# Patient Record
Sex: Female | Born: 1960 | Race: White | Hispanic: No | Smoking: Former smoker
Health system: Southern US, Community
[De-identification: ages and names within clinical notes are randomized; demographics above are authoritative.]

## PROBLEM LIST (undated history)

## (undated) DIAGNOSIS — M797 Fibromyalgia: Secondary | ICD-10-CM

## (undated) DIAGNOSIS — K760 Fatty (change of) liver, not elsewhere classified: Secondary | ICD-10-CM

## (undated) DIAGNOSIS — B009 Herpesviral infection, unspecified: Secondary | ICD-10-CM

## (undated) DIAGNOSIS — G629 Polyneuropathy, unspecified: Secondary | ICD-10-CM

## (undated) DIAGNOSIS — N189 Chronic kidney disease, unspecified: Secondary | ICD-10-CM

## (undated) DIAGNOSIS — I10 Essential (primary) hypertension: Secondary | ICD-10-CM

## (undated) DIAGNOSIS — J449 Chronic obstructive pulmonary disease, unspecified: Secondary | ICD-10-CM

## (undated) DIAGNOSIS — M1990 Unspecified osteoarthritis, unspecified site: Secondary | ICD-10-CM

## (undated) DIAGNOSIS — G4733 Obstructive sleep apnea (adult) (pediatric): Secondary | ICD-10-CM

## (undated) DIAGNOSIS — K219 Gastro-esophageal reflux disease without esophagitis: Secondary | ICD-10-CM

## (undated) DIAGNOSIS — F419 Anxiety disorder, unspecified: Secondary | ICD-10-CM

## (undated) DIAGNOSIS — J45909 Unspecified asthma, uncomplicated: Secondary | ICD-10-CM

## (undated) DIAGNOSIS — J189 Pneumonia, unspecified organism: Secondary | ICD-10-CM

## (undated) DIAGNOSIS — F319 Bipolar disorder, unspecified: Secondary | ICD-10-CM

## (undated) DIAGNOSIS — F329 Major depressive disorder, single episode, unspecified: Secondary | ICD-10-CM

## (undated) DIAGNOSIS — R519 Headache, unspecified: Secondary | ICD-10-CM

## (undated) DIAGNOSIS — F32A Depression, unspecified: Secondary | ICD-10-CM

## (undated) DIAGNOSIS — R0600 Dyspnea, unspecified: Secondary | ICD-10-CM

## (undated) HISTORY — PX: TOOTH EXTRACTION: SHX859

## (undated) HISTORY — PX: HERNIA REPAIR: SHX51

## (undated) HISTORY — PX: BACK SURGERY: SHX140

## (undated) HISTORY — PX: APPENDECTOMY: SHX54

## (undated) HISTORY — PX: TONSILLECTOMY: SUR1361

## (undated) HISTORY — PX: COLONOSCOPY WITH PROPOFOL: SHX5780

## (undated) HISTORY — PX: BREAST REDUCTION SURGERY: SHX8

## (undated) HISTORY — DX: Obstructive sleep apnea (adult) (pediatric): G47.33

## (undated) HISTORY — PX: CARPAL TUNNEL RELEASE: SHX101

## (undated) HISTORY — PX: CHOLECYSTECTOMY: SHX55

## (undated) HISTORY — PX: ABDOMINAL SURGERY: SHX537

## (undated) HISTORY — PX: JOINT REPLACEMENT: SHX530

## (undated) HISTORY — PX: NECK SURGERY: SHX720

## (undated) HISTORY — PX: BREAST SURGERY: SHX581

## (undated) HISTORY — DX: Polyneuropathy, unspecified: G62.9

## (undated) HISTORY — PX: KNEE SURGERY: SHX244

## (undated) HISTORY — PX: UPPER GI ENDOSCOPY: SHX6162

---

## 2006-02-14 ENCOUNTER — Inpatient Hospital Stay (HOSPITAL_COMMUNITY): Admission: AD | Admit: 2006-02-14 | Discharge: 2006-02-14 | Payer: Self-pay | Admitting: Obstetrics and Gynecology

## 2006-11-24 ENCOUNTER — Ambulatory Visit: Payer: Self-pay | Admitting: Physical Medicine & Rehabilitation

## 2006-12-07 ENCOUNTER — Encounter
Admission: RE | Admit: 2006-12-07 | Discharge: 2007-02-16 | Payer: Self-pay | Admitting: Physical Medicine & Rehabilitation

## 2007-01-12 ENCOUNTER — Ambulatory Visit: Payer: Self-pay | Admitting: Physical Medicine & Rehabilitation

## 2011-07-28 ENCOUNTER — Emergency Department (HOSPITAL_COMMUNITY)
Admission: EM | Admit: 2011-07-28 | Discharge: 2011-07-29 | Disposition: A | Discharge status: Home / Self Care | Payer: PRIVATE HEALTH INSURANCE | Attending: Emergency Medicine | Admitting: Emergency Medicine

## 2011-07-28 DIAGNOSIS — I10 Essential (primary) hypertension: Secondary | ICD-10-CM | POA: Insufficient documentation

## 2011-07-28 DIAGNOSIS — IMO0001 Reserved for inherently not codable concepts without codable children: Secondary | ICD-10-CM | POA: Insufficient documentation

## 2011-07-28 DIAGNOSIS — M26609 Unspecified temporomandibular joint disorder, unspecified side: Secondary | ICD-10-CM | POA: Insufficient documentation

## 2011-07-28 DIAGNOSIS — R21 Rash and other nonspecific skin eruption: Secondary | ICD-10-CM | POA: Insufficient documentation

## 2011-07-28 DIAGNOSIS — R51 Headache: Secondary | ICD-10-CM | POA: Insufficient documentation

## 2012-06-18 ENCOUNTER — Encounter (HOSPITAL_COMMUNITY): Payer: Self-pay | Admitting: *Deleted

## 2012-06-18 ENCOUNTER — Emergency Department (HOSPITAL_COMMUNITY): Payer: PRIVATE HEALTH INSURANCE

## 2012-06-18 ENCOUNTER — Emergency Department (HOSPITAL_COMMUNITY)
Admission: EM | Admit: 2012-06-18 | Discharge: 2012-06-18 | Disposition: A | Discharge status: Home Health | Payer: PRIVATE HEALTH INSURANCE | Attending: Emergency Medicine | Admitting: Emergency Medicine

## 2012-06-18 DIAGNOSIS — I10 Essential (primary) hypertension: Secondary | ICD-10-CM | POA: Insufficient documentation

## 2012-06-18 DIAGNOSIS — R0602 Shortness of breath: Secondary | ICD-10-CM | POA: Insufficient documentation

## 2012-06-18 DIAGNOSIS — R110 Nausea: Secondary | ICD-10-CM | POA: Insufficient documentation

## 2012-06-18 DIAGNOSIS — F17200 Nicotine dependence, unspecified, uncomplicated: Secondary | ICD-10-CM | POA: Insufficient documentation

## 2012-06-18 DIAGNOSIS — F341 Dysthymic disorder: Secondary | ICD-10-CM | POA: Insufficient documentation

## 2012-06-18 DIAGNOSIS — R0789 Other chest pain: Secondary | ICD-10-CM

## 2012-06-18 DIAGNOSIS — IMO0001 Reserved for inherently not codable concepts without codable children: Secondary | ICD-10-CM | POA: Insufficient documentation

## 2012-06-18 DIAGNOSIS — R071 Chest pain on breathing: Secondary | ICD-10-CM | POA: Insufficient documentation

## 2012-06-18 DIAGNOSIS — Z79899 Other long term (current) drug therapy: Secondary | ICD-10-CM | POA: Insufficient documentation

## 2012-06-18 HISTORY — DX: Essential (primary) hypertension: I10

## 2012-06-18 HISTORY — DX: Bipolar disorder, unspecified: F31.9

## 2012-06-18 HISTORY — DX: Major depressive disorder, single episode, unspecified: F32.9

## 2012-06-18 HISTORY — DX: Fibromyalgia: M79.7

## 2012-06-18 HISTORY — DX: Anxiety disorder, unspecified: F41.9

## 2012-06-18 HISTORY — DX: Depression, unspecified: F32.A

## 2012-06-18 LAB — CBC
MCH: 32.9 pg (ref 26.0–34.0)
MCHC: 34.7 g/dL (ref 30.0–36.0)
Platelets: 328 10*3/uL (ref 150–400)
RDW: 14.1 % (ref 11.5–15.5)

## 2012-06-18 LAB — BASIC METABOLIC PANEL
CO2: 24 mEq/L (ref 19–32)
GFR calc Af Amer: 69 mL/min — ABNORMAL LOW (ref >90)
Glucose, Bld: 90 mg/dL (ref 70–99)
Potassium: 3.1 mEq/L — ABNORMAL LOW (ref 3.5–5.1)
Sodium: 143 mEq/L (ref 135–145)

## 2012-06-18 LAB — POCT I-STAT TROPONIN I

## 2012-06-18 MED ORDER — OXYCODONE-ACETAMINOPHEN 5-325 MG PO TABS
2.0000 | ORAL_TABLET | Freq: Once | ORAL | Status: AC
  Administered 2012-06-18: 2 via ORAL
  Filled 2012-06-18: qty 2

## 2012-06-18 MED ORDER — OXYCODONE-ACETAMINOPHEN 5-325 MG PO TABS: 1.0000 | ORAL_TABLET | Freq: Four times a day (QID) | ORAL | Status: AC | PRN

## 2012-06-18 NOTE — ED Notes (Signed)
[place] water park today when she started having chest pain in central chest and radiation to left arm.  Patient stated also with sob, and nausea.

## 2012-06-18 NOTE — ED Notes (Signed)
Patient with ASA 324 mg , Nitro x 1 pta per EMS

## 2012-06-18 NOTE — ED Provider Notes (Signed)
History     CSN: [id number]  Arrival date & time 06/18/12  1827   First MD Initiated Contact with Patient 06/18/12 1911      Chief Complaint  Patient presents with  . Chest Pain    (Consider location/radiation/quality/duration/timing/severity/associated sxs/prior treatment) HPI Pt presents with mutiple complaints but primarily shortness of breath, nausea and sharp chest pain.  She states she was at a water park and began to feel sharp shooting pains in her chest as well as feeling more sob than her usual while doing the activities at the water park.  She also c/o multiple other areas of pain in her body including chronic pain in her neck and back as well as legs.  She is requesting that I give her a rx for pain medications when she is ready for discharge.  No fever/chills, no cough.  No leg swelling. No radiation of pain or diaphoresis.  There are no other associated systemic symptoms, there are no other alleviating or modifying factors.   Past Medical History  Diagnosis Date  . Hypertension   . Depression   . Fibromyalgia   . Anxiety   . Manic depression     Past Surgical History  Procedure Date  . Back surgery   . Breast reduction surgery   . Knee surgery     No family history on file.  History  Substance Use Topics  . Smoking status: Current Everyday Smoker  . Smokeless tobacco: Not on file  . Alcohol Use: No    OB History    Grav Para Term Preterm Abortions TAB SAB Ect Mult Living                  Review of Systems ROS reviewed and all otherwise negative except for mentioned in HPI  Allergies  Ambien; Metoprolol; Red dye; Sulfa antibiotics; and Penicillins  Home Medications   Current Outpatient Rx  Name Route Sig Dispense Refill  . ALBUTEROL SULFATE HFA 108 (90 BASE) MCG/ACT IN AERS Inhalation Inhale 2 puffs into the lungs every 6 (six) hours as needed. For wheezing    . XANAX PO Oral Take 1 tablet by mouth 3 (three) times daily as needed. For anxiety      . AMPHETAMINE-DEXTROAMPHETAMINE 30 MG PO TABS Oral Take 30 mg by mouth 2 (two) times daily.    . WELLBUTRIN PO Oral Take 1 tablet by mouth daily.    Marland Kitchen DIAZEPAM 10 MG PO TABS Oral Take 20-30 mg by mouth at bedtime. For sleep    . FLUOXETINE HCL 40 MG PO CAPS Oral Take 40 mg by mouth daily.    Marland Kitchen FLUTICASONE-SALMETEROL 500-50 MCG/DOSE IN AEPB Inhalation Inhale 1 puff into the lungs every 12 (twelve) hours.    Marland Kitchen LAMICTAL PO Oral Take 1 tablet by mouth at bedtime.     . AMITIZA PO Oral Take 1 capsule by mouth daily.    Marland Kitchen METHOTREXATE 2.5 MG PO TABS Oral Take 12 mg by mouth once a week. On Friday. Caution:Chemotherapy. Protect from [place].    . OXYCODONE-ACETAMINOPHEN 10-325 MG PO TABS Oral Take 1 tablet by mouth 4 (four) times daily.    Marland Kitchen PREGABALIN 225 MG PO CAPS Oral Take 225 mg by mouth 2 (two) times daily.    Marland Kitchen PRESCRIPTION MEDICATION Oral Take 1 tablet by mouth daily. Acid Reducer    . PRESCRIPTION MEDICATION Oral Take 1 tablet by mouth daily. Mood Stablizer      BP 124/93  Temp 98.1  F (36.7 C) (Oral)  Resp 20  SpO2 100% Vitals reviewed Physical Exam Physical Examination: General appearance - alert, well appearing, and in no distress Mental status - alert, oriented to person, place, and time Eyes - pupils equal and reactive, no scleral icterus, no conjunctival injection Mouth - mucous membranes moist, pharynx normal without lesions Chest - clear to auscultation, no wheezes, rales or rhonchi, symmetric air entry, reproducible area of pain to the left of sternochostral junction Heart - normal rate, regular rhythm, normal S1, S2, no murmurs, rubs, clicks or gallops Abdomen - soft, nontender, nondistended, no masses or organomegaly Neurological - alert, oriented, normal speech, no focal findings or movement disorder noted Musculoskeletal - no joint tenderness, deformity or swelling Extremities - peripheral pulses normal, no pedal edema, no clubbing or cyanosis Skin - normal coloration  and turgor, no rashes Psych- normal mood and affect  ED Course  Procedures (including critical care time)      Labs Reviewed  BASIC METABOLIC PANEL - Abnormal; Notable for the following:    Potassium 3.1 (*)     GFR calc non Af Amer 59 (*)     GFR calc Af Amer 69 (*)     All other components within normal limits  CBC  POCT I-STAT TROPONIN I  POCT I-STAT TROPONIN I   Dg Chest 2 View  06/18/2012  *RADIOLOGY REPORT*  Clinical Data: Chest pain, short of breath, dizziness, smoking history  CHEST - 2 VIEW  Comparison: None.  Findings: No active infiltrate or effusion is seen.  Mediastinal contours appear normal.  The heart is within normal limits in size. No bony abnormality is seen. A lower anterior cervical spine fusion plate is present.  IMPRESSION: No active lung disease.  Original Report Authenticated By: Juline Patch, M.D.     No diagnosis found.    MDM  Pt with multiple complaints including sharp chest pain.  EKG reviewed and reassuring, 2 sets of tropinin negative, low suspicion for ACS, PERC 0, CXR also reassuring.  Discharged with strict return precautions.  Pt agreeable with plan.       Ethelda Chick, MD 06/23/12 [phone]

## 2015-06-11 DIAGNOSIS — F603 Borderline personality disorder: Secondary | ICD-10-CM | POA: Insufficient documentation

## 2015-07-17 DIAGNOSIS — Z9889 Other specified postprocedural states: Secondary | ICD-10-CM | POA: Insufficient documentation

## 2015-08-20 DIAGNOSIS — G9332 Myalgic encephalomyelitis/chronic fatigue syndrome: Secondary | ICD-10-CM | POA: Insufficient documentation

## 2015-08-20 DIAGNOSIS — D8989 Other specified disorders involving the immune mechanism, not elsewhere classified: Secondary | ICD-10-CM | POA: Insufficient documentation

## 2015-08-20 DIAGNOSIS — I10 Essential (primary) hypertension: Secondary | ICD-10-CM | POA: Insufficient documentation

## 2015-08-20 DIAGNOSIS — J441 Chronic obstructive pulmonary disease with (acute) exacerbation: Secondary | ICD-10-CM | POA: Insufficient documentation

## 2015-08-20 DIAGNOSIS — M79673 Pain in unspecified foot: Secondary | ICD-10-CM | POA: Insufficient documentation

## 2015-08-20 DIAGNOSIS — R5382 Chronic fatigue, unspecified: Secondary | ICD-10-CM

## 2015-08-20 DIAGNOSIS — G479 Sleep disorder, unspecified: Secondary | ICD-10-CM | POA: Insufficient documentation

## 2015-08-20 DIAGNOSIS — G4733 Obstructive sleep apnea (adult) (pediatric): Secondary | ICD-10-CM | POA: Insufficient documentation

## 2015-08-20 DIAGNOSIS — M47812 Spondylosis without myelopathy or radiculopathy, cervical region: Secondary | ICD-10-CM | POA: Insufficient documentation

## 2015-08-20 DIAGNOSIS — J449 Chronic obstructive pulmonary disease, unspecified: Secondary | ICD-10-CM | POA: Insufficient documentation

## 2015-10-02 ENCOUNTER — Ambulatory Visit (INDEPENDENT_AMBULATORY_CARE_PROVIDER_SITE_OTHER): Payer: 59 | Admitting: Psychiatry

## 2015-10-02 ENCOUNTER — Encounter (HOSPITAL_COMMUNITY): Payer: Self-pay | Admitting: Psychiatry

## 2015-10-02 VITALS — BP 120/72 | HR 99 | Ht 66.0 in | Wt 195.0 lb

## 2015-10-02 DIAGNOSIS — G8929 Other chronic pain: Secondary | ICD-10-CM | POA: Diagnosis not present

## 2015-10-02 DIAGNOSIS — F4310 Post-traumatic stress disorder, unspecified: Secondary | ICD-10-CM

## 2015-10-02 DIAGNOSIS — F3130 Bipolar disorder, current episode depressed, mild or moderate severity, unspecified: Secondary | ICD-10-CM | POA: Diagnosis not present

## 2015-10-02 DIAGNOSIS — F411 Generalized anxiety disorder: Secondary | ICD-10-CM

## 2015-10-02 MED ORDER — FLUOXETINE HCL 40 MG PO CAPS: 40.0000 mg | ORAL_CAPSULE | Freq: Every day | ORAL | Status: DC

## 2015-10-02 MED ORDER — BUPROPION HCL ER (XL) 300 MG PO TB24: 300.0000 mg | ORAL_TABLET | ORAL | Status: DC
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
## 2015-10-16 ENCOUNTER — Ambulatory Visit (HOSPITAL_COMMUNITY): Payer: Self-pay | Admitting: Licensed Clinical Social Worker

## 2015-11-03 ENCOUNTER — Ambulatory Visit (HOSPITAL_COMMUNITY): Payer: 59 | Admitting: Psychiatry

## 2016-04-29 ENCOUNTER — Other Ambulatory Visit (HOSPITAL_COMMUNITY): Payer: Self-pay | Admitting: Psychiatry

## 2017-01-10 ENCOUNTER — Ambulatory Visit (INDEPENDENT_AMBULATORY_CARE_PROVIDER_SITE_OTHER): Payer: 59 | Admitting: Psychiatry

## 2017-01-10 ENCOUNTER — Encounter (HOSPITAL_COMMUNITY): Payer: Self-pay | Admitting: Psychiatry

## 2017-01-10 VITALS — BP 142/84 | HR 86 | Resp 18 | Ht 64.0 in | Wt 197.0 lb

## 2017-01-10 DIAGNOSIS — F3130 Bipolar disorder, current episode depressed, mild or moderate severity, unspecified: Secondary | ICD-10-CM

## 2017-01-10 DIAGNOSIS — F411 Generalized anxiety disorder: Secondary | ICD-10-CM

## 2017-01-10 DIAGNOSIS — F4310 Post-traumatic stress disorder, unspecified: Secondary | ICD-10-CM

## 2017-01-10 DIAGNOSIS — G8929 Other chronic pain: Secondary | ICD-10-CM | POA: Diagnosis not present

## 2017-01-10 DIAGNOSIS — F17210 Nicotine dependence, cigarettes, uncomplicated: Secondary | ICD-10-CM

## 2017-01-10 DIAGNOSIS — Z811 Family history of alcohol abuse and dependence: Secondary | ICD-10-CM

## 2017-01-10 DIAGNOSIS — Z818 Family history of other mental and behavioral disorders: Secondary | ICD-10-CM

## 2017-01-10 DIAGNOSIS — Z9889 Other specified postprocedural states: Secondary | ICD-10-CM

## 2017-01-10 MED ORDER — CITALOPRAM HYDROBROMIDE 20 MG PO TABS: 20.0000 mg | ORAL_TABLET | Freq: Every day | ORAL | 0 refills | Status: DC
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
## 2017-01-31 ENCOUNTER — Ambulatory Visit (HOSPITAL_COMMUNITY): Payer: Self-pay | Admitting: Psychiatry

## 2017-02-05 ENCOUNTER — Emergency Department (HOSPITAL_COMMUNITY): Payer: Medicare Other

## 2017-02-05 ENCOUNTER — Encounter (HOSPITAL_COMMUNITY): Payer: Self-pay | Admitting: Emergency Medicine

## 2017-02-05 ENCOUNTER — Emergency Department (HOSPITAL_COMMUNITY)
Admission: EM | Admit: 2017-02-05 | Discharge: 2017-02-05 | Disposition: A | Discharge status: Home / Self Care | Payer: Medicare Other | Attending: Emergency Medicine | Admitting: Emergency Medicine

## 2017-02-05 DIAGNOSIS — Z91040 Latex allergy status: Secondary | ICD-10-CM | POA: Diagnosis not present

## 2017-02-05 DIAGNOSIS — F17210 Nicotine dependence, cigarettes, uncomplicated: Secondary | ICD-10-CM | POA: Diagnosis not present

## 2017-02-05 DIAGNOSIS — J441 Chronic obstructive pulmonary disease with (acute) exacerbation: Secondary | ICD-10-CM | POA: Diagnosis not present

## 2017-02-05 DIAGNOSIS — J189 Pneumonia, unspecified organism: Secondary | ICD-10-CM

## 2017-02-05 DIAGNOSIS — Z79899 Other long term (current) drug therapy: Secondary | ICD-10-CM | POA: Diagnosis not present

## 2017-02-05 DIAGNOSIS — J181 Lobar pneumonia, unspecified organism: Secondary | ICD-10-CM | POA: Diagnosis not present

## 2017-02-05 DIAGNOSIS — R0602 Shortness of breath: Secondary | ICD-10-CM | POA: Diagnosis present

## 2017-02-05 DIAGNOSIS — I10 Essential (primary) hypertension: Secondary | ICD-10-CM | POA: Insufficient documentation

## 2017-02-05 LAB — BASIC METABOLIC PANEL
Anion gap: 9 (ref 5–15)
BUN: 11 mg/dL (ref 6–20)
CO2: 22 mmol/L (ref 22–32)
CREATININE: 0.94 mg/dL (ref 0.44–1.00)
Calcium: 8.4 mg/dL — ABNORMAL LOW (ref 8.9–10.3)
Chloride: 109 mmol/L (ref 101–111)
GFR calc Af Amer: >60 mL/min (ref >60)
Glucose, Bld: 129 mg/dL — ABNORMAL HIGH (ref 65–99)
Potassium: 3.5 mmol/L (ref 3.5–5.1)
SODIUM: 140 mmol/L (ref 135–145)

## 2017-02-05 LAB — CBC WITH DIFFERENTIAL/PLATELET
Basophils Absolute: 0 10*3/uL (ref 0.0–0.1)
Basophils Relative: 0 %
EOS ABS: 0.3 10*3/uL (ref 0.0–0.7)
EOS PCT: 3 %
HCT: 44.7 % (ref 36.0–46.0)
Hemoglobin: 14.8 g/dL (ref 12.0–15.0)
LYMPHS ABS: 2.4 10*3/uL (ref 0.7–4.0)
Lymphocytes Relative: 21 %
MCH: 29.7 pg (ref 26.0–34.0)
MCHC: 33.1 g/dL (ref 30.0–36.0)
MCV: 89.8 fL (ref 78.0–100.0)
MONOS PCT: 4 %
Monocytes Absolute: 0.5 10*3/uL (ref 0.1–1.0)
Neutro Abs: 8.4 10*3/uL — ABNORMAL HIGH (ref 1.7–7.7)
Neutrophils Relative %: 72 %
PLATELETS: 300 10*3/uL (ref 150–400)
RBC: 4.98 MIL/uL (ref 3.87–5.11)
RDW: 13.8 % (ref 11.5–15.5)
WBC: 11.6 10*3/uL — ABNORMAL HIGH (ref 4.0–10.5)

## 2017-02-05 MED ORDER — ALBUTEROL SULFATE (2.5 MG/3ML) 0.083% IN NEBU
5.0000 mg | INHALATION_SOLUTION | Freq: Once | RESPIRATORY_TRACT | Status: AC
  Administered 2017-02-05: 5 mg via RESPIRATORY_TRACT
  Filled 2017-02-05: qty 6

## 2017-02-05 MED ORDER — AZITHROMYCIN 250 MG PO TABS: 250.0000 mg | ORAL_TABLET | Freq: Every day | ORAL | 0 refills | Status: DC

## 2017-02-05 MED ORDER — AZITHROMYCIN 250 MG PO TABS
500.0000 mg | ORAL_TABLET | Freq: Once | ORAL | Status: AC
  Administered 2017-02-05: 500 mg via ORAL
  Filled 2017-02-05: qty 2

## 2017-02-05 NOTE — ED Provider Notes (Signed)
[facility]-EMERGENCY DEPT Provider Note   CSN: [id number] Arrival date & time: 02/05/17  [id number]     History   Chief Complaint Chief Complaint  Patient presents with  . Shortness of Breath    HPI Deanna Yang is a 56 y.o. female.  Patient with history of asthma and COPD followed by [facility] [place]. Patient reports worsened control of her symptoms since October 2017. She is on multiple breathing medications at home as well as 2 L oxygen that she uses when she is lying down. These have recently been adjusted. She was treated for bronchitis several weeks ago with antibiotics and steroid taper. Patient was followed up and is currently in the middle of another steroid taper. Patient woke this morning with shortness of breath and wheezing. She used her home albuterol without relief. EMS was called. They gave 2 additional treatments and 125 mg of Solumedrol IV. Patient reports feeling improved. She does not have any chest pain, [place] headedness, or syncope. No reported history of blood clots. No history of heart attack or stents. No fever, URI sx.   Reviewed recent pulm notes. Admission for resp failure after knee surgery in 10/17 thought related to accidental narcotics overdose.       Past Medical History:  Diagnosis Date  . Anxiety   . Depression   . Fibromyalgia   . Hypertension   . Hypertension   . Manic depression (HCC)   . Neuropathy (HCC)   . OSA (obstructive sleep apnea)     Patient Active Problem List   Diagnosis Date Noted  . CFIDS (chronic fatigue and immune dysfunction syndrome) (HCC) 08/20/2015  . Foot pain 08/20/2015  . Essential (primary) hypertension 08/20/2015  . Asthma, mild persistent 08/20/2015  . Obstructive apnea 08/20/2015  . Cervical osteoarthritis 08/20/2015  . Sleep disorder 08/20/2015  . Other specified postprocedural states 07/17/2015  . Borderline personality disorder 06/11/2015    Past Surgical History:  Procedure Laterality Date  .  ABDOMINAL SURGERY    . APPENDECTOMY    . BACK SURGERY    . BREAST REDUCTION SURGERY    . BREAST SURGERY    . CARPAL TUNNEL RELEASE Bilateral   . CESAREAN SECTION    . CHOLECYSTECTOMY    . HERNIA REPAIR    . JOINT REPLACEMENT    . KNEE SURGERY    . TONSILLECTOMY      OB History    No data available       Home Medications    Prior to Admission medications   Medication Sig Start Date End Date Taking? Authorizing Provider  albuterol (PROVENTIL HFA;VENTOLIN HFA) 108 (90 BASE) MCG/ACT inhaler Inhale 2 puffs into the lungs every 6 (six) hours as needed. For wheezing    Historical Provider, MD  amLODipine (NORVASC) 5 MG tablet Take by mouth. 11/10/16   Historical Provider, MD  citalopram (CELEXA) 20 MG tablet Take 1 tablet (20 mg total) by mouth daily. Start with half a tablet a day for first week then one a day 01/10/17 01/10/18  Thresa RossNadeem Akhtar, MD  Fluticasone-Salmeterol (ADVAIR) 500-50 MCG/DOSE AEPB Inhale 1 puff into the lungs every 12 (twelve) hours.    Historical Provider, MD  Gabapentin, Once-Daily, 300 MG TABS Take by mouth. 01/06/17 04/06/17  Historical Provider, MD  hydrOXYzine (VISTARIL) 25 MG capsule One cap up to every 6 hours for panic attacks 12/19/16   Historical Provider, MD  LamoTRIgine (LAMICTAL PO) Take 1 tablet by mouth at bedtime.     Historical  Provider, MD  oxyCODONE-acetaminophen (PERCOCET) 10-325 MG per tablet Take 1 tablet by mouth 4 (four) times daily.    Historical Provider, MD  PRESCRIPTION MEDICATION Take 1 tablet by mouth daily. Acid Reducer    Historical Provider, MD  PRESCRIPTION MEDICATION Take 1 tablet by mouth daily. Mood Stablizer    Historical Provider, MD    Family History Family History  Problem Relation Age of Onset  . Alcohol abuse Mother   . Alcohol abuse Father   . Depression Sister   . Dementia Neg Hx     Social History Social History  Substance Use Topics  . Smoking status: Current Every Day Smoker    Types: Cigarettes  . Smokeless  tobacco: Never Used  . Alcohol use No     Allergies   Ambien [zolpidem tartrate]; Beta adrenergic blockers; Fish allergy; Iodinated diagnostic agents; Latex; Metoprolol; Penicillins; Red dye; Shellfish allergy; Sulfa antibiotics; Dye fdc red  [amaranth (fd&c red #2)]; Other; Tape; Levofloxacin; and Seroquel  [quetiapine fumarate]   Review of Systems Review of Systems  Constitutional: Negative for fever.  HENT: Negative for rhinorrhea and sore throat.   Eyes: Negative for redness.  Respiratory: Positive for cough, shortness of breath and wheezing.   Cardiovascular: Negative for chest pain.  Gastrointestinal: Negative for abdominal pain, diarrhea, nausea and vomiting.  Genitourinary: Negative for dysuria.  Musculoskeletal: Negative for myalgias.  Skin: Negative for rash.  Neurological: Negative for headaches.     Physical Exam Updated Vital Signs BP 142/83   Pulse 82   Temp 97.8 F (36.6 C) (Oral)   Resp 18   Ht 5\' 7"  (1.702 m)   Wt 87.5 kg   SpO2 98%   BMI 30.23 kg/m   Physical Exam  Constitutional: She appears well-developed and well-nourished.  HENT:  Head: Normocephalic and atraumatic.  Nose: Nose normal.  Mouth/Throat: Oropharynx is clear and moist.  Eyes: Conjunctivae are normal. Right eye exhibits no discharge. Left eye exhibits no discharge.  Neck: Normal range of motion. Neck supple.  Cardiovascular: Normal rate, regular rhythm and normal heart sounds.   No murmur heard. Pulmonary/Chest: Effort normal. No respiratory distress. She has wheezes (Mild, end expiratory, worse at bases). She has no rales. She exhibits no tenderness.  Abdominal: Soft. There is no tenderness.  Neurological: She is alert.  Skin: Skin is warm and dry.  Psychiatric: She has a normal mood and affect.  Nursing note and vitals reviewed.    ED Treatments / Results  Labs (all labs ordered are listed, but only abnormal results are displayed) Labs Reviewed  CBC WITH  DIFFERENTIAL/PLATELET - Abnormal; Notable for the following:       Result Value   WBC 11.6 (*)    Neutro Abs 8.4 (*)    All other components within normal limits  BASIC METABOLIC PANEL - Abnormal; Notable for the following:    Glucose, Bld 129 (*)    Calcium 8.4 (*)    All other components within normal limits    EKG  EKG Interpretation  Date/Time:  Sunday February 05 2017 07:02:27 EST Ventricular Rate:  83 PR Interval:    QRS Duration: 105 QT Interval:  412 QTC Calculation: 485 R Axis:   73 Text Interpretation:  Sinus rhythm Short PR interval Borderline ST depression, diffuse leads No previous ECGs available Abnormal ECG Confirmed by Bebe Shaggy  MD, DONALD ([id number]) on 02/05/2017 7:06:48 AM       Radiology No results found.  Procedures Procedures (including critical care time)  Medications Ordered in ED Medications  albuterol (PROVENTIL) (2.5 MG/3ML) 0.083% nebulizer solution 5 mg (5 mg Nebulization Given 02/05/17 0740)  azithromycin (ZITHROMAX) tablet 500 mg (500 mg Oral Given 02/05/17 0847)     Initial Impression / Assessment and Plan / ED Course  I have reviewed the triage vital signs and the nursing notes.  Pertinent labs & imaging results that were available during my care of the patient were reviewed by me and considered in my medical decision making (see chart for details).     Patient seen and examined. Work-up initiated. Medications ordered.   Vital signs reviewed and are as follows: BP 142/83   Pulse 82   Temp 97.8 F (36.6 C) (Oral)   Resp 18   Ht 5\' 7"  (1.702 m)   Wt 87.5 kg   SpO2 98%   BMI 30.23 kg/m   9:13 AM patient updated on results. Wheezing now resolved. Patient will be treated for community-acquired pneumonia.  She is stable for discharge. Encouraged to follow-up with her pulmonologist in the next 5 days for recheck. Encouraged return to the emergency department with high persistent fever, worsening shortness of breath, worsening wheezing,  increased work of breathing, or other concerns. She verbalizes understanding and agrees with plan.  Final Clinical Impressions(s) / ED Diagnoses   Final diagnoses:  Community acquired pneumonia of right lower lobe of lung (HCC)  COPD exacerbation (HCC)   Patient with underlying asthma and COPD, poorly controlled as of late, medications being adjusted by her pulmonologist in [place] -- presents with worsening wheezing and shortness of breath not responsive to home medications. This was improved with treatment by EMS and in emergency department. Chest x-ray reveals right basilar pneumonia. Vital signs are within normal limits. Patient has a mild leukocytosis likely related to infection as well as recent steroid use. She is not hypoxic. Patient is feeling better and is stable for discharge. She has appropriate follow-up and wants to go home.   New Prescriptions New Prescriptions   AZITHROMYCIN (ZITHROMAX) 250 MG TABLET    Take 1 tablet (250 mg total) by mouth daily.     Renne Crigler, PA-C 02/05/17 1610    Lyndal Pulley, MD 02/06/17 [phone]

## 2017-02-05 NOTE — Discharge Instructions (Signed)
Please read and follow all provided instructions.  Your diagnoses today include:  1. Community acquired pneumonia of right lower lobe of lung (HCC)   2. COPD exacerbation (HCC)     Tests performed today include:  Blood counts and electrolytes  Chest x-ray -- shows pneumonia in the right lower lung  Vital signs. See below for your results today.   Medications prescribed:   Azithromycin - antibiotic for respiratory infection  You have been prescribed an antibiotic medicine: take the entire course of medicine even if you are feeling better. Stopping early can cause the antibiotic not to work.  Take any prescribed medications only as directed.  Home care instructions:  Follow any educational materials contained in this packet.  Continue steroids prescribed by your lung doctor.   Follow-up instructions: Please follow-up with your primary care provider in the next 3 days for further evaluation of your symptoms and to ensure resolution of your infection.   Return instructions:   Please return to the Emergency Department if you experience worsening symptoms.   Return immediately with worsening breathing, worsening shortness of breath, or if you feel it is taking you more effort to breathe.   Please return if you have any other emergent concerns.  Additional Information:  Your vital signs today were: BP 134/88    Pulse 78    Temp 97.8 F (36.6 C) (Oral)    Resp 17    Ht 5\' 7"  (1.702 m)    Wt 87.5 kg    SpO2 94%    BMI 30.23 kg/m  If your blood pressure (BP) was elevated above 135/85 this visit, please have this repeated by your doctor within one month. --------------

## 2017-02-05 NOTE — ED Notes (Signed)
Patient at [facility]

## 2017-02-05 NOTE — ED Triage Notes (Signed)
Brought by home after waking up SOB.  Hx of copd.  Took 2 neb treatments at home with no relief.  Given 2 duonebs and solumedrol 125mg  IV enroute.

## 2017-02-06 ENCOUNTER — Ambulatory Visit (HOSPITAL_COMMUNITY): Payer: Self-pay | Admitting: Licensed Clinical Social Worker

## 2017-04-05 ENCOUNTER — Emergency Department (HOSPITAL_COMMUNITY): Payer: Medicare Other

## 2017-04-05 ENCOUNTER — Emergency Department (HOSPITAL_COMMUNITY)
Admission: EM | Admit: 2017-04-05 | Discharge: 2017-04-05 | Disposition: A | Discharge status: Home / Self Care | Payer: Medicare Other | Attending: Emergency Medicine | Admitting: Emergency Medicine

## 2017-04-05 ENCOUNTER — Encounter (HOSPITAL_COMMUNITY): Payer: Self-pay | Admitting: Emergency Medicine

## 2017-04-05 DIAGNOSIS — F17210 Nicotine dependence, cigarettes, uncomplicated: Secondary | ICD-10-CM | POA: Diagnosis not present

## 2017-04-05 DIAGNOSIS — R0602 Shortness of breath: Secondary | ICD-10-CM

## 2017-04-05 DIAGNOSIS — J441 Chronic obstructive pulmonary disease with (acute) exacerbation: Secondary | ICD-10-CM | POA: Insufficient documentation

## 2017-04-05 DIAGNOSIS — Z79899 Other long term (current) drug therapy: Secondary | ICD-10-CM | POA: Diagnosis not present

## 2017-04-05 DIAGNOSIS — J45909 Unspecified asthma, uncomplicated: Secondary | ICD-10-CM | POA: Diagnosis not present

## 2017-04-05 DIAGNOSIS — I10 Essential (primary) hypertension: Secondary | ICD-10-CM | POA: Insufficient documentation

## 2017-04-05 HISTORY — DX: Chronic obstructive pulmonary disease, unspecified: J44.9

## 2017-04-05 LAB — BASIC METABOLIC PANEL
ANION GAP: 8 (ref 5–15)
CHLORIDE: 108 mmol/L (ref 101–111)
CO2: 23 mmol/L (ref 22–32)
Calcium: 8.3 mg/dL — ABNORMAL LOW (ref 8.9–10.3)
Creatinine, Ser: 0.88 mg/dL (ref 0.44–1.00)
GFR calc Af Amer: >60 mL/min (ref >60)
GFR calc non Af Amer: >60 mL/min (ref >60)
GLUCOSE: 139 mg/dL — AB (ref 65–99)
POTASSIUM: 3.4 mmol/L — AB (ref 3.5–5.1)
Sodium: 139 mmol/L (ref 135–145)

## 2017-04-05 LAB — CBC WITH DIFFERENTIAL/PLATELET
BASOS ABS: 0.1 10*3/uL (ref 0.0–0.1)
Basophils Relative: 0 %
EOS PCT: 1 %
Eosinophils Absolute: 0.1 10*3/uL (ref 0.0–0.7)
HEMATOCRIT: 40.9 % (ref 36.0–46.0)
Hemoglobin: 13.7 g/dL (ref 12.0–15.0)
LYMPHS ABS: 1.1 10*3/uL (ref 0.7–4.0)
LYMPHS PCT: 9 %
MCH: 29.8 pg (ref 26.0–34.0)
MCHC: 33.5 g/dL (ref 30.0–36.0)
MCV: 89.1 fL (ref 78.0–100.0)
MONO ABS: 0.2 10*3/uL (ref 0.1–1.0)
Monocytes Relative: 1 %
NEUTROS ABS: 11.2 10*3/uL — AB (ref 1.7–7.7)
Neutrophils Relative %: 89 %
PLATELETS: 315 10*3/uL (ref 150–400)
RBC: 4.59 MIL/uL (ref 3.87–5.11)
RDW: 14.4 % (ref 11.5–15.5)
WBC: 12.6 10*3/uL — ABNORMAL HIGH (ref 4.0–10.5)

## 2017-04-05 MED ORDER — PREDNISONE 20 MG PO TABS: 40.0000 mg | ORAL_TABLET | Freq: Every day | ORAL | 0 refills | Status: DC

## 2017-04-05 MED ORDER — IPRATROPIUM BROMIDE 0.02 % IN SOLN
0.5000 mg | Freq: Once | RESPIRATORY_TRACT | Status: AC
Start: 2017-04-05 — End: 2017-04-05
  Administered 2017-04-05: 0.5 mg via RESPIRATORY_TRACT
  Filled 2017-04-05: qty 2.5

## 2017-04-05 MED ORDER — ALBUTEROL SULFATE (2.5 MG/3ML) 0.083% IN NEBU
5.0000 mg | INHALATION_SOLUTION | Freq: Once | RESPIRATORY_TRACT | Status: AC
  Administered 2017-04-05: 5 mg via RESPIRATORY_TRACT
  Filled 2017-04-05: qty 6

## 2017-04-05 MED ORDER — ONDANSETRON HCL 4 MG/2ML IJ SOLN
4.0000 mg | Freq: Once | INTRAMUSCULAR | Status: AC
  Administered 2017-04-05: 4 mg via INTRAVENOUS
  Filled 2017-04-05: qty 2

## 2017-04-05 NOTE — ED Notes (Signed)
Pt voices understanding of discharge instructions and importance of follow up. NAD. A/o x4. Ambulatory at departure without distress. Wheeled to waiting room to transportation.

## 2017-04-05 NOTE — ED Triage Notes (Addendum)
Patient from home via [facility] complaing of SOB. Patient has hx of COPD, asthma, and sleeps with 3L of oxygen. Patient awoke with SOB and checked her pulse oximetry at home and it was 71%. Patient used her own duo neb, and inhaler before EMS arrived with no relief. In route to facility patient received 1 mg of Atrovent, 10 mg of albuterol, and 125 mg of solumedrol. Patient has been off prednisone for 1 week. Patient still has expiratory wheezing on arrival, EMS reports it has improved since first treatment. Patient also reports leg, arm, and back spasms.

## 2017-04-05 NOTE — ED Notes (Signed)
Ambulated patient w/o oxygen. Patient oxygen saturation was 88%-89% while ambulating. EDP made aware.

## 2017-04-05 NOTE — ED Provider Notes (Signed)
[facility]-EMERGENCY DEPT Provider Note   CSN: [id number] Arrival date & time: 04/05/17  4098     History   Chief Complaint Chief Complaint  Patient presents with  . Shortness of Breath    HPI Deanna Yang is a 56 y.o. female.  Deanna Yang is a 56 y.o. Female with a history of asthma, COPD and smoking who presents to the ED complaining of shortness of breath starting today. Patient reports she awoke with shortness of breath and chest tightness and checked her oxygen saturation at home and noted it to be 71%. She reports doing her breathing treatments before EMS arrived and her oxygen saturations improved greatly. She received a milligram of Atrovent, 10 mg albuterol and 125 mg of Solu-Medrol by EMS in route. Patient tells me she is currently feeling much better. She no longer feels short of breath. She denies having any chest pain today. She reports recent allergens in the home including a new pet. She complains of some nausea without vomiting after breathing treatments. She denies fevers, chest pain, hemoptysis, leg pain, leg swelling, vomiting, abdominal pain, lightheadedness, syncope or rashes.   The history is provided by the patient, medical records and a relative. No language interpreter was used.  Shortness of Breath  Associated symptoms include wheezing. Pertinent negatives include no fever, no headaches, no sore throat, no neck pain, no cough, no chest pain, no vomiting, no abdominal pain, no rash and no leg swelling.    Past Medical History:  Diagnosis Date  . Anxiety   . COPD (chronic obstructive pulmonary disease) (HCC)   . Depression   . Fibromyalgia   . Hypertension   . Hypertension   . Manic depression (HCC)   . Neuropathy   . OSA (obstructive sleep apnea)     Patient Active Problem List   Diagnosis Date Noted  . CFIDS (chronic fatigue and immune dysfunction syndrome) (HCC) 08/20/2015  . Foot pain 08/20/2015  . Essential (primary) hypertension 08/20/2015  .  Asthma, mild persistent 08/20/2015  . Obstructive apnea 08/20/2015  . Cervical osteoarthritis 08/20/2015  . Sleep disorder 08/20/2015  . Other specified postprocedural states 07/17/2015  . Borderline personality disorder 06/11/2015    Past Surgical History:  Procedure Laterality Date  . ABDOMINAL SURGERY    . APPENDECTOMY    . BACK SURGERY    . BREAST REDUCTION SURGERY    . BREAST SURGERY    . CARPAL TUNNEL RELEASE Bilateral   . CESAREAN SECTION    . CHOLECYSTECTOMY    . HERNIA REPAIR    . JOINT REPLACEMENT    . KNEE SURGERY    . TONSILLECTOMY      OB History    No data available       Home Medications    Prior to Admission medications   Medication Sig Start Date End Date Taking? Authorizing Provider  albuterol (PROVENTIL HFA;VENTOLIN HFA) 108 (90 BASE) MCG/ACT inhaler Inhale 2 puffs into the lungs every 6 (six) hours as needed for wheezing.     Historical Provider, MD  amLODipine (NORVASC) 5 MG tablet Take by mouth. 11/10/16   Historical Provider, MD  azithromycin (ZITHROMAX) 250 MG tablet Take 1 tablet (250 mg total) by mouth daily. 02/05/17   Renne Crigler, PA-C  citalopram (CELEXA) 20 MG tablet Take 1 tablet (20 mg total) by mouth daily. Start with half a tablet a day for first week then one a day 01/10/17 01/10/18  Thresa Ross, MD  Fluticasone-Salmeterol (ADVAIR) 500-50 MCG/DOSE AEPB Inhale  1 puff into the lungs every 12 (twelve) hours.    Historical Provider, MD  Gabapentin, Once-Daily, 300 MG TABS Take by mouth. 01/06/17 04/06/17  Historical Provider, MD  hydrOXYzine (VISTARIL) 25 MG capsule One cap up to every 6 hours for panic attacks 12/19/16   Historical Provider, MD  LamoTRIgine (LAMICTAL PO) Take 1 tablet by mouth at bedtime.     Historical Provider, MD  oxyCODONE-acetaminophen (PERCOCET) 10-325 MG per tablet Take 1 tablet by mouth 4 (four) times daily.    Historical Provider, MD  predniSONE (DELTASONE) 20 MG tablet Take 2 tablets (40 mg total) by mouth daily.  04/05/17   Everlene Farrier, PA-C  PRESCRIPTION MEDICATION Take 1 tablet by mouth daily. Acid Reducer    Historical Provider, MD  PRESCRIPTION MEDICATION Take 1 tablet by mouth daily. Mood Stablizer    Historical Provider, MD    Family History Family History  Problem Relation Age of Onset  . Alcohol abuse Mother   . Alcohol abuse Father   . Depression Sister   . Dementia Neg Hx     Social History Social History  Substance Use Topics  . Smoking status: Current Every Day Smoker    Packs/day: 1.00    Types: Cigarettes  . Smokeless tobacco: Never Used  . Alcohol use No     Allergies   Ambien [zolpidem tartrate]; Beta adrenergic blockers; Fish allergy; Iodinated diagnostic agents; Latex; Metoprolol; Penicillins; Red dye; Shellfish allergy; Sulfa antibiotics; Dye fdc red  [amaranth (fd&c red #2)]; Other; Tape; Levofloxacin; and Seroquel [quetiapine fumarate]   Review of Systems Review of Systems  Constitutional: Negative for chills and fever.  HENT: Negative for congestion and sore throat.   Eyes: Negative for visual disturbance.  Respiratory: Positive for chest tightness, shortness of breath and wheezing. Negative for cough.   Cardiovascular: Negative for chest pain, palpitations and leg swelling.  Gastrointestinal: Negative for abdominal pain, diarrhea, nausea and vomiting.  Genitourinary: Negative for dysuria.  Musculoskeletal: Negative for back pain and neck pain.  Skin: Negative for rash.  Neurological: Negative for syncope, weakness, Mezera-headedness and headaches.     Physical Exam Updated Vital Signs BP 120/77   Pulse 85   Temp 98.5 F (36.9 C) (Oral)   Resp 17   Ht  (1.702 m)   Wt 90.7 kg   SpO2 91%   BMI 31.32 kg/m   Physical Exam  Constitutional: She appears well-developed and well-nourished. No distress.  Nontoxic appearing.  HENT:  Head: Normocephalic and atraumatic.  Right Ear: External ear normal.  Left Ear: External ear normal.    Mouth/Throat: Oropharynx is clear and moist.  Eyes: Conjunctivae are normal. Pupils are equal, round, and reactive to Pettie. Right eye exhibits no discharge. Left eye exhibits no discharge.  Neck: Neck supple. No JVD present.  Cardiovascular: Normal rate, regular rhythm, normal heart sounds and intact distal pulses.  Exam reveals no gallop and no friction rub.   No murmur heard. Pulmonary/Chest: Effort normal. No stridor. No respiratory distress. She has wheezes. She has no rales.  Slight expiratory wheezes noted bilaterally. No increased work of breathing. No rales or rhonchi. Oxygen saturation 93% on room air.  Abdominal: Soft. There is no tenderness. There is no guarding.  Musculoskeletal: She exhibits no edema or tenderness.  No lower extremity edema or tenderness.  Lymphadenopathy:    She has no cervical adenopathy.  Neurological: She is alert. Coordination normal.  Skin: Skin is warm and dry. Capillary refill takes less than 2  seconds. No rash noted. She is not diaphoretic. No erythema. No pallor.  Psychiatric: She has a normal mood and affect. Her behavior is normal.  Nursing note and vitals reviewed.    ED Treatments / Results  Labs (all labs ordered are listed, but only abnormal results are displayed) Labs Reviewed  BASIC METABOLIC PANEL - Abnormal; Notable for the following:       Result Value   Potassium 3.4 (*)    Glucose, Bld 139 (*)    BUN <5 (*)    Calcium 8.3 (*)    All other components within normal limits  CBC WITH DIFFERENTIAL/PLATELET - Abnormal; Notable for the following:    WBC 12.6 (*)    Neutro Abs 11.2 (*)    All other components within normal limits    EKG  EKG Interpretation  Date/Time:  Wednesday April 05 2017 08:52:43 EDT Ventricular Rate:  80 PR Interval:    QRS Duration: 86 QT Interval:  386 QTC Calculation: 446 R Axis:   68 Text Interpretation:  Sinus rhythm No significant change since last tracing Confirmed by LITTLE MD, RACHEL [phone])  on 04/05/2017 9:35:26 AM       Radiology Dg Chest 2 View  Result Date: 04/05/2017 CLINICAL DATA:  Shortness of Breath EXAM: CHEST  2 VIEW COMPARISON:  February 05, 2017 FINDINGS: There is no edema or consolidation. Heart size and pulmonary vascularity are normal. No adenopathy. There is postoperative change in the lower cervical spine. IMPRESSION: No edema or consolidation. Electronically Signed   By: Bretta Bang III M.D.   On: 04/05/2017 09:46    Procedures Procedures (including critical care time)  Medications Ordered in ED Medications  ondansetron (ZOFRAN) injection 4 mg (4 mg Intravenous Given 04/05/17 0900)  albuterol (PROVENTIL) (2.5 MG/3ML) 0.083% nebulizer solution 5 mg (5 mg Nebulization Given 04/05/17 0900)  ipratropium (ATROVENT) nebulizer solution 0.5 mg (0.5 mg Nebulization Given 04/05/17 0900)     Initial Impression / Assessment and Plan / ED Course  I have reviewed the triage vital signs and the nursing notes.  Pertinent labs & imaging results that were available during my care of the patient were reviewed by me and considered in my medical decision making (see chart for details).    This is a 56 y.o. Female with a history of asthma, COPD and smoking who presents to the ED complaining of shortness of breath starting today. Patient reports she awoke with shortness of breath and chest tightness and checked her oxygen saturation at home and noted it to be 71%. She reports doing her breathing treatments before EMS arrived and her oxygen saturations improved greatly. She received a milligram of Atrovent, 10 mg albuterol and 125 mg of Solu-Medrol by EMS in route. Patient tells me she is currently feeling much better. She no longer feels short of breath. She denies having any chest pain today. On exam the patient is afebrile nontoxic appearing. She is expiratory wheezes noted bilaterally. No increased work of breathing. Oxygen saturation is 94% on room air. No lower extremity  edema or tenderness. EKG shows normal sinus rhythm. CBC is remarkable only for white count of 12,000. BMP is unremarkable. Chest x-ray is unremarkable. At reevaluation after breathing treatment patient reports feeling much better. She no longer feels short of breath. She feels back to normal. Oxygen saturation is 95% on room air. Wheezing has greatly improved and nearly resolved on repeat lung exam. Patient ambulated and according to nursing staff her oxygen saturation did drop  to 89% on room air. Patient tells me well and urinating she felt like she was back to normal. She denies having any shortness of breath or chest tightness while ambulating. Patient likely chronically has some low O2 saturation due to smoking and COPD. She is feeling back to normal and she feels comfortable being discharged in managing her exacerbation at home. I feel comfortable discharging her as well this time. I discussed strict and specific return precautions. I encouraged her to continue using her breathing machine at home. Prednisone at home. I encouraged close follow-up by primary care to discuss any changes needed to her inhaled corticosteroids. I advised the patient to follow-up with their primary care provider this week. I advised the patient to return to the emergency department with new or worsening symptoms or new concerns. The patient verbalized understanding and agreement with plan.    This patient was discussed with Dr. Clarene Duke who agrees with assessment and plan.   Final Clinical Impressions(s) / ED Diagnoses   Final diagnoses:  COPD exacerbation (HCC)  SOB (shortness of breath)    New Prescriptions New Prescriptions   PREDNISONE (DELTASONE) 20 MG TABLET    Take 2 tablets (40 mg total) by mouth daily.     Everlene Farrier, PA-C 04/05/17 1127    Laurence Spates, MD 04/13/17 [phone]

## 2017-04-18 ENCOUNTER — Encounter (HOSPITAL_COMMUNITY): Payer: Self-pay | Admitting: *Deleted

## 2017-04-18 ENCOUNTER — Emergency Department (HOSPITAL_COMMUNITY)
Admission: EM | Admit: 2017-04-18 | Discharge: 2017-04-19 | Disposition: A | Discharge status: Home / Self Care | Payer: Medicare Other | Attending: Emergency Medicine | Admitting: Emergency Medicine

## 2017-04-18 ENCOUNTER — Emergency Department (HOSPITAL_COMMUNITY): Payer: Medicare Other

## 2017-04-18 DIAGNOSIS — Z79899 Other long term (current) drug therapy: Secondary | ICD-10-CM | POA: Diagnosis not present

## 2017-04-18 DIAGNOSIS — J441 Chronic obstructive pulmonary disease with (acute) exacerbation: Secondary | ICD-10-CM

## 2017-04-18 DIAGNOSIS — Z91040 Latex allergy status: Secondary | ICD-10-CM | POA: Diagnosis not present

## 2017-04-18 DIAGNOSIS — F17210 Nicotine dependence, cigarettes, uncomplicated: Secondary | ICD-10-CM | POA: Diagnosis not present

## 2017-04-18 DIAGNOSIS — I10 Essential (primary) hypertension: Secondary | ICD-10-CM | POA: Insufficient documentation

## 2017-04-18 DIAGNOSIS — Z9660 Presence of unspecified orthopedic joint implant: Secondary | ICD-10-CM | POA: Diagnosis not present

## 2017-04-18 DIAGNOSIS — R0602 Shortness of breath: Secondary | ICD-10-CM | POA: Diagnosis present

## 2017-04-18 LAB — BASIC METABOLIC PANEL
Anion gap: 9 (ref 5–15)
BUN: 6 mg/dL (ref 6–20)
CHLORIDE: 104 mmol/L (ref 101–111)
CO2: 25 mmol/L (ref 22–32)
CREATININE: 0.91 mg/dL (ref 0.44–1.00)
Calcium: 8.5 mg/dL — ABNORMAL LOW (ref 8.9–10.3)
GFR calc Af Amer: >60 mL/min (ref >60)
GFR calc non Af Amer: >60 mL/min (ref >60)
GLUCOSE: 133 mg/dL — AB (ref 65–99)
Potassium: 2.9 mmol/L — ABNORMAL LOW (ref 3.5–5.1)
SODIUM: 138 mmol/L (ref 135–145)

## 2017-04-18 LAB — CBC WITH DIFFERENTIAL/PLATELET
Basophils Absolute: 0 10*3/uL (ref 0.0–0.1)
Basophils Relative: 0 %
EOS ABS: 0.2 10*3/uL (ref 0.0–0.7)
EOS PCT: 2 %
HCT: 40.5 % (ref 36.0–46.0)
HEMOGLOBIN: 13.8 g/dL (ref 12.0–15.0)
LYMPHS ABS: 1.5 10*3/uL (ref 0.7–4.0)
LYMPHS PCT: 16 %
MCH: 30.2 pg (ref 26.0–34.0)
MCHC: 34.1 g/dL (ref 30.0–36.0)
MCV: 88.6 fL (ref 78.0–100.0)
MONOS PCT: 5 %
Monocytes Absolute: 0.5 10*3/uL (ref 0.1–1.0)
NEUTROS PCT: 77 %
Neutro Abs: 7.3 10*3/uL (ref 1.7–7.7)
Platelets: 309 10*3/uL (ref 150–400)
RBC: 4.57 MIL/uL (ref 3.87–5.11)
RDW: 13.9 % (ref 11.5–15.5)
WBC: 9.5 10*3/uL (ref 4.0–10.5)

## 2017-04-18 MED ORDER — METHYLPREDNISOLONE SODIUM SUCC 125 MG IJ SOLR
125.0000 mg | Freq: Once | INTRAMUSCULAR | Status: AC
  Administered 2017-04-18: 125 mg via INTRAVENOUS
  Filled 2017-04-18: qty 2

## 2017-04-18 MED ORDER — PREDNISONE 10 MG (21) PO TBPK: ORAL_TABLET | Freq: Every day | ORAL | 0 refills | Status: DC

## 2017-04-18 MED ORDER — ALBUTEROL SULFATE (2.5 MG/3ML) 0.083% IN NEBU
5.0000 mg | INHALATION_SOLUTION | Freq: Once | RESPIRATORY_TRACT | Status: AC
  Administered 2017-04-18: 5 mg via RESPIRATORY_TRACT
  Filled 2017-04-18: qty 6

## 2017-04-18 MED ORDER — FAMOTIDINE IN NACL 20-0.9 MG/50ML-% IV SOLN
20.0000 mg | Freq: Once | INTRAVENOUS | Status: AC
  Administered 2017-04-18: 20 mg via INTRAVENOUS
  Filled 2017-04-18: qty 50

## 2017-04-18 MED ORDER — DOXYCYCLINE HYCLATE 100 MG PO CAPS: 100.0000 mg | ORAL_CAPSULE | Freq: Two times a day (BID) | ORAL | 0 refills | Status: DC

## 2017-04-18 MED ORDER — DIPHENHYDRAMINE HCL 50 MG/ML IJ SOLN
25.0000 mg | Freq: Once | INTRAMUSCULAR | Status: AC
  Administered 2017-04-18: 25 mg via INTRAVENOUS
  Filled 2017-04-18: qty 1

## 2017-04-18 MED ORDER — ACETAMINOPHEN 325 MG PO TABS
650.0000 mg | ORAL_TABLET | Freq: Once | ORAL | Status: AC
  Administered 2017-04-18: 650 mg via ORAL
  Filled 2017-04-18: qty 2

## 2017-04-18 NOTE — Discharge Instructions (Signed)
Use your home oxygen as needed. Use her home nebulizer every 4-6 hours as needed. Return here for any trouble breathing. Also use Benadryl if you have any itching

## 2017-04-18 NOTE — ED Triage Notes (Signed)
Pt arrives via EMS from home with c/o SOB. Hx of COPD and asthma, 3L O2 while asleep.  c/o SOB since this morning when she woke up "more so than most days",  but worse after taking the Levaquin dose at 4pm.  Dr. Joycelyn ManZimmerman called pt in prescription for Levaquin, pt says when she went to the pharmacy they told her she was allergic to the medicine, she was unsure about allergy, so she just went ahead and took it. Pt had rash on scene and benadryl administered by [employer] at the scene for the same. Pt was also given 5/2.5 duoneb en route by EMS. 152/88, hr 106, r18, 99% with neb/ 94%RA. On arrival, pt has wheezing throughout. She also c/o nausea.

## 2017-04-18 NOTE — ED Notes (Signed)
Bed: [id number] Expected date:  Expected time:  Means of arrival:  Comments: Difficulty breathing

## 2017-04-18 NOTE — ED Provider Notes (Signed)
[facility]-EMERGENCY DEPT Provider Note   CSN: [id number] Arrival date & time: 04/18/17  2018     History   Chief Complaint Chief Complaint  Patient presents with  . Shortness of Breath    HPI Deanna Yang is a 56 y.o. female.  56 year old female with history of COPD presents with increasing shortness of breath times several days. [facility] physician called in a prescription for Levaquin and she states that proximal an hour after that she began to have a hive-like rash as well as was itchy all over. Called EMS was given Benadryl orally and feels about the same. Patient states that she has had no fever or chills. Cough is been nonproductive. She does have a history of tobacco use. No vomiting. No anginal type chest pain. Has also been given albuterol with limited relief.      Past Medical History:  Diagnosis Date  . Anxiety   . COPD (chronic obstructive pulmonary disease) (HCC)   . Depression   . Fibromyalgia   . Hypertension   . Hypertension   . Manic depression (HCC)   . Neuropathy   . OSA (obstructive sleep apnea)     Patient Active Problem List   Diagnosis Date Noted  . CFIDS (chronic fatigue and immune dysfunction syndrome) (HCC) 08/20/2015  . Foot pain 08/20/2015  . Essential (primary) hypertension 08/20/2015  . Asthma, mild persistent 08/20/2015  . Obstructive apnea 08/20/2015  . Cervical osteoarthritis 08/20/2015  . Sleep disorder 08/20/2015  . Other specified postprocedural states 07/17/2015  . Borderline personality disorder 06/11/2015    Past Surgical History:  Procedure Laterality Date  . ABDOMINAL SURGERY    . APPENDECTOMY    . BACK SURGERY    . BREAST REDUCTION SURGERY    . BREAST SURGERY    . CARPAL TUNNEL RELEASE Bilateral   . CESAREAN SECTION    . CHOLECYSTECTOMY    . HERNIA REPAIR    . JOINT REPLACEMENT    . KNEE SURGERY    . TONSILLECTOMY      OB History    No data available       Home Medications    Prior to Admission medications     Medication Sig Start Date End Date Taking? Authorizing Provider  albuterol (PROVENTIL HFA;VENTOLIN HFA) 108 (90 BASE) MCG/ACT inhaler Inhale 2 puffs into the lungs every 6 (six) hours as needed for wheezing.    Yes [provider]  amLODipine (NORVASC) 5 MG tablet Take 5 mg by mouth daily.  11/10/16  Yes [provider]  ipratropium-albuterol (DUONEB) 0.5-2.5 (3) MG/3ML SOLN Inhale 3 mLs into the lungs every 4 (four) hours as needed for wheezing or shortness of breath. 04/15/17  Yes [provider]  levofloxacin (LEVAQUIN) 500 MG tablet Take 500 mg by mouth daily. 04/17/17 04/24/17 Yes [provider]  morphine (MS CONTIN) 30 MG 12 hr tablet Take 30 mg by mouth every 12 (twelve) hours. 04/05/17  Yes [provider]  TRELEGY ELLIPTA 100-62.5-25 MCG/INH AEPB Inhale 1 puff into the lungs at bedtime. 03/30/17  Yes [provider]  azithromycin (ZITHROMAX) 250 MG tablet Take 1 tablet (250 mg total) by mouth daily. Patient not taking: Reported on 04/18/2017 02/05/17   Renne Crigler, PA-C  citalopram (CELEXA) 20 MG tablet Take 1 tablet (20 mg total) by mouth daily. Start with half a tablet a day for first week then one a day Patient not taking: Reported on 04/18/2017 01/10/17 01/10/18  Thresa Ross, MD  Gabapentin, Once-Daily,  300 MG TABS Take by mouth. 01/06/17 04/06/17  [provider]  hydrOXYzine (VISTARIL) 25 MG capsule One cap up to every 6 hours for panic attacks 12/19/16   [provider]  LamoTRIgine (LAMICTAL PO) Take 1 tablet by mouth at bedtime.     [provider]  oxyCODONE-acetaminophen (PERCOCET) 10-325 MG per tablet Take 1 tablet by mouth 4 (four) times daily.    [provider]  predniSONE (DELTASONE) 20 MG tablet Take 2 tablets (40 mg total) by mouth daily. Patient not taking: Reported on 04/18/2017 04/05/17   Everlene Farrieransie, William, PA-C  PRESCRIPTION MEDICATION Take 1 tablet by mouth daily. Acid Reducer    [provider]  PRESCRIPTION MEDICATION Take 1 tablet by mouth daily. Mood Stablizer    [provider]    Family History Family History  Problem Relation Age of Onset  . Alcohol abuse Mother   . Alcohol abuse Father   . Depression Sister   . Dementia Neg Hx     Social History Social History  Substance Use Topics  . Smoking status: Current Every Day Smoker    Packs/day: 1.00    Types: Cigarettes  . Smokeless tobacco: Never Used  . Alcohol use No     Allergies   Ambien [zolpidem tartrate]; Beta adrenergic blockers; Fish allergy; Iodinated diagnostic agents; Latex; Metoprolol; Penicillins; Red dye; Shellfish allergy; Sulfa antibiotics; Amoxicillin-pot clavulanate; Aspirin; Dye fdc red  [amaranth (fd&c red #2)]; Other; Tape; Levofloxacin; and Seroquel [quetiapine fumarate]   Review of Systems Review of Systems  All other systems reviewed and are negative.    Physical Exam Updated Vital Signs BP 134/85 (BP Location: Left Arm)   Pulse (!) 115   Temp 98.2 F (36.8 C) (Oral)   Resp (!) 23   Ht 5\' 7"  (1.702 m)   Wt 90.7 kg   SpO2 100%   BMI 31.32 kg/m   Physical Exam  Constitutional: She is oriented to person, place, and time. She appears well-developed and well-nourished.  Non-toxic appearance. No distress.  HENT:  Head: Normocephalic and atraumatic.  Eyes: Conjunctivae, EOM and lids are normal. Pupils are equal, round, and reactive to [place]iggs.  Neck: Normal range of motion. Neck supple. No tracheal deviation present. No thyroid mass present.  Cardiovascular: Normal rate, regular rhythm and normal heart sounds.  Exam reveals no gallop.   No murmur heard. Pulmonary/Chest: Effort normal. No stridor. No respiratory distress. She has decreased breath sounds in the right lower field and the left lower field. She has wheezes in the right lower field and the left lower field. She has no rhonchi. She has no rales.  Abdominal: Soft. Normal appearance and bowel sounds  are normal. She exhibits no distension. There is no tenderness. There is no rebound and no CVA tenderness.  Musculoskeletal: Normal range of motion. She exhibits no edema or tenderness.  Neurological: She is alert and oriented to person, place, and time. She has normal strength. No cranial nerve deficit or sensory deficit. GCS eye subscore is 4. GCS verbal subscore is 5. GCS motor subscore is 6.  Skin: Skin is warm and dry. No abrasion and no rash noted.  Psychiatric: She has a normal mood and affect. Her speech is normal and behavior is normal.  Nursing note and vitals reviewed.    ED Treatments / Results  Labs (all labs ordered are listed, but only abnormal results are displayed) Labs Reviewed  CBC WITH DIFFERENTIAL/PLATELET  BASIC METABOLIC PANEL    EKG  EKG Interpretation  Date/Time:  Tuesday Apr 18 2017 20:31:40 EDT Ventricular Rate:  111 PR Interval:    QRS Duration: 74 QT Interval:  384 QTC Calculation: 522 R Axis:   68 Text Interpretation:  Sinus tachycardia Repol abnrm, severe global ischemia (LM/MVD) Prolonged QT interval Confirmed by Freida Busman  MD, Treyton Slimp ([id number]) on 04/18/2017 9:10:43 PM       Radiology Dg Chest 2 View  Result Date: 04/18/2017 CLINICAL DATA:  Antibiotics for 10 days for severe sinus infection with no relief. Cough for 2 days. Shortness of breath for 1 week. History of pneumonia in February and March. EXAM: CHEST  2 VIEW COMPARISON:  04/05/2017 FINDINGS: Normal heart size and pulmonary vascularity. Prominent apical cardiac fat pad. No focal airspace disease or consolidation in the lungs. No blunting of costophrenic angles. No pneumothorax. Mediastinal contours appear intact. Postoperative changes in the cervical spine. IMPRESSION: No active cardiopulmonary disease. Electronically Signed   By: Burman Nieves M.D.   On: 04/18/2017 21:04    Procedures Procedures (including critical care time)  Medications Ordered in ED Medications  methylPREDNISolone  sodium succinate (SOLU-MEDROL) 125 mg/2 mL injection 125 mg (not administered)     Initial Impression / Assessment and Plan / ED Course  I have reviewed the triage vital signs and the nursing notes.  Pertinent labs & imaging results that were available during my care of the patient were reviewed by me and considered in my medical decision making (see chart for details).     Patient possible allergic reaction as well as COPD exacerbation. Was given Benadryl as well as Pepcid as well as Solu-Medrol. Wheezing has greatly improved. Patient states that she wants go home. She has home oxygen as well as a nebulizer. States that she dorsiflexes, the hospital. Will restart her prednisone taper. Return precautions given.  Final Clinical Impressions(s) / ED Diagnoses   Final diagnoses:  None    New Prescriptions New Prescriptions   No medications on file     Lorre Nick, MD 04/18/17 2344

## 2017-04-21 ENCOUNTER — Inpatient Hospital Stay (HOSPITAL_COMMUNITY)
Admission: EM | Admit: 2017-04-21 | Discharge: 2017-04-28 | DRG: 189 | Disposition: A | Discharge status: Skilled Nursing Facility | Payer: Medicare Other | Attending: Family Medicine | Admitting: Family Medicine

## 2017-04-21 ENCOUNTER — Encounter (HOSPITAL_COMMUNITY): Payer: Self-pay | Admitting: Emergency Medicine

## 2017-04-21 ENCOUNTER — Emergency Department (HOSPITAL_COMMUNITY): Payer: Medicare Other

## 2017-04-21 DIAGNOSIS — F411 Generalized anxiety disorder: Secondary | ICD-10-CM | POA: Diagnosis present

## 2017-04-21 DIAGNOSIS — G4733 Obstructive sleep apnea (adult) (pediatric): Secondary | ICD-10-CM | POA: Diagnosis present

## 2017-04-21 DIAGNOSIS — J383 Other diseases of vocal cords: Secondary | ICD-10-CM | POA: Diagnosis present

## 2017-04-21 DIAGNOSIS — Z882 Allergy status to sulfonamides status: Secondary | ICD-10-CM

## 2017-04-21 DIAGNOSIS — Z888 Allergy status to other drugs, medicaments and biological substances status: Secondary | ICD-10-CM

## 2017-04-21 DIAGNOSIS — R9431 Abnormal electrocardiogram [ECG] [EKG]: Secondary | ICD-10-CM

## 2017-04-21 DIAGNOSIS — Z91040 Latex allergy status: Secondary | ICD-10-CM

## 2017-04-21 DIAGNOSIS — J441 Chronic obstructive pulmonary disease with (acute) exacerbation: Secondary | ICD-10-CM | POA: Diagnosis not present

## 2017-04-21 DIAGNOSIS — D72829 Elevated white blood cell count, unspecified: Secondary | ICD-10-CM | POA: Diagnosis present

## 2017-04-21 DIAGNOSIS — F4310 Post-traumatic stress disorder, unspecified: Secondary | ICD-10-CM | POA: Diagnosis present

## 2017-04-21 DIAGNOSIS — Z880 Allergy status to penicillin: Secondary | ICD-10-CM

## 2017-04-21 DIAGNOSIS — D803 Selective deficiency of immunoglobulin G [IgG] subclasses: Secondary | ICD-10-CM | POA: Diagnosis present

## 2017-04-21 DIAGNOSIS — J449 Chronic obstructive pulmonary disease, unspecified: Secondary | ICD-10-CM | POA: Diagnosis present

## 2017-04-21 DIAGNOSIS — J9601 Acute respiratory failure with hypoxia: Secondary | ICD-10-CM | POA: Diagnosis not present

## 2017-04-21 DIAGNOSIS — Z91013 Allergy to seafood: Secondary | ICD-10-CM

## 2017-04-21 DIAGNOSIS — F319 Bipolar disorder, unspecified: Secondary | ICD-10-CM | POA: Diagnosis present

## 2017-04-21 DIAGNOSIS — E876 Hypokalemia: Secondary | ICD-10-CM | POA: Diagnosis present

## 2017-04-21 DIAGNOSIS — T17908A Unspecified foreign body in respiratory tract, part unspecified causing other injury, initial encounter: Secondary | ICD-10-CM

## 2017-04-21 DIAGNOSIS — Z993 Dependence on wheelchair: Secondary | ICD-10-CM

## 2017-04-21 DIAGNOSIS — Z886 Allergy status to analgesic agent status: Secondary | ICD-10-CM

## 2017-04-21 DIAGNOSIS — I10 Essential (primary) hypertension: Secondary | ICD-10-CM | POA: Diagnosis present

## 2017-04-21 DIAGNOSIS — R0603 Acute respiratory distress: Secondary | ICD-10-CM

## 2017-04-21 LAB — CBC
HCT: 45.9 % (ref 36.0–46.0)
Hemoglobin: 14.8 g/dL (ref 12.0–15.0)
MCH: 29.5 pg (ref 26.0–34.0)
MCHC: 32.2 g/dL (ref 30.0–36.0)
MCV: 91.4 fL (ref 78.0–100.0)
PLATELETS: 435 10*3/uL — AB (ref 150–400)
RBC: 5.02 MIL/uL (ref 3.87–5.11)
RDW: 14.6 % (ref 11.5–15.5)
WBC: 15.5 10*3/uL — ABNORMAL HIGH (ref 4.0–10.5)

## 2017-04-21 MED ORDER — MAGNESIUM SULFATE 2 GM/50ML IV SOLN: 2.0000 g | Freq: Once | INTRAVENOUS | Status: DC

## 2017-04-21 MED ORDER — ALBUTEROL (5 MG/ML) CONTINUOUS INHALATION SOLN
15.0000 mg/h | INHALATION_SOLUTION | Freq: Once | RESPIRATORY_TRACT | Status: AC
  Administered 2017-04-21: 15 mg/h via RESPIRATORY_TRACT
  Filled 2017-04-21: qty 20

## 2017-04-21 MED ORDER — LORAZEPAM 2 MG/ML IJ SOLN
INTRAMUSCULAR | Status: AC
  Filled 2017-04-21: qty 1

## 2017-04-21 MED ORDER — LORAZEPAM 2 MG/ML IJ SOLN
0.5000 mg | Freq: Once | INTRAMUSCULAR | Status: AC
  Administered 2017-04-21: 0.5 mg via INTRAVENOUS

## 2017-04-21 NOTE — ED Provider Notes (Signed)
[facility]-EMERGENCY DEPT Provider Note    By signing my name below, I, Earmon Phoenix, attest that this documentation has been prepared under the direction and in the presence of Azalia Bilis, MD. Electronically Signed: Earmon Phoenix, ED Scribe. 04/22/17. 1:23 AM.    History   Chief Complaint Chief Complaint  Patient presents with  . Shortness of Breath   LEVEL 5 CAVEAT- Full history could not be obtained due to respiratory distress.   The history is provided by the patient and medical records. No language interpreter was used.    HPI Comments:  Deanna Yang is an obese 56 y.o. female with PMHx of COPD, HTN brought in by EMS, who presents to the Emergency Department complaining of SOB that started this morning. She has been taking steroids, Doxycycline and bronchodilators throughout the day with no significant relief. She states she went to see her pulmonologist again today and was given additional steroids. EMS reports 81% on RA at arrival to her home which improved to 95% on CPAP. She has been given Magnesium 2 grams via EMS. She is on home oxygen and is a smoker.   Past Medical History:  Diagnosis Date  . Anxiety   . COPD (chronic obstructive pulmonary disease) (HCC)   . Depression   . Fibromyalgia   . Hypertension   . Hypertension   . Manic depression (HCC)   . Neuropathy   . OSA (obstructive sleep apnea)     Patient Active Problem List   Diagnosis Date Noted  . Acute respiratory failure with hypoxia (HCC) 04/22/2017  . Hypokalemia 04/22/2017  . Leukocytosis 04/22/2017  . Prolonged QT interval 04/22/2017  . CFIDS (chronic fatigue and immune dysfunction syndrome) (HCC) 08/20/2015  . Foot pain 08/20/2015  . Essential (primary) hypertension 08/20/2015  . COPD with acute exacerbation (HCC) 08/20/2015  . Obstructive apnea 08/20/2015  . Cervical osteoarthritis 08/20/2015  . Sleep disorder 08/20/2015  . Other specified postprocedural states 07/17/2015  . Borderline  personality disorder 06/11/2015    Past Surgical History:  Procedure Laterality Date  . ABDOMINAL SURGERY    . APPENDECTOMY    . BACK SURGERY    . BREAST REDUCTION SURGERY    . BREAST SURGERY    . CARPAL TUNNEL RELEASE Bilateral   . CESAREAN SECTION    . CHOLECYSTECTOMY    . HERNIA REPAIR    . JOINT REPLACEMENT    . KNEE SURGERY    . TONSILLECTOMY      OB History    No data available       Home Medications    Prior to Admission medications   Medication Sig Start Date End Date Taking? Authorizing Provider  albuterol (PROVENTIL HFA;VENTOLIN HFA) 108 (90 BASE) MCG/ACT inhaler Inhale 2 puffs into the lungs every 6 (six) hours as needed for wheezing.     [provider]  amLODipine (NORVASC) 5 MG tablet Take 5 mg by mouth 2 (two) times daily.  11/10/16   [provider]  baclofen (LIORESAL) 10 MG tablet Take 5 mg by mouth 3 (three) times daily. 03/29/17   [provider]  cetirizine (ZYRTEC) 10 MG tablet Take 10 mg by mouth daily.    [provider]  cholecalciferol (VITAMIN D) 1000 units tablet Take 2,000 Units by mouth daily.    [provider]  citalopram (CELEXA) 20 MG tablet Take 1 tablet (20 mg total) by mouth daily. Start with half a tablet a day for first week then one a day Patient  not taking: Reported on 04/18/2017 01/10/17 01/10/18  Thresa Ross, MD  doxycycline (VIBRAMYCIN) 100 MG capsule Take 1 capsule (100 mg total) by mouth 2 (two) times daily. 04/18/17   Lorre Nick, MD  fluticasone (FLONASE) 50 MCG/ACT nasal spray Place 1 spray into both nostrils daily. 04/07/17   [provider]  GRALISE 300 MG TABS Take 900 mg by mouth at bedtime. 02/06/17   [provider]  ipratropium-albuterol (DUONEB) 0.5-2.5 (3) MG/3ML SOLN Inhale 3 mLs into the lungs every 4 (four) hours as needed for wheezing or shortness of breath. 04/15/17   [provider]  morphine (MS CONTIN) 30 MG 12 hr tablet Take 30 mg by mouth every  12 (twelve) hours. 04/05/17   [provider]  oxyCODONE-acetaminophen (PERCOCET) 10-325 MG per tablet Take 1 tablet by mouth 4 (four) times daily.    [provider]  PRESCRIPTION MEDICATION Take 1 tablet by mouth daily. Acid Reducer    [provider]  promethazine (PHENERGAN) 25 MG tablet Take 12.5 mg by mouth every 8 (eight) hours as needed for nausea/vomiting. 04/15/17   [provider]  TRELEGY ELLIPTA 100-62.5-25 MCG/INH AEPB Inhale 1 puff into the lungs at bedtime. 03/30/17   [provider]  vitamin B-12 (CYANOCOBALAMIN) 1000 MCG tablet Take 4,000 mcg by mouth daily.    [provider]    Family History Family History  Problem Relation Age of Onset  . Alcohol abuse Mother   . Alcohol abuse Father   . Depression Sister   . Dementia Neg Hx     Social History Social History  Substance Use Topics  . Smoking status: Current Every Day Smoker    Packs/day: 1.00    Types: Cigarettes  . Smokeless tobacco: Never Used  . Alcohol use No     Allergies   Ambien [zolpidem tartrate]; Beta adrenergic blockers; Fish allergy; Iodinated diagnostic agents; Latex; Metoprolol; Penicillins; Red dye; Shellfish allergy; Sulfa antibiotics; Amoxicillin-pot clavulanate; Aspirin; Dye fdc red  [amaranth (fd&c red #2)]; Other; Tape; Levofloxacin; and Seroquel [quetiapine fumarate]   Review of Systems Review of Systems  Unable to perform ROS: Severe respiratory distress     LEVEL 5 CAVEAT- Full history could not be obtained due to respiratory distress.    Physical Exam Updated Vital Signs BP (!) 147/99   Pulse (!) 144   Temp 98.6 F (37 C) (Axillary)   Resp (!) 24   SpO2 99%   Physical Exam  Constitutional: She is oriented to person, place, and time. She appears well-developed and well-nourished. No distress.  HENT:  Head: Normocephalic and atraumatic.  Eyes: EOM are normal.  Neck: Normal range of motion.  Cardiovascular: Regular  rhythm and normal heart sounds.  Tachycardia present.   Pulmonary/Chest: Accessory muscle usage present. Tachypnea noted. She is in respiratory distress. She has wheezes.  Bilateral wheezes  Abdominal: Soft. She exhibits no distension. There is no tenderness.  Musculoskeletal: Normal range of motion.  Neurological: She is alert and oriented to person, place, and time.  Skin: Skin is warm and dry.  Psychiatric: She has a normal mood and affect. Judgment normal.  Nursing note and vitals reviewed.    ED Treatments / Results  DIAGNOSTIC STUDIES: Oxygen Saturation is 99% on CPAP, adequate by my interpretation.   COORDINATION OF CARE: 11:29 PM- Will order CXR. Pt verbalizes understanding and agrees to plan.  1:00 AM- Updated family on admission.   Medications  baclofen (LIORESAL) tablet 5 mg (not administered)  loratadine (  CLARITIN) tablet 10 mg (not administered)  cholecalciferol (VITAMIN D) tablet 2,000 Units (not administered)  fluticasone (FLONASE) 50 MCG/ACT nasal spray 1 spray (not administered)  Gabapentin (Once-Daily) TABS 900 mg (not administered)  ipratropium-albuterol (DUONEB) 0.5-2.5 (3) MG/3ML nebulizer solution 3 mL (not administered)  morphine (MS CONTIN) 12 hr tablet 30 mg (30 mg Oral Not Given 04/22/17 0119)  Fluticasone-Umeclidin-Vilant 100-62.5-25 MCG/INH AEPB 1 puff (not administered)  vitamin B-12 (CYANOCOBALAMIN) tablet 1,000 mcg (not administered)  amLODipine (NORVASC) tablet 5 mg (not administered)  oxyCODONE-acetaminophen (PERCOCET) 10-325 MG per tablet 1 tablet (not administered)  enoxaparin (LOVENOX) injection 40 mg (not administered)  sodium chloride flush (NS) 0.9 % injection 3 mL (not administered)  sodium chloride flush (NS) 0.9 % injection 3 mL (not administered)  sodium chloride flush (NS) 0.9 % injection 3 mL (not administered)  0.9 %  sodium chloride infusion (not administered)  acetaminophen (TYLENOL) tablet 650 mg (not administered)    Or    acetaminophen (TYLENOL) suppository 650 mg (not administered)  polyethylene glycol (MIRALAX / GLYCOLAX) packet 17 g (not administered)  ipratropium-albuterol (DUONEB) 0.5-2.5 (3) MG/3ML nebulizer solution 3 mL (3 mLs Nebulization Given 04/22/17 0122)  methylPREDNISolone sodium succinate (SOLU-MEDROL) 125 mg/2 mL injection 60 mg (not administered)  azithromycin (ZITHROMAX) 500 mg in dextrose 5 % 250 mL IVPB (not administered)  potassium phosphate 30 mEq in dextrose 5 % 500 mL infusion (not administered)  albuterol (PROVENTIL,VENTOLIN) solution continuous neb (15 mg/hr Nebulization Given 04/21/17 2337)  LORazepam (ATIVAN) injection 0.5 mg (0.5 mg Intravenous Given 04/21/17 2339)   Labs (all labs ordered are listed, but only abnormal results are displayed) Labs Reviewed  CBC - Abnormal; Notable for the following:       Result Value   WBC 15.5 (*)    Platelets 435 (*)    All other components within normal limits  COMPREHENSIVE METABOLIC PANEL - Abnormal; Notable for the following:    Potassium 3.0 (*)    Glucose, Bld 146 (*)    Creatinine, Ser 1.04 (*)    GFR calc non Af Amer 60 (*)    All other components within normal limits  CULTURE, EXPECTORATED SPUTUM-ASSESSMENT  TROPONIN I  HIV ANTIBODY (ROUTINE TESTING)  TSH  BASIC METABOLIC PANEL    EKG  EKG Interpretation None       Radiology Dg Chest Portable 1 View  Result Date: 04/22/2017 CLINICAL DATA:  56 y/o  F; respiratory distress.  Patient on BiPAP. EXAM: PORTABLE CHEST 1 VIEW COMPARISON:  04/18/2017 chest radiograph. FINDINGS: The heart size and mediastinal contours are within normal limits. Hyperinflated lungs compatible with COPD. No focal consolidation, effusion, or pneumothorax per The visualized skeletal structures are unremarkable. Partially visualized anterior cervical fusion hardware. IMPRESSION: No acute pulmonary process identified.  COPD. Electronically Signed   By: Mitzi Hansen M.D.   On: 04/22/2017 00:02     Procedures Procedures (including critical care time)  +++++++++++++++++++++++++++++++++++++++++++++  CRITICAL CARE Performed by: Lyanne Co Total critical care time: 32 minutes Critical care time was exclusive of separately billable procedures and treating other patients. Critical care was necessary to treat or prevent imminent or life-threatening deterioration. Critical care was time spent personally by me on the following activities: development of treatment plan with patient and/or surrogate as well as nursing, discussions with consultants, evaluation of patient's response to treatment, examination of patient, obtaining history from patient or surrogate, ordering and performing treatments and interventions, ordering and review of laboratory studies, ordering and review of radiographic studies, pulse  oximetry and re-evaluation of patient's condition.   ++++++++++++++++++++++++++++++++++++++++++++++++++++++    Medications Ordered in ED Medications  baclofen (LIORESAL) tablet 5 mg (not administered)  loratadine (CLARITIN) tablet 10 mg (not administered)  cholecalciferol (VITAMIN D) tablet 2,000 Units (not administered)  fluticasone (FLONASE) 50 MCG/ACT nasal spray 1 spray (not administered)  Gabapentin (Once-Daily) TABS 900 mg (not administered)  ipratropium-albuterol (DUONEB) 0.5-2.5 (3) MG/3ML nebulizer solution 3 mL (not administered)  morphine (MS CONTIN) 12 hr tablet 30 mg (30 mg Oral Not Given 04/22/17 0119)  Fluticasone-Umeclidin-Vilant 100-62.5-25 MCG/INH AEPB 1 puff (not administered)  vitamin B-12 (CYANOCOBALAMIN) tablet 1,000 mcg (not administered)  amLODipine (NORVASC) tablet 5 mg (not administered)  oxyCODONE-acetaminophen (PERCOCET) 10-325 MG per tablet 1 tablet (not administered)  enoxaparin (LOVENOX) injection 40 mg (not administered)  sodium chloride flush (NS) 0.9 % injection 3 mL (not administered)  sodium chloride flush (NS) 0.9 % injection 3 mL (not  administered)  sodium chloride flush (NS) 0.9 % injection 3 mL (not administered)  0.9 %  sodium chloride infusion (not administered)  acetaminophen (TYLENOL) tablet 650 mg (not administered)    Or  acetaminophen (TYLENOL) suppository 650 mg (not administered)  polyethylene glycol (MIRALAX / GLYCOLAX) packet 17 g (not administered)  ipratropium-albuterol (DUONEB) 0.5-2.5 (3) MG/3ML nebulizer solution 3 mL (3 mLs Nebulization Given 04/22/17 0122)  methylPREDNISolone sodium succinate (SOLU-MEDROL) 125 mg/2 mL injection 60 mg (not administered)  azithromycin (ZITHROMAX) 500 mg in dextrose 5 % 250 mL IVPB (not administered)  potassium phosphate 30 mEq in dextrose 5 % 500 mL infusion (not administered)  albuterol (PROVENTIL,VENTOLIN) solution continuous neb (15 mg/hr Nebulization Given 04/21/17 2337)  LORazepam (ATIVAN) injection 0.5 mg (0.5 mg Intravenous Given 04/21/17 2339)     Initial Impression / Assessment and Plan / ED Course  I have reviewed the triage vital signs and the nursing notes.  Pertinent labs & imaging results that were available during my care of the patient were reviewed by me and considered in my medical decision making (see chart for details).   respiratory failure.  Failed outpatient management of her COPD.  Severe respiratory distress on arrival.  Improving with BiPAP.  Patient be admitted the hospital after continuous bronchodilators.  Steroids by EMS.  Magnesium.  EMS please patient is making some improvement since they initially picked the patient up.  Patient be admitted to stepdown unit.  Final Clinical Impressions(s) / ED Diagnoses   Final diagnoses:  Acute respiratory failure with hypoxia (HCC)  COPD exacerbation (HCC)    New Prescriptions New Prescriptions   No medications on file    I personally performed the services described in this documentation, which was scribed in my presence. The recorded information has been reviewed and is accurate.        Azalia Bilisampos, Shell Blanchette, MD 04/22/17 [phone]

## 2017-04-21 NOTE — ED Notes (Signed)
Portable in room.  

## 2017-04-21 NOTE — ED Triage Notes (Signed)
Patient here from home, with increased shortness of breath during the day today, using her home nebulizer with no relief.  Patient arrives via [facility] on CPAP.  Patient was given 125mg  Solumedrol, 2gm Magnesium and 2 duonebs en route to ED.  Patient with 1-2 word sentences upon EMS arrival.  Per EMS, breath sounds very tight with wheezing.

## 2017-04-22 ENCOUNTER — Inpatient Hospital Stay (HOSPITAL_COMMUNITY): Payer: Medicare Other

## 2017-04-22 ENCOUNTER — Encounter (HOSPITAL_COMMUNITY): Payer: Self-pay | Admitting: Family Medicine

## 2017-04-22 DIAGNOSIS — F332 Major depressive disorder, recurrent severe without psychotic features: Secondary | ICD-10-CM | POA: Diagnosis not present

## 2017-04-22 DIAGNOSIS — Z886 Allergy status to analgesic agent status: Secondary | ICD-10-CM | POA: Diagnosis not present

## 2017-04-22 DIAGNOSIS — Z882 Allergy status to sulfonamides status: Secondary | ICD-10-CM | POA: Diagnosis not present

## 2017-04-22 DIAGNOSIS — R9431 Abnormal electrocardiogram [ECG] [EKG]: Secondary | ICD-10-CM | POA: Diagnosis not present

## 2017-04-22 DIAGNOSIS — Z993 Dependence on wheelchair: Secondary | ICD-10-CM | POA: Diagnosis not present

## 2017-04-22 DIAGNOSIS — J441 Chronic obstructive pulmonary disease with (acute) exacerbation: Secondary | ICD-10-CM | POA: Diagnosis present

## 2017-04-22 DIAGNOSIS — Z818 Family history of other mental and behavioral disorders: Secondary | ICD-10-CM | POA: Diagnosis not present

## 2017-04-22 DIAGNOSIS — I10 Essential (primary) hypertension: Secondary | ICD-10-CM | POA: Diagnosis present

## 2017-04-22 DIAGNOSIS — E876 Hypokalemia: Secondary | ICD-10-CM | POA: Diagnosis present

## 2017-04-22 DIAGNOSIS — Z811 Family history of alcohol abuse and dependence: Secondary | ICD-10-CM | POA: Diagnosis not present

## 2017-04-22 DIAGNOSIS — F411 Generalized anxiety disorder: Secondary | ICD-10-CM | POA: Diagnosis present

## 2017-04-22 DIAGNOSIS — D72829 Elevated white blood cell count, unspecified: Secondary | ICD-10-CM | POA: Diagnosis not present

## 2017-04-22 DIAGNOSIS — D803 Selective deficiency of immunoglobulin G [IgG] subclasses: Secondary | ICD-10-CM | POA: Diagnosis present

## 2017-04-22 DIAGNOSIS — Z91013 Allergy to seafood: Secondary | ICD-10-CM | POA: Diagnosis not present

## 2017-04-22 DIAGNOSIS — Z888 Allergy status to other drugs, medicaments and biological substances status: Secondary | ICD-10-CM | POA: Diagnosis not present

## 2017-04-22 DIAGNOSIS — R0902 Hypoxemia: Secondary | ICD-10-CM | POA: Diagnosis not present

## 2017-04-22 DIAGNOSIS — Z91040 Latex allergy status: Secondary | ICD-10-CM | POA: Diagnosis not present

## 2017-04-22 DIAGNOSIS — F319 Bipolar disorder, unspecified: Secondary | ICD-10-CM | POA: Diagnosis present

## 2017-04-22 DIAGNOSIS — J383 Other diseases of vocal cords: Secondary | ICD-10-CM | POA: Diagnosis present

## 2017-04-22 DIAGNOSIS — F17210 Nicotine dependence, cigarettes, uncomplicated: Secondary | ICD-10-CM | POA: Diagnosis not present

## 2017-04-22 DIAGNOSIS — J9601 Acute respiratory failure with hypoxia: Principal | ICD-10-CM

## 2017-04-22 DIAGNOSIS — R0603 Acute respiratory distress: Secondary | ICD-10-CM | POA: Diagnosis not present

## 2017-04-22 DIAGNOSIS — Z880 Allergy status to penicillin: Secondary | ICD-10-CM | POA: Diagnosis not present

## 2017-04-22 DIAGNOSIS — F4310 Post-traumatic stress disorder, unspecified: Secondary | ICD-10-CM | POA: Diagnosis present

## 2017-04-22 DIAGNOSIS — F441 Dissociative fugue: Secondary | ICD-10-CM | POA: Diagnosis not present

## 2017-04-22 DIAGNOSIS — Z810 Family history of intellectual disabilities: Secondary | ICD-10-CM | POA: Diagnosis not present

## 2017-04-22 DIAGNOSIS — G4733 Obstructive sleep apnea (adult) (pediatric): Secondary | ICD-10-CM | POA: Diagnosis present

## 2017-04-22 LAB — BASIC METABOLIC PANEL
Anion gap: 14 (ref 5–15)
BUN: 10 mg/dL (ref 6–20)
CHLORIDE: 104 mmol/L (ref 101–111)
CO2: 21 mmol/L — AB (ref 22–32)
CREATININE: 1.22 mg/dL — AB (ref 0.44–1.00)
Calcium: 8.6 mg/dL — ABNORMAL LOW (ref 8.9–10.3)
GFR calc Af Amer: 57 mL/min — ABNORMAL LOW (ref >60)
GFR calc non Af Amer: 49 mL/min — ABNORMAL LOW (ref >60)
GLUCOSE: 291 mg/dL — AB (ref 65–99)
POTASSIUM: 3.1 mmol/L — AB (ref 3.5–5.1)
Sodium: 139 mmol/L (ref 135–145)

## 2017-04-22 LAB — COMPREHENSIVE METABOLIC PANEL
ALK PHOS: 71 U/L (ref 38–126)
ALT: 32 U/L (ref 14–54)
ANION GAP: 12 (ref 5–15)
AST: 38 U/L (ref 15–41)
Albumin: 3.9 g/dL (ref 3.5–5.0)
BILIRUBIN TOTAL: 0.3 mg/dL (ref 0.3–1.2)
BUN: 11 mg/dL (ref 6–20)
CALCIUM: 9.2 mg/dL (ref 8.9–10.3)
CO2: 23 mmol/L (ref 22–32)
Chloride: 107 mmol/L (ref 101–111)
Creatinine, Ser: 1.04 mg/dL — ABNORMAL HIGH (ref 0.44–1.00)
GFR calc Af Amer: >60 mL/min (ref >60)
GFR, EST NON AFRICAN AMERICAN: 60 mL/min — AB (ref >60)
GLUCOSE: 146 mg/dL — AB (ref 65–99)
POTASSIUM: 3 mmol/L — AB (ref 3.5–5.1)
Sodium: 142 mmol/L (ref 135–145)
TOTAL PROTEIN: 6.6 g/dL (ref 6.5–8.1)

## 2017-04-22 LAB — GLUCOSE, CAPILLARY: Glucose-Capillary: 244 mg/dL — ABNORMAL HIGH (ref 65–99)

## 2017-04-22 LAB — TROPONIN I: Troponin I: <0.03 ng/mL (ref <0.03)

## 2017-04-22 LAB — HIV ANTIBODY (ROUTINE TESTING W REFLEX): HIV Screen 4th Generation wRfx: NONREACTIVE

## 2017-04-22 LAB — MRSA PCR SCREENING: MRSA by PCR: NEGATIVE

## 2017-04-22 LAB — TSH: TSH: 0.306 u[IU]/mL — ABNORMAL LOW (ref 0.350–4.500)

## 2017-04-22 MED ORDER — ENOXAPARIN SODIUM 40 MG/0.4ML ~~LOC~~ SOLN
40.0000 mg | SUBCUTANEOUS | Status: DC
  Administered 2017-04-22 – 2017-04-28 (×7): 40 mg via SUBCUTANEOUS
  Filled 2017-04-22 (×7): qty 0.4

## 2017-04-22 MED ORDER — SODIUM CHLORIDE 0.9 % IV SOLN: 250.0000 mL | INTRAVENOUS | Status: DC | PRN

## 2017-04-22 MED ORDER — ACETAMINOPHEN 325 MG PO TABS
650.0000 mg | ORAL_TABLET | Freq: Four times a day (QID) | ORAL | Status: DC | PRN
  Administered 2017-04-25 – 2017-04-26 (×2): 650 mg via ORAL
  Filled 2017-04-22 (×2): qty 2

## 2017-04-22 MED ORDER — PROMETHAZINE HCL 25 MG/ML IJ SOLN
12.5000 mg | Freq: Once | INTRAMUSCULAR | Status: AC
  Administered 2017-04-22: 12.5 mg via INTRAVENOUS
  Filled 2017-04-22: qty 1

## 2017-04-22 MED ORDER — ACETAMINOPHEN 650 MG RE SUPP: 650.0000 mg | Freq: Four times a day (QID) | RECTAL | Status: DC | PRN

## 2017-04-22 MED ORDER — SODIUM CHLORIDE 0.9% FLUSH
3.0000 mL | Freq: Two times a day (BID) | INTRAVENOUS | Status: DC
  Administered 2017-04-22 – 2017-04-27 (×5): 3 mL via INTRAVENOUS

## 2017-04-22 MED ORDER — OXYCODONE-ACETAMINOPHEN 5-325 MG PO TABS
1.0000 | ORAL_TABLET | Freq: Four times a day (QID) | ORAL | Status: DC | PRN
  Administered 2017-04-22 – 2017-04-28 (×10): 1 via ORAL
  Filled 2017-04-22 (×11): qty 1

## 2017-04-22 MED ORDER — LORATADINE 10 MG PO TABS
10.0000 mg | ORAL_TABLET | Freq: Every day | ORAL | Status: DC
  Administered 2017-04-22 – 2017-04-28 (×7): 10 mg via ORAL
  Filled 2017-04-22 (×7): qty 1

## 2017-04-22 MED ORDER — POLYETHYLENE GLYCOL 3350 17 G PO PACK
17.0000 g | PACK | Freq: Every day | ORAL | Status: DC | PRN
  Administered 2017-04-25: 17 g via ORAL
  Filled 2017-04-22: qty 1

## 2017-04-22 MED ORDER — OXYCODONE HCL 5 MG PO TABS
5.0000 mg | ORAL_TABLET | Freq: Four times a day (QID) | ORAL | Status: DC | PRN
  Administered 2017-04-22 – 2017-04-28 (×10): 5 mg via ORAL
  Filled 2017-04-22 (×10): qty 1

## 2017-04-22 MED ORDER — IPRATROPIUM-ALBUTEROL 0.5-2.5 (3) MG/3ML IN SOLN
3.0000 mL | Freq: Four times a day (QID) | RESPIRATORY_TRACT | Status: DC
  Administered 2017-04-22 – 2017-04-27 (×25): 3 mL via RESPIRATORY_TRACT
  Filled 2017-04-22 (×22): qty 3

## 2017-04-22 MED ORDER — ONDANSETRON HCL 4 MG PO TABS: 4.0000 mg | ORAL_TABLET | Freq: Four times a day (QID) | ORAL | Status: DC | PRN

## 2017-04-22 MED ORDER — FLUTICASONE PROPIONATE 50 MCG/ACT NA SUSP
1.0000 | Freq: Every day | NASAL | Status: DC
  Administered 2017-04-22 – 2017-04-28 (×7): 1 via NASAL
  Filled 2017-04-22: qty 16

## 2017-04-22 MED ORDER — BACLOFEN 10 MG PO TABS
5.0000 mg | ORAL_TABLET | Freq: Three times a day (TID) | ORAL | Status: DC
  Administered 2017-04-22 – 2017-04-28 (×21): 5 mg via ORAL
  Filled 2017-04-22 (×23): qty 1

## 2017-04-22 MED ORDER — PROMETHAZINE HCL 25 MG PO TABS
25.0000 mg | ORAL_TABLET | Freq: Three times a day (TID) | ORAL | Status: DC | PRN
  Administered 2017-04-22 – 2017-04-28 (×6): 25 mg via ORAL
  Filled 2017-04-22 (×6): qty 1

## 2017-04-22 MED ORDER — VITAMIN B-12 1000 MCG PO TABS
1000.0000 ug | ORAL_TABLET | Freq: Every day | ORAL | Status: DC
  Administered 2017-04-22 – 2017-04-28 (×7): 1000 ug via ORAL
  Filled 2017-04-22 (×7): qty 1

## 2017-04-22 MED ORDER — LORAZEPAM 2 MG/ML IJ SOLN
0.5000 mg | Freq: Once | INTRAMUSCULAR | Status: AC | PRN
  Administered 2017-04-22: 0.5 mg via INTRAVENOUS
  Filled 2017-04-22: qty 1

## 2017-04-22 MED ORDER — METHYLPREDNISOLONE SODIUM SUCC 125 MG IJ SOLR
60.0000 mg | Freq: Four times a day (QID) | INTRAMUSCULAR | Status: DC
  Administered 2017-04-22 – 2017-04-23 (×6): 60 mg via INTRAVENOUS
  Filled 2017-04-22 (×6): qty 2

## 2017-04-22 MED ORDER — GABAPENTIN (ONCE-DAILY) 300 MG PO TABS: 900.0000 mg | ORAL_TABLET | Freq: Every day | ORAL | Status: DC

## 2017-04-22 MED ORDER — GABAPENTIN 300 MG PO CAPS
300.0000 mg | ORAL_CAPSULE | Freq: Three times a day (TID) | ORAL | Status: DC
  Administered 2017-04-22 – 2017-04-24 (×8): 300 mg via ORAL
  Filled 2017-04-22 (×5): qty 1
  Filled 2017-04-22: qty 3
  Filled 2017-04-22 (×2): qty 1

## 2017-04-22 MED ORDER — SODIUM CHLORIDE 0.9% FLUSH
3.0000 mL | Freq: Two times a day (BID) | INTRAVENOUS | Status: DC
  Administered 2017-04-22 – 2017-04-28 (×12): 3 mL via INTRAVENOUS

## 2017-04-22 MED ORDER — ONDANSETRON HCL 4 MG/2ML IJ SOLN: 4.0000 mg | Freq: Four times a day (QID) | INTRAMUSCULAR | Status: DC | PRN

## 2017-04-22 MED ORDER — SODIUM CHLORIDE 0.9% FLUSH: 3.0000 mL | INTRAVENOUS | Status: DC | PRN

## 2017-04-22 MED ORDER — AZITHROMYCIN 500 MG IV SOLR
500.0000 mg | INTRAVENOUS | Status: DC
  Administered 2017-04-22 – 2017-04-24 (×3): 500 mg via INTRAVENOUS
  Filled 2017-04-22 (×3): qty 500

## 2017-04-22 MED ORDER — OXYCODONE-ACETAMINOPHEN 10-325 MG PO TABS: 1.0000 | ORAL_TABLET | Freq: Four times a day (QID) | ORAL | Status: DC | PRN

## 2017-04-22 MED ORDER — HYDROXYZINE HCL 25 MG PO TABS
25.0000 mg | ORAL_TABLET | Freq: Four times a day (QID) | ORAL | Status: DC | PRN
  Administered 2017-04-22 – 2017-04-28 (×12): 25 mg via ORAL
  Filled 2017-04-22 (×16): qty 1

## 2017-04-22 MED ORDER — VITAMIN D 1000 UNITS PO TABS
2000.0000 [IU] | ORAL_TABLET | Freq: Every day | ORAL | Status: DC
  Administered 2017-04-22 – 2017-04-28 (×7): 2000 [IU] via ORAL
  Filled 2017-04-22 (×7): qty 2

## 2017-04-22 MED ORDER — AMLODIPINE BESYLATE 5 MG PO TABS
5.0000 mg | ORAL_TABLET | Freq: Two times a day (BID) | ORAL | Status: DC
  Filled 2017-04-22: qty 1

## 2017-04-22 MED ORDER — FLUTICASONE-UMECLIDIN-VILANT 100-62.5-25 MCG/INH IN AEPB: 1.0000 | INHALATION_SPRAY | Freq: Every day | RESPIRATORY_TRACT | Status: DC

## 2017-04-22 MED ORDER — POTASSIUM PHOSPHATES 15 MMOLE/5ML IV SOLN
30.0000 meq | Freq: Once | INTRAVENOUS | Status: AC
  Administered 2017-04-22: 30 meq via INTRAVENOUS
  Filled 2017-04-22: qty 6.82

## 2017-04-22 MED ORDER — IPRATROPIUM-ALBUTEROL 0.5-2.5 (3) MG/3ML IN SOLN
3.0000 mL | RESPIRATORY_TRACT | Status: DC | PRN
  Administered 2017-04-22 – 2017-04-25 (×5): 3 mL via RESPIRATORY_TRACT
  Filled 2017-04-22 (×9): qty 3

## 2017-04-22 MED ORDER — MORPHINE SULFATE ER 15 MG PO TBCR
30.0000 mg | EXTENDED_RELEASE_TABLET | Freq: Two times a day (BID) | ORAL | Status: DC
  Administered 2017-04-22 – 2017-04-28 (×13): 30 mg via ORAL
  Filled 2017-04-22 (×13): qty 2

## 2017-04-22 NOTE — ED Notes (Addendum)
MD ordered to take pt off of bipap. Resp notified and in room, bipap removed. Pt still seems anxious, tolerating 5L Thermal well. Spo2 95% on 5L 

## 2017-04-22 NOTE — H&P (Signed)
History and Physical    Yaniyah Koors WUJ:[id number] DOB: March 22, 1961 DOA: 04/21/2017  PCP: Salli Quarry, PA-C   Patient coming from: Home  Chief Complaint: SOB, wheezing   HPI: Deanna Yang is a 56 y.o. female with medical history significant for COPD, hypertension, and depression who presents to the emergency department for evaluation of progressive dyspnea, cough, and wheeze. Patient reports that she had been in her usual state until 10-14 days ago when she noted the insidious development of worsening in her chronic dyspnea and increase in her chronic cough. Since that time, her condition is continued to worsen and she is coughing up green sputum. She was evaluated twice in the emergency department for this and has seen her pulmonologist in the clinic twice during this illness. She was most recently seen 2 days ago, started on doxycycline and prednisone, but has not had any significant improvement. She had been on Levaquin prior to this and reported that she had seemed to be improving for a few days, before worsening severely over the past 3 days. She denies fevers or chills and denies chest pain or palpitations. She reports occasional lower extremity edema in the past which she attributes to arthritis, but none lately. She denies any prolonged immobilization or tenderness in the lower extremity. She denies personal or family history of VTE. She was increasingly dyspneic while at rest tonight and called EMS. They found her to be saturating 85% and in acute distress. She was placed on CPAP and treated with 125 mg IV Solu-Medrol, 2 g of magnesium, and 2 neb treatments while en route.  ED Course: Upon arrival to the ED, patient is found to be afebrile, saturating 100% on BiPAP, tachypneic, tachycardic, and mildly hypertensive. EKG features a sinus tachycardia with rate 146, diffuse nonspecific repolarization abnormality, and QTc interval of 593 ms. Chest x-ray is negative for acute cardiopulmonary  disease, but features findings consistent with COPD. Chemstrip panel is notable for a potassium of 3.0 and CBC features a leukocytosis of 15,500 and a mild thrombocytosis to 435,000. Troponin is undetectable. Patient was kept on BiPAP, given continuous albuterol treatment, and 0.5 mg IV Ativan. She made marked improvement in the ED, but continues to be dyspneic while at rest and will be admitted to the stepdown unit for ongoing evaluation and management of acute exacerbation and COPD with acute hypoxic respiratory failure.   Review of Systems:  All other systems reviewed and apart from HPI, are negative.  Past Medical History:  Diagnosis Date  . Anxiety   . COPD (chronic obstructive pulmonary disease) (HCC)   . Depression   . Fibromyalgia   . Hypertension   . Hypertension   . Manic depression (HCC)   . Neuropathy   . OSA (obstructive sleep apnea)     Past Surgical History:  Procedure Laterality Date  . ABDOMINAL SURGERY    . APPENDECTOMY    . BACK SURGERY    . BREAST REDUCTION SURGERY    . BREAST SURGERY    . CARPAL TUNNEL RELEASE Bilateral   . CESAREAN SECTION    . CHOLECYSTECTOMY    . HERNIA REPAIR    . JOINT REPLACEMENT    . KNEE SURGERY    . TONSILLECTOMY       reports that she has been smoking Cigarettes.  She has been smoking about 1.00 pack per day. She has never used smokeless tobacco. She reports that she does not drink alcohol or use drugs.  Allergies  Allergen Reactions  .  Ambien [Zolpidem Tartrate] Shortness Of Breath and Swelling    Tongue swelling, throat swelling. Tongue swelling, throat swelling.  . Beta Adrenergic Blockers Anaphylaxis  . Fish Allergy Rash and Shortness Of Breath  . Iodinated Diagnostic Agents Itching    Benadryl 50MG  prophylaxis before and after and patient reports she did fine  . Latex Itching    BLISTERS SKIN  . Metoprolol Other (See Comments)    ANY MEDICATION THAT ENDS IN -OLOL-   The patient is asthmatic.  Marland Kitchen. Penicillins Shortness  Of Breath, Rash and Other (See Comments)    Has patient had a PCN reaction causing immediate rash, facial/tongue/throat swelling, SOB or lightheadedness with hypotension: Yes Has patient had a PCN reaction causing severe rash involving mucus membranes or skin necrosis: Unknown Has patient had a PCN reaction that required hospitalization No Has patient had a PCN reaction occurring within the last 10 years: No If all of the above answers are "NO", then may proceed with Cephalosporin use.   . Red Dye Hives and Shortness Of Breath  . Shellfish Allergy Anaphylaxis  . Sulfa Antibiotics Hives and Shortness Of Breath  . Amoxicillin-Pot Clavulanate Nausea And Vomiting  . Aspirin Other (See Comments)    Blisters on tongue  . Dye Fdc Red  [Amaranth (Fd&C Red #2)] Hives    X ray DYE  (BENADRYL 50 MG PROPHYLAXIS AND AFTER AND  SHE DID FINE, PER PATIENT.).  Marland Kitchen. Other Hives  . Tape Dermatitis and Rash  . Levofloxacin Rash    headache  . Seroquel [Quetiapine Fumarate] Other (See Comments)    Unknown    Family History  Problem Relation Age of Onset  . Alcohol abuse Mother   . Alcohol abuse Father   . Depression Sister   . Dementia Neg Hx      Prior to Admission medications   Medication Sig Start Date End Date Taking? Authorizing Provider  albuterol (PROVENTIL HFA;VENTOLIN HFA) 108 (90 BASE) MCG/ACT inhaler Inhale 2 puffs into the lungs every 6 (six) hours as needed for wheezing.     [provider]  amLODipine (NORVASC) 5 MG tablet Take 5 mg by mouth 2 (two) times daily.  11/10/16   [provider]  baclofen (LIORESAL) 10 MG tablet Take 5 mg by mouth 3 (three) times daily. 03/29/17   [provider]  cetirizine (ZYRTEC) 10 MG tablet Take 10 mg by mouth daily.    [provider]  cholecalciferol (VITAMIN D) 1000 units tablet Take 2,000 Units by mouth daily.    [provider]  citalopram (CELEXA) 20 MG tablet Take 1 tablet (20 mg total) by mouth daily.  Start with half a tablet a day for first week then one a day Patient not taking: Reported on 04/18/2017 01/10/17 01/10/18  Thresa RossAkhtar, Nadeem, MD  doxycycline (VIBRAMYCIN) 100 MG capsule Take 1 capsule (100 mg total) by mouth 2 (two) times daily. 04/18/17   Lorre NickAllen, Anthony, MD  fluticasone (FLONASE) 50 MCG/ACT nasal spray Place 1 spray into both nostrils daily. 04/07/17   [provider]  GRALISE 300 MG TABS Take 900 mg by mouth at bedtime. 02/06/17   [provider]  ipratropium-albuterol (DUONEB) 0.5-2.5 (3) MG/3ML SOLN Inhale 3 mLs into the lungs every 4 (four) hours as needed for wheezing or shortness of breath. 04/15/17   [provider]  morphine (MS CONTIN) 30 MG 12 hr tablet Take 30 mg by mouth every 12 (twelve) hours. 04/05/17   [provider]  oxyCODONE-acetaminophen (PERCOCET)  10-325 MG per tablet Take 1 tablet by mouth 4 (four) times daily.    [provider]  PRESCRIPTION MEDICATION Take 1 tablet by mouth daily. Acid Reducer    [provider]  promethazine (PHENERGAN) 25 MG tablet Take 12.5 mg by mouth every 8 (eight) hours as needed for nausea/vomiting. 04/15/17   [provider]  TRELEGY ELLIPTA 100-62.5-25 MCG/INH AEPB Inhale 1 puff into the lungs at bedtime. 03/30/17   [provider]  vitamin B-12 (CYANOCOBALAMIN) 1000 MCG tablet Take 4,000 mcg by mouth daily.    [provider]    Physical Exam: Vitals:   04/21/17 2345 04/22/17 0000 04/22/17 0015 04/22/17 0030  BP: 127/89 123/76 (!) 132/99 126/88  Pulse: (!) 134 (!) 133 (!) 136 (!) 136  Resp: (!) 34 11 18 18   Temp:      TempSrc:      SpO2: 99% 98% 99% 100%      Constitutional: Tachypneic, recruiting accessory muscles. No pallor or diaphoresis.  Eyes: PERTLA, lids and conjunctivae normal ENMT: Mucous membranes are moist. Posterior pharynx clear of any exudate or lesions.   Neck: normal, supple, no masses, no thyromegaly Respiratory: Markedly diminished  bilaterally with end-expiratory wheezing. Accessory muscle use. Speaking 2-3 words. No pallor or cyanosis. Symmetric chest wall expansion.  Cardiovascular: Rate ~120 and regular. No extremity edema. No significant JVD. Abdomen: No distension, no tenderness, no masses palpated. Bowel sounds normal.  Musculoskeletal: no clubbing / cyanosis. No joint deformity upper and lower extremities. Normal muscle tone.  Skin: no significant rashes, lesions, ulcers. Warm, dry, well-perfused. Neurologic: CN 2-12 grossly intact. Sensation intact, DTR normal. Strength 5/5 in all 4 limbs.  Psychiatric: Alert and oriented x 3. Cooperative, pleasant.     Labs on Admission: I have personally reviewed following labs and imaging studies  CBC:  Recent Labs Lab 04/18/17 2115 04/21/17 2332  WBC 9.5 15.5*  NEUTROABS 7.3  --   HGB 13.8 14.8  HCT 40.5 45.9  MCV 88.6 91.4  PLT 309 435*   Basic Metabolic Panel:  Recent Labs Lab 04/18/17 2115 04/21/17 2332  NA 138 142  K 2.9* 3.0*  CL 104 107  CO2 25 23  GLUCOSE 133* 146*  BUN 6 11  CREATININE 0.91 1.04*  CALCIUM 8.5* 9.2   GFR: Estimated Creatinine Clearance: 71.5 mL/min (A) (by C-G formula based on SCr of 1.04 mg/dL (H)). Liver Function Tests:  Recent Labs Lab 04/21/17 2332  AST 38  ALT 32  ALKPHOS 71  BILITOT 0.3  PROT 6.6  ALBUMIN 3.9   No results for input(s): LIPASE, AMYLASE in the last 168 hours. No results for input(s): AMMONIA in the last 168 hours. Coagulation Profile: No results for input(s): INR, PROTIME in the last 168 hours. Cardiac Enzymes:  Recent Labs Lab 04/21/17 2332  TROPONINI <0.03   BNP (last 3 results) No results for input(s): PROBNP in the last 8760 hours. HbA1C: No results for input(s): HGBA1C in the last 72 hours. CBG: No results for input(s): GLUCAP in the last 168 hours. Lipid Profile: No results for input(s): CHOL, HDL, LDLCALC, TRIG, CHOLHDL, LDLDIRECT in the last 72 hours. Thyroid Function  Tests: No results for input(s): TSH, T4TOTAL, FREET4, T3FREE, THYROIDAB in the last 72 hours. Anemia Panel: No results for input(s): VITAMINB12, FOLATE, FERRITIN, TIBC, IRON, RETICCTPCT in the last 72 hours. Urine analysis: No results found for: COLORURINE, APPEARANCEUR, LABSPEC, PHURINE, GLUCOSEU, HGBUR, BILIRUBINUR, KETONESUR, PROTEINUR, UROBILINOGEN, NITRITE, LEUKOCYTESUR Sepsis Labs: @LABRCNTIP (procalcitonin:4,lacticidven:4) )No results found  for this or any previous visit (from the past 240 hour(s)).   Radiological Exams on Admission: Dg Chest Portable 1 View  Result Date: 04/22/2017 CLINICAL DATA:  56 y/o  F; respiratory distress.  Patient on BiPAP. EXAM: PORTABLE CHEST 1 VIEW COMPARISON:  04/18/2017 chest radiograph. FINDINGS: The heart size and mediastinal contours are within normal limits. Hyperinflated lungs compatible with COPD. No focal consolidation, effusion, or pneumothorax per The visualized skeletal structures are unremarkable. Partially visualized anterior cervical fusion hardware. IMPRESSION: No acute pulmonary process identified.  COPD. Electronically Signed   By: Mitzi Hansen M.D.   On: 04/22/2017 00:02    EKG: Independently reviewed. Sinus tachycardia (rate 146), diffuse repolarization abnormality, QTc 593 ms.   Assessment/Plan  1. COPD with acute exacerbation, acute hypoxic respiratory failure  - Pt follows with pulmonology, said to have features of both asthma and COPD, treated with nebs, Trelegy Elipta at home  - Has been increasingly dyspneic with increased cough and production of thick sputum; she has seen her pulmonologist twice during this illness and was taking doxycyline and prednisone at home PTA  - Smoker, no other risk factors for VTE; consider d-dimer if fails to improve with current management as expected  - Saturating 85% for EMS, placed on CPAP, and treated with 125 mg IV Solu-Medrol, 2 g magnesium, and DuoNeb x2 en route  - Placed on BiPAP  in ED and treated with continuous albuterol neb  - She has made improvement in ED, but continues to be symptomatic at rest and is still requiring BiPAP  - Admit to SDU with prn BiPAP, continue systemic steroid, continue nebs, check sputum culture and start azithromycin   2. Hypokalemia  - Serum potassium 3.0 on admission  - Likely secondary to high-doses of albuterol  - QTc is prolonged on admission  - She was treated with 2 g magnesium en route and 30 mEq potassium on admission, given IV as pt on BiPAP   - Monitor on telemetry, repeat chem panel in am    3. Hypertension  - BP at goal  - Continue Norvasc    4. Leukocytosis  - WBC is 15.5 on admission without fever; likely secondary to prednisone use  - Sputum culture requested and azithromycin started in setting of severe exacerbation in COPD   5. Prolonged QT interval  - QTc is 593 ms on admission after 2 neb treatments en route and a 15 mg continuous albuterol treatment in ED  - She was given 2 g magnesium en route and 30 mEq potassium on admission  - Monitor on telemetry, space out neb treatments as tolerated, repeat chem panel in am    DVT prophylaxis: sq Lovenox  Code Status: Full   Family Communication: Son and daughter updated at bedside at patient's request Disposition Plan: Admit to SDU Consults called: None Admission status: Inpatient    Briscoe Deutscher, MD [facility] Pager [phone]  If 7PM-7AM, please contact night-coverage [email or web address] Password [email or web address]  04/22/2017, 1:06 AM

## 2017-04-22 NOTE — Evaluation (Signed)
Physical Therapy Evaluation Patient Details Name: Deanna Yang MRN: [id number] DOB: [birth year] Today's Date: 04/22/2017   History of Present Illness  Patient is a 56 yo female who presents with dypsnea, wheeze and cough. CXR- no acute findings but consistent with COPD.  EKG features a sinus tachycardia with rate 146, diffuse nonspecific repolarization abnormality, and QTc interval of 593 ms. PMH includes COPD, hypertension, and depression.  Clinical Impression  Patient presents with dyspnea at rest, worsened with exertion, decrease in Sp02 and decreased activity tolerance impacting mobility. Tolerated SPT bed to/from [facility] with min guard assist for safety. Pt very anxious and dyspneic post transfer with HR up to 128 bpm and Sp20 ranged from 89-92% on 3L/min 02. RN present to provide breathing treatment. Cues for relaxation and breathing techniques. Pt Mod I PTA using motorized w/c, RW, SPC as needed PRN. Will follow to maximize independence and mobility and review energy conservation techniques so pt can return to PLOF.    Follow Up Recommendations No PT follow up;Supervision - Intermittent    Equipment Recommendations  None recommended by PT    Recommendations for Other Services       Precautions / Restrictions Precautions Precaution Comments: watch 02      Mobility  Bed Mobility Overal bed mobility: Modified Independent             General bed mobility comments: No assist needed. Dypsnea present.  Transfers Overall transfer level: Needs assistance Equipment used: None Transfers: Stand Pivot Transfers   Stand pivot transfers: Min guard       General transfer comment: Min guard for safety as pt anxious due to dyspnea. SPT bed to/from [facility]. Dyspnea 4/4 post transfer to chair requiring cues to slow down breathing and to calm down. RN notified and present. HR up to 128 bpm,. Sp02 89-92% on 3L/min 02.  Ambulation/Gait             General Gait Details: Deferred secondary  to DOE, anxiety.  Stairs            Wheelchair Mobility    Modified Rankin (Stroke Patients Only)       Balance Overall balance assessment: Needs assistance Sitting-balance support: Feet supported;No upper extremity supported Sitting balance-Leahy Scale: Good Sitting balance - Comments: Able to donn underwear sitting on BSC without difficulty.   Standing balance support: During functional activity Standing balance-Leahy Scale: Fair Standing balance comment: Able to stand and pull up pants without assist, but anxious due to dypsnea.                             Pertinent Vitals/Pain Pain Assessment: Faces Faces Pain Scale: Hurts a little bit Pain Location: tightness left ribs/.chest Pain Descriptors / Indicators: Grimacing;Tightness Pain Intervention(s): Monitored during session;Repositioned;Limited activity within patient's tolerance    Home Living Family/patient expects to be discharged to:: Private residence Living Arrangements: Children Available Help at Discharge: Family;Available PRN/intermittently Type of Home: House Home Access: Stairs to enter Entrance Stairs-Rails: Right Entrance Stairs-Number of Steps: 5 Home Layout: One level Home Equipment: Walker - 2 wheels;Cane - single point;Wheelchair - power;Bedside commode      Prior Function Level of Independence: Independent with assistive device(s);Needs assistance   Gait / Transfers Assistance Needed: Uses motorized w/c vs RW vs SPC as needed depending on dyspnea.  ADL's / Homemaking Assistance Needed: Performs on own with increased time and difficulty due to SOB.   Comments: Drives. CHildren live with her  but are not home all the time.     Hand Dominance        Extremity/Trunk Assessment   Upper Extremity Assessment Upper Extremity Assessment: Defer to OT evaluation    Lower Extremity Assessment Lower Extremity Assessment: Overall WFL for tasks assessed    Cervical / Trunk  Assessment Cervical / Trunk Assessment: Normal  Communication   Communication: No difficulties  Cognition Arousal/Alertness: Awake/alert Behavior During Therapy: Anxious Overall Cognitive Status: Within Functional Limits for tasks assessed                                 General Comments: Very anxious about being able to breathe.      General Comments General comments (skin integrity, edema, etc.): Audible wheezing. Mother present during session.    Exercises     Assessment/Plan    PT Assessment Patient needs continued PT services  PT Problem List Decreased mobility;Cardiopulmonary status limiting activity;Decreased activity tolerance       PT Treatment Interventions Therapeutic activities;Gait training;Therapeutic exercise;Patient/family education;Balance training;Functional mobility training;Stair training    PT Goals (Current goals can be found in the Care Plan section)  Acute Rehab PT Goals Patient Stated Goal: to be able to breathe PT Goal Formulation: With patient Time For Goal Achievement: 05/06/17 Potential to Achieve Goals: Fair    Frequency Min 3X/week   Barriers to discharge Decreased caregiver support      Co-evaluation               AM-PAC PT "6 Clicks" Daily Activity  Outcome Measure Difficulty turning over in bed (including adjusting bedclothes, sheets and blankets)?: None Difficulty moving from lying on back to sitting on the side of the bed? : None Difficulty sitting down on and standing up from a chair with arms (e.g., wheelchair, bedside commode, etc,.)?: None Help needed moving to and from a bed to chair (including a wheelchair)?: A Little Help needed walking in hospital room?: A Little Help needed climbing 3-5 steps with a railing? : A Lot 6 Click Score: 20    End of Session Equipment Utilized During Treatment: Oxygen Activity Tolerance: Other (comment);Treatment limited secondary to medical complications (Comment)  (dyspnea, anxiety) Patient left: in bed;with call bell/phone within reach;with family/visitor present Nurse Communication: Mobility status PT Visit Diagnosis: Other (comment) (dyspnea)    Time: [id number] PT Time Calculation (min) (ACUTE ONLY): 30 min   Charges:   PT Evaluation $PT Eval Moderate Complexity: 1 Procedure PT Treatments $Therapeutic Activity: 8-22 mins   PT G Codes:        Mylo RedShauna Giovanne Nickolson, PT, DPT [phone]    Blake DivineShauna A Quintara Bost 04/22/2017, 9:26 AM

## 2017-04-22 NOTE — Progress Notes (Signed)
MEDICATION RELATED NOTE   Pharmacy re:  Promethazine  Assessment: 56yo female with history of QTc prolongation.  She has been ordered Promethazine (which she takes at home) and states this is most effective for her.  Incidence in the literature of potential QTc prolongation noted.  Have discussed this with her nurse who will relay information to provider prior to given the first dose.   Plan:  Consider risk/benefit and order as you feel appropriate.   Medications:  Prescriptions Prior to Admission  Medication Sig Dispense Refill Last Dose  . albuterol (PROVENTIL HFA;VENTOLIN HFA) 108 (90 BASE) MCG/ACT inhaler Inhale 2 puffs into the lungs every 6 (six) hours as needed for wheezing.    04/18/2017 at Unknown time  . amLODipine (NORVASC) 5 MG tablet Take 5 mg by mouth 2 (two) times daily.    04/18/2017 at Unknown time  . baclofen (LIORESAL) 10 MG tablet Take 5 mg by mouth 3 (three) times daily.  3 04/17/2017 at Unknown time  . cetirizine (ZYRTEC) 10 MG tablet Take 10 mg by mouth daily.   04/17/2017 at Unknown time  . cholecalciferol (VITAMIN D) 1000 units tablet Take 2,000 Units by mouth daily.   04/17/2017 at Unknown time  . citalopram (CELEXA) 20 MG tablet Take 1 tablet (20 mg total) by mouth daily. Start with half a tablet a day for first week then one a day (Patient not taking: Reported on 04/18/2017) 30 tablet 0 Not Taking at Unknown time  . doxycycline (VIBRAMYCIN) 100 MG capsule Take 1 capsule (100 mg total) by mouth 2 (two) times daily. 20 capsule 0   . fluticasone (FLONASE) 50 MCG/ACT nasal spray Place 1 spray into both nostrils daily.  5 04/18/2017 at Unknown time  . GRALISE 300 MG TABS Take 900 mg by mouth at bedtime.  2 Past Week at Unknown time  . ipratropium-albuterol (DUONEB) 0.5-2.5 (3) MG/3ML SOLN Inhale 3 mLs into the lungs every 4 (four) hours as needed for wheezing or shortness of breath.  11 04/18/2017 at Unknown time  . morphine (MS CONTIN) 30 MG 12 hr tablet Take 30 mg by mouth every 12  (twelve) hours.  0 04/18/2017 at 0600  . oxyCODONE-acetaminophen (PERCOCET) 10-325 MG per tablet Take 1 tablet by mouth 4 (four) times daily.   04/18/2017 at Unknown time  . PRESCRIPTION MEDICATION Take 1 tablet by mouth daily. Acid Reducer   Unknown  . promethazine (PHENERGAN) 25 MG tablet Take 12.5 mg by mouth every 8 (eight) hours as needed for nausea/vomiting.  4 04/18/17  . TRELEGY ELLIPTA 100-62.5-25 MCG/INH AEPB Inhale 1 puff into the lungs at bedtime.  11 04/17/2017 at Unknown time  . vitamin B-12 (CYANOCOBALAMIN) 1000 MCG tablet Take 4,000 mcg by mouth daily.   04/17/2017 at Unknown time    Nadara MustardNita Jonny Longino, PharmD., MS Clinical Pharmacist Pager:  [phone] Thank you for allowing pharmacy to be part of this patients care team. 04/22/2017,10:09 AM

## 2017-04-22 NOTE — Progress Notes (Signed)
CSW c/s for COPD Gold Protocol acknowledged. Pt with one admission in 6 months, noted.  Pt to return home at d/c with no f/u PT being recommended.  No other social work needs identified.  CSW signing off.  Pollyann SavoyJody Talin Rozeboom, LCSW Weekend Coverage [phone]

## 2017-04-22 NOTE — Progress Notes (Signed)
Patient has no new complaints. Continue A/P of HPI as per initial evaluation overnight.  Continue current medical management  Will reassess next am.  Maijor Hornig, Pamala HurryLANDO  Patient had some anxiety as such was given low dose IV ativan  Gen: pt in nad, alert and awake cv: s1 and s2, no rubs Pulm: equal chest rise, wheezes

## 2017-04-22 NOTE — Progress Notes (Signed)
Notified Md.  Pt woke up couldn't breath and got sick.  RT placed on NRB. Pt has sleep apnea, had study and stated " getting fit for machine on Monday" New orders received. Will continue to monitor. Karena Addisonoro, Jaelie Aguilera T

## 2017-04-22 NOTE — ED Notes (Signed)
Pt requested to remove bipap mask for a moment to wet her mouth. Resp in room, mask removed and this RN provided pt with moistened mouth swab. Pt feels better now and bipap mask back on. Pt still short of breath, but improving.

## 2017-04-22 NOTE — Progress Notes (Signed)
Nutrition Brief Note  RD consulted for assessment of nutrition needs/status per COPD GOLD protocol.   Wt Readings from Last 15 Encounters:  04/22/17 199 lb 8.3 oz (90.5 kg)  04/18/17 200 lb (90.7 kg)  04/05/17 200 lb (90.7 kg)  02/05/17 193 lb (87.5 kg)   Deanna Yang is a 56 y.o. female with medical history significant for COPD, hypertension, and depression who presents to the emergency department for evaluation of progressive dyspnea, cough, and wheeze. Patient reports that she had been in her usual state until 10-14 days ago when she noted the insidious development of worsening in her chronic dyspnea and increase in her chronic cough.   Pt admitted with COPD exacerbation.   Pt somnolent at time of visit. Hx obtained from pt's mother, who reports pt who poor appetite over the past 5-7 days PTA. Typically pt has a very good appetite and pt mother indicates that intake has improved since hospitalization due to improved respiratory status. Pt consumed 75% of breakfast tray per RD observation (all but grits).   Pt mother unsure of UBW, but denies weight loss. Mother states that pt has actually progressively gained weight due to steroids. Pt with large, boxy frame at baseline.   Nutrition-Focused physical exam completed. Findings are no fat depletion, no muscle depletion, and no edema.   Pt mother denies further nutritional needs, however, expressed appreciation for visit.    Body mass index is 31.25 kg/m. Patient meets criteria for obesity, class I based on current BMI.   Current diet order is regular, patient is consuming approximately 75% of meals at this time. Labs and medications reviewed.   No nutrition interventions warranted at this time. If nutrition issues arise, please consult RD.   Deanna Yang A. Mayford KnifeWilliams, RD, LDN, CDE Pager: [phone] After hours Pager: [phone]

## 2017-04-23 ENCOUNTER — Inpatient Hospital Stay (HOSPITAL_COMMUNITY): Payer: Medicare Other

## 2017-04-23 LAB — BASIC METABOLIC PANEL
ANION GAP: 9 (ref 5–15)
BUN: 14 mg/dL (ref 6–20)
CHLORIDE: 104 mmol/L (ref 101–111)
CO2: 25 mmol/L (ref 22–32)
Calcium: 9.2 mg/dL (ref 8.9–10.3)
Creatinine, Ser: 1.1 mg/dL — ABNORMAL HIGH (ref 0.44–1.00)
GFR calc non Af Amer: 56 mL/min — ABNORMAL LOW (ref >60)
Glucose, Bld: 145 mg/dL — ABNORMAL HIGH (ref 65–99)
POTASSIUM: 4.1 mmol/L (ref 3.5–5.1)
Sodium: 138 mmol/L (ref 135–145)

## 2017-04-23 LAB — CBC
HCT: 43.1 % (ref 36.0–46.0)
HEMOGLOBIN: 14.1 g/dL (ref 12.0–15.0)
MCH: 29.7 pg (ref 26.0–34.0)
MCHC: 32.7 g/dL (ref 30.0–36.0)
MCV: 90.9 fL (ref 78.0–100.0)
Platelets: 393 10*3/uL (ref 150–400)
RBC: 4.74 MIL/uL (ref 3.87–5.11)
RDW: 14.4 % (ref 11.5–15.5)
WBC: 19 10*3/uL — ABNORMAL HIGH (ref 4.0–10.5)

## 2017-04-23 LAB — GLUCOSE, CAPILLARY: Glucose-Capillary: 169 mg/dL — ABNORMAL HIGH (ref 65–99)

## 2017-04-23 MED ORDER — DM-GUAIFENESIN ER 30-600 MG PO TB12
1.0000 | ORAL_TABLET | Freq: Two times a day (BID) | ORAL | Status: DC
  Administered 2017-04-23 – 2017-04-28 (×11): 1 via ORAL
  Filled 2017-04-23 (×11): qty 1

## 2017-04-23 MED ORDER — NICOTINE 21 MG/24HR TD PT24
21.0000 mg | MEDICATED_PATCH | Freq: Every day | TRANSDERMAL | Status: DC
  Administered 2017-04-23 – 2017-04-25 (×3): 21 mg via TRANSDERMAL
  Filled 2017-04-23 (×3): qty 1

## 2017-04-23 MED ORDER — LOSARTAN POTASSIUM 50 MG PO TABS
100.0000 mg | ORAL_TABLET | Freq: Every day | ORAL | Status: DC
  Administered 2017-04-23 – 2017-04-28 (×6): 100 mg via ORAL
  Filled 2017-04-23 (×6): qty 2

## 2017-04-23 MED ORDER — HYDROXYZINE HCL 25 MG PO TABS
25.0000 mg | ORAL_TABLET | Freq: Once | ORAL | Status: AC
  Administered 2017-04-23: 25 mg via ORAL

## 2017-04-23 MED ORDER — ALPRAZOLAM 0.25 MG PO TABS
0.2500 mg | ORAL_TABLET | Freq: Once | ORAL | Status: AC
  Administered 2017-04-23: 0.25 mg via ORAL
  Filled 2017-04-23: qty 1

## 2017-04-23 MED ORDER — LORAZEPAM 2 MG/ML IJ SOLN
0.5000 mg | INTRAMUSCULAR | Status: DC | PRN
  Administered 2017-04-23 – 2017-04-26 (×13): 0.5 mg via INTRAVENOUS
  Filled 2017-04-23 (×13): qty 1

## 2017-04-23 MED ORDER — METHYLPREDNISOLONE SODIUM SUCC 125 MG IJ SOLR
80.0000 mg | Freq: Four times a day (QID) | INTRAMUSCULAR | Status: DC
  Administered 2017-04-23 – 2017-04-26 (×11): 80 mg via INTRAVENOUS
  Filled 2017-04-23 (×11): qty 2

## 2017-04-23 NOTE — Significant Event (Signed)
Rapid Response Event Note   Paged by hospital operator tao report to [phone].    Overview: Time Called: 2010 Arrival Time: 2013 Event Type: Respiratory  Initial Focused Assessment:     Upon arrival to bedside pt noted to be in severe respiratory  Distress with audible wheezing heard at doorway.Marland Kitchen. Pt awake, highly anxious and crying.  States that water from  her oxygen tubing went up her nose and she vomited into her treatment mask while receiving her duoneb treatment at 1410 hrs.  RR 36 with use of abdominal muscles.  Bilalteral BS  EW in upper airways and diminished in bilateral bases. 135/96  130 ST  98.3  97% O2sat on 6l [place].  C/O 9/10   Bilateral chest pain, sharp, constant , worse with cough.  Dr Antionette Charpyd at bedside.  Will transfer to SDU   Interventions: Ativan .5mg  IV given for anxiety .Mother at bedside updated on POC.  Treat CP as ordered.  2100  135/84  16  95% 116 ST   Hand off report given to Amy, RN  Will follow as needed  Plan of Care (if not transferred):  Event Summary: Name of Physician Notified: Dr Antionette Charpyd at 2005    at    Outcome: Transferred (Comment) [phone]([id number])  Event End Time: 2045  Jules Schickrivette, Roshan Salamon K

## 2017-04-23 NOTE — Progress Notes (Signed)
RT heard pt coughing from across unit, upon arrival to pt's room, Pt very anxious, increased SOB, increased HR. Pt placed on 100% NRB to help calm her. Pt weaned back down to [place], but stated she was still feeling SOB. Encouraged pt to take slow, deep breaths. Pt changed over to 6L HFNC ([place]). Pt appearing much more comfortable on HFNC. Pt with very high anxiety. Pt had sleep study done, still needs to be fitted for mask. Order for CPAP QHS received. RT will continue to monitor.                                

## 2017-04-23 NOTE — Progress Notes (Signed)
[facility] Patient currently eating lunch. Will transfer after lunch.

## 2017-04-23 NOTE — Progress Notes (Addendum)
Patient having anxiety episodes with difficulty breathing despite breathing treatment, pain and anxiety meds. MD paged

## 2017-04-23 NOTE — Progress Notes (Signed)
Rt called from RN that pt was given a PRN for Respiratory distress and wheezing.   Rt entered to find pt extremely tac=hy and wheezing and unable to complete sentences.  RT initiated another tx at this time.  Pt stated she sucked water into her nose and mouth during the last tx and was very anxious about it.  RR was called and pt is now appearing to feel a little better.  Pt is on plan to transfer.  Rt will monitor.

## 2017-04-23 NOTE — Progress Notes (Signed)
Called to bedside for pt in acute resp distress. Rapid response RN and RT at bedside assessing. Water canister apparently spilled into O2 tubing and pt aspirated just prior to sx-onset. Had just been transferred out of SDU. Will transfer back to SDU for acute respiratory distress secondary to aspiration.

## 2017-04-23 NOTE — Progress Notes (Signed)
Pt very anxious even after prn po medication given.  Pt on cpap and feels like having a panic attack.  Md paged. Reassured pt.  Will continue to monitor. Karena Addisonoro, Melven Stockard T

## 2017-04-23 NOTE — Progress Notes (Signed)
During shift change, patient was complaining of SOB with wheezing.,anxiety. She received Duoneb per PRN order with no release. MD was paged and made aware about her condition. Will continue to monitor.

## 2017-04-23 NOTE — Progress Notes (Signed)
Pt tolerating scheduled neb tx fine, but then started coughing, and saying she couldn't breathe. RT stopped tx, and turned up flow meter for comfort until patient can calm down. Pt with NPC at this time, pt very anxious. RT will continue to monitor.

## 2017-04-23 NOTE — Progress Notes (Signed)
PROGRESS NOTE    Deanna Yang  NWG:[id number] DOB: [birth year] DOA: 04/21/2017 PCP: Salli Quarry, PA-C   Brief Narrative:   56 y.o. female with medical history significant for COPD, hypertension, and depression who presents to the emergency department for evaluation of progressive dyspnea, cough, and wheeze. Patient reports that she had been in her usual state until 10-14 days ago when she noted the insidious development of worsening in her chronic dyspnea and increase in her chronic cough  Assessment & Plan:   Principal Problem:   COPD with acute exacerbation (HCC) -Continue current medical regimen. Will increase steroid regimen given continued wheezes - DuoNeb nebs  Active Problems:   Essential (primary) hypertension - restart patient on ARB    Acute respiratory failure with hypoxia (HCC) -  Continue supplemental oxygen as needed.    Hypokalemia - resolved    Leukocytosis   Prolonged QT interval - resolved based on last EKG  DVT prophylaxis: Lovenox Code Status: Full Family Communication: Discussed with patient and mother at bedside Disposition Plan: Pending improvement in respiratory condition   Consultants:   None   Procedures: None   Antimicrobials: azithromycin   Subjective: Pt has no new complaints. No acute issues overnight.  Objective: Vitals:   04/23/17 0357 04/23/17 0707 04/23/17 0837 04/23/17 1100  BP:  (!) 142/86  (!) 150/92  Pulse:    (!) 108  Resp:  14  20  Temp:  98.2 F (36.8 C)  98.2 F (36.8 C)  TempSrc:  Axillary  Oral  SpO2:   95% 95%  Weight: 92.9 kg (204 lb 12.8 oz)     Height:        Intake/Output Summary (Last 24 hours) at 04/23/17 1516 Last data filed at 04/23/17 0400  Gross per 24 hour  Intake              610 ml  Output             1275 ml  Net             -665 ml   Filed Weights   04/22/17 0148 04/22/17 2000 04/23/17 0357  Weight: 90.5 kg (199 lb 8.3 oz) 90.5 kg (199 lb 8.3 oz) 92.9 kg (204 lb 12.8 oz)     Examination:  General exam: Appears calm and comfortable, in nad. Respiratory system: equal chest rise, expiratory wheezes, prolonged expiratory phase Cardiovascular system: S1 & S2 heard, RRR.  Gastrointestinal system: Abdomen is nondistended, soft and nontender. No organomegaly or masses felt. Normal bowel sounds heard. Central nervous system: Alert and oriented. No focal neurological deficits. Extremities: Symmetric 5 x 5 power. Skin: No rashes, lesions or ulcers, on limited exam Psychiatry:  Mood & affect appropriate.   Data Reviewed: I have personally reviewed following labs and imaging studies  CBC:  Recent Labs Lab 04/18/17 2115 04/21/17 2332 04/23/17 1308  WBC 9.5 15.5* 19.0*  NEUTROABS 7.3  --   --   HGB 13.8 14.8 14.1  HCT 40.5 45.9 43.1  MCV 88.6 91.4 90.9  PLT 309 435* 393   Basic Metabolic Panel:  Recent Labs Lab 04/18/17 2115 04/21/17 2332 04/22/17 0411 04/23/17 1308  NA 138 142 139 138  K 2.9* 3.0* 3.1* 4.1  CL 104 107 104 104  CO2 25 23 21* 25  GLUCOSE 133* 146* 291* 145*  BUN 6 11 10 14   CREATININE 0.91 1.04* 1.22* 1.10*  CALCIUM 8.5* 9.2 8.6* 9.2   GFR: Estimated Creatinine Clearance: 68.4 mL/min (A) (  by C-G formula based on SCr of 1.1 mg/dL (H)). Liver Function Tests:  Recent Labs Lab 04/21/17 2332  AST 38  ALT 32  ALKPHOS 71  BILITOT 0.3  PROT 6.6  ALBUMIN 3.9   No results for input(s): LIPASE, AMYLASE in the last 168 hours. No results for input(s): AMMONIA in the last 168 hours. Coagulation Profile: No results for input(s): INR, PROTIME in the last 168 hours. Cardiac Enzymes:  Recent Labs Lab 04/21/17 2332 04/22/17 0836  TROPONINI <0.03 <0.03   BNP (last 3 results) No results for input(s): PROBNP in the last 8760 hours. HbA1C: No results for input(s): HGBA1C in the last 72 hours. CBG:  Recent Labs Lab 04/22/17 0753 04/23/17 0741  GLUCAP 244* 169*   Lipid Profile: No results for input(s): CHOL, HDL, LDLCALC,  TRIG, CHOLHDL, LDLDIRECT in the last 72 hours. Thyroid Function Tests:  Recent Labs  04/22/17 0411  TSH 0.306*   Anemia Panel: No results for input(s): VITAMINB12, FOLATE, FERRITIN, TIBC, IRON, RETICCTPCT in the last 72 hours. Sepsis Labs: No results for input(s): PROCALCITON, LATICACIDVEN in the last 168 hours.  Recent Results (from the past 240 hour(s))  MRSA PCR Screening     Status: None   Collection Time: 04/22/17  1:52 AM  Result Value Ref Range Status   MRSA by PCR NEGATIVE NEGATIVE Final    Comment:        The GeneXpert MRSA Assay (FDA approved for NASAL specimens only), is one component of a comprehensive MRSA colonization surveillance program. It is not intended to diagnose MRSA infection nor to guide or monitor treatment for MRSA infections.          Radiology Studies: Dg Chest Port 1 View  Result Date: 04/22/2017 CLINICAL DATA:  Initial evaluation for acute aspiration. EXAM: PORTABLE CHEST 1 VIEW COMPARISON:  Prior radiograph from 04/22/2017. FINDINGS: Cardiac and mediastinal silhouettes are stable in size and contour, and remain within normal limits. Lungs mildly hypoinflated. Changes related COPD noted. No focal infiltrates to suggest acute infectious or aspiration pneumonitis. No pulmonary edema or pleural effusion. No pneumothorax. No acute osseus abnormality.  Cervical ACDF noted. IMPRESSION: 1. No active cardiopulmonary disease. No radiographic evidence for active for a shin or infectious pneumonitis. 2. COPD. Electronically Signed   By: Rise MuBenjamin  McClintock M.D.   On: 04/22/2017 22:37   Dg Chest Portable 1 View  Result Date: 04/22/2017 CLINICAL DATA:  56 y/o  F; respiratory distress.  Patient on BiPAP. EXAM: PORTABLE CHEST 1 VIEW COMPARISON:  04/18/2017 chest radiograph. FINDINGS: The heart size and mediastinal contours are within normal limits. Hyperinflated lungs compatible with COPD. No focal consolidation, effusion, or pneumothorax per The visualized  skeletal structures are unremarkable. Partially visualized anterior cervical fusion hardware. IMPRESSION: No acute pulmonary process identified.  COPD. Electronically Signed   By: Mitzi HansenLance  Furusawa-Stratton M.D.   On: 04/22/2017 00:02        Scheduled Meds: . baclofen  5 mg Oral TID  . cholecalciferol  2,000 Units Oral Daily  . dextromethorphan-guaiFENesin  1 tablet Oral BID  . enoxaparin (LOVENOX) injection  40 mg Subcutaneous Q24H  . fluticasone  1 spray Each Nare Daily  . Fluticasone-Umeclidin-Vilant  1 puff Inhalation QHS  . gabapentin  300 mg Oral TID  . ipratropium-albuterol  3 mL Nebulization Q6H  . loratadine  10 mg Oral Daily  . methylPREDNISolone (SOLU-MEDROL) injection  80 mg Intravenous Q6H  . morphine  30 mg Oral Q12H  . nicotine  21 mg Transdermal  Daily  . sodium chloride flush  3 mL Intravenous Q12H  . sodium chloride flush  3 mL Intravenous Q12H  . vitamin B-12  1,000 mcg Oral Daily   Continuous Infusions: . sodium chloride    . azithromycin Stopped (04/23/17 0210)     LOS: 1 day    Time spent: > 35 minutes   Penny Pia, MD [facility] Pager [phone]  If 7PM-7AM, please contact night-coverage [email or web address] Password [facility] 04/23/2017, 3:16 PM

## 2017-04-23 NOTE — Progress Notes (Signed)
Patient trasfered from 4N to [phone] via wheelchair; alert and oriented x 4; no complaints of pain; IV saline locked in LAC. Orient patient to room and unit; instructed how to use the call bell and  fall risk precautions. Will continue to monitor the patient.

## 2017-04-23 NOTE — Progress Notes (Signed)
RT heard pt coughing from across unit, upon arrival to pt's room, Pt very anxious, increased SOB, increased HR. Pt placed on 100% NRB to help calm her. Pt weaned back down to , but stated she was still feeling SOB. Encouraged pt to take slow, deep breaths. Pt changed over to 6L HFNC ([place]). Pt appearing much more comfortable on HFNC. Pt with very high anxiety. Pt had sleep study done, still needs to be fitted for mask. Order for CPAP QHS received. RT will continue to monitor.

## 2017-04-23 NOTE — Progress Notes (Signed)
Attempted to call report for patient being transferred to 4E. Nurse was unable to take report at this time, she will call me. I went to room, Rapid Respons nurse and Charge nurse is in the process of transporting patient.  Attempted to call [phone] again with no answer.

## 2017-04-24 DIAGNOSIS — F17210 Nicotine dependence, cigarettes, uncomplicated: Secondary | ICD-10-CM

## 2017-04-24 DIAGNOSIS — Z818 Family history of other mental and behavioral disorders: Secondary | ICD-10-CM

## 2017-04-24 DIAGNOSIS — Z810 Family history of intellectual disabilities: Secondary | ICD-10-CM

## 2017-04-24 DIAGNOSIS — Z811 Family history of alcohol abuse and dependence: Secondary | ICD-10-CM

## 2017-04-24 DIAGNOSIS — F332 Major depressive disorder, recurrent severe without psychotic features: Secondary | ICD-10-CM

## 2017-04-24 LAB — GLUCOSE, CAPILLARY: GLUCOSE-CAPILLARY: 182 mg/dL — AB (ref 65–99)

## 2017-04-24 MED ORDER — BENZONATATE 100 MG PO CAPS
100.0000 mg | ORAL_CAPSULE | Freq: Three times a day (TID) | ORAL | Status: DC | PRN
  Administered 2017-04-24: 100 mg via ORAL
  Filled 2017-04-24: qty 1

## 2017-04-24 MED ORDER — DOXYCYCLINE HYCLATE 100 MG IV SOLR
100.0000 mg | Freq: Two times a day (BID) | INTRAVENOUS | Status: DC
  Administered 2017-04-24 – 2017-04-26 (×6): 100 mg via INTRAVENOUS
  Filled 2017-04-24 (×8): qty 100

## 2017-04-24 MED ORDER — HYDRALAZINE HCL 20 MG/ML IJ SOLN
5.0000 mg | Freq: Four times a day (QID) | INTRAMUSCULAR | Status: DC | PRN
  Administered 2017-04-27: 5 mg via INTRAVENOUS
  Filled 2017-04-24: qty 1

## 2017-04-24 MED ORDER — BUPROPION HCL ER (XL) 150 MG PO TB24
150.0000 mg | ORAL_TABLET | Freq: Every day | ORAL | Status: DC
  Administered 2017-04-24 – 2017-04-27 (×4): 150 mg via ORAL
  Filled 2017-04-24 (×4): qty 1

## 2017-04-24 MED ORDER — GABAPENTIN 300 MG PO CAPS
600.0000 mg | ORAL_CAPSULE | Freq: Three times a day (TID) | ORAL | Status: DC
  Administered 2017-04-24 – 2017-04-28 (×13): 600 mg via ORAL
  Filled 2017-04-24 (×13): qty 2

## 2017-04-24 NOTE — Progress Notes (Addendum)
Physical Therapy Treatment Patient Details Name: Deanna Yang MRN: [id number] DOB: 10/11/1961 Today's Date: 04/24/2017    History of Present Illness Patient is a 56 yo female who presents with dypsnea, wheeze and cough. CXR- no acute findings but consistent with COPD.  EKG features a sinus tachycardia with rate 146, diffuse nonspecific repolarization abnormality, and QTc interval of 593 ms. PMH includes COPD, hypertension, and depression.    PT Comments    Patient tolerated transfer to recliner with short distance gait of ~492ft. Limited by dyspnea and anxiety with dyspnea. Pt is slow to progress toward mobility goals and with decreased caregiver assistance at home. Pt on HFNC throughout session. Need to work on increased gait and energy conservation techniques next session.  Recommending [facility]-SNF for further skilled PT services to maximize independence and safety with mobility prior to d/c home.   Follow Up Recommendations  SNF     Equipment Recommendations  None recommended by PT    Recommendations for Other Services       Precautions / Restrictions Precautions Precaution Comments: watch 02    Mobility  Bed Mobility Overal bed mobility: Modified Independent             General bed mobility comments: increased time and effort  Transfers Overall transfer level: Needs assistance Equipment used: Rolling walker (2 wheeled) Transfers: Stand Pivot Transfers;Sit to/from Stand Sit to Stand: Min guard Stand pivot transfers: Min guard       General transfer comment: min guard for safety; cues for safety and breathing technique  Ambulation/Gait Ambulation/Gait assistance: Min guard Ambulation Distance (Feet): 2 Feet Assistive device: Rolling walker (2 wheeled) Gait Pattern/deviations: Step-through pattern;Decreased stride length;Trunk flexed     General Gait Details: limited due to DOE; SpO2 down to 90% on HFNC   Stairs            Wheelchair Mobility    Modified  Rankin (Stroke Patients Only)       Balance                                            Cognition Arousal/Alertness: Awake/alert Behavior During Therapy: WFL for tasks assessed/performed Overall Cognitive Status: Within Functional Limits for tasks assessed                                        Exercises      General Comments General comments (skin integrity, edema, etc.): mother present during session      Pertinent Vitals/Pain Pain Assessment: Faces Faces Pain Scale: Hurts even more Pain Location: right Lower abdomen/groin area in sitting Pain Descriptors / Indicators: Grimacing;Tightness;Sharp Pain Intervention(s): Limited activity within patient's tolerance;Monitored during session;Repositioned    Home Living                      Prior Function            PT Goals (current goals can now be found in the care plan section) Progress towards PT goals: Progressing toward goals    Frequency    Min 3X/week      PT Plan Current plan remains appropriate    Co-evaluation              AM-PAC PT "6 Clicks" Daily Activity  Outcome Measure  Difficulty turning over in bed (including adjusting bedclothes, sheets and blankets)?: None Difficulty moving from lying on back to sitting on the side of the bed? : None Difficulty sitting down on and standing up from a chair with arms (e.g., wheelchair, bedside commode, etc,.)?: None Help needed moving to and from a bed to chair (including a wheelchair)?: A Little Help needed walking in hospital room?: A Little Help needed climbing 3-5 steps with a railing? : A Lot 6 Click Score: 20    End of Session Equipment Utilized During Treatment: Oxygen;Gait belt Activity Tolerance: Patient tolerated treatment well Patient left: in chair;with call bell/phone within reach;with family/visitor present Nurse Communication: Mobility status PT Visit Diagnosis: Other (comment) (dyspnea)      Time: 1410-1443 PT Time Calculation (min) (ACUTE ONLY): 33 min  Charges:  $Therapeutic Activity: 23-37 mins                    G Codes:       Erline Levine, PTA Pager: [phone]     Carolynne Edouard 04/24/2017, 2:54 PM

## 2017-04-24 NOTE — Evaluation (Signed)
Occupational Therapy Evaluation Patient Details Name: Deanna MansDolores Yang MRN: [id number] DOB: 04-02-1961 Today's Date: 04/24/2017    History of Present Illness Patient is a 56 yo female who presents with dypsnea, wheeze and cough. CXR- no acute findings but consistent with COPD.  EKG features a sinus tachycardia with rate 146, diffuse nonspecific repolarization abnormality, and QTc interval of 593 ms. PMH includes COPD, hypertension, and depression.   Clinical Impression   This 56 yo female admitted with above presents to acute OT with deficits below (see OT problem list) thus affecting her PLOF of being Mod I with all her basic ADLs. She will benefit from acute OT with follow up OT at SNF to work back towards her PLOF.    Follow Up Recommendations  SNF;Supervision/Assistance - 24 hour    Equipment Recommendations  Other (comment) (TBD at next venue)       Precautions / Restrictions Precautions Precaution Comments: watch 02 and pt get anxious with movement due to increased work of breathing Restrictions Weight Bearing Restrictions: No      Mobility Bed Mobility Overal bed mobility: Modified Independent             General bed mobility comments: Pt up in recliner upon arrival  Transfers Overall transfer level: Needs assistance Equipment used: Rolling walker (2 wheeled) Transfers: Sit to/from Stand Sit to Stand: Min assist Stand pivot transfers: Min guard       General transfer comment: min guard for safety; cues for safety and breathing technique    Balance Overall balance assessment: Needs assistance Sitting-balance support: Feet supported;No upper extremity supported       Standing balance support: During functional activity;Single extremity supported                               ADL either performed or assessed with clinical judgement   ADL Overall ADL's : Needs assistance/impaired Eating/Feeding: Independent;Sitting   Grooming: Set up;Sitting   Upper Body Bathing: Set up;Supervision/ safety;Sitting   Lower Body Bathing: Minimal assistance;Sit to/from stand Lower Body Bathing Details (indicate cue type and reason): pt can cross her legs to get to her feet Upper Body Dressing : Moderate assistance;Sitting   Lower Body Dressing: Moderate assistance (min A sit<>stand) Lower Body Dressing Details (indicate cue type and reason): pt can cross her legs to get to her feet Toilet Transfer: Minimal assistance;Stand-pivot;BSC   Toileting- Clothing Manipulation and Hygiene: Moderate assistance (min A sit<>stand)         General ADL Comments: All activities take increased time due to increased work of breathing with any movement and even with talking     Vision Patient Visual Report: No change from baseline              Pertinent Vitals/Pain Pain Assessment: Faces Faces Pain Scale: Hurts even more Pain Location: right Lower abdomen/groin area in sitting Pain Descriptors / Indicators: Grimacing (grabbing) Pain Intervention(s): Limited activity within patient's tolerance;Monitored during session     Hand Dominance Right   Extremity/Trunk Assessment Upper Extremity Assessment Upper Extremity Assessment: Overall WFL for tasks assessed           Communication Communication Communication: No difficulties   Cognition Arousal/Alertness: Awake/alert Behavior During Therapy: WFL for tasks assessed/performed Overall Cognitive Status: Within Functional Limits for tasks assessed  General Comments: Very anxious about being able to breathe.   General Comments  mother present during session            Home Living Family/patient expects to be discharged to:: Skilled nursing facility Living Arrangements: Children Available Help at Discharge: Family;Available PRN/intermittently Type of Home: House Home Access: Stairs to enter Entrance Stairs-Number of Steps: 5 Entrance  Stairs-Rails: Right Home Layout: One level     Bathroom Shower/Tub: Walk-in shower         Home Equipment: Environmental consultant - 2 wheels;Cane - single point;Wheelchair - power;Bedside commode          Prior Functioning/Environment Level of Independence: Independent with assistive device(s);Needs assistance  Gait / Transfers Assistance Needed: Uses motorized w/c vs RW vs SPC as needed depending on dyspnea. ADL's / Homemaking Assistance Needed: Performs on own with increased time and difficulty due to SOB.    Comments: Drives. CHildren live with her but are not home all the time.        OT Problem List: Decreased activity tolerance;Impaired balance (sitting and/or standing);Cardiopulmonary status limiting activity;Pain;Obesity      OT Treatment/Interventions: Self-care/ADL training;Therapeutic activities;Patient/family education;Balance training;DME and/or AE instruction;Energy conservation    OT Goals(Current goals can be found in the care plan section) Acute Rehab OT Goals Patient Stated Goal: to be able to breathe and take care of myself again OT Goal Formulation: With patient Time For Goal Achievement: 05/08/17 Potential to Achieve Goals: Good  OT Frequency: Min 2X/week   Barriers to D/C: Decreased caregiver support             AM-PAC PT "6 Clicks" Daily Activity     Outcome Measure Help from another person eating meals?: None Help from another person taking care of personal grooming?: None Help from another person toileting, which includes using toliet, bedpan, or urinal?: A Little Help from another person bathing (including washing, rinsing, drying)?: A Little Help from another person to put on and taking off regular upper body clothing?: A Little Help from another person to put on and taking off regular lower body clothing?: A Lot 6 Click Score: 19   End of Session    Activity Tolerance: Other (comment) (limited by increased work of breathing) Patient left: in  chair;with call bell/phone within reach;with chair alarm set  OT Visit Diagnosis: Unsteadiness on feet (R26.81);Other (comment) (cardio-pulmonary issues)                Time: 1610-9604 OT Time Calculation (min): 17 min Charges:  OT General Charges $OT Visit: 1 Procedure OT Evaluation $OT Eval Moderate Complexity: 1 Procedure  Ignacia Palma, OTR/L [phone] 04/24/2017

## 2017-04-24 NOTE — Progress Notes (Signed)
PROGRESS NOTE    Deanna Yang  WJX:[id number] DOB: 1961/08/30 DOA: 04/21/2017 PCP: Salli Quarry, PA-C   Brief Narrative:   56 y.o. female with medical history significant for COPD, hypertension, and depression who presents to the emergency department for evaluation of progressive dyspnea, cough, and wheeze. Patient reports that she had been in her usual state until 10-14 days ago when she noted the insidious development of worsening in her chronic dyspnea and increase in her chronic cough  Assessment & Plan:   Principal Problem:   COPD with acute exacerbation (HCC) -Continue current medical regimen. Will continue increase steroid regimen given continued wheezes - DuoNeb nebs  Generalized anxiety disorder - I suspect patient has is a scissors continued complaints about anxiety from patient and nursing. As such I will consult psychiatry for further recommendations. They do not feel patient is a good candidate for benzodiazepine given her COPD. As such further adjustments were made through her medication regimen  Active Problems:   Essential (primary) hypertension - restart patient on ARB    Acute respiratory failure with hypoxia (HCC) -  Continue supplemental oxygen as needed.    Hypokalemia - resolved    Leukocytosis   Prolonged QT interval - resolved based on last EKG  DVT prophylaxis: Lovenox Code Status: Full Family Communication: Discussed with patient and mother at bedside Disposition Plan: Pending improvement in respiratory condition   Consultants:   None   Procedures: None   Antimicrobials: azithromycin   Subjective: Pt has no new complaints. No acute issues overnight.  Objective: Vitals:   04/24/17 1245 04/24/17 1300 04/24/17 1352 04/24/17 1548  BP:  (!) 149/94  (!) 151/106  Pulse:  92 95 (!) 107  Resp:  (!) 30 (!) 22 (!) 22  Temp:  97.8 F (36.6 C)  98.6 F (37 C)  TempSrc: Oral Oral  Oral  SpO2:  95% 94% 96%  Weight:      Height:         Intake/Output Summary (Last 24 hours) at 04/24/17 1616 Last data filed at 04/24/17 0900  Gross per 24 hour  Intake              610 ml  Output              300 ml  Net              310 ml   Filed Weights   04/23/17 0357 04/23/17 1535 04/24/17 0500  Weight: 92.9 kg (204 lb 12.8 oz) 99.5 kg (219 lb 6.4 oz) 97.6 kg (215 lb 3.2 oz)    Examination:  General exam: Appears calm and comfortable, in nad. Respiratory system: equal chest rise, expiratory wheezes, prolonged expiratory phase Cardiovascular system: S1 & S2 heard, RRR.  Gastrointestinal system: Abdomen is nondistended, soft and nontender. No organomegaly or masses felt. Normal bowel sounds heard. Central nervous system: Alert and oriented. No focal neurological deficits. Extremities: Symmetric 5 x 5 power. Skin: No rashes, lesions or ulcers, on limited exam Psychiatry:  Mood & affect appropriate.   Data Reviewed: I have personally reviewed following labs and imaging studies  CBC:  Recent Labs Lab 04/18/17 2115 04/21/17 2332 04/23/17 1308  WBC 9.5 15.5* 19.0*  NEUTROABS 7.3  --   --   HGB 13.8 14.8 14.1  HCT 40.5 45.9 43.1  MCV 88.6 91.4 90.9  PLT 309 435* 393   Basic Metabolic Panel:  Recent Labs Lab 04/18/17 2115 04/21/17 2332 04/22/17 0411 04/23/17 1308  NA 138  142 139 138  K 2.9* 3.0* 3.1* 4.1  CL 104 107 104 104  CO2 25 23 21* 25  GLUCOSE 133* 146* 291* 145*  BUN 6 11 10 14   CREATININE 0.91 1.04* 1.22* 1.10*  CALCIUM 8.5* 9.2 8.6* 9.2   GFR: Estimated Creatinine Clearance: 70.1 mL/min (A) (by C-G formula based on SCr of 1.1 mg/dL (H)). Liver Function Tests:  Recent Labs Lab 04/21/17 2332  AST 38  ALT 32  ALKPHOS 71  BILITOT 0.3  PROT 6.6  ALBUMIN 3.9   No results for input(s): LIPASE, AMYLASE in the last 168 hours. No results for input(s): AMMONIA in the last 168 hours. Coagulation Profile: No results for input(s): INR, PROTIME in the last 168 hours. Cardiac Enzymes:  Recent  Labs Lab 04/21/17 2332 04/22/17 0836  TROPONINI <0.03 <0.03   BNP (last 3 results) No results for input(s): PROBNP in the last 8760 hours. HbA1C: No results for input(s): HGBA1C in the last 72 hours. CBG:  Recent Labs Lab 04/22/17 0753 04/23/17 0741 04/24/17 0838  GLUCAP 244* 169* 182*   Lipid Profile: No results for input(s): CHOL, HDL, LDLCALC, TRIG, CHOLHDL, LDLDIRECT in the last 72 hours. Thyroid Function Tests:  Recent Labs  04/22/17 0411  TSH 0.306*   Anemia Panel: No results for input(s): VITAMINB12, FOLATE, FERRITIN, TIBC, IRON, RETICCTPCT in the last 72 hours. Sepsis Labs: No results for input(s): PROCALCITON, LATICACIDVEN in the last 168 hours.  Recent Results (from the past 240 hour(s))  MRSA PCR Screening     Status: None   Collection Time: 04/22/17  1:52 AM  Result Value Ref Range Status   MRSA by PCR NEGATIVE NEGATIVE Final    Comment:        The GeneXpert MRSA Assay (FDA approved for NASAL specimens only), is one component of a comprehensive MRSA colonization surveillance program. It is not intended to diagnose MRSA infection nor to guide or monitor treatment for MRSA infections.          Radiology Studies: Dg Chest Port 1 View  Result Date: 04/24/2017 CLINICAL DATA:  Acute respiratory distress, dyspnea and chest pain. EXAM: PORTABLE CHEST 1 VIEW COMPARISON:  04/22/2017 FINDINGS: The patient is slightly rotated to the right. ACDF is partially imaged along the lower cervical spine. The heart size and mediastinal contours are within normal limits. Both lungs are clear. There is atelectasis at the left lung base. No overt pulmonary edema, pneumothorax nor effusion. The visualized skeletal structures are nonacute. IMPRESSION: No active disease.  Left basilar atelectasis. Electronically Signed   By: Tollie Eth M.D.   On: 04/24/2017 00:12   Dg Chest Port 1 View  Result Date: 04/22/2017 CLINICAL DATA:  Initial evaluation for acute aspiration.  EXAM: PORTABLE CHEST 1 VIEW COMPARISON:  Prior radiograph from 04/22/2017. FINDINGS: Cardiac and mediastinal silhouettes are stable in size and contour, and remain within normal limits. Lungs mildly hypoinflated. Changes related COPD noted. No focal infiltrates to suggest acute infectious or aspiration pneumonitis. No pulmonary edema or pleural effusion. No pneumothorax. No acute osseus abnormality.  Cervical ACDF noted. IMPRESSION: 1. No active cardiopulmonary disease. No radiographic evidence for active for a shin or infectious pneumonitis. 2. COPD. Electronically Signed   By: Rise Mu M.D.   On: 04/22/2017 22:37    Scheduled Meds: . baclofen  5 mg Oral TID  . buPROPion  150 mg Oral Daily  . cholecalciferol  2,000 Units Oral Daily  . dextromethorphan-guaiFENesin  1 tablet Oral BID  .  enoxaparin (LOVENOX) injection  40 mg Subcutaneous Q24H  . fluticasone  1 spray Each Nare Daily  . Fluticasone-Umeclidin-Vilant  1 puff Inhalation QHS  . gabapentin  600 mg Oral TID  . ipratropium-albuterol  3 mL Nebulization Q6H  . loratadine  10 mg Oral Daily  . losartan  100 mg Oral Daily  . methylPREDNISolone (SOLU-MEDROL) injection  80 mg Intravenous Q6H  . morphine  30 mg Oral Q12H  . nicotine  21 mg Transdermal Daily  . sodium chloride flush  3 mL Intravenous Q12H  . sodium chloride flush  3 mL Intravenous Q12H  . vitamin B-12  1,000 mcg Oral Daily   Continuous Infusions: . sodium chloride    . doxycycline (VIBRAMYCIN) IV Stopped (04/24/17 1511)     LOS: 2 days    Time spent: > 35 minutes   Penny PiaVEGA, Silver Parkey, MD [facility] Pager [phone]  If 7PM-7AM, please contact night-coverage [email or web address] Password [email or web address] 04/24/2017, 4:16 PM

## 2017-04-24 NOTE — Consult Note (Signed)
[facility] Consult   Reason for Consult:  Depression, Anxiety and COPD Referring Physician:  Dr. Wendee Beavers Patient Identification: Deanna Yang MRN:  [id number] Principal Diagnosis: COPD with acute exacerbation [facility]) Diagnosis:   Patient Active Problem List   Diagnosis Date Noted  . Acute respiratory failure with hypoxia ([place]) [J96.01] 04/22/2017  . Hypokalemia [E87.6] 04/22/2017  . Leukocytosis [D72.829] 04/22/2017  . Prolonged QT interval [R94.31] 04/22/2017  . CFIDS (chronic fatigue and immune dysfunction syndrome) (HCC) [R53.82, D89.89] 08/20/2015  . Foot pain [M79.673] 08/20/2015  . Essential (primary) hypertension [I10] 08/20/2015  . COPD with acute exacerbation ([place]) [J44.1] 08/20/2015  . Obstructive apnea [G47.33] 08/20/2015  . Cervical osteoarthritis [M47.812] 08/20/2015  . Sleep disorder [G47.9] 08/20/2015  . Other specified postprocedural states [Z98.890] 07/17/2015  . Borderline personality disorder [F60.3] 06/11/2015    Total Time spent with patient: 1 hour  Subjective:   Deanna Yang is a 56 y.o. female patient admitted with Progressive dyspnea, wheezing and cough.  HPI:  Deanna Yang is a 56 y.o. female with medical history significant for COPD, hypertension, and depression who presents to the emergency department for evaluation of progressive dyspnea, cough, and wheeze. Psychiatric consultation requested for medication management of depression anxiety. Patient seen, chart reviewed and case discussed with the patient and patient mother is at bedside who did not much contribute to the history. Patient has been suffering with multiple symptoms of depression, anxiety and psychosocial stresses related to her family members and unable to work since 2004.  Patient was received Valium, Klonopin and Xanax from Dr. Tacy Dura is a primary care physician and his office no longer writing those medications for her. Patient reported her mom has been suffering with the  breast cancer and required multiple surgeries in treatment. Patient daughter who is 79 years old has been involved with car accident and required rest from school and work. Patient has intention to go back to school to get child education and wants to work even though she did not work since 2004.  Patient has extensive tobacco abuse versus dependence and reported she used to smoke 3 packs per day which was reduced to half pack for day because of her having difficulty with breathing. Patient reportedly being wheelchair bound even at home. She required continuous oxygen supplement. Patient is interested in smoking cessation treatment reportedly her last treatment worked for 6 months free from smoking. Patient is currently on Nicotin patch to reduce her craving. She is willing to take Wellbutrin XL and for depression and Neurontin for anxiety. Patient is not a good candidate for benzodiazepine therapy secondary to severe COPD.   Past Psychiatric History: Patient reportedly received inpatient psychiatric hospitalization 8 years ago in [place] and recently last 2 years in [place] for depression and suicidal ideation. Patient reportedly received outpatient psychiatric medication management from behavioral health's outpatient clinic at [place], [place]. Patient reported her previous medications were Wellbutrin, Prozac which were discontinued once she started feeling better.Reported prozac is not helpful.  Risk to Self: Is patient at risk for suicide?: No Risk to Others:   Prior Inpatient Therapy:   Prior Outpatient Therapy:    Past Medical History:  Past Medical History:  Diagnosis Date  . Anxiety   . COPD (chronic obstructive pulmonary disease) ([place])   . Depression   . Fibromyalgia   . Hypertension   . Hypertension   . Manic depression ([place])   . Neuropathy   . OSA (obstructive sleep apnea)     Past Surgical  History:  Procedure Laterality Date  . ABDOMINAL SURGERY    . APPENDECTOMY     . BACK SURGERY    . BREAST REDUCTION SURGERY    . BREAST SURGERY    . CARPAL TUNNEL RELEASE Bilateral   . CESAREAN SECTION    . CHOLECYSTECTOMY    . HERNIA REPAIR    . JOINT REPLACEMENT    . KNEE SURGERY    . TONSILLECTOMY     Family History:  Family History  Problem Relation Age of Onset  . Alcohol abuse Mother   . Alcohol abuse Father   . Depression Sister   . Dementia Neg Hx    Family Psychiatric  History: Noncontributory Social History:  History  Alcohol Use No     History  Drug Use No    Social History   Social History  . Marital status: Legally Separated    Spouse name: N/A  . Number of children: N/A  . Years of education: N/A   Social History Main Topics  . Smoking status: Current Every Day Smoker    Packs/day: 1.00    Types: Cigarettes  . Smokeless tobacco: Never Used  . Alcohol use No  . Drug use: No  . Sexual activity: Not Currently   Other Topics Concern  . None   Social History Narrative  . None   Additional Social History:    Allergies:   Allergies  Allergen Reactions  . Ambien [Zolpidem Tartrate] Shortness Of Breath and Swelling    Tongue swelling, throat swelling. Tongue swelling, throat swelling.  . Beta Adrenergic Blockers Anaphylaxis  . Fish Allergy Rash and Shortness Of Breath  . Iodinated Diagnostic Agents Itching    Benadryl '50MG'$  prophylaxis before and after and patient reports she did fine  . Latex Itching    BLISTERS SKIN  . Metoprolol Other (See Comments)    ANY MEDICATION THAT ENDS IN -OLOL-   The patient is asthmatic.  Marland Kitchen Penicillins Shortness Of Breath, Rash and Other (See Comments)    Has patient had a PCN reaction causing immediate rash, facial/tongue/throat swelling, SOB or lightheadedness with hypotension: Yes Has patient had a PCN reaction causing severe rash involving mucus membranes or skin necrosis: Unknown Has patient had a PCN reaction that required hospitalization No Has patient had a PCN reaction occurring  within the last 10 years: No If all of the above answers are "NO", then may proceed with Cephalosporin use.   . Red Dye Hives and Shortness Of Breath  . Shellfish Allergy Anaphylaxis  . Sulfa Antibiotics Hives and Shortness Of Breath  . Amoxicillin-Pot Clavulanate Nausea And Vomiting  . Aspirin Other (See Comments)    Blisters on tongue  . Dye Fdc Red  [Amaranth (Fd&C Red #2)] Hives    X ray DYE  (BENADRYL 50 MG PROPHYLAXIS AND AFTER AND  SHE DID FINE, PER PATIENT.).  Marland Kitchen Other Hives  . Tape Dermatitis and Rash  . Levofloxacin Rash    headache  . Seroquel [Quetiapine Fumarate] Other (See Comments)    Unknown    Labs:  Results for orders placed or performed during the hospital encounter of 04/21/17 (from the past 48 hour(s))  Glucose, capillary     Status: Abnormal   Collection Time: 04/23/17  7:41 AM  Result Value Ref Range   Glucose-Capillary 169 (H) 65 - 99 mg/dL  CBC     Status: Abnormal   Collection Time: 04/23/17  1:08 PM  Result Value Ref Range   WBC  19.0 (H) 4.0 - 10.5 K/uL   RBC 4.74 3.87 - 5.11 MIL/uL   Hemoglobin 14.1 12.0 - 15.0 g/dL   HCT 43.1 36.0 - 46.0 %   MCV 90.9 78.0 - 100.0 fL   MCH 29.7 26.0 - 34.0 pg   MCHC 32.7 30.0 - 36.0 g/dL   RDW 14.4 11.5 - 15.5 %   Platelets 393 150 - 400 K/uL  Basic metabolic panel     Status: Abnormal   Collection Time: 04/23/17  1:08 PM  Result Value Ref Range   Sodium 138 135 - 145 mmol/L   Potassium 4.1 3.5 - 5.1 mmol/L    Comment: DELTA CHECK NOTED   Chloride 104 101 - 111 mmol/L   CO2 25 22 - 32 mmol/L   Glucose, Bld 145 (H) 65 - 99 mg/dL   BUN 14 6 - 20 mg/dL   Creatinine, Ser 1.10 (H) 0.44 - 1.00 mg/dL   Calcium 9.2 8.9 - 10.3 mg/dL   GFR calc non Af Amer 56 (L) >60 mL/min   GFR calc Af Amer >60 >60 mL/min    Comment: (NOTE) The eGFR has been calculated using the CKD EPI equation. This calculation has not been validated in all clinical situations. eGFR's persistently <60 mL/min signify possible Chronic  Kidney Disease.    Anion gap 9 5 - 15  Glucose, capillary     Status: Abnormal   Collection Time: 04/24/17  8:38 AM  Result Value Ref Range   Glucose-Capillary 182 (H) 65 - 99 mg/dL    Current Facility-Administered Medications  Medication Dose Route Frequency Provider Last Rate Last Dose  . 0.9 %  sodium chloride infusion  250 mL Intravenous PRN Opyd, Ilene Qua, MD      . acetaminophen (TYLENOL) tablet 650 mg  650 mg Oral Q6H PRN Opyd, Ilene Qua, MD       Or  . acetaminophen (TYLENOL) suppository 650 mg  650 mg Rectal Q6H PRN Opyd, Ilene Qua, MD      . baclofen (LIORESAL) tablet 5 mg  5 mg Oral TID Vianne Bulls, MD   5 mg at 04/24/17 6834  . benzonatate (TESSALON) capsule 100 mg  100 mg Oral TID PRN Velvet Bathe, MD      . cholecalciferol (VITAMIN D) tablet 2,000 Units  2,000 Units Oral Daily Opyd, Ilene Qua, MD   2,000 Units at 04/24/17 1962  . dextromethorphan-guaiFENesin (MUCINEX DM) 30-600 MG per 12 hr tablet 1 tablet  1 tablet Oral BID Velvet Bathe, MD   1 tablet at 04/24/17 2297  . doxycycline (VIBRAMYCIN) 100 mg in dextrose 5 % 250 mL IVPB  100 mg Intravenous BID Velvet Bathe, MD      . enoxaparin (LOVENOX) injection 40 mg  40 mg Subcutaneous Q24H Opyd, Ilene Qua, MD   40 mg at 04/24/17 0805  . fluticasone (FLONASE) 50 MCG/ACT nasal spray 1 spray  1 spray Each Nare Daily Opyd, Ilene Qua, MD   1 spray at 04/24/17 0924  . Fluticasone-Umeclidin-Vilant 100-62.5-25 MCG/INH AEPB 1 puff  1 puff Inhalation QHS Opyd, Ilene Qua, MD      . gabapentin (NEURONTIN) capsule 300 mg  300 mg Oral TID Skeet Simmer, RPH   300 mg at 04/24/17 9892  . hydrALAZINE (APRESOLINE) injection 5 mg  5 mg Intravenous Q6H PRN Velvet Bathe, MD      . hydrOXYzine (ATARAX/VISTARIL) tablet 25 mg  25 mg Oral Q6H PRN Velvet Bathe, MD   25 mg at  04/23/17 1544  . ipratropium-albuterol (DUONEB) 0.5-2.5 (3) MG/3ML nebulizer solution 3 mL  3 mL Inhalation Q2H PRN Opyd, Ilene Qua, MD   3 mL at 04/23/17 1919  .  ipratropium-albuterol (DUONEB) 0.5-2.5 (3) MG/3ML nebulizer solution 3 mL  3 mL Nebulization Q6H Opyd, Ilene Qua, MD   3 mL at 04/24/17 0909  . loratadine (CLARITIN) tablet 10 mg  10 mg Oral Daily Opyd, Ilene Qua, MD   10 mg at 04/24/17 7673  . LORazepam (ATIVAN) injection 0.5 mg  0.5 mg Intravenous Q4H PRN Opyd, Ilene Qua, MD   0.5 mg at 04/24/17 0953  . losartan (COZAAR) tablet 100 mg  100 mg Oral Daily Velvet Bathe, MD   100 mg at 04/24/17 4193  . methylPREDNISolone sodium succinate (SOLU-MEDROL) 125 mg/2 mL injection 80 mg  80 mg Intravenous Q6H Velvet Bathe, MD   80 mg at 04/24/17 0921  . morphine (MS CONTIN) 12 hr tablet 30 mg  30 mg Oral Q12H Opyd, Ilene Qua, MD   30 mg at 04/24/17 7902  . nicotine (NICODERM CQ - dosed in mg/24 hours) patch 21 mg  21 mg Transdermal Daily Velvet Bathe, MD   21 mg at 04/24/17 0924  . oxyCODONE-acetaminophen (PERCOCET/ROXICET) 5-325 MG per tablet 1 tablet  1 tablet Oral Q6H PRN Skeet Simmer, RPH   1 tablet at 04/23/17 2259   And  . oxyCODONE (Oxy IR/ROXICODONE) immediate release tablet 5 mg  5 mg Oral Q6H PRN Skeet Simmer, RPH   5 mg at 04/23/17 2259  . polyethylene glycol (MIRALAX / GLYCOLAX) packet 17 g  17 g Oral Daily PRN Opyd, Ilene Qua, MD      . promethazine (PHENERGAN) tablet 25 mg  25 mg Oral Q8H PRN Velvet Bathe, MD   25 mg at 04/23/17 1427  . sodium chloride flush (NS) 0.9 % injection 3 mL  3 mL Intravenous Q12H Opyd, Ilene Qua, MD   3 mL at 04/23/17 2116  . sodium chloride flush (NS) 0.9 % injection 3 mL  3 mL Intravenous Q12H Opyd, Ilene Qua, MD   3 mL at 04/24/17 0927  . sodium chloride flush (NS) 0.9 % injection 3 mL  3 mL Intravenous PRN Opyd, Ilene Qua, MD      . vitamin B-12 (CYANOCOBALAMIN) tablet 1,000 mcg  1,000 mcg Oral Daily Opyd, Ilene Qua, MD   1,000 mcg at 04/24/17 4097    Musculoskeletal: Strength & Muscle Tone: decreased Gait & Station: unable to stand Patient leans: N/A  Psychiatric Specialty Exam: Physical Exam as  per history and physical   ROS shortness of breath, wheezing, required continuous oxygen supplement, craving for tobacco and, depression and anxiety.  No Fever-chills, No Headache, No changes with Vision or hearing, reports vertigo No problems swallowing food or Liquids, No Chest pain, Cough or Shortness of Breath, No Abdominal pain, No Nausea or Vommitting, Bowel movements are regular, No Blood in stool or Urine, No dysuria, No new skin rashes or bruises, No new joints pains-aches,  No new weakness, tingling, numbness in any extremity, No recent weight gain or loss, No polyuria, polydypsia or polyphagia,  A full 10 point Review of Systems was done, except as stated above, all other Review of Systems were negative.  Blood pressure (!) 141/91, pulse 97, temperature 97.8 F (36.6 C), temperature source Oral, resp. rate 17, height _0  (1.702 m), weight 97.6 kg (215 lb 3.2 oz), SpO2 96 %.Body mass index is 33.71 kg/m.  General Appearance:  Guarded  Eye Contact:  Good  Speech:  Clear and Coherent  Volume:  Decreased  Mood:  Anxious and Depressed  Affect:  Appropriate, Depressed and Tearful  Thought Process:  Coherent and Goal Directed  Orientation:  Full (Time, Place, and Person)  Thought Content:  Rumination  Suicidal Thoughts:  No  Homicidal Thoughts:  No  Memory:  Immediate;   Good Recent;   Fair Remote;   Fair  Judgement:  Impaired  Insight:  Fair  Psychomotor Activity:  Decreased  Concentration:  Concentration: Fair and Attention Span: Fair  Recall:  Good  Fund of Knowledge:  Good  Language:  Good  Akathisia:  Negative  Handed:  Right  AIMS (if indicated):     Assets:  Communication Skills Desire for Improvement Financial Resources/Insurance Housing Leisure Time Resilience  ADL's:  Impaired  Cognition:  WNL  Sleep:        Treatment Plan Summary: 56 years old female with COPD, depression and anxiety, reportedly stopped taking her medication management for  depression anxiety once she started feeling better. Patient is seeking restarting her medication management at this time and has no safety concerns.  Maj. depressive disorder, recurrent, severe without psychotic symptoms Generalized anxiety disorder  Medication recommendation: We start Wellbutrin XL 150 mg daily morning which can be increased to 300 mg if tolerated well We increase the gabapentin to 600 mg 3 times daily for controlling her anxiety Patient is not a good candidate for benzodiazepine treatment secondary to COPD Appreciate psychiatric consultation and follow up as clinically required Please contact [phone] or [phone] if needs further assistance  Disposition: Patient will be referred to the outpatient psychiatric medication management when medically stable at [facility] out patient.  No evidence of imminent risk to self or others at present.   Supportive therapy provided about ongoing stressors.  Ambrose Finland, MD 04/24/2017 11:22 AM

## 2017-04-25 DIAGNOSIS — F411 Generalized anxiety disorder: Secondary | ICD-10-CM

## 2017-04-25 DIAGNOSIS — F4310 Post-traumatic stress disorder, unspecified: Secondary | ICD-10-CM

## 2017-04-25 DIAGNOSIS — F319 Bipolar disorder, unspecified: Secondary | ICD-10-CM

## 2017-04-25 DIAGNOSIS — R0902 Hypoxemia: Secondary | ICD-10-CM

## 2017-04-25 DIAGNOSIS — J383 Other diseases of vocal cords: Secondary | ICD-10-CM

## 2017-04-25 MED ORDER — ORAL CARE MOUTH RINSE
15.0000 mL | Freq: Two times a day (BID) | OROMUCOSAL | Status: DC
  Administered 2017-04-25 – 2017-04-28 (×6): 15 mL via OROMUCOSAL

## 2017-04-25 MED ORDER — NICOTINE 7 MG/24HR TD PT24
7.0000 mg | MEDICATED_PATCH | Freq: Every day | TRANSDERMAL | Status: DC
  Administered 2017-04-26 – 2017-04-28 (×3): 7 mg via TRANSDERMAL
  Filled 2017-04-25 (×3): qty 1
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
## 2017-04-25 NOTE — Progress Notes (Signed)
PROGRESS NOTE    Deanna Yang  JXB:[id number] DOB: 08/06/1961 DOA: 04/21/2017 PCP: Salli Quarry, PA-C   Brief Narrative:   56 y.o. female with medical history significant for COPD, hypertension, and depression who presents to the emergency department for evaluation of progressive dyspnea, cough, and wheeze. Patient reports that she had been in her usual state until 10-14 days ago when she noted the insidious development of worsening in her chronic dyspnea and increase in her chronic cough  Assessment & Plan:   Principal Problem:   COPD with acute exacerbation (HCC) - Despite multiple changes to medication regimen and increasing IV steroids patient continues with wheezes and broken sentences - Will consult pulmonologist for further evaluation and recommendations.  Generalized anxiety disorder - I consulted Psychiatry to recommended against benzodiazepine use. Added wellbutrin and gabapentin.  Active Problems:   Essential (primary) hypertension - restart patient on ARB    Acute respiratory failure with hypoxia (HCC) -  Continue supplemental oxygen as needed.    Hypokalemia - resolved    Leukocytosis   Prolonged QT interval - resolved based on last EKG  DVT prophylaxis: Lovenox Code Status: Full Family Communication: Discussed with patient and mother at bedside Disposition Plan: Consulting Pulmonology   Consultants:   Pulmonology   Procedures: None   Antimicrobials: azithromycin   Subjective: Pt still complaining of wheezing. Still has anxiety  Objective: Vitals:   04/25/17 0357 04/25/17 0725 04/25/17 0743 04/25/17 0754  BP: (!) 148/94  (!) 139/93   Pulse: 79  81   Resp: 13  15   Temp: 97.6 F (36.4 C) 98 F (36.7 C)    TempSrc: Oral Oral    SpO2: 98%  95% 94%  Weight:      Height:        Intake/Output Summary (Last 24 hours) at 04/25/17 1025 Last data filed at 04/25/17 0433  Gross per 24 hour  Intake             1190 ml  Output               400 ml  Net              790 ml   Filed Weights   04/23/17 0357 04/23/17 1535 04/24/17 0500  Weight: 92.9 kg (204 lb 12.8 oz) 99.5 kg (219 lb 6.4 oz) 97.6 kg (215 lb 3.2 oz)    Examination:  General exam: Appears calm and comfortable, in nad. Respiratory system: equal chest rise, expiratory wheezes, prolonged expiratory phase Cardiovascular system: S1 & S2 heard, RRR.  Gastrointestinal system: Abdomen is nondistended, soft and nontender. No organomegaly or masses felt. Normal bowel sounds heard. Central nervous system: Alert and oriented. No focal neurological deficits. Extremities: Symmetric 5 x 5 power. Skin: No rashes, lesions or ulcers, on limited exam Psychiatry:  Mood & affect appropriate.   Data Reviewed: I have personally reviewed following labs and imaging studies  CBC:  Recent Labs Lab 04/18/17 2115 04/21/17 2332 04/23/17 1308  WBC 9.5 15.5* 19.0*  NEUTROABS 7.3  --   --   HGB 13.8 14.8 14.1  HCT 40.5 45.9 43.1  MCV 88.6 91.4 90.9  PLT 309 435* 393   Basic Metabolic Panel:  Recent Labs Lab 04/18/17 2115 04/21/17 2332 04/22/17 0411 04/23/17 1308  NA 138 142 139 138  K 2.9* 3.0* 3.1* 4.1  CL 104 107 104 104  CO2 25 23 21* 25  GLUCOSE 133* 146* 291* 145*  BUN 6 11 10  14  CREATININE 0.91 1.04* 1.22* 1.10*  CALCIUM 8.5* 9.2 8.6* 9.2   GFR: Estimated Creatinine Clearance: 70.1 mL/min (A) (by C-G formula based on SCr of 1.1 mg/dL (H)). Liver Function Tests:  Recent Labs Lab 04/21/17 2332  AST 38  ALT 32  ALKPHOS 71  BILITOT 0.3  PROT 6.6  ALBUMIN 3.9   No results for input(s): LIPASE, AMYLASE in the last 168 hours. No results for input(s): AMMONIA in the last 168 hours. Coagulation Profile: No results for input(s): INR, PROTIME in the last 168 hours. Cardiac Enzymes:  Recent Labs Lab 04/21/17 2332 04/22/17 0836  TROPONINI <0.03 <0.03   BNP (last 3 results) No results for input(s): PROBNP in the last 8760 hours. HbA1C: No results for  input(s): HGBA1C in the last 72 hours. CBG:  Recent Labs Lab 04/22/17 0753 04/23/17 0741 04/24/17 0838  GLUCAP 244* 169* 182*   Lipid Profile: No results for input(s): CHOL, HDL, LDLCALC, TRIG, CHOLHDL, LDLDIRECT in the last 72 hours. Thyroid Function Tests: No results for input(s): TSH, T4TOTAL, FREET4, T3FREE, THYROIDAB in the last 72 hours. Anemia Panel: No results for input(s): VITAMINB12, FOLATE, FERRITIN, TIBC, IRON, RETICCTPCT in the last 72 hours. Sepsis Labs: No results for input(s): PROCALCITON, LATICACIDVEN in the last 168 hours.  Recent Results (from the past 240 hour(s))  MRSA PCR Screening     Status: None   Collection Time: 04/22/17  1:52 AM  Result Value Ref Range Status   MRSA by PCR NEGATIVE NEGATIVE Final    Comment:        The GeneXpert MRSA Assay (FDA approved for NASAL specimens only), is one component of a comprehensive MRSA colonization surveillance program. It is not intended to diagnose MRSA infection nor to guide or monitor treatment for MRSA infections.          Radiology Studies: Dg Chest Port 1 View  Result Date: 04/24/2017 CLINICAL DATA:  Acute respiratory distress, dyspnea and chest pain. EXAM: PORTABLE CHEST 1 VIEW COMPARISON:  04/22/2017 FINDINGS: The patient is slightly rotated to the right. ACDF is partially imaged along the lower cervical spine. The heart size and mediastinal contours are within normal limits. Both lungs are clear. There is atelectasis at the left lung base. No overt pulmonary edema, pneumothorax nor effusion. The visualized skeletal structures are nonacute. IMPRESSION: No active disease.  Left basilar atelectasis. Electronically Signed   By: Tollie Eth M.D.   On: 04/24/2017 00:12    Scheduled Meds: . baclofen  5 mg Oral TID  . buPROPion  150 mg Oral Daily  . cholecalciferol  2,000 Units Oral Daily  . dextromethorphan-guaiFENesin  1 tablet Oral BID  . enoxaparin (LOVENOX) injection  40 mg Subcutaneous Q24H  .  fluticasone  1 spray Each Nare Daily  . Fluticasone-Umeclidin-Vilant  1 puff Inhalation QHS  . gabapentin  600 mg Oral TID  . ipratropium-albuterol  3 mL Nebulization Q6H  . loratadine  10 mg Oral Daily  . losartan  100 mg Oral Daily  . mouth rinse  15 mL Mouth Rinse BID  . methylPREDNISolone (SOLU-MEDROL) injection  80 mg Intravenous Q6H  . morphine  30 mg Oral Q12H  . nicotine  21 mg Transdermal Daily  . sodium chloride flush  3 mL Intravenous Q12H  . sodium chloride flush  3 mL Intravenous Q12H  . vitamin B-12  1,000 mcg Oral Daily   Continuous Infusions: . sodium chloride    . doxycycline (VIBRAMYCIN) IV 100 mg (04/25/17 1010)  LOS: 3 days    Time spent: > 35 minutes   Penny PiaVEGA, Octavie Westerhold, MD [facility] Pager [phone]  If 7PM-7AM, please contact night-coverage [email or web address] Password [email or web address] 04/25/2017, 10:25 AM

## 2017-04-25 NOTE — NC FL2 (Signed)
[place] MEDICAID FL2 LEVEL OF CARE SCREENING TOOL     IDENTIFICATION  Patient Name: Deanna Yang Birthdate: 1961/11/22 Sex: female Admission Date (Current Location): 04/21/2017  [place]   Fac[facility] and Address[facility]  [facility], [address], [place], [place] [address]      Provider Number: [id number]  Attending Physician Name and Address:  Penny PiaVega, [place], MD  Relative Name and Phone Number:       Current Level of Care: Hospital Recommended Level of Care: Skilled Nursing Facility Prior Approval Number:    Date Approved/Denied:   PASRR Number:    Discharge Plan: SNF    Current Diagnoses: Patient Active Problem List   Diagnosis Date Noted  . Vocal cord dysfunction 04/25/2017  . PTSD (post-traumatic stress disorder) 04/25/2017  . 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 Acute respiratory failure with hypoxia (HCC) 04/22/2017  . Hypokalemia 04/22/2017  . Leukocytosis 04/22/2017  . Prolonged QT interval 04/22/2017  . CFIDS (chronic fatigue and immune dysfunction syndrome) (HCC) 08/20/2015  . Foot pain 08/20/2015  . Essential (primary) hypertension 08/20/2015  . COPD with acute exacerbation (HCC) 08/20/2015  . Obstructive apnea 08/20/2015  . Cervical osteoarthritis 08/20/2015  . Sleep disorder 08/20/2015  . Other specified postprocedural states 07/17/2015  . Borderline personality disorder 06/11/2015    Orientation RESPIRATION BLADDER Height & Weight     Self, Time, Situation, Place  O2, Other (Comment) (CPAP at night, on HFNC) Continent Weight: 215 lb 3.2 oz (97.6 kg) Height:  5\' 7"  (170.2 cm)  BEHAVIORAL SYMPTOMS/MOOD NEUROLOGICAL BOWEL NUTRITION STATUS      Continent Diet (regular)  AMBULATORY STATUS COMMUNICATION OF NEEDS Skin   Extensive Assist Verbally Normal                       Personal Care Assistance Level of Assistance  Bathing, Dressing Bathing Assistance: Maximum assistance    Dressing Assistance: Maximum assistance     Functional Limitations Info             SPECIAL CARE FACTORS FREQUENCY  PT (By licensed PT), OT (By licensed OT)     PT Frequency: 5/wk OT Frequency: 5/wk            Contractures      Additional Factors Info  Code Status, Allergies Code Status Info: FULL Allergies Info: Ambien Zolpidem Tartrate, Beta Adrenergic Blockers, Fish Allergy, Iodinated Diagnostic Agents, Latex, Metoprolol, Penicillins, Red Dye, Shellfish Allergy, Sulfa Antibiotics, Amoxicillin-pot Clavulanate, Aspirin, Dye Fdc Red  Amaranth (Fd&c Red #2), Other, Tape, Levofloxacin, Seroquel Quetiapine Fumarate           Current Medications (04/25/2017):  This is the current hospital active medication list Current Facility-Administered Medications  Medication Dose Route Frequency Provider Last Rate Last Dose  . 0.9 %  sodium chloride infusion  250 mL Intravenous PRN Opyd, Lavone Neriimothy S, MD      . acetaminophen (TYLENOL) tablet 650 mg  650 mg Oral Q6H PRN Opyd, Lavone Neriimothy S, MD   650 mg at 04/25/17 0741   Or  . acetaminophen (TYLENOL) suppository 650 mg  650 mg Rectal Q6H PRN Opyd, Lavone Neriimothy S, MD      . baclofen (LIORESAL) tablet 5 mg  5 mg Oral TID Briscoe Deutscherpyd, Timothy S, MD   5 mg at 04/25/17 0943  . benzonatate (TESSALON) capsule 100 mg  100 mg Oral TID PRN Penny PiaVega, [place], MD   100 mg at 04/24/17 1739  .  buPROPion (WELLBUTRIN XL) 24 hr tablet 150 mg  150 mg Oral Daily Leata Mouse, MD   150 mg at 04/25/17 0943  . cholecalciferol (VITAMIN D) tablet 2,000 Units  2,000 Units Oral Daily Opyd, Lavone Neri, MD   2,000 Units at 04/25/17 [phone]  . dextromethorphan-guaiFENesin (MUCINEX DM) 30-600 MG per 12 hr tablet 1 tablet  1 tablet Oral BID Penny Pia, MD   1 tablet at 04/25/17 0943  . doxycycline (VIBRAMYCIN) 100 mg in dextrose 5 % 250 mL IVPB  100 mg Intravenous BID Penny Pia, MD   Stopped at 04/25/17 1210  . enoxaparin (LOVENOX) injection 40 mg  40 mg Subcutaneous Q24H  Opyd, Lavone Neri, MD   40 mg at 04/25/17 0727  . fluticasone (FLONASE) 50 MCG/ACT nasal spray 1 spray  1 spray Each Nare Daily Opyd, Lavone Neri, MD   1 spray at 04/25/17 1011  . Fluticasone-Umeclidin-Vilant 100-62.5-25 MCG/INH AEPB 1 puff  1 puff Inhalation QHS Opyd, Lavone Neri, MD      . gabapentin (NEURONTIN) capsule 600 mg  600 mg Oral TID Leata Mouse, MD   600 mg at 04/25/17 0943  . hydrALAZINE (APRESOLINE) injection 5 mg  5 mg Intravenous Q6H PRN Penny Pia, MD      . hydrOXYzine (ATARAX/VISTARIL) tablet 25 mg  25 mg Oral Q6H PRN Penny Pia, MD   25 mg at 04/24/17 2256  . ipratropium-albuterol (DUONEB) 0.5-2.5 (3) MG/3ML nebulizer solution 3 mL  3 mL Inhalation Q2H PRN Opyd, Lavone Neri, MD   3 mL at 04/23/17 1919  . ipratropium-albuterol (DUONEB) 0.5-2.5 (3) MG/3ML nebulizer solution 3 mL  3 mL Nebulization Q6H Opyd, Lavone Neri, MD   3 mL at 04/25/17 1350  . loratadine (CLARITIN) tablet 10 mg  10 mg Oral Daily Opyd, Lavone Neri, MD   10 mg at 04/25/17 0942  . LORazepam (ATIVAN) injection 0.5 mg  0.5 mg Intravenous Q4H PRN Opyd, Lavone Neri, MD   0.5 mg at 04/25/17 1010  . losartan (COZAAR) tablet 100 mg  100 mg Oral Daily Penny Pia, MD   100 mg at 04/25/17 0942  . MEDLINE mouth rinse  15 mL Mouth Rinse BID Penny Pia, MD   15 mL at 04/25/17 1011  . methylPREDNISolone sodium succinate (SOLU-MEDROL) 125 mg/2 mL injection 80 mg  80 mg Intravenous Q6H Penny Pia, MD   80 mg at 04/25/17 0943  . morphine (MS CONTIN) 12 hr tablet 30 mg  30 mg Oral Q12H Opyd, Lavone Neri, MD   30 mg at 04/25/17 0943  . [START ON 04/26/2017] nicotine (NICODERM CQ - dosed in mg/24 hr) patch 7 mg  7 mg Transdermal Daily Minor, Vilinda Blanks, NP      . oxyCODONE-acetaminophen (PERCOCET/ROXICET) 5-325 MG per tablet 1 tablet  1 tablet Oral Q6H PRN Scarlett Presto, RPH   1 tablet at 04/25/17 0532   And  . oxyCODONE (Oxy IR/ROXICODONE) immediate release tablet 5 mg  5 mg Oral Q6H PRN Scarlett Presto, RPH   5 mg at  04/25/17 0532  . polyethylene glycol (MIRALAX / GLYCOLAX) packet 17 g  17 g Oral Daily PRN Opyd, Lavone Neri, MD   17 g at 04/25/17 0741  . promethazine (PHENERGAN) tablet 25 mg  25 mg Oral Q8H PRN Penny Pia, MD   25 mg at 04/23/17 1427  . sodium chloride flush (NS) 0.9 % injection 3 mL  3 mL Intravenous Q12H Opyd, Lavone Neri, MD   3 mL at 04/23/17  2116  . sodium chloride flush (NS) 0.9 % injection 3 mL  3 mL Intravenous Q12H Opyd, Lavone Neri, MD   3 mL at 04/25/17 0944  . sodium chloride flush (NS) 0.9 % injection 3 mL  3 mL Intravenous PRN Opyd, Lavone Neri, MD      . vitamin B-12 (CYANOCOBALAMIN) tablet 1,000 mcg  1,000 mcg Oral Daily Opyd, Lavone Neri, MD   1,000 mcg at 04/25/17 1610     Discharge Medications: Please see discharge summary for a list of discharge medications.  Relevant Imaging Results:  Relevant Lab Results:   Additional Information SS#: [id number]  Burna Sis, LCSW

## 2017-04-25 NOTE — Progress Notes (Signed)
RT placed patient on cpap with 4L O2 bled into circuit. RT will monitor as needed.

## 2017-04-25 NOTE — Care Management Note (Signed)
Case Management Note  Patient Details  Name: Deanna Yang MRN: [id number] Date of Birth: [birth year]  Subjective/Objective:   From home with children, who are not there all the time.  Presents with  dopd ex, anxiety d/o, htn, resp failure, hypokalemia,leukocytosis.   Per pt/ot eval rec SNF, CSW following for placement.                 Action/Plan: NCM will follow along with CSW  For dc needs.  Expected Discharge Date:  04/24/17               Expected Discharge Plan:  Skilled Nursing Facility  In-House Referral:  Clinical Social Work  Discharge planning Services  CM Consult  Post Acute Care Choice:    Choice offered to:     DME Arranged:    DME Agency:     HH Arranged:    HH Agency:     Status of Service:  Completed, signed off  If discussed at [employer] Meetings, dates discussed:    Additional Comments:  [name], Beckham Capistran Clinton, RN 04/25/2017, 4:08 PM

## 2017-04-25 NOTE — Progress Notes (Signed)
RT placed patient on CPAP with 4L O2 bled into circuit. Patient is tolerating well at this time. RT will monitor as needed.

## 2017-04-25 NOTE — Progress Notes (Signed)
Patient had inhaler at bedside. This nurse informed patient that the inhaler could not stay at bedside. Inhaler was placed into patient's medication bin.

## 2017-04-26 LAB — BASIC METABOLIC PANEL
Anion gap: 10 (ref 5–15)
BUN: 24 mg/dL — AB (ref 6–20)
CO2: 28 mmol/L (ref 22–32)
CREATININE: 0.93 mg/dL (ref 0.44–1.00)
Calcium: 8.9 mg/dL (ref 8.9–10.3)
Chloride: 102 mmol/L (ref 101–111)
GFR calc Af Amer: >60 mL/min (ref >60)
GFR calc non Af Amer: >60 mL/min (ref >60)
Glucose, Bld: 154 mg/dL — ABNORMAL HIGH (ref 65–99)
Potassium: 4.3 mmol/L (ref 3.5–5.1)
Sodium: 140 mmol/L (ref 135–145)

## 2017-04-26 LAB — GLUCOSE, CAPILLARY: Glucose-Capillary: 137 mg/dL — ABNORMAL HIGH (ref 65–99)

## 2017-04-26 MED ORDER — PROMETHAZINE HCL 25 MG/ML IJ SOLN
12.5000 mg | Freq: Once | INTRAMUSCULAR | Status: AC
  Administered 2017-04-26: 12.5 mg via INTRAVENOUS
  Filled 2017-04-26: qty 1

## 2017-04-26 MED ORDER — PREDNISONE 20 MG PO TABS
40.0000 mg | ORAL_TABLET | Freq: Every day | ORAL | Status: DC
  Administered 2017-04-27: 40 mg via ORAL
  Filled 2017-04-26 (×2): qty 2

## 2017-04-26 NOTE — Care Management Important Message (Signed)
Important Message  Patient Details  Name: Deanna Yang MRN: [id number] Date of Birth: Feb 10, 1961   Medicare Important Message Given:  Yes    Kyla BalzarineShealy, Oneka Parada Abena 04/26/2017, 12:14 PM

## 2017-04-26 NOTE — Progress Notes (Signed)
RT placed patient on cpap with 4L O2 bled into circuit. Patient tolerating well at this time. RT will monitor as needed. 

## 2017-04-26 NOTE — Progress Notes (Signed)
PROGRESS NOTE    Deanna MansDolores Duchene  GNF:[id number] DOB: 07/21/1961 DOA: 04/21/2017 PCP: Salli QuarryZimmermann, Matt, PA-C   Brief Narrative: Deanna Yang is a 56 y.o. female with a medical history of COPD, hypertension and depression. She presents with significant wheezing thought to be a COPD exacerbation but possibly vocal cord dysfunction.   Assessment & Plan:   Principal Problem:   COPD with acute exacerbation (HCC) Active Problems:   Essential (primary) hypertension   Acute respiratory failure with hypoxia (HCC)   Hypokalemia   Leukocytosis   Prolonged QT interval   Vocal cord dysfunction   PTSD (post-traumatic stress disorder)   Manic depressive disorder (HCC)   COPD exacerbation Per pulmonology, this is not her presenting problem, but rather, vocal cord dysfunction. -pulmonoogy recommendations: steroids, mucinex, duoneb, fluticasone-umeclidin-vilant  Vocal cord dysfunction -speech therapy recommendations  GAD -ativan prn  Essential hypertension -continue ARB  Acute respiratory failure with hypoxia Thought to be secondary to presumed COPD exacerbation -wean to room air  Infusion therapy Patient sees a Dr. Suezanne JacquetHenson and receives infusions as an outpatient. Unsure of what exactly. Will need to discuss with outpatient physician.   DVT prophylaxis: Lovenox Code Status: Full code Family Communication: Daughter in law at bedside Disposition Plan: Discharge pending respiratory improvement   Consultants:   Pulmonology  Procedures:   None  Antimicrobials:  Azithromycin  Doxycycline    Subjective: Patient reports dyspnea today. No chest pain.  Objective: Vitals:   04/26/17 0525 04/26/17 0640 04/26/17 0758 04/26/17 0806  BP: (!) 141/97  (!) 162/101   Pulse:   98   Resp:   19   Temp:   98.9 F (37.2 C)   TempSrc:   Oral   SpO2:  94% 91% 92%  Weight:      Height:        Intake/Output Summary (Last 24 hours) at 04/26/17 0901 Last data filed at 04/26/17  0350  Gross per 24 hour  Intake              500 ml  Output             1100 ml  Net             -600 ml   Filed Weights   04/23/17 0357 04/23/17 1535 04/24/17 0500  Weight: 92.9 kg (204 lb 12.8 oz) 99.5 kg (219 lb 6.4 oz) 97.6 kg (215 lb 3.2 oz)    Examination:  General exam: Appears calm and comfortable Respiratory system: Diminished breath sounds bilaterally with scattered wheezing and normal respiratory effort.  Cardiovascular system: S1 & S2 heard, RRR. No murmurs, rubs, gallops or clicks. Gastrointestinal system: Abdomen is nondistended, soft and nontender. No organomegaly or masses felt. Normal bowel sounds heard. Central nervous system: Drowsy and oriented to person and time. [facility]. No focal neurological deficits. Extremities: No edema. No calf tenderness Skin: No cyanosis. No rashes Psychiatry: Judgement and insight appear normal. Mood & affect depressed and flat.     Data Reviewed: I have personally reviewed following labs and imaging studies  CBC:  Recent Labs Lab 04/21/17 2332 04/23/17 1308  WBC 15.5* 19.0*  HGB 14.8 14.1  HCT 45.9 43.1  MCV 91.4 90.9  PLT 435* 393   Basic Metabolic Panel:  Recent Labs Lab 04/21/17 2332 04/22/17 0411 04/23/17 1308  NA 142 139 138  K 3.0* 3.1* 4.1  CL 107 104 104  CO2 23 21* 25  GLUCOSE 146* 291* 145*  BUN 11 10  14  CREATININE 1.04* 1.22* 1.10*  CALCIUM 9.2 8.6* 9.2   GFR: Estimated Creatinine Clearance: 70.1 mL/min (A) (by C-G formula based on SCr of 1.1 mg/dL (H)). Liver Function Tests:  Recent Labs Lab 04/21/17 2332  AST 38  ALT 32  ALKPHOS 71  BILITOT 0.3  PROT 6.6  ALBUMIN 3.9   No results for input(s): LIPASE, AMYLASE in the last 168 hours. No results for input(s): AMMONIA in the last 168 hours. Coagulation Profile: No results for input(s): INR, PROTIME in the last 168 hours. Cardiac Enzymes:  Recent Labs Lab 04/21/17 2332 04/22/17 0836  TROPONINI <0.03 <0.03   BNP  (last 3 results) No results for input(s): PROBNP in the last 8760 hours. HbA1C: No results for input(s): HGBA1C in the last 72 hours. CBG:  Recent Labs Lab 04/22/17 0753 04/23/17 0741 04/24/17 0838 04/26/17 0755  GLUCAP 244* 169* 182* 137*   Lipid Profile: No results for input(s): CHOL, HDL, LDLCALC, TRIG, CHOLHDL, LDLDIRECT in the last 72 hours. Thyroid Function Tests: No results for input(s): TSH, T4TOTAL, FREET4, T3FREE, THYROIDAB in the last 72 hours. Anemia Panel: No results for input(s): VITAMINB12, FOLATE, FERRITIN, TIBC, IRON, RETICCTPCT in the last 72 hours. Sepsis Labs: No results for input(s): PROCALCITON, LATICACIDVEN in the last 168 hours.  Recent Results (from the past 240 hour(s))  MRSA PCR Screening     Status: None   Collection Time: 04/22/17  1:52 AM  Result Value Ref Range Status   MRSA by PCR NEGATIVE NEGATIVE Final    Comment:        The GeneXpert MRSA Assay (FDA approved for NASAL specimens only), is one component of a comprehensive MRSA colonization surveillance program. It is not intended to diagnose MRSA infection nor to guide or monitor treatment for MRSA infections.          Radiology Studies: No results found.      Scheduled Meds: . baclofen  5 mg Oral TID  . buPROPion  150 mg Oral Daily  . cholecalciferol  2,000 Units Oral Daily  . dextromethorphan-guaiFENesin  1 tablet Oral BID  . enoxaparin (LOVENOX) injection  40 mg Subcutaneous Q24H  . fluticasone  1 spray Each Nare Daily  . Fluticasone-Umeclidin-Vilant  1 puff Inhalation QHS  . gabapentin  600 mg Oral TID  . ipratropium-albuterol  3 mL Nebulization Q6H  . loratadine  10 mg Oral Daily  . losartan  100 mg Oral Daily  . mouth rinse  15 mL Mouth Rinse BID  . morphine  30 mg Oral Q12H  . nicotine  7 mg Transdermal Daily  . [START ON 04/27/2017] predniSONE  40 mg Oral Q breakfast  . sodium chloride flush  3 mL Intravenous Q12H  . sodium chloride flush  3 mL Intravenous  Q12H  . vitamin B-12  1,000 mcg Oral Daily   Continuous Infusions: . sodium chloride    . doxycycline (VIBRAMYCIN) IV Stopped (04/25/17 2253)     LOS: 4 days     Jacquelin Hawking, MD [facility] 04/26/2017, 9:01 AM Pager: [phone]  If 7PM-7AM, please contact night-coverage [email or web address] Password [email or web address] 04/26/2017, 9:01 AM

## 2017-04-26 NOTE — Progress Notes (Signed)
CSW provided pt with list of SNF offers- [facility]  Patient will be on CPAP at night- does not have one at home- [facility]  CSW will continue to follow  Burna SisJenna H. Avraj Lindroth, LCSW Clinical Social Worker [phone]

## 2017-04-26 NOTE — Progress Notes (Signed)
Patient on auto titrate range of 6-15 on CPAP.

## 2017-04-26 NOTE — Progress Notes (Signed)
Physical Therapy Treatment Patient Details Name: Deanna Yang MRN: [id number] DOB: 1961/08/12 Today's Date: 04/26/2017    History of Present Illness Patient is a 56 yo female who presents with dypsnea, wheeze and cough. CXR- no acute findings but consistent with COPD.  EKG features a sinus tachycardia with rate 146, diffuse nonspecific repolarization abnormality, and QTc interval of 593 ms. PMH includes COPD, hypertension, and depression.    PT Comments    Patient tolerated short distance gait this session on 8L O2 via [place]. Pt required min A and max vc for pursed lip breathing. Limited grossly by anxiety and with DOE. Pt willing to participate in therapy and eager to be more independent. Continue to progress as tolerated with anticipated d/c to SNF for further skilled PT services.      Follow Up Recommendations  SNF     Equipment Recommendations  None recommended by PT    Recommendations for Other Services       Precautions / Restrictions Precautions Precaution Comments: watch 02    Mobility  Bed Mobility Overal bed mobility: Modified Independent             General bed mobility comments: increased time and effort  Transfers Overall transfer level: Needs assistance Equipment used: Rolling walker (2 wheeled) Transfers: Sit to/from Stand Sit to Stand: Min assist         General transfer comment: assist to steady upon standing  Ambulation/Gait Ambulation/Gait assistance: Min assist Ambulation Distance (Feet): 20 Feet Assistive device: Rolling walker (2 wheeled) Gait Pattern/deviations: Step-through pattern;Decreased stride length;Trunk flexed     General Gait Details: max cues for pursed lip breathing, posture, keeping eyes open, and safe use of AD; standing rest breaks due to DOE and anxiety   Stairs            Wheelchair Mobility    Modified Rankin (Stroke Patients Only)       Balance Overall balance assessment: Needs assistance   Sitting  balance-Leahy Scale: Good       Standing balance-Leahy Scale: Fair                              Cognition Arousal/Alertness: Awake/alert Behavior During Therapy: WFL for tasks assessed/performed Overall Cognitive Status: Within Functional Limits for tasks assessed                                        Exercises      General Comments General comments (skin integrity, edema, etc.): pt on 6L O2 beginning/end of session and 8L O2 while ambulating      Pertinent Vitals/Pain Pain Assessment: Faces Faces Pain Scale: Hurts little more Pain Location: generalized Pain Descriptors / Indicators: Aching Pain Intervention(s): Limited activity within patient's tolerance;Monitored during session;Repositioned    Home Living                      Prior Function            PT Goals (current goals can now be found in the care plan section) Progress towards PT goals: Progressing toward goals    Frequency    Min 3X/week      PT Plan Discharge plan needs to be updated    Co-evaluation              AM-PAC PT "6 Clicks"  Daily Activity  Outcome Measure  Difficulty turning over in bed (including adjusting bedclothes, sheets and blankets)?: None Difficulty moving from lying on back to sitting on the side of the bed? : None Difficulty sitting down on and standing up from a chair with arms (e.g., wheelchair, bedside commode, etc,.)?: Total Help needed moving to and from a bed to chair (including a wheelchair)?: A Little Help needed walking in hospital room?: A Little Help needed climbing 3-5 steps with a railing? : A Lot 6 Click Score: 17    End of Session Equipment Utilized During Treatment: Oxygen;Gait belt Activity Tolerance: Other (comment) (limited by anxiety) Patient left: in chair;with call bell/phone within reach Nurse Communication: Mobility status PT Visit Diagnosis: Other (comment) (dyspnea)     Time: 0981-1914 PT Time  Calculation (min) (ACUTE ONLY): 40 min  Charges:  $Gait Training: 23-37 mins $Therapeutic Activity: 8-22 mins                    G Codes:       Erline Levine, PTA Pager: [phone]     Carolynne Edouard 04/26/2017, 4:10 PM

## 2017-04-26 NOTE — Clinical Social Work Note (Addendum)
Clinical Social Work Assessment  Patient Details  Name: Deanna Yang MRN: [id number] Date of Birth: [birth year]  Date of referral:  04/26/17               Reason for consult:  Facility Placement                Permission sought to share information with:  Chartered certified accountant granted to share information::  Yes, Verbal Permission Granted  Name::     Engineer, manufacturing::  SNF  Relationship::  mom  Contact Information:     Housing/Transportation Living arrangements for the past 2 months:  Single Family Home Source of Information:  Patient Patient Interpreter Needed:  None Criminal Activity/Legal Involvement Pertinent to Current Situation/Hospitalization:  No - Comment as needed Significant Relationships:  Adult Children, Parents Lives with:  Adult Children Do you feel safe going back to the place where you live?  No Need for family participation in patient care:  No (Coment)  Care giving concerns:  Pt lives at home with adult dtr.  Patient does not feel as if she could manage at home with current level of weakness.   Social Worker assessment / plan:  CSW met with pt and pt mom at bedside to discuss PT recommendation for SNF.  Patient became very tearful when discussing PT recommendation- kept saying "why can't I do anything right".  CSW provided active listening and support.  CSW explained SNF and SNF referral process.  Employment status:  Disabled (Comment on whether or not currently receiving Disability) Insurance information:  Managed Medicare PT Recommendations:  [place] / Referral to community resources:  [place]  Patient/Family's Response to care:  Patient is agreeable to SNF but upset by her inability to move.  Patient/Family's Understanding of and Emotional Response to Diagnosis, Current Treatment, and Prognosis:  Patient seems to be somewhat irrational about her current condition and is very emotional about  the need for continued medical treatment thought she acknowledges she would be unable to manage at home.  Emotional Assessment Appearance:  Appears stated age Attitude/Demeanor/Rapport:  Crying Affect (typically observed):  Depressed Orientation:  Oriented to Self, Oriented to Place, Oriented to  Time, Oriented to Situation Alcohol / Substance use:  Not Applicable Psych involvement (Current and /or in the community):  No (Comment)  Discharge Needs  Concerns to be addressed:  Care Coordination Readmission within the last 30 days:  No Current discharge risk:  Physical Impairment Barriers to Discharge:  Continued Medical Work up   Jorge Ny, LCSW 04/26/2017, 9:02 AM

## 2017-04-27 DIAGNOSIS — R0603 Acute respiratory distress: Secondary | ICD-10-CM

## 2017-04-27 DIAGNOSIS — F441 Dissociative fugue: Secondary | ICD-10-CM

## 2017-04-27 DIAGNOSIS — F4310 Post-traumatic stress disorder, unspecified: Secondary | ICD-10-CM

## 2017-04-27 DIAGNOSIS — F319 Bipolar disorder, unspecified: Secondary | ICD-10-CM

## 2017-04-27 LAB — BLOOD GAS, ARTERIAL
ACID-BASE EXCESS: 3.3 mmol/L — AB (ref 0.0–2.0)
BICARBONATE: 27.3 mmol/L (ref 20.0–28.0)
DRAWN BY: 331471
O2 CONTENT: 4 L/min
O2 SAT: 96.5 %
PATIENT TEMPERATURE: 98.6
PH ART: 7.43 (ref 7.350–7.450)
pCO2 arterial: 41.9 mmHg (ref 32.0–48.0)
pO2, Arterial: 89.7 mmHg (ref 83.0–108.0)

## 2017-04-27 LAB — GLUCOSE, CAPILLARY: GLUCOSE-CAPILLARY: 89 mg/dL (ref 65–99)

## 2017-04-27 MED ORDER — HYDROXYZINE HCL 10 MG PO TABS
10.0000 mg | ORAL_TABLET | Freq: Once | ORAL | Status: AC
  Administered 2017-04-28: 10 mg via ORAL
  Filled 2017-04-27: qty 1

## 2017-04-27 MED ORDER — DOXYCYCLINE HYCLATE 100 MG PO TABS
100.0000 mg | ORAL_TABLET | Freq: Two times a day (BID) | ORAL | Status: DC
  Administered 2017-04-27 – 2017-04-28 (×3): 100 mg via ORAL
  Filled 2017-04-27 (×3): qty 1

## 2017-04-27 MED ORDER — BUPROPION HCL ER (XL) 150 MG PO TB24
300.0000 mg | ORAL_TABLET | Freq: Every day | ORAL | Status: DC
  Administered 2017-04-28: 300 mg via ORAL
  Filled 2017-04-27: qty 2

## 2017-04-27 MED ORDER — METHYLPREDNISOLONE SODIUM SUCC 125 MG IJ SOLR: 60.0000 mg | Freq: Two times a day (BID) | INTRAMUSCULAR | Status: DC

## 2017-04-27 MED ORDER — SALINE SPRAY 0.65 % NA SOLN
1.0000 | NASAL | Status: DC | PRN
  Administered 2017-04-27: 1 via NASAL
  Filled 2017-04-27: qty 44

## 2017-04-27 MED ORDER — DOXYCYCLINE HYCLATE 100 MG IV SOLR
100.0000 mg | Freq: Two times a day (BID) | INTRAVENOUS | Status: DC
  Filled 2017-04-27: qty 100

## 2017-04-27 MED ORDER — PREDNISONE 20 MG PO TABS
40.0000 mg | ORAL_TABLET | Freq: Every day | ORAL | Status: DC
  Administered 2017-04-28: 40 mg via ORAL
  Filled 2017-04-27: qty 2

## 2017-04-27 NOTE — Progress Notes (Signed)
MD paged regarding pt's loss of IV access. IV team attempted x3 with no success.

## 2017-04-27 NOTE — Consult Note (Signed)
[facility] Face-to-Face Psychiatry Consult   Reason for Consult:  Depression, Anxiety and COPD Referring Physician:  Dr. Lonny Prude Patient Identification: Deanna Yang MRN:  [id number] Principal Diagnosis: COPD with acute exacerbation [facility]) Diagnosis:   Patient Active Problem List   Diagnosis Date Noted  . Vocal cord dysfunction [J38.3] 04/25/2017  . PTSD (post-traumatic stress disorder) [F43.10] 04/25/2017  . 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 Acute respiratory failure with hypoxia ([place]) [J96.01] 04/22/2017  . Hypokalemia [E87.6] 04/22/2017  . Leukocytosis [D72.829] 04/22/2017  . Prolonged QT interval [R94.31] 04/22/2017  . CFIDS (chronic fatigue and immune dysfunction syndrome) (HCC) [R53.82, D89.89] 08/20/2015  . Foot pain [M79.673] 08/20/2015  . Essential (primary) hypertension [I10] 08/20/2015  . COPD with acute exacerbation ([place]) [J44.1] 08/20/2015  . Obstructive apnea [G47.33] 08/20/2015  . Cervical osteoarthritis [M47.812] 08/20/2015  . Sleep disorder [G47.9] 08/20/2015  . Other specified postprocedural states [Z98.890] 07/17/2015  . Borderline personality disorder [F60.3] 06/11/2015    Total Time spent with patient: 1 hour  Subjective:   Deanna Yang is a 56 y.o. female patient admitted with Progressive dyspnea, wheezing and cough.  HPI:  Deanna Yang is a 56 y.o. female with medical history significant for COPD, hypertension, and depression who presents to the emergency department for evaluation of progressive dyspnea, cough, and wheeze. Psychiatric consultation requested for medication management of depression anxiety. Patient seen, chart reviewed and case discussed with the patient and patient mother is at bedside who did not much contribute to the history. Patient has been suffering with multiple symptoms of depression, anxiety and psychosocial stresses related to her family members and unable to work since 2004.  Patient was received Valium, Klonopin and  Xanax from Dr. Tacy Dura is a primary care physician and his office no longer writing those medications for her. Patient reported her mom has been suffering with the breast cancer and required multiple surgeries in treatment. Patient daughter who is 62 years old has been involved with car accident and required rest from school and work. Patient has intention to go back to school to get child education and wants to work even though she did not work since 2004.  Patient has extensive tobacco abuse versus dependence and reported she used to smoke 3 packs per day which was reduced to half pack for day because of her having difficulty with breathing. Patient reportedly being wheelchair bound even at home. She required continuous oxygen supplement. Patient is interested in smoking cessation treatment reportedly her last treatment worked for 6 months free from smoking. Patient is currently on Nicotin patch to reduce her craving. She is willing to take Wellbutrin XL and for depression and Neurontin for anxiety. Patient is not a good candidate for benzodiazepine therapy secondary to severe COPD.   Past Psychiatric History: Patient reportedly received inpatient psychiatric hospitalization 8 years ago in [place] and recently last 2 years in [place] for depression and suicidal ideation. Patient reportedly received outpatient psychiatric medication management from behavioral health's outpatient clinic at [place], [place]. Patient reported her previous medications were Wellbutrin, Prozac which were discontinued once she started feeling better.Reported prozac is not helpful.  04/27/2017 Interval history: Patient seen for this face-to-face psychiatric consultation follow-up as requested by Dr. Lonny Prude her ongoing symptoms of depression, anxiety and feeling low self-esteem. Patient also reported she cannot survive without continuous oxygen because lack of adequate oxygen make her panic. Patient reported she is able to  tolerate her medication Wellbutrin and Neurontin without much difficulties or side effects. Patient is  somewhat worried about what medication changes we are going to make for her. Patient was educated her medication Wellbutrin XL was introduced about 4 days ago which helped her somewhat controlling her depression and tearfulness so will increase to 300 mg to have a better control of her depression. Patient is Neurontin will be continued without any changes for controlling her anxiety and also assured that I'm not going to decrease her oxygen supply, which she is worried most about it. Patient also reported making a negative comments regarding her daughter-in-law few days ago but does not remember anything about it.   Risk to Self: Is patient at risk for suicide?: No Risk to Others:   Prior Inpatient Therapy:   Prior Outpatient Therapy:    Past Medical History:  Past Medical History:  Diagnosis Date  . Anxiety   . COPD (chronic obstructive pulmonary disease) ([place])   . Depression   . Fibromyalgia   . Hypertension   . Hypertension   . Manic depression ([place])   . Neuropathy   . OSA (obstructive sleep apnea)     Past Surgical History:  Procedure Laterality Date  . ABDOMINAL SURGERY    . APPENDECTOMY    . BACK SURGERY    . BREAST REDUCTION SURGERY    . BREAST SURGERY    . CARPAL TUNNEL RELEASE Bilateral   . CESAREAN SECTION    . CHOLECYSTECTOMY    . HERNIA REPAIR    . JOINT REPLACEMENT    . KNEE SURGERY    . TONSILLECTOMY     Family History:  Family History  Problem Relation Age of Onset  . Alcohol abuse Mother   . Alcohol abuse Father   . Depression Sister   . Dementia Neg Hx    Family Psychiatric  History: Noncontributory Social History:  History  Alcohol Use No     History  Drug Use No    Social History   Social History  . Marital status: Legally Separated    Spouse name: N/A  . Number of children: N/A  . Years of education: N/A   Social History Main Topics  .  Smoking status: Current Every Day Smoker    Packs/day: 1.00    Types: Cigarettes  . Smokeless tobacco: Never Used  . Alcohol use No  . Drug use: No  . Sexual activity: Not Currently   Other Topics Concern  . None   Social History Narrative  . None   Additional Social History:    Allergies:   Allergies  Allergen Reactions  . Ambien [Zolpidem Tartrate] Shortness Of Breath and Swelling    Tongue swelling, throat swelling. Tongue swelling, throat swelling.  . Beta Adrenergic Blockers Anaphylaxis  . Fish Allergy Rash and Shortness Of Breath  . Iodinated Diagnostic Agents Itching    Benadryl 50MG prophylaxis before and after and patient reports she did fine  . Latex Itching    BLISTERS SKIN  . Metoprolol Other (See Comments)    ANY MEDICATION THAT ENDS IN -OLOL-   The patient is asthmatic.  Marland Kitchen Penicillins Shortness Of Breath, Rash and Other (See Comments)    Has patient had a PCN reaction causing immediate rash, facial/tongue/throat swelling, SOB or lightheadedness with hypotension: Yes Has patient had a PCN reaction causing severe rash involving mucus membranes or skin necrosis: Unknown Has patient had a PCN reaction that required hospitalization No Has patient had a PCN reaction occurring within the last 10 years: No If all of the above answers  are "NO", then may proceed with Cephalosporin use.   . Red Dye Hives and Shortness Of Breath  . Shellfish Allergy Anaphylaxis  . Sulfa Antibiotics Hives and Shortness Of Breath  . Amoxicillin-Pot Clavulanate Nausea And Vomiting  . Aspirin Other (See Comments)    Blisters on tongue  . Dye Fdc Red  [Amaranth (Fd&C Red #2)] Hives    X ray DYE  (BENADRYL 50 MG PROPHYLAXIS AND AFTER AND  SHE DID FINE, PER PATIENT.).  Marland Kitchen Other Hives  . Tape Dermatitis and Rash  . Levofloxacin Rash    headache  . Seroquel [Quetiapine Fumarate] Other (See Comments)    Unknown    Labs:  Results for orders placed or performed during the hospital  encounter of 04/21/17 (from the past 48 hour(s))  Glucose, capillary     Status: Abnormal   Collection Time: 04/26/17  7:55 AM  Result Value Ref Range   Glucose-Capillary 137 (H) 65 - 99 mg/dL  Basic metabolic panel     Status: Abnormal   Collection Time: 04/26/17  9:04 AM  Result Value Ref Range   Sodium 140 135 - 145 mmol/L   Potassium 4.3 3.5 - 5.1 mmol/L   Chloride 102 101 - 111 mmol/L   CO2 28 22 - 32 mmol/L   Glucose, Bld 154 (H) 65 - 99 mg/dL   BUN 24 (H) 6 - 20 mg/dL   Creatinine, Ser 0.93 0.44 - 1.00 mg/dL   Calcium 8.9 8.9 - 10.3 mg/dL   GFR calc non Af Amer >60 >60 mL/min   GFR calc Af Amer >60 >60 mL/min    Comment: (NOTE) The eGFR has been calculated using the CKD EPI equation. This calculation has not been validated in all clinical situations. eGFR's persistently <60 mL/min signify possible Chronic Kidney Disease.    Anion gap 10 5 - 15  Glucose, capillary     Status: None   Collection Time: 04/27/17  8:12 AM  Result Value Ref Range   Glucose-Capillary 89 65 - 99 mg/dL  Blood gas, arterial     Status: Abnormal   Collection Time: 04/27/17 12:56 PM  Result Value Ref Range   O2 Content 4.0 L/min   Delivery systems NASAL CANNULA    pH, Arterial 7.430 7.350 - 7.450   pCO2 arterial 41.9 32.0 - 48.0 mmHg   pO2, Arterial 89.7 83.0 - 108.0 mmHg   Bicarbonate 27.3 20.0 - 28.0 mmol/L   Acid-Base Excess 3.3 (H) 0.0 - 2.0 mmol/L   O2 Saturation 96.5 %   Patient temperature 98.6    Collection site RIGHT RADIAL    Drawn by [id number]    Sample type ARTERIAL DRAW    Allens test (pass/fail) PASS PASS    Current Facility-Administered Medications  Medication Dose Route Frequency Provider Last Rate Last Dose  . 0.9 %  sodium chloride infusion  250 mL Intravenous PRN Opyd, Ilene Qua, MD      . acetaminophen (TYLENOL) tablet 650 mg  650 mg Oral Q6H PRN Opyd, Ilene Qua, MD   650 mg at 04/26/17 1505   Or  . acetaminophen (TYLENOL) suppository 650 mg  650 mg Rectal Q6H PRN Opyd,  Ilene Qua, MD      . baclofen (LIORESAL) tablet 5 mg  5 mg Oral TID Vianne Bulls, MD   5 mg at 04/27/17 0935  . benzonatate (TESSALON) capsule 100 mg  100 mg Oral TID PRN Velvet Bathe, MD   100 mg at 04/24/17 1739  .  buPROPion (WELLBUTRIN XL) 24 hr tablet 150 mg  150 mg Oral Daily Ambrose Finland, MD   150 mg at 04/27/17 0936  . cholecalciferol (VITAMIN D) tablet 2,000 Units  2,000 Units Oral Daily Opyd, Ilene Qua, MD   2,000 Units at 04/27/17 0935  . dextromethorphan-guaiFENesin (MUCINEX DM) 30-600 MG per 12 hr tablet 1 tablet  1 tablet Oral BID Velvet Bathe, MD   1 tablet at 04/27/17 0936  . doxycycline (VIBRA-TABS) tablet 100 mg  100 mg Oral Q12H Mariel Aloe, MD      . enoxaparin (LOVENOX) injection 40 mg  40 mg Subcutaneous Q24H Opyd, Ilene Qua, MD   40 mg at 04/27/17 0900  . fluticasone (FLONASE) 50 MCG/ACT nasal spray 1 spray  1 spray Each Nare Daily Opyd, Ilene Qua, MD   1 spray at 04/27/17 0936  . Fluticasone-Umeclidin-Vilant 100-62.5-25 MCG/INH AEPB 1 puff  1 puff Inhalation QHS Opyd, Ilene Qua, MD      . gabapentin (NEURONTIN) capsule 600 mg  600 mg Oral TID Ambrose Finland, MD   600 mg at 04/27/17 0937  . hydrALAZINE (APRESOLINE) injection 5 mg  5 mg Intravenous Q6H PRN Velvet Bathe, MD   5 mg at 04/27/17 0607  . hydrOXYzine (ATARAX/VISTARIL) tablet 25 mg  25 mg Oral Q6H PRN Velvet Bathe, MD   25 mg at 04/27/17 0644  . ipratropium-albuterol (DUONEB) 0.5-2.5 (3) MG/3ML nebulizer solution 3 mL  3 mL Inhalation Q2H PRN Opyd, Ilene Qua, MD   3 mL at 04/25/17 1701  . ipratropium-albuterol (DUONEB) 0.5-2.5 (3) MG/3ML nebulizer solution 3 mL  3 mL Nebulization Q6H Opyd, Ilene Qua, MD   3 mL at 04/27/17 1446  . loratadine (CLARITIN) tablet 10 mg  10 mg Oral Daily Opyd, Ilene Qua, MD   10 mg at 04/27/17 0937  . LORazepam (ATIVAN) injection 0.5 mg  0.5 mg Intravenous Q4H PRN Opyd, Ilene Qua, MD   0.5 mg at 04/26/17 2204  . losartan (COZAAR) tablet 100 mg  100 mg Oral  Daily Velvet Bathe, MD   100 mg at 04/27/17 0935  . MEDLINE mouth rinse  15 mL Mouth Rinse BID Velvet Bathe, MD   15 mL at 04/27/17 0934  . morphine (MS CONTIN) 12 hr tablet 30 mg  30 mg Oral Q12H Opyd, Ilene Qua, MD   30 mg at 04/27/17 0936  . nicotine (NICODERM CQ - dosed in mg/24 hr) patch 7 mg  7 mg Transdermal Daily Minor, Grace Bushy, NP   7 mg at 04/27/17 3545  . oxyCODONE-acetaminophen (PERCOCET/ROXICET) 5-325 MG per tablet 1 tablet  1 tablet Oral Q6H PRN Skeet Simmer, RPH   1 tablet at 04/25/17 0532   And  . oxyCODONE (Oxy IR/ROXICODONE) immediate release tablet 5 mg  5 mg Oral Q6H PRN Skeet Simmer, RPH   5 mg at 04/25/17 0532  . polyethylene glycol (MIRALAX / GLYCOLAX) packet 17 g  17 g Oral Daily PRN Opyd, Ilene Qua, MD   17 g at 04/25/17 0741  . [START ON 04/28/2017] predniSONE (DELTASONE) tablet 40 mg  40 mg Oral Q breakfast Mariel Aloe, MD      . promethazine (PHENERGAN) tablet 25 mg  25 mg Oral Q8H PRN Velvet Bathe, MD   25 mg at 04/26/17 2057  . sodium chloride flush (NS) 0.9 % injection 3 mL  3 mL Intravenous Q12H Opyd, Ilene Qua, MD   3 mL at 04/27/17 0936  . sodium chloride flush (NS) 0.9 %  injection 3 mL  3 mL Intravenous Q12H Opyd, Ilene Qua, MD   3 mL at 04/26/17 2133  . sodium chloride flush (NS) 0.9 % injection 3 mL  3 mL Intravenous PRN Opyd, Ilene Qua, MD      . vitamin B-12 (CYANOCOBALAMIN) tablet 1,000 mcg  1,000 mcg Oral Daily Opyd, Ilene Qua, MD   1,000 mcg at 04/27/17 0935    Musculoskeletal: Strength & Muscle Tone: decreased Gait & Station: unable to stand Patient leans: N/A  Psychiatric Specialty Exam: Physical Exam as per history and physical   ROS shortness of breath, wheezing, required continuous oxygen supplement, craving for tobacco and, depression and anxiety.  No Fever-chills, No Headache, No changes with Vision or hearing, reports vertigo No problems swallowing food or Liquids, No Chest pain, Cough or Shortness of Breath, No Abdominal  pain, No Nausea or Vommitting, Bowel movements are regular, No Blood in stool or Urine, No dysuria, No new skin rashes or bruises, No new joints pains-aches,  No new weakness, tingling, numbness in any extremity, No recent weight gain or loss, No polyuria, polydypsia or polyphagia,  A full 10 point Review of Systems was done, except as stated above, all other Review of Systems were negative.  Blood pressure 124/81, pulse 85, temperature 98.7 F (37.1 C), temperature source Oral, resp. rate 10, height _0  (1.702 m), weight 98.9 kg (218 lb), SpO2 99 %.Body mass index is 34.14 kg/m.  General Appearance: Guarded  Eye Contact:  Good  Speech:  Clear and Coherent  Volume:  Decreased  Mood:  Anxious and Depressed  Affect:  Appropriate, Depressed and Tearful  Thought Process:  Coherent and Goal Directed  Orientation:  Full (Time, Place, and Person)  Thought Content:  Rumination  Suicidal Thoughts:  No  Homicidal Thoughts:  No  Memory:  Immediate;   Good Recent;   Fair Remote;   Fair  Judgement:  Impaired  Insight:  Fair  Psychomotor Activity:  Decreased  Concentration:  Concentration: Fair and Attention Span: Fair  Recall:  Good  Fund of Knowledge:  Good  Language:  Good  Akathisia:  Negative  Handed:  Right  AIMS (if indicated):     Assets:  Communication Skills Desire for Improvement Financial Resources/Insurance Housing Leisure Time Resilience  ADL's:  Impaired  Cognition:  WNL  Sleep:        Treatment Plan Summary: 56 years old female with COPD, depression and anxiety, reportedly stopped taking  medication for depression anxiety once she started feeling better. Patient is seeking restarting her medication management at this time and has no safety concerns.  Maj. depressive disorder, recurrent, severe without psychotic symptoms Generalized anxiety disorder  Medication recommendation: Increase Wellbutrin XL 3000 mg daily morning starting tomorrow Continue  gabapentin to 600 mg 3 times daily for controlling her anxiety Patient is not a good candidate for benzodiazepine treatment secondary to COPD Appreciate psychiatric consultation and follow up as clinically required Please contact [phone] or [phone] if needs further assistance  Disposition: Patient will be referred to the outpatient psychiatric medication management when medically stable at [facility] out patient.  No evidence of imminent risk to self or others at present.   Supportive therapy provided about ongoing stressors.  Ambrose Finland, MD 04/27/2017 3:33 PM
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
## 2017-04-27 NOTE — Progress Notes (Signed)
PROGRESS NOTE    Deanna Yang  AVW:[id number] DOB: 24-May-1961 DOA: 04/21/2017 PCP: Salli Quarry, PA-C   Brief Narrative: Deanna Yang is a 56 y.o. female with a medical history of COPD, hypertension and depression. She presents with significant wheezing thought to be a COPD exacerbation but possibly vocal cord dysfunction.   Assessment & Plan:   Principal Problem:   COPD with acute exacerbation (HCC) Active Problems:   Essential (primary) hypertension   Acute respiratory failure with hypoxia (HCC)   Hypokalemia   Leukocytosis   Prolonged QT interval   Vocal cord dysfunction   PTSD (post-traumatic stress disorder)   Manic depressive disorder (HCC)   COPD Per pulmonology, this is not her presenting problem, but rather, vocal cord dysfunction. -pulmonoogy recommendations: steroids taper, mucinex, duoneb, fluticasone-umeclidin-vilant  Vocal cord dysfunction -speech therapy recommendations  GAD -ativan prn  Essential hypertension -continue ARB  Acute respiratory failure with hypoxia Thought to be secondary to presumed COPD exacerbation -ABG -wean to room air as tolerated -discussed with pulmonology who recommends continued steroids  IgG deficiency Patient sees a Dr. Steele Berg and receives infusions as an outpatient. She receives IVIG. Follow-up as an outpatient.   DVT prophylaxis: Lovenox Code Status: Full code Family Communication: Daughter in law at bedside Disposition Plan: Discharge pending respiratory improvement   Consultants:   Pulmonology  Procedures:   None  Antimicrobials:  Azithromycin  Doxycycline    Subjective: Patient states she had a panic attack this morning. She feels that her right hand is swollen.  Objective: Vitals:   04/27/17 0607 04/27/17 0814 04/27/17 1000 04/27/17 1200  BP: (!) 161/101  (!) 121/59 124/81  Pulse:   97 85  Resp:   14 10  Temp:  98.5 F (36.9 C)  98.7 F (37.1 C)  TempSrc:  Oral  Oral  SpO2:   98%  99%  Weight:      Height:        Intake/Output Summary (Last 24 hours) at 04/27/17 1417 Last data filed at 04/27/17 0934  Gross per 24 hour  Intake              490 ml  Output              300 ml  Net              190 ml   Filed Weights   04/23/17 1535 04/24/17 0500 04/27/17 0413  Weight: 99.5 kg (219 lb 6.4 oz) 97.6 kg (215 lb 3.2 oz) 98.9 kg (218 lb)    Examination:  General exam: Appears calm and comfortable Respiratory system: Diminished breath sounds bilaterally with scattered wheezing, prolonged expiratory phase and normal respiratory effort.  Cardiovascular system: S1 & S2 heard, RRR. No murmurs, rubs, gallops or clicks. Gastrointestinal system: Abdomen is nondistended, soft and nontender. No organomegaly or masses felt. Normal bowel sounds heard. Central nervous system: Drowsy and oriented to person and time. Thought she was at [facility]. No focal neurological deficits. Extremities: No edema. No calf tenderness. Some right hand swelling (IV infiltrated) Skin: No cyanosis. Erythematous blanching rash without warmth. Some bruises on bilateral arms. Psychiatry: Judgement and insight appear normal. Mood & affect depressed/anxious and flat.     Data Reviewed: I have personally reviewed following labs and imaging studies  CBC:  Recent Labs Lab 04/21/17 2332 04/23/17 1308  WBC 15.5* 19.0*  HGB 14.8 14.1  HCT 45.9 43.1  MCV 91.4 90.9  PLT 435* 393   Basic Metabolic Panel:  Recent Labs  Lab 04/21/17 2332 04/22/17 0411 04/23/17 1308 04/26/17 0904  NA 142 139 138 140  K 3.0* 3.1* 4.1 4.3  CL 107 104 104 102  CO2 23 21* 25 28  GLUCOSE 146* 291* 145* 154*  BUN 11 10 14  24*  CREATININE 1.04* 1.22* 1.10* 0.93  CALCIUM 9.2 8.6* 9.2 8.9   GFR: Estimated Creatinine Clearance: 83.5 mL/min (by C-G formula based on SCr of 0.93 mg/dL). Liver Function Tests:  Recent Labs Lab 04/21/17 2332  AST 38  ALT 32  ALKPHOS 71  BILITOT 0.3  PROT 6.6  ALBUMIN 3.9   No  results for input(s): LIPASE, AMYLASE in the last 168 hours. No results for input(s): AMMONIA in the last 168 hours. Coagulation Profile: No results for input(s): INR, PROTIME in the last 168 hours. Cardiac Enzymes:  Recent Labs Lab 04/21/17 2332 04/22/17 0836  TROPONINI <0.03 <0.03   BNP (last 3 results) No results for input(s): PROBNP in the last 8760 hours. HbA1C: No results for input(s): HGBA1C in the last 72 hours. CBG:  Recent Labs Lab 04/22/17 0753 04/23/17 0741 04/24/17 0838 04/26/17 0755 04/27/17 0812  GLUCAP 244* 169* 182* 137* 89   Lipid Profile: No results for input(s): CHOL, HDL, LDLCALC, TRIG, CHOLHDL, LDLDIRECT in the last 72 hours. Thyroid Function Tests: No results for input(s): TSH, T4TOTAL, FREET4, T3FREE, THYROIDAB in the last 72 hours. Anemia Panel: No results for input(s): VITAMINB12, FOLATE, FERRITIN, TIBC, IRON, RETICCTPCT in the last 72 hours. Sepsis Labs: No results for input(s): PROCALCITON, LATICACIDVEN in the last 168 hours.  Recent Results (from the past 240 hour(s))  MRSA PCR Screening     Status: None   Collection Time: 04/22/17  1:52 AM  Result Value Ref Range Status   MRSA by PCR NEGATIVE NEGATIVE Final    Comment:        The GeneXpert MRSA Assay (FDA approved for NASAL specimens only), is one component of a comprehensive MRSA colonization surveillance program. It is not intended to diagnose MRSA infection nor to guide or monitor treatment for MRSA infections.          Radiology Studies: No results found.      Scheduled Meds: . baclofen  5 mg Oral TID  . buPROPion  150 mg Oral Daily  . cholecalciferol  2,000 Units Oral Daily  . dextromethorphan-guaiFENesin  1 tablet Oral BID  . enoxaparin (LOVENOX) injection  40 mg Subcutaneous Q24H  . fluticasone  1 spray Each Nare Daily  . Fluticasone-Umeclidin-Vilant  1 puff Inhalation QHS  . gabapentin  600 mg Oral TID  . ipratropium-albuterol  3 mL Nebulization Q6H  .  loratadine  10 mg Oral Daily  . losartan  100 mg Oral Daily  . mouth rinse  15 mL Mouth Rinse BID  . morphine  30 mg Oral Q12H  . nicotine  7 mg Transdermal Daily  . predniSONE  40 mg Oral Q breakfast  . sodium chloride flush  3 mL Intravenous Q12H  . sodium chloride flush  3 mL Intravenous Q12H  . vitamin B-12  1,000 mcg Oral Daily   Continuous Infusions: . sodium chloride    . doxycycline (VIBRAMYCIN) IV       LOS: 5 days     Jacquelin Hawkingalph Jniyah Dantuono, MD [facility] 04/27/2017, 2:17 PM Pager: [phone]) [phone]  If 7PM-7AM, please contact night-coverage [email or web address] Password [email or web address] 04/27/2017, 2:17 PM

## 2017-04-28 LAB — CBC
HCT: 44 % (ref 36.0–46.0)
Hemoglobin: 14.1 g/dL (ref 12.0–15.0)
MCH: 29.6 pg (ref 26.0–34.0)
MCHC: 32 g/dL (ref 30.0–36.0)
MCV: 92.4 fL (ref 78.0–100.0)
PLATELETS: 325 10*3/uL (ref 150–400)
RBC: 4.76 MIL/uL (ref 3.87–5.11)
RDW: 14 % (ref 11.5–15.5)
WBC: 13.6 10*3/uL — AB (ref 4.0–10.5)

## 2017-04-28 LAB — GLUCOSE, CAPILLARY: Glucose-Capillary: 92 mg/dL (ref 65–99)

## 2017-04-28 MED ORDER — BENZONATATE 100 MG PO CAPS: 100.0000 mg | ORAL_CAPSULE | Freq: Three times a day (TID) | ORAL | Status: DC | PRN

## 2017-04-28 MED ORDER — MORPHINE SULFATE ER 30 MG PO TBCR: 30.0000 mg | EXTENDED_RELEASE_TABLET | Freq: Two times a day (BID) | ORAL | 0 refills | Status: AC

## 2017-04-28 MED ORDER — BUPROPION HCL ER (XL) 300 MG PO TB24: 300.0000 mg | ORAL_TABLET | Freq: Every day | ORAL | Status: DC

## 2017-04-28 MED ORDER — LORAZEPAM 0.5 MG PO TABS: 0.5000 mg | ORAL_TABLET | Freq: Two times a day (BID) | ORAL | 0 refills | Status: DC | PRN

## 2017-04-28 MED ORDER — IPRATROPIUM-ALBUTEROL 0.5-2.5 (3) MG/3ML IN SOLN
3.0000 mL | Freq: Four times a day (QID) | RESPIRATORY_TRACT | Status: DC
Start: 2017-04-28 — End: 2017-04-28
  Administered 2017-04-28 (×3): 3 mL via RESPIRATORY_TRACT
  Filled 2017-04-28 (×3): qty 3

## 2017-04-28 MED ORDER — PREDNISONE 20 MG PO TABS: ORAL_TABLET | ORAL | Status: DC

## 2017-04-28 MED ORDER — DM-GUAIFENESIN ER 30-600 MG PO TB12: 1.0000 | ORAL_TABLET | Freq: Two times a day (BID) | ORAL | Status: DC

## 2017-04-28 MED ORDER — POLYETHYLENE GLYCOL 3350 17 G PO PACK: 17.0000 g | PACK | Freq: Every day | ORAL | 0 refills | Status: DC

## 2017-04-28 MED ORDER — NICOTINE 7 MG/24HR TD PT24: 7.0000 mg | MEDICATED_PATCH | Freq: Every day | TRANSDERMAL | Status: DC

## 2017-04-28 MED ORDER — HYDROXYZINE HCL 25 MG PO TABS: 25.0000 mg | ORAL_TABLET | Freq: Four times a day (QID) | ORAL | Status: DC | PRN

## 2017-04-28 NOTE — Progress Notes (Signed)
Physical Therapy Treatment Patient Details Name: Deanna Yang MRN: [id number] DOB: 02/02/1961 Today's Date: 04/28/2017    History of Present Illness Patient is a 56 yo female who presents with dypsnea, wheeze and cough. CXR- no acute findings but consistent with COPD.  EKG features a sinus tachycardia with rate 146, diffuse nonspecific repolarization abnormality, and QTc interval of 593 ms. PMH includes COPD, hypertension, and depression.    PT Comments    Patient is progressing well toward mobility goals and anxiety under control this session. Continue to progress as tolerated with anticipated d/c to SNF for further skilled PT services.     Follow Up Recommendations  SNF     Equipment Recommendations  None recommended by PT    Recommendations for Other Services       Precautions / Restrictions Precautions Precaution Comments: watch 02    Mobility  Bed Mobility Overal bed mobility: Independent                Transfers Overall transfer level: Needs assistance Equipment used: Rolling walker (2 wheeled) Transfers: Sit to/from Stand Sit to Stand: Min guard         General transfer comment: min guard for safety; cues for safety and for use of RW upon standing as pt tends to be impulsive and with posterior LOB without UE support  Ambulation/Gait Ambulation/Gait assistance: Supervision Ambulation Distance (Feet): 180 Feet Assistive device: Rolling walker (2 wheeled) Gait Pattern/deviations: Step-through pattern;Decreased stride length;Trunk flexed     General Gait Details: pt with carry over of pursed lip breathing and much less anxiety with mobility this session; 2 standing rest breaks for elevated RR rate   Stairs            Wheelchair Mobility    Modified Rankin (Stroke Patients Only)       Balance Overall balance assessment: Needs assistance   Sitting balance-Leahy Scale: Good       Standing balance-Leahy Scale: Fair                               Cognition Arousal/Alertness: Awake/alert Behavior During Therapy: WFL for tasks assessed/performed Overall Cognitive Status: Within Functional Limits for tasks assessed                                        Exercises      General Comments General comments (skin integrity, edema, etc.): VSS throughout session      Pertinent Vitals/Pain Pain Assessment: Faces Faces Pain Scale: Hurts little more Pain Location: L side neck and L arm Pain Descriptors / Indicators: Sore Pain Intervention(s): Monitored during session    Home Living                      Prior Function            PT Goals (current goals can now be found in the care plan section) Progress towards PT goals: Progressing toward goals    Frequency    Min 3X/week      PT Plan Current plan remains appropriate    Co-evaluation              AM-PAC PT "6 Clicks" Daily Activity  Outcome Measure  Difficulty turning over in bed (including adjusting bedclothes, sheets and blankets)?: None Difficulty moving from lying on back to  sitting on the side of the bed? : None Difficulty sitting down on and standing up from a chair with arms (e.g., wheelchair, bedside commode, etc,.)?: Total Help needed moving to and from a bed to chair (including a wheelchair)?: A Little Help needed walking in hospital room?: A Little Help needed climbing 3-5 steps with a railing? : A Lot 6 Click Score: 17    End of Session Equipment Utilized During Treatment: Oxygen;Gait belt Activity Tolerance: Patient tolerated treatment well Patient left: with call bell/phone within reach;in bed Nurse Communication: Mobility status PT Visit Diagnosis: Other (comment) (dyspnea)     Time: [id number] PT Time Calculation (min) (ACUTE ONLY): 26 min  Charges:  $Gait Training: 23-37 mins                    G Codes:       Deanna Yang, PTA Pager: [phone]) [phone]     Deanna Yang 04/28/2017,  4:12 PM

## 2017-04-28 NOTE — Discharge Summary (Signed)
Physician Discharge Summary  Deanna MansDolores Blakney AOZ:[id number] DOB: 10-10-1961 DOA: 04/21/2017  PCP: Salli QuarryZimmermann, Matt, PA-C  Admit date: 04/21/2017 Discharge date: 04/28/2017  Admitted From: Home Disposition: SNF  Recommendations for Outpatient Follow-up:  1. Follow up with PCP in 1 week 2. Follow up with pulmonologist in 1 week 3. Follow-up with ENT for possible vocal cord dysfunction 4. Recommend home health speech therapy  Home Health: Speech therapy Equipment/Devices: Oxygen, 3L via [place]  Discharge Condition: Stable CODE STATUS: Full code Diet recommendation: Regular diet   Brief/Interim Summary:  Admission HPI written by Briscoe Deutscherimothy S Opyd, MD   Chief Complaint: SOB, wheezing   HPI: Deanna Yang is a 56 y.o. female with medical history significant for COPD, hypertension, and depression who presents to the emergency department for evaluation of progressive dyspnea, cough, and wheeze. Patient reports that she had been in her usual state until 10-14 days ago when she noted the insidious development of worsening in her chronic dyspnea and increase in her chronic cough. Since that time, her condition is continued to worsen and she is coughing up green sputum. She was evaluated twice in the emergency department for this and has seen her pulmonologist in the clinic twice during this illness. She was most recently seen 2 days ago, started on doxycycline and prednisone, but has not had any significant improvement. She had been on Levaquin prior to this and reported that she had seemed to be improving for a few days, before worsening severely over the past 3 days. She denies fevers or chills and denies chest pain or palpitations. She reports occasional lower extremity edema in the past which she attributes to arthritis, but none lately. She denies any prolonged immobilization or tenderness in the lower extremity. She denies personal or family history of VTE. She was increasingly dyspneic while at rest  tonight and called EMS. They found her to be saturating 85% and in acute distress. She was placed on CPAP and treated with 125 mg IV Solu-Medrol, 2 g of magnesium, and 2 neb treatments while en route.  ED Course: Upon arrival to the ED, patient is found to be afebrile, saturating 100% on BiPAP, tachypneic, tachycardic, and mildly hypertensive. EKG features a sinus tachycardia with rate 146, diffuse nonspecific repolarization abnormality, and QTc interval of 593 ms. Chest x-ray is negative for acute cardiopulmonary disease, but features findings consistent with COPD. Chemstrip panel is notable for a potassium of 3.0 and CBC features a leukocytosis of 15,500 and a mild thrombocytosis to 435,000. Troponin is undetectable. Patient was kept on BiPAP, given continuous albuterol treatment, and 0.5 mg IV Ativan. She made marked improvement in the ED, but continues to be dyspneic while at rest and will be admitted to the stepdown unit for ongoing evaluation and management of acute exacerbation and COPD with acute hypoxic respiratory failure.     Hospital course:  COPD exacerbation Per pulmonology, this is not her presenting problem, but rather, vocal cord dysfunction. She does seem to have a component of COPD exacerbation, however. Steroids given with bronchodilators. Steroids to be tapered at discharge.  Vocal cord dysfunction Assessed by pulmonology. Speech therapy recommends continued speech therapy as an outpatient. Will need formal evaluation by ENT.  GAD Ativan given as needed. Psychiatry consulted and started patient on Wellbutrin XL and continued Gabapentin. Will send her out on Xanax prn as well as patient's respiratory issues are highly dependent on anxiety control.  Essential hypertension Continued ARB  Acute respiratory failure with hypoxia Thought to be secondary  to presumed COPD exacerbation. Required HFNC and has been weaned down to 3L via [place]. ABG remarkable for hypoxemia.  IgG  deficiency Patient sees a Dr. Steele Berg and receives infusions as an outpatient. She receives IVIG as an outpatient. Follow-up as an outpatient.  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[facility])    Discharge Instructions  Discharge Instructions    Call MD for:  difficulty breathing, headache or visual disturbances    Complete by:  As directed    Call MD for:  persistant dizziness or Vore-headedness    Complete by:  As directed    Call MD for:  persistant nausea and vomiting    Complete by:  As directed    Call MD for:  temperature >100.4    Complete by:  As directed    Diet - low sodium heart healthy    Complete by:  As directed    Increase activity slowly    Complete by:  As directed      Allergies as of 04/28/2017      Reactions   Ambien [zolpidem Tartrate] Shortness Of Breath, Swelling   Tongue swelling, throat swelling. Tongue swelling, throat swelling.   Beta Adrenergic Blockers Anaphylaxis   Fish Allergy Rash, Shortness Of Breath   Iodinated Diagnostic Agents Itching   Benadryl 50MG  prophylaxis before and after and patient reports she did fine   Latex Itching   BLISTERS SKIN   Metoprolol Other (See Comments)   ANY MEDICATION THAT ENDS IN -OLOL-   The patient is asthmatic.   Penicillins Shortness Of Breath, Rash, Other (See Comments)   Has patient had a PCN reaction causing immediate rash, facial/tongue/throat swelling, SOB or lightheadedness with hypotension: Yes Has patient had a PCN reaction causing severe rash involving mucus membranes or skin necrosis: Unknown Has patient had a PCN reaction that required hospitalization No Has patient had a PCN reaction occurring within the last 10  years: No If all of the above answers are "NO", then may proceed with Cephalosporin use.   Red Dye Hives, Shortness Of Breath   Shellfish Allergy Anaphylaxis   Sulfa Antibiotics Hives, Shortness Of Breath   Amoxicillin-pot Clavulanate Nausea And Vomiting   Aspirin Other (See Comments)   Blisters on tongue   Dye Fdc Red  [amaranth (fd&c Red #2)] Hives   X ray DYE  (BENADRYL 50 MG PROPHYLAXIS AND AFTER AND  SHE DID FINE, PER PATIENT.).   Other Hives   Tape Dermatitis, Rash   Levofloxacin Rash   headache   Seroquel [quetiapine Fumarate] Other (See Comments)   Unknown      Medication List    STOP taking these medications   albuterol 108 (90 Base) MCG/ACT inhaler Commonly known as:  PROVENTIL HFA;VENTOLIN HFA   amLODipine 5 MG tablet Commonly known as:  NORVASC   DELSYM 30 MG/5ML liquid Generic drug:  dextromethorphan   diazepam 5 MG tablet Commonly known as:  VALIUM   doxycycline 100 MG capsule Commonly known as:  VIBRAMYCIN   oxyCODONE-acetaminophen 10-325 MG tablet Commonly known as:  PERCOCET     TAKE these medications   baclofen 10 MG tablet Commonly known as:  LIORESAL Take 5 mg by mouth 3 (three) times daily.  benzonatate 100 MG capsule Commonly known as:  TESSALON Take 1 capsule (100 mg total) by mouth 3 (three) times daily as needed for cough.   budesonide-formoterol 160-4.5 MCG/ACT inhaler Commonly known as:  SYMBICORT Inhale 2 puffs into the lungs 2 (two) times daily.   buPROPion 300 MG 24 hr tablet Commonly known as:  WELLBUTRIN XL Take 1 tablet (300 mg total) by mouth daily. Start taking on:  04/29/2017   cetirizine 10 MG tablet Commonly known as:  ZYRTEC Take 10 mg by mouth daily.   cholecalciferol 1000 units tablet Commonly known as:  VITAMIN D Take 2,000 Units by mouth daily.   dextromethorphan-guaiFENesin 30-600 MG 12hr tablet Commonly known as:  MUCINEX DM Take 1 tablet by mouth 2 (two) times daily.   fluticasone 50 MCG/ACT nasal  spray Commonly known as:  FLONASE Place 1 spray into both nostrils daily.   gabapentin 600 MG tablet Commonly known as:  NEURONTIN Take 1,800 mg by mouth at bedtime.   hydrOXYzine 25 MG tablet Commonly known as:  ATARAX/VISTARIL Take 1 tablet (25 mg total) by mouth every 6 (six) hours as needed for anxiety.   ipratropium-albuterol 0.5-2.5 (3) MG/3ML Soln Commonly known as:  DUONEB Inhale 3 mLs into the lungs every 4 (four) hours as needed for wheezing or shortness of breath.   LORazepam 0.5 MG tablet Commonly known as:  ATIVAN Take 1 tablet (0.5 mg total) by mouth 2 (two) times daily as needed for anxiety.   losartan 100 MG tablet Commonly known as:  COZAAR Take 100 mg by mouth daily.   morphine 30 MG 12 hr tablet Commonly known as:  MS CONTIN Take 1 tablet (30 mg total) by mouth every 12 (twelve) hours.   nicotine 7 mg/24hr patch Commonly known as:  NICODERM CQ - dosed in mg/24 hr Place 1 patch (7 mg total) onto the skin daily. Start taking on:  04/29/2017   polyethylene glycol packet Commonly known as:  MIRALAX / GLYCOLAX Take 17 g by mouth daily.   predniSONE 20 MG tablet Commonly known as:  DELTASONE 40mg  daily for 2 days, then 30mg  daily for 3 days, then 20mg  daily for 3 days, then 10mg  daily for 4 days. What changed:  medication strength  how much to take  how to take this  when to take this  additional instructions   promethazine 25 MG tablet Commonly known as:  PHENERGAN Take 12.5 mg by mouth every 8 (eight) hours as needed for nausea/vomiting.   vitamin B-12 1000 MCG tablet Commonly known as:  CYANOCOBALAMIN Take 4,000 mcg by mouth daily.            Durable Medical Equipment        Start     Ordered   04/26/17 1242  For home use only DME continuous positive airway pressure (CPAP)  Once    Comments:  Adult size medium. Nasal mask. Patient on auto titrate range of 6-15 on CPAP  Question Answer Comment  Patient has OSA or probable OSA Yes    Settings Autotitration   CPAP supplies needed Mask, headgear, cushions, filters, heated tubing and water chamber      04/26/17 1242      Allergies  Allergen Reactions  . Ambien [Zolpidem Tartrate] Shortness Of Breath and Swelling    Tongue swelling, throat swelling. Tongue swelling, throat swelling.  . Beta Adrenergic Blockers Anaphylaxis  . Fish Allergy Rash and Shortness Of Breath  . Iodinated Diagnostic Agents Itching  Benadryl 50MG  prophylaxis before and after and patient reports she did fine  . Latex Itching    BLISTERS SKIN  . Metoprolol Other (See Comments)    ANY MEDICATION THAT ENDS IN -OLOL-   The patient is asthmatic.  Marland Kitchen Penicillins Shortness Of Breath, Rash and Other (See Comments)    Has patient had a PCN reaction causing immediate rash, facial/tongue/throat swelling, SOB or lightheadedness with hypotension: Yes Has patient had a PCN reaction causing severe rash involving mucus membranes or skin necrosis: Unknown Has patient had a PCN reaction that required hospitalization No Has patient had a PCN reaction occurring within the last 10 years: No If all of the above answers are "NO", then may proceed with Cephalosporin use.   . Red Dye Hives and Shortness Of Breath  . Shellfish Allergy Anaphylaxis  . Sulfa Antibiotics Hives and Shortness Of Breath  . Amoxicillin-Pot Clavulanate Nausea And Vomiting  . Aspirin Other (See Comments)    Blisters on tongue  . Dye Fdc Red  [Amaranth (Fd&C Red #2)] Hives    X ray DYE  (BENADRYL 50 MG PROPHYLAXIS AND AFTER AND  SHE DID FINE, PER PATIENT.).  Marland Kitchen Other Hives  . Tape Dermatitis and Rash  . Levofloxacin Rash    headache  . Seroquel [Quetiapine Fumarate] Other (See Comments)    Unknown    Consultations:  Pulmonology   Procedures/Studies: Dg Chest 2 View  Result Date: 04/18/2017 CLINICAL DATA:  Antibiotics for 10 days for severe sinus infection with no relief. Cough for 2 days. Shortness of breath for 1 week. History  of pneumonia in February and March. EXAM: CHEST  2 VIEW COMPARISON:  04/05/2017 FINDINGS: Normal heart size and pulmonary vascularity. Prominent apical cardiac fat pad. No focal airspace disease or consolidation in the lungs. No blunting of costophrenic angles. No pneumothorax. Mediastinal contours appear intact. Postoperative changes in the cervical spine. IMPRESSION: No active cardiopulmonary disease. Electronically Signed   By: Burman Nieves M.D.   On: 04/18/2017 21:04   Dg Chest 2 View  Result Date: 04/05/2017 CLINICAL DATA:  Shortness of Breath EXAM: CHEST  2 VIEW COMPARISON:  February 05, 2017 FINDINGS: There is no edema or consolidation. Heart size and pulmonary vascularity are normal. No adenopathy. There is postoperative change in the lower cervical spine. IMPRESSION: No edema or consolidation. Electronically Signed   By: Bretta Bang III M.D.   On: 04/05/2017 09:46   Dg Chest Port 1 View  Result Date: 04/24/2017 CLINICAL DATA:  Acute respiratory distress, dyspnea and chest pain. EXAM: PORTABLE CHEST 1 VIEW COMPARISON:  04/22/2017 FINDINGS: The patient is slightly rotated to the right. ACDF is partially imaged along the lower cervical spine. The heart size and mediastinal contours are within normal limits. Both lungs are clear. There is atelectasis at the left lung base. No overt pulmonary edema, pneumothorax nor effusion. The visualized skeletal structures are nonacute. IMPRESSION: No active disease.  Left basilar atelectasis. Electronically Signed   By: Tollie Eth M.D.   On: 04/24/2017 00:12   Dg Chest Port 1 View  Result Date: 04/22/2017 CLINICAL DATA:  Initial evaluation for acute aspiration. EXAM: PORTABLE CHEST 1 VIEW COMPARISON:  Prior radiograph from 04/22/2017. FINDINGS: Cardiac and mediastinal silhouettes are stable in size and contour, and remain within normal limits. Lungs mildly hypoinflated. Changes related COPD noted. No focal infiltrates to suggest acute infectious or  aspiration pneumonitis. No pulmonary edema or pleural effusion. No pneumothorax. No acute osseus abnormality.  Cervical ACDF noted. IMPRESSION:  1. No active cardiopulmonary disease. No radiographic evidence for active for a shin or infectious pneumonitis. 2. COPD. Electronically Signed   By: Rise Mu M.D.   On: 04/22/2017 22:37   Dg Chest Portable 1 View  Result Date: 04/22/2017 CLINICAL DATA:  56 y/o  F; respiratory distress.  Patient on BiPAP. EXAM: PORTABLE CHEST 1 VIEW COMPARISON:  04/18/2017 chest radiograph. FINDINGS: The heart size and mediastinal contours are within normal limits. Hyperinflated lungs compatible with COPD. No focal consolidation, effusion, or pneumothorax per The visualized skeletal structures are unremarkable. Partially visualized anterior cervical fusion hardware. IMPRESSION: No acute pulmonary process identified.  COPD. Electronically Signed   By: Mitzi Hansen M.D.   On: 04/22/2017 00:02     Subjective: Patient reports improvement in dyspnea. No chest pain.  Discharge Exam: Vitals:   04/28/17 0400 04/28/17 0808  BP: 132/87   Pulse: 86 77  Resp: 18 16  Temp:  97.7 F (36.5 C)   Vitals:   04/28/17 0400 04/28/17 0808 04/28/17 0814 04/28/17 1121  BP: 132/87     Pulse: 86 77    Resp: 18 16    Temp:  97.7 F (36.5 C)    TempSrc:  Oral    SpO2: 98% 99% 98% 98%  Weight:      Height:        General: Pt is alert, awake, not in acute distress Cardiovascular: RRR, S1/S2 +, no rubs, no gallops Respiratory: Very minimal wheezing bilaterally, prolonged expiratory phase. Abdominal: Soft, NT, ND, bowel sounds + Extremities: no edema, no cyanosis    The results of significant diagnostics from this hospitalization (including imaging, microbiology, ancillary and laboratory) are listed below for reference.     Microbiology: Recent Results (from the past 240 hour(s))  MRSA PCR Screening     Status: None   Collection Time: 04/22/17  1:52 AM   Result Value Ref Range Status   MRSA by PCR NEGATIVE NEGATIVE Final    Comment:        The GeneXpert MRSA Assay (FDA approved for NASAL specimens only), is one component of a comprehensive MRSA colonization surveillance program. It is not intended to diagnose MRSA infection nor to guide or monitor treatment for MRSA infections.      Labs: BNP (last 3 results) No results for input(s): BNP in the last 8760 hours. Basic Metabolic Panel:  Recent Labs Lab 04/21/17 2332 04/22/17 0411 04/23/17 1308 04/26/17 0904  NA 142 139 138 140  K 3.0* 3.1* 4.1 4.3  CL 107 104 104 102  CO2 23 21* 25 28  GLUCOSE 146* 291* 145* 154*  BUN 11 10 14  24*  CREATININE 1.04* 1.22* 1.10* 0.93  CALCIUM 9.2 8.6* 9.2 8.9   Liver Function Tests:  Recent Labs Lab 04/21/17 2332  AST 38  ALT 32  ALKPHOS 71  BILITOT 0.3  PROT 6.6  ALBUMIN 3.9   No results for input(s): LIPASE, AMYLASE in the last 168 hours. No results for input(s): AMMONIA in the last 168 hours. CBC:  Recent Labs Lab 04/21/17 2332 04/23/17 1308 04/28/17 0311  WBC 15.5* 19.0* 13.6*  HGB 14.8 14.1 14.1  HCT 45.9 43.1 44.0  MCV 91.4 90.9 92.4  PLT 435* 393 325   Cardiac Enzymes:  Recent Labs Lab 04/21/17 2332 04/22/17 0836  TROPONINI <0.03 <0.03   BNP: Invalid input(s): POCBNP CBG:  Recent Labs Lab 04/23/17 0741 04/24/17 0838 04/26/17 0755 04/27/17 0812 04/28/17 0825  GLUCAP 169* 182* 137* 89 92  D-Dimer No results for input(s): DDIMER in the last 72 hours. Hgb A1c No results for input(s): HGBA1C in the last 72 hours. Lipid Profile No results for input(s): CHOL, HDL, LDLCALC, TRIG, CHOLHDL, LDLDIRECT in the last 72 hours. Thyroid function studies No results for input(s): TSH, T4TOTAL, T3FREE, THYROIDAB in the last 72 hours.  Invalid input(s): FREET3 Anemia work up No results for input(s): VITAMINB12, FOLATE, FERRITIN, TIBC, IRON, RETICCTPCT in the last 72 hours. Urinalysis No results found  for: COLORURINE, APPEARANCEUR, LABSPEC, PHURINE, GLUCOSEU, HGBUR, BILIRUBINUR, KETONESUR, PROTEINUR, UROBILINOGEN, NITRITE, LEUKOCYTESUR Sepsis Labs Invalid input(s): PROCALCITONIN,  WBC,  LACTICIDVEN Microbiology Recent Results (from the past 240 hour(s))  MRSA PCR Screening     Status: None   Collection Time: 04/22/17  1:52 AM  Result Value Ref Range Status   MRSA by PCR NEGATIVE NEGATIVE Final    Comment:        The GeneXpert MRSA Assay (FDA approved for NASAL specimens only), is one component of a comprehensive MRSA colonization surveillance program. It is not intended to diagnose MRSA infection nor to guide or monitor treatment for MRSA infections.      Time coordinating discharge: Over 30 minutes  SIGNED:   Jacquelin Hawking, MD [facility] 04/28/2017, 3:05 PM Pager [phone]  If 7PM-7AM, please contact night-coverage [email or web address] Password [email or web address]
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
## 2017-04-28 NOTE — Progress Notes (Signed)
[facility] Anticipated discharge date:5/18 Family notified: Building services engineerBetty Transportation by Sharin MonsPTAR- called at 4:15pm  CSW signing off.  Burna SisJenna H. Terek Bee, LCSW Clinical Social Worker [phone]

## 2017-04-28 NOTE — Progress Notes (Signed)
Just gave report  To [facility] staff, vital signs stable, discharge instructions given to the patient, PTAR getting ready to take the patient to facility.

## 2017-04-28 NOTE — Progress Notes (Signed)
   04/28/17 1545  Clinical Encounter Type  Visited With Patient  Visit Type Other (Comment) ([place] consult)  Spiritual Encounters  Spiritual Needs Emotional;Prayer  Stress Factors  Patient Stress Factors Health changes;Family relationships  Introduction to Pt. Pt feeling stressed about going to SNF. Problems with children and her health. Offered prayer of comfort and hope.

## 2017-04-28 NOTE — Clinical Social Work Placement (Signed)
   CLINICAL SOCIAL WORK PLACEMENT  NOTE  Date:  04/28/2017  Patient Details  Name: Deanna Yang MRN: [id number] Date of Birth: 19-May-1961  Clinical Social Work is seeking post-discharge placement for this patient at the Skilled  Nursing Facility level of care (*CSW will initial, date and re-position this form in  chart as items are completed):  Yes   Patient/family provided with [place] Clinical Social Work Department's list of facilities offering this level of care within the geographic area requested by the patient (or if unable, by the patient's family).  Yes   Patient/family informed of their freedom to choose among providers that offer the needed level of care, that participate in Medicare, Medicaid or managed care program needed by the patient, have an available bed and are willing to accept the patient.  Yes   Patient/family informed of [facility]'s ownership interest in [facility] and [facility], as well as of the fact that they are under no obligation to receive care at these facilities.  PASRR submitted to EDS on 04/25/17     PASRR number received on 04/26/17     Existing PASRR number confirmed on       FL2 transmitted to all facilities in geographic area requested by pt/family on 04/25/17     FL2 transmitted to all facilities within larger geographic area on       Patient informed that his/her managed care company has contracts with or will negotiate with certain facilities, including the following:        Yes   Patient/family informed of bed offers received.  [facility]     Physician recommends and patient chooses bed at      [facility] on 04/28/17.  Patient to be transferred to facility by       Patient family notified on 04/28/17 of transfer.  Name of family member notified:  Kathie RhodesBetty     PHYSICIAN Please sign FL2     Additional Comment:     _______________________________________________ Burna SisUris, Sheritta Deeg H, LCSW 04/28/2017, 4:17 PM

## 2017-05-01 ENCOUNTER — Non-Acute Institutional Stay (SKILLED_NURSING_FACILITY): Payer: Medicare Other | Admitting: Internal Medicine

## 2017-05-01 ENCOUNTER — Encounter: Payer: Self-pay | Admitting: Internal Medicine

## 2017-05-01 DIAGNOSIS — R531 Weakness: Secondary | ICD-10-CM

## 2017-05-01 DIAGNOSIS — Z720 Tobacco use: Secondary | ICD-10-CM

## 2017-05-01 DIAGNOSIS — D801 Nonfamilial hypogammaglobulinemia: Secondary | ICD-10-CM | POA: Diagnosis not present

## 2017-05-01 DIAGNOSIS — D72829 Elevated white blood cell count, unspecified: Secondary | ICD-10-CM | POA: Diagnosis not present

## 2017-05-01 DIAGNOSIS — J441 Chronic obstructive pulmonary disease with (acute) exacerbation: Secondary | ICD-10-CM

## 2017-05-01 DIAGNOSIS — J0100 Acute maxillary sinusitis, unspecified: Secondary | ICD-10-CM | POA: Diagnosis not present

## 2017-05-01 DIAGNOSIS — I10 Essential (primary) hypertension: Secondary | ICD-10-CM

## 2017-05-01 DIAGNOSIS — K5909 Other constipation: Secondary | ICD-10-CM | POA: Diagnosis not present

## 2017-05-01 DIAGNOSIS — J383 Other diseases of vocal cords: Secondary | ICD-10-CM

## 2017-05-01 DIAGNOSIS — G894 Chronic pain syndrome: Secondary | ICD-10-CM

## 2017-05-01 DIAGNOSIS — F411 Generalized anxiety disorder: Secondary | ICD-10-CM

## 2017-05-01 DIAGNOSIS — G4733 Obstructive sleep apnea (adult) (pediatric): Secondary | ICD-10-CM

## 2017-05-01 NOTE — Progress Notes (Signed)
LOCATION: [name]  PCP: Salli Quarry, PA-C   Code Status:Full Code   Goals of care: Advanced Directive information Advanced Directives 04/22/2017  Does Patient Have a Medical Advance Directive? No  Would patient like information on creating a medical advance directive? No - Patient declined       Extended Emergency Contact Information Primary Emergency Contact: Offenberger,Betty Address: [address]          [place], [place] [address] [place] Home Phone: [phone] Relation: Mother Secondary Emergency Contact: Raphael Gibney [place] Mobile Phone: [phone] Relation: Son   Allergies  Allergen Reactions  . Ambien [Zolpidem Tartrate] Shortness Of Breath and Swelling    Tongue swelling, throat swelling. Tongue swelling, throat swelling.  . Beta Adrenergic Blockers Anaphylaxis  . Fish Allergy Rash and Shortness Of Breath  . Iodinated Diagnostic Agents Itching    Benadryl 50MG  prophylaxis before and after and patient reports she did fine  . Latex Itching    BLISTERS SKIN  . Metoprolol Other (See Comments)    ANY MEDICATION THAT ENDS IN -OLOL-   The patient is asthmatic.  Marland Kitchen Penicillins Shortness Of Breath, Rash and Other (See Comments)    Has patient had a PCN reaction causing immediate rash, facial/tongue/throat swelling, SOB or lightheadedness with hypotension: Yes Has patient had a PCN reaction causing severe rash involving mucus membranes or skin necrosis: Unknown Has patient had a PCN reaction that required hospitalization No Has patient had a PCN reaction occurring within the last 10 years: No If all of the above answers are "NO", then may proceed with Cephalosporin use.   . Red Dye Hives and Shortness Of Breath  . Shellfish Allergy Anaphylaxis  . Sulfa Antibiotics Hives and Shortness Of Breath  . Amoxicillin-Pot Clavulanate Nausea And Vomiting  . Aspirin Other (See Comments)    Blisters on tongue  . Dye Fdc Red   [Amaranth (Fd&C Red #2)] Hives    X ray DYE  (BENADRYL 50 MG PROPHYLAXIS AND AFTER AND  SHE DID FINE, PER PATIENT.).  Marland Kitchen Other Hives  . Tape Dermatitis and Rash  . Levofloxacin Rash    headache  . Seroquel [Quetiapine Fumarate] Other (See Comments)    Unknown    Chief Complaint  Patient presents with  . New Admit To SNF    New Admission Visit      HPI:  Patient is a 56 y.o. female seen today for short term rehabilitation post hospital admission from 04/21/17-04/28/17 with acute hypoxic respiratory failure from COPD exacerbation. She required BiPAP, iv solumedrol and bronchodilators. She was noted to have vocal cord dysfunction an. She was seen by pulmonology and SLP. She was also seen by psychiatry service for anxiety and started on wellbutrin with prn ativan. She has medical history of COPD, HTN, depression, chronic pain, IgG deficiency among others. She is seen in her room today.  Review of Systems:  Constitutional: Negative for fever, chills, diaphoresis.  HENT: Negative for headache,sore throat, difficulty swallowing. Positive for sinus congestion and nasal discharge.   Eyes: Negative for eye pain, blurred vision, double vision and discharge.  Respiratory: Negative for shortness of breath at rest. Positive for cough with green phlegm x 4 days Cardiovascular: Negative for palpitations, chest pain, leg swelling. Positive for chest tightness when anxious.  Gastrointestinal: Negative for vomiting, loss of appetite, melena, diarrhea and constipation. Positive for occasional nausea, heartburn and chronic abdominal pain. Last bowel movement was 4 days back. At home had  bowel movement once a week. Genitourinary: Negative for dysuria. Positive for urinary incontinence.  Musculoskeletal: Positive for chronic back pain and knee pain. She had a fall in the facility on Saturday. History of multiple back and abdominal surgeries    Skin: Negative for itching, rash.  Neurological: Negative for  dizziness. Psychiatric/Behavioral: Negative for depression. Positive for anxiety.   Past Medical History:  Diagnosis Date  . Anxiety   . COPD (chronic obstructive pulmonary disease) (HCC)   . Depression   . Fibromyalgia   . Hypertension   . Hypertension   . Manic depression (HCC)   . Neuropathy   . OSA (obstructive sleep apnea)    Past Surgical History:  Procedure Laterality Date  . ABDOMINAL SURGERY    . APPENDECTOMY    . BACK SURGERY    . BREAST REDUCTION SURGERY    . BREAST SURGERY    . CARPAL TUNNEL RELEASE Bilateral   . CESAREAN SECTION    . CHOLECYSTECTOMY    . HERNIA REPAIR    . JOINT REPLACEMENT    . KNEE SURGERY    . TONSILLECTOMY     Social History:   reports that she has been smoking Cigarettes.  She has been smoking about 1.00 pack per day. She has never used smokeless tobacco. She reports that she does not drink alcohol or use drugs.  Family History  Problem Relation Age of Onset  . Alcohol abuse Mother   . Alcohol abuse Father   . Depression Sister   . Dementia Neg Hx     Medications: Allergies as of 05/01/2017      Reactions   Ambien [zolpidem Tartrate] Shortness Of Breath, Swelling   Tongue swelling, throat swelling. Tongue swelling, throat swelling.   Beta Adrenergic Blockers Anaphylaxis   Fish Allergy Rash, Shortness Of Breath   Iodinated Diagnostic Agents Itching   Benadryl 50MG  prophylaxis before and after and patient reports she did fine   Latex Itching   BLISTERS SKIN   Metoprolol Other (See Comments)   ANY MEDICATION THAT ENDS IN -OLOL-   The patient is asthmatic.   Penicillins Shortness Of Breath, Rash, Other (See Comments)   Has patient had a PCN reaction causing immediate rash, facial/tongue/throat swelling, SOB or lightheadedness with hypotension: Yes Has patient had a PCN reaction causing severe rash involving mucus membranes or skin necrosis: Unknown Has patient had a PCN reaction that required hospitalization No Has patient had  a PCN reaction occurring within the last 10 years: No If all of the above answers are "NO", then may proceed with Cephalosporin use.   Red Dye Hives, Shortness Of Breath   Shellfish Allergy Anaphylaxis   Sulfa Antibiotics Hives, Shortness Of Breath   Amoxicillin-pot Clavulanate Nausea And Vomiting   Aspirin Other (See Comments)   Blisters on tongue   Dye Fdc Red  [amaranth (fd&c Red #2)] Hives   X ray DYE  (BENADRYL 50 MG PROPHYLAXIS AND AFTER AND  SHE DID FINE, PER PATIENT.).   Other Hives   Tape Dermatitis, Rash   Levofloxacin Rash   headache   Seroquel [quetiapine Fumarate] Other (See Comments)   Unknown      Medication List       Accurate as of 05/01/17 11:32 AM. Always use your most recent med list.          baclofen 10 MG tablet Commonly known as:  LIORESAL Take 5 mg by mouth 3 (three) times daily.   benzonatate 100 MG capsule Commonly  known as:  TESSALON Take 1 capsule (100 mg total) by mouth 3 (three) times daily as needed for cough.   budesonide-formoterol 160-4.5 MCG/ACT inhaler Commonly known as:  SYMBICORT Inhale 2 puffs into the lungs 2 (two) times daily.   buPROPion 300 MG 24 hr tablet Commonly known as:  WELLBUTRIN XL Take 1 tablet (300 mg total) by mouth daily.   cetirizine 10 MG tablet Commonly known as:  ZYRTEC Take 10 mg by mouth daily.   cholecalciferol 1000 units tablet Commonly known as:  VITAMIN D Take 2,000 Units by mouth daily.   dextromethorphan-guaiFENesin 30-600 MG 12hr tablet Commonly known as:  MUCINEX DM Take 1 tablet by mouth 2 (two) times daily.   fluticasone 50 MCG/ACT nasal spray Commonly known as:  FLONASE Place 1 spray into both nostrils daily.   gabapentin 600 MG tablet Commonly known as:  NEURONTIN Take 1,800 mg by mouth at bedtime.   hydrOXYzine 25 MG tablet Commonly known as:  ATARAX/VISTARIL Take 25 mg by mouth 3 (three) times daily as needed.   ipratropium-albuterol 0.5-2.5 (3) MG/3ML Soln Commonly known as:   DUONEB Inhale 3 mLs into the lungs every 4 (four) hours as needed for wheezing or shortness of breath.   LORazepam 0.5 MG tablet Commonly known as:  ATIVAN Take 1 tablet (0.5 mg total) by mouth 2 (two) times daily as needed for anxiety.   losartan 100 MG tablet Commonly known as:  COZAAR Take 100 mg by mouth daily.   morphine 30 MG 12 hr tablet Commonly known as:  MS CONTIN Take 1 tablet (30 mg total) by mouth every 12 (twelve) hours.   nicotine 7 mg/24hr patch Commonly known as:  NICODERM CQ - dosed in mg/24 hr Place 1 patch (7 mg total) onto the skin daily.   polyethylene glycol packet Commonly known as:  MIRALAX / GLYCOLAX Take 17 g by mouth daily.   predniSONE 20 MG tablet Commonly known as:  DELTASONE 40mg  daily for 2 days, then 30mg  daily for 3 days, then 20mg  daily for 3 days, then 10mg  daily for 4 days.   promethazine 25 MG tablet Commonly known as:  PHENERGAN Take 12.5 mg by mouth every 8 (eight) hours as needed for nausea/vomiting.   vitamin B-12 1000 MCG tablet Commonly known as:  CYANOCOBALAMIN Take 4,000 mcg by mouth daily.       Immunizations:  There is no immunization history on file for this patient.   Physical Exam: Vitals:   05/01/17 1121  BP: (!) 146/90  Pulse: 78  Resp: 18  Temp: 98.1 F (36.7 C)  TempSrc: Oral  SpO2: 98%  Weight: 208 lb (94.3 kg)  Height: 5\' 7"  (1.702 m)   Body mass index is 32.58 kg/m.  General- adult female, obese, in no acute distress Head- normocephalic, atraumatic Nose- + maxillary sinus tenderness, no nasal discharge Throat- moist mucus membrane, normal oropharynx  Eyes- PERRLA, EOMI, no pallor, no icterus, no discharge, normal conjunctiva, normal sclera Neck- no cervical lymphadenopathy Cardiovascular- normal s1,s2, no murmur Respiratory- bilateral clear to auscultation, occassional wheeze, no rhonchi, no crackles, no use of accessory muscles Abdomen- bowel sounds present, soft, non tender, no guarding or  rigidity Musculoskeletal- able to move all 4 extremities, generalized weakness, trace leg edema Neurological- alert and oriented to person, place and time Skin- warm and dry Psychiatry- anxious and tearful   Labs reviewed: Basic Metabolic Panel:  Recent Labs  16/10/96 0411 04/23/17 1308 04/26/17 0904  NA 139 138 140  K 3.1* 4.1 4.3  CL 104 104 102  CO2 21* 25 28  GLUCOSE 291* 145* 154*  BUN 10 14 24*  CREATININE 1.22* 1.10* 0.93  CALCIUM 8.6* 9.2 8.9   Liver Function Tests:  Recent Labs  04/21/17 2332  AST 38  ALT 32  ALKPHOS 71  BILITOT 0.3  PROT 6.6  ALBUMIN 3.9   No results for input(s): LIPASE, AMYLASE in the last 8760 hours. No results for input(s): AMMONIA in the last 8760 hours. CBC:  Recent Labs  02/05/17 0738 04/05/17 0852 04/18/17 2115 04/21/17 2332 04/23/17 1308 04/28/17 0311  WBC 11.6* 12.6* 9.5 15.5* 19.0* 13.6*  NEUTROABS 8.4* 11.2* 7.3  --   --   --   HGB 14.8 13.7 13.8 14.8 14.1 14.1  HCT 44.7 40.9 40.5 45.9 43.1 44.0  MCV 89.8 89.1 88.6 91.4 90.9 92.4  PLT 300 315 309 435* 393 325   Cardiac Enzymes:  Recent Labs  04/21/17 2332 04/22/17 0836  TROPONINI <0.03 <0.03   BNP: Invalid input(s): POCBNP CBG:  Recent Labs  04/26/17 0755 04/27/17 0812 04/28/17 0825  GLUCAP 137* 89 92    Radiological Exams: Dg Chest 2 View  Result Date: 04/18/2017 CLINICAL DATA:  Antibiotics for 10 days for severe sinus infection with no relief. Cough for 2 days. Shortness of breath for 1 week. History of pneumonia in February and March. EXAM: CHEST  2 VIEW COMPARISON:  04/05/2017 FINDINGS: Normal heart size and pulmonary vascularity. Prominent apical cardiac fat pad. No focal airspace disease or consolidation in the lungs. No blunting of costophrenic angles. No pneumothorax. Mediastinal contours appear intact. Postoperative changes in the cervical spine. IMPRESSION: No active cardiopulmonary disease. Electronically Signed   By: Burman Nieves M.D.    On: 04/18/2017 21:04   Dg Chest 2 View  Result Date: 04/05/2017 CLINICAL DATA:  Shortness of Breath EXAM: CHEST  2 VIEW COMPARISON:  February 05, 2017 FINDINGS: There is no edema or consolidation. Heart size and pulmonary vascularity are normal. No adenopathy. There is postoperative change in the lower cervical spine. IMPRESSION: No edema or consolidation. Electronically Signed   By: Bretta Bang III M.D.   On: 04/05/2017 09:46   Dg Chest Port 1 View  Result Date: 04/24/2017 CLINICAL DATA:  Acute respiratory distress, dyspnea and chest pain. EXAM: PORTABLE CHEST 1 VIEW COMPARISON:  04/22/2017 FINDINGS: The patient is slightly rotated to the right. ACDF is partially imaged along the lower cervical spine. The heart size and mediastinal contours are within normal limits. Both lungs are clear. There is atelectasis at the left lung base. No overt pulmonary edema, pneumothorax nor effusion. The visualized skeletal structures are nonacute. IMPRESSION: No active disease.  Left basilar atelectasis. Electronically Signed   By: Tollie Eth M.D.   On: 04/24/2017 00:12   Dg Chest Port 1 View  Result Date: 04/22/2017 CLINICAL DATA:  Initial evaluation for acute aspiration. EXAM: PORTABLE CHEST 1 VIEW COMPARISON:  Prior radiograph from 04/22/2017. FINDINGS: Cardiac and mediastinal silhouettes are stable in size and contour, and remain within normal limits. Lungs mildly hypoinflated. Changes related COPD noted. No focal infiltrates to suggest acute infectious or aspiration pneumonitis. No pulmonary edema or pleural effusion. No pneumothorax. No acute osseus abnormality.  Cervical ACDF noted. IMPRESSION: 1. No active cardiopulmonary disease. No radiographic evidence for active for a shin or infectious pneumonitis. 2. COPD. Electronically Signed   By: Rise Mu M.D.   On: 04/22/2017 22:37   Dg Chest Portable 1 View  Result Date: 04/22/2017 CLINICAL DATA:  56 y/o  F; respiratory distress.  Patient on  BiPAP. EXAM: PORTABLE CHEST 1 VIEW COMPARISON:  04/18/2017 chest radiograph. FINDINGS: The heart size and mediastinal contours are within normal limits. Hyperinflated lungs compatible with COPD. No focal consolidation, effusion, or pneumothorax per The visualized skeletal structures are unremarkable. Partially visualized anterior cervical fusion hardware. IMPRESSION: No acute pulmonary process identified.  COPD. Electronically Signed   By: Mitzi HansenLance  Furusawa-Stratton M.D.   On: 04/22/2017 00:02    Assessment/Plan  Generalized weakness From deconditioning. Will have her work with physical therapy and occupational therapy team to help with gait training and muscle strengthening exercises.fall precautions. Skin care. Encourage to be out of bed.   COPD with acute exacerbation.  With recent exacerbation. Continue and complete tapering course of prednisone on 05/10/17. counselled on smoking cessation. Continue symbicort bid. Continue mucinex dm for cough. Continue duoneb as needed. Wean off oxygen to room air as tolerated. Pulmonology follow up.  Vocal cord dysfunction Will make follow up with SLP and ENT for evaluation  Acute sinusitis Start doxycycline 100 mg q12h x 1 week. Monitor wbc and temp curve  Leukocytosis Currently on prednisone and also has clinical symptom of sinus infection. Both of these could contribute to elevated wbc, monitor clinically  Chronic pain syndrome S/p multiple back and abdominal surgeries. Was taking morphine 30 mg bid and oxycodone-apap 10-325 mg q6h prn at home. Currently only on morphine for unclear reason. Resume her oxycodone-apap at q6h prn pain and get PMR consult. Patient is [facility] pain [facility]. Reviewed OV note from 03/07/17.Marland Kitchen. Continue her gabapentin and baclofen.   GAD Seen by psych service in hospital. Continue bupropion 300 mg qd, lorazepam 0.5 mg bid prn and hydroxyzine prn. Psychiatry consult    OSA Continue CPAP at bedtime. 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 She received this at home on monthly basis and mentions she has not received it for this month. Will have facility staff follow on this and administer if due.   Tobacco use counselled on smoking cessation. Continue nicotine patch.   HTN Elevated BP, monitor BP. Continue losartan 100 mg daily. Check bmp  Chronic constipation Likely opioid induced. Currently on miralax daily. Add senna s 2 tab qhs.    Goals of care: short term rehabilitation   Labs/tests ordered: cbc, bmp  Family/ staff Communication: reviewed care plan with patient and nursing supervisor    Oneal GroutMAHIMA Almee Pelphrey, MD Internal Medicine [facility] [address] [place], [place] [address] Cell Phone (Monday-Friday 8 am - 5 pm): [phone] On Call: [phone] and follow prompts after 5 pm and on weekends Office Phone: [phone] Office Fax: [phone]

## 2017-05-04 ENCOUNTER — Non-Acute Institutional Stay (SKILLED_NURSING_FACILITY): Payer: Medicare Other | Admitting: Family

## 2017-05-04 ENCOUNTER — Encounter: Payer: Self-pay | Admitting: Family

## 2017-05-04 DIAGNOSIS — K5901 Slow transit constipation: Secondary | ICD-10-CM | POA: Diagnosis not present

## 2017-05-04 DIAGNOSIS — F3132 Bipolar disorder, current episode depressed, moderate: Secondary | ICD-10-CM

## 2017-05-04 DIAGNOSIS — J449 Chronic obstructive pulmonary disease, unspecified: Secondary | ICD-10-CM | POA: Diagnosis not present

## 2017-05-04 DIAGNOSIS — G894 Chronic pain syndrome: Secondary | ICD-10-CM | POA: Diagnosis not present

## 2017-05-04 DIAGNOSIS — I10 Essential (primary) hypertension: Secondary | ICD-10-CM | POA: Diagnosis not present

## 2017-05-04 DIAGNOSIS — F4310 Post-traumatic stress disorder, unspecified: Secondary | ICD-10-CM | POA: Diagnosis not present

## 2017-05-04 DIAGNOSIS — R2681 Unsteadiness on feet: Secondary | ICD-10-CM | POA: Diagnosis not present

## 2017-05-05 NOTE — Progress Notes (Signed)
Location:  [facility] Room Number: [phone] Place of Service:  SNF [phone])  Provider: Richarda Blade FNP-C   PCP: Salli Quarry, PA-C Patient Care Team: Salli Quarry, PA-C as PCP - General (Physician Assistant)  Extended Emergency Contact Information Primary Emergency Contact: Swaney,Betty Address: [address]          [place], [place] [address] [place] Home Phone: [phone] Relation: Mother Secondary Emergency Contact: Raphael Gibney [place] of [place] Mobile Phone: [phone] Relation: Son  Code Status: Full code  Goals of care:  Advanced Directive information Advanced Directives 05/04/2017  Does Patient Have a Medical Advance Directive? No  Would patient like information on creating a medical advance directive? -     Allergies  Allergen Reactions  . Ambien [Zolpidem Tartrate] Shortness Of Breath and Swelling    Tongue swelling, throat swelling. Tongue swelling, throat swelling.  . Beta Adrenergic Blockers Anaphylaxis  . Fish Allergy Rash and Shortness Of Breath  . Iodinated Diagnostic Agents Itching    Benadryl 50MG  prophylaxis before and after and patient reports she did fine  . Latex Itching    BLISTERS SKIN  . Metoprolol Other (See Comments)    ANY MEDICATION THAT ENDS IN -OLOL-   The patient is asthmatic.  Marland Kitchen Penicillins Shortness Of Breath, Rash and Other (See Comments)    Has patient had a PCN reaction causing immediate rash, facial/tongue/throat swelling, SOB or lightheadedness with hypotension: Yes Has patient had a PCN reaction causing severe rash involving mucus membranes or skin necrosis: Unknown Has patient had a PCN reaction that required hospitalization No Has patient had a PCN reaction occurring within the last 10 years: No If all of the above answers are "NO", then may proceed with Cephalosporin use.   . Red Dye Hives and Shortness Of Breath  . Shellfish Allergy Anaphylaxis  . Sulfa  Antibiotics Hives and Shortness Of Breath  . Amoxicillin-Pot Clavulanate Nausea And Vomiting  . Aspirin Other (See Comments)    Blisters on tongue  . Dye Fdc Red  [Amaranth (Fd&C Red #2)] Hives    X ray DYE  (BENADRYL 50 MG PROPHYLAXIS AND AFTER AND  SHE DID FINE, PER PATIENT.).  Marland Kitchen Other Hives  . Tape Dermatitis and Rash  . Levofloxacin Rash    headache  . Seroquel [Quetiapine Fumarate] Other (See Comments)    Unknown    Chief Complaint  Patient presents with  . Discharge Note    discharge from [facility]    HPI:  56 y.o. female seen today at [facility] for discharge home.She was here for short term rehabilitation for post hospital admission from 04/21/2017-04/28/2017 with acute hypoxic respiratory failure from COPD exacerbation. She required BiPAP, IV solumedrol and bronchodilators. She was noted to have vocal cord dysfunction. She was seen by pulmonology and SLP. She was also seen by psychiatry service for anxiety and started on wellbutrin with prn ativan. She has a medical history of HTN, COPD, Bipolar disorder, Anxiety, depression, chronic pain, IgG deficiency among other conditions. She is seen in her room today.She states was seen today by her pain management specialist Dr. Joycelyn Man her percocet and MS contin pain medication was refilled.   She has worked well with PT/OT now stable for discharge home.She will be discharged home with Home health PT/OT to continue with ROM, Exercise, Gait stability and muscle strengthening. She will require  DME Rollator to allow her to maintain current level of independence with  ADL's.Home health Respiratory Therapist to evaluate for portable oxygen. Home health services will be arranged by facility social worker prior to discharge.She will be discharged home with her medication from the facility. Prescription medication will be written x 1 month then patient to follow up with PCP in 1-2 weeks. Facility staff report no new  concerns.    Past Medical History:  Diagnosis Date  . Anxiety   . COPD (chronic obstructive pulmonary disease) (HCC)   . Depression   . Fibromyalgia   . Hypertension   . Hypertension   . Manic depression (HCC)   . Neuropathy   . OSA (obstructive sleep apnea)     Past Surgical History:  Procedure Laterality Date  . ABDOMINAL SURGERY    . APPENDECTOMY    . BACK SURGERY    . BREAST REDUCTION SURGERY    . BREAST SURGERY    . CARPAL TUNNEL RELEASE Bilateral   . CESAREAN SECTION    . CHOLECYSTECTOMY    . HERNIA REPAIR    . JOINT REPLACEMENT    . KNEE SURGERY    . TONSILLECTOMY        reports that she has been smoking Cigarettes.  She has been smoking about 1.00 pack per day. She has never used smokeless tobacco. She reports that she does not drink alcohol or use drugs. Social History   Social History  . Marital status: Legally Separated    Spouse name: N/A  . Number of children: N/A  . Years of education: N/A   Occupational History  . Not on file.   Social History Main Topics  . Smoking status: Current Every Day Smoker    Packs/day: 1.00    Types: Cigarettes  . Smokeless tobacco: Never Used  . Alcohol use No  . Drug use: No  . Sexual activity: Not Currently   Other Topics Concern  . Not on file   Social History Narrative  . No narrative on file    Allergies  Allergen Reactions  . Ambien [Zolpidem Tartrate] Shortness Of Breath and Swelling    Tongue swelling, throat swelling. Tongue swelling, throat swelling.  . Beta Adrenergic Blockers Anaphylaxis  . Fish Allergy Rash and Shortness Of Breath  . Iodinated Diagnostic Agents Itching    Benadryl 50MG  prophylaxis before and after and patient reports she did fine  . Latex Itching    BLISTERS SKIN  . Metoprolol Other (See Comments)    ANY MEDICATION THAT ENDS IN -OLOL-   The patient is asthmatic.  Marland Kitchen Penicillins Shortness Of Breath, Rash and Other (See Comments)    Has patient had a PCN reaction causing  immediate rash, facial/tongue/throat swelling, SOB or lightheadedness with hypotension: Yes Has patient had a PCN reaction causing severe rash involving mucus membranes or skin necrosis: Unknown Has patient had a PCN reaction that required hospitalization No Has patient had a PCN reaction occurring within the last 10 years: No If all of the above answers are "NO", then may proceed with Cephalosporin use.   . Red Dye Hives and Shortness Of Breath  . Shellfish Allergy Anaphylaxis  . Sulfa Antibiotics Hives and Shortness Of Breath  . Amoxicillin-Pot Clavulanate Nausea And Vomiting  . Aspirin Other (See Comments)    Blisters on tongue  . Dye Fdc Red  [Amaranth (Fd&C Red #2)] Hives    X ray DYE  (BENADRYL 50 MG PROPHYLAXIS AND AFTER AND  SHE DID FINE, PER PATIENT.).  Marland Kitchen Other Hives  . Tape Dermatitis and  Rash  . Levofloxacin Rash    headache  . Seroquel [Quetiapine Fumarate] Other (See Comments)    Unknown    Pertinent  Health Maintenance Due  Topic Date Due  . PAP SMEAR  08/11/1983  . MAMMOGRAM  08/10/2012  . COLONOSCOPY  08/10/2012  . INFLUENZA VACCINE  07/12/2017    Medications: Allergies as of 05/04/2017      Reactions   Ambien [zolpidem Tartrate] Shortness Of Breath, Swelling   Tongue swelling, throat swelling. Tongue swelling, throat swelling.   Beta Adrenergic Blockers Anaphylaxis   Fish Allergy Rash, Shortness Of Breath   Iodinated Diagnostic Agents Itching   Benadryl 50MG  prophylaxis before and after and patient reports she did fine   Latex Itching   BLISTERS SKIN   Metoprolol Other (See Comments)   ANY MEDICATION THAT ENDS IN -OLOL-   The patient is asthmatic.   Penicillins Shortness Of Breath, Rash, Other (See Comments)   Has patient had a PCN reaction causing immediate rash, facial/tongue/throat swelling, SOB or lightheadedness with hypotension: Yes Has patient had a PCN reaction causing severe rash involving mucus membranes or skin necrosis: Unknown Has patient  had a PCN reaction that required hospitalization No Has patient had a PCN reaction occurring within the last 10 years: No If all of the above answers are "NO", then may proceed with Cephalosporin use.   Red Dye Hives, Shortness Of Breath   Shellfish Allergy Anaphylaxis   Sulfa Antibiotics Hives, Shortness Of Breath   Amoxicillin-pot Clavulanate Nausea And Vomiting   Aspirin Other (See Comments)   Blisters on tongue   Dye Fdc Red  [amaranth (fd&c Red #2)] Hives   X ray DYE  (BENADRYL 50 MG PROPHYLAXIS AND AFTER AND  SHE DID FINE, PER PATIENT.).   Other Hives   Tape Dermatitis, Rash   Levofloxacin Rash   headache   Seroquel [quetiapine Fumarate] Other (See Comments)   Unknown      Medication List       Accurate as of 05/04/17 11:59 PM. Always use your most recent med list.          baclofen 10 MG tablet Commonly known as:  LIORESAL Take 5 mg by mouth 3 (three) times daily.   benzonatate 100 MG capsule Commonly known as:  TESSALON Take 1 capsule (100 mg total) by mouth 3 (three) times daily as needed for cough.   bisacodyl 10 MG suppository Commonly known as:  DULCOLAX Place 10 mg rectally daily as needed for moderate constipation.   budesonide-formoterol 160-4.5 MCG/ACT inhaler Commonly known as:  SYMBICORT Inhale 2 puffs into the lungs 2 (two) times daily.   buPROPion 300 MG 24 hr tablet Commonly known as:  WELLBUTRIN XL Take 1 tablet (300 mg total) by mouth daily.   cetirizine 10 MG tablet Commonly known as:  ZYRTEC Take 10 mg by mouth daily.   cholecalciferol 1000 units tablet Commonly known as:  VITAMIN D Take 2,000 Units by mouth daily.   dextromethorphan-guaiFENesin 30-600 MG 12hr tablet Commonly known as:  MUCINEX DM Take 1 tablet by mouth 2 (two) times daily.   doxycycline 100 MG capsule Commonly known as:  VIBRAMYCIN Take 100 mg by mouth 2 (two) times daily.   fluticasone 50 MCG/ACT nasal spray Commonly known as:  FLONASE Place 1 spray into both  nostrils daily.   gabapentin 600 MG tablet Commonly known as:  NEURONTIN Take 1,800 mg by mouth at bedtime.   hydrOXYzine 25 MG tablet Commonly known as:  ATARAX/VISTARIL  Take 25 mg by mouth 3 (three) times daily as needed.   ipratropium-albuterol 0.5-2.5 (3) MG/3ML Soln Commonly known as:  DUONEB Inhale 3 mLs into the lungs every 4 (four) hours as needed for wheezing or shortness of breath.   LORazepam 0.5 MG tablet Commonly known as:  ATIVAN Take 1 tablet (0.5 mg total) by mouth 2 (two) times daily as needed for anxiety.   losartan 100 MG tablet Commonly known as:  COZAAR Take 100 mg by mouth daily.   morphine 30 MG 12 hr tablet Commonly known as:  MS CONTIN Take 1 tablet (30 mg total) by mouth every 12 (twelve) hours.   nicotine 7 mg/24hr patch Commonly known as:  NICODERM CQ - dosed in mg/24 hr Place 1 patch (7 mg total) onto the skin daily.   oxyCODONE-acetaminophen 10-325 MG tablet Commonly known as:  PERCOCET Take 1 tablet by mouth every 6 (six) hours as needed for pain. Hold for sedation   polyethylene glycol packet Commonly known as:  MIRALAX / GLYCOLAX Take 17 g by mouth daily.   predniSONE 20 MG tablet Commonly known as:  DELTASONE 40mg  daily for 2 days, then 30mg  daily for 3 days, then 20mg  daily for 3 days, then 10mg  daily for 4 days.   promethazine 25 MG tablet Commonly known as:  PHENERGAN Take 12.5 mg by mouth every 8 (eight) hours as needed for nausea/vomiting.   sennosides-docusate sodium 8.6-50 MG tablet Commonly known as:  SENOKOT-S Take 2 tablets by mouth at bedtime.   vitamin B-12 1000 MCG tablet Commonly known as:  CYANOCOBALAMIN Take 4,000 mcg by mouth daily.       Review of Systems  Constitutional: Negative for activity change, appetite change, chills, fatigue and fever.  HENT: Negative for congestion, rhinorrhea, sinus pain, sinus pressure, sneezing and sore throat.   Eyes: Negative.   Respiratory: Positive for cough. Negative for  chest tightness, shortness of breath and wheezing.   Cardiovascular: Negative for chest pain, palpitations and leg swelling.  Gastrointestinal: Negative for abdominal distention, abdominal pain, constipation, diarrhea, nausea and vomiting.  Genitourinary: Negative for dysuria, flank pain, frequency and urgency.  Musculoskeletal: Positive for gait problem.       Chronic neck and lower back pain managed by pain specialist   Skin: Negative for color change, pallor, rash and wound.  Neurological: Negative for dizziness, seizures, syncope, Macauley-headedness and headaches.  Hematological: Does not bruise/bleed easily.  Psychiatric/Behavioral: Negative for agitation, confusion, hallucinations and sleep disturbance. The patient is not nervous/anxious.     Vitals:   05/04/17 1412  BP: 140/87  Pulse: 74  Resp: 16  Temp: 97.8 F (36.6 C)  SpO2: 97%  Weight: 208 lb (94.3 kg)  Height: 5\' 7"  (1.702 m)   Body mass index is 32.58 kg/m. Physical Exam  Constitutional: She is oriented to person, place, and time. She appears well-developed and well-nourished. No distress.  HENT:  Head: Normocephalic.  Mouth/Throat: Oropharynx is clear and moist. No oropharyngeal exudate.  Eyes: Conjunctivae and EOM are normal. Pupils are equal, round, and reactive to Jakob. Right eye exhibits no discharge. Left eye exhibits no discharge. No scleral icterus.  Neck: Normal range of motion. No JVD present. No thyromegaly present.  Cardiovascular: Normal rate, regular rhythm and intact distal pulses.  Exam reveals no gallop and no friction rub.   No murmur heard. Pulmonary/Chest: Effort normal. No respiratory distress. She has no wheezes.  Bilateral rales noted on Auscultation.   Abdominal: Soft. Bowel sounds are normal.  She exhibits no distension. There is no tenderness. There is no rebound and no guarding.  Genitourinary:  Genitourinary Comments: Continent   Musculoskeletal: She exhibits no edema, tenderness or  deformity.  Unsteady gait. Uses Rollator  Lymphadenopathy:    She has no cervical adenopathy.  Neurological: She is oriented to person, place, and time.  Skin: Skin is warm and dry. No rash noted. No erythema. No pallor.  Psychiatric: She has a normal mood and affect.   Labs reviewed: Basic Metabolic Panel:  Recent Labs  40/98/11 0411 04/23/17 1308 04/26/17 0904  NA 139 138 140  K 3.1* 4.1 4.3  CL 104 104 102  CO2 21* 25 28  GLUCOSE 291* 145* 154*  BUN 10 14 24*  CREATININE 1.22* 1.10* 0.93  CALCIUM 8.6* 9.2 8.9   Liver Function Tests:  Recent Labs  04/21/17 2332  AST 38  ALT 32  ALKPHOS 71  BILITOT 0.3  PROT 6.6  ALBUMIN 3.9   CBC:  Recent Labs  02/05/17 0738 04/05/17 0852 04/18/17 2115 04/21/17 2332 04/23/17 1308 04/28/17 0311  WBC 11.6* 12.6* 9.5 15.5* 19.0* 13.6*  NEUTROABS 8.4* 11.2* 7.3  --   --   --   HGB 14.8 13.7 13.8 14.8 14.1 14.1  HCT 44.7 40.9 40.5 45.9 43.1 44.0  MCV 89.8 89.1 88.6 91.4 90.9 92.4  PLT 300 315 309 435* 393 325   Cardiac Enzymes:  Recent Labs  04/21/17 2332 04/22/17 0836  TROPONINI <0.03 <0.03    Recent Labs  04/26/17 0755 04/27/17 0812 04/28/17 0825  GLUCAP 137* 89 92    Assessment/Plan:   1. Unsteady gait  Has worked well with PT/ OT. Will discharge home PT/OT to continue with ROM, Exercise, Gait stability and muscle strengthening. She will  Require  DME Rollator to allow her to maintain current level of independence with ADL's. Fall and safety precautions.   2. Chronic obstructive pulmonary disease, unspecified COPD type  Status post hospital admission for exacerbation.currently on tapered prednisone until 05/10/2017.continue on Symbicort, DuoNeb as needed and Mucinex. On continuous oxygen via nasal cannula. Continue on Nicotine patch. Encourage smoking cessation.  HH respiratory Therapist to evalulate for portable oxygen. Continue to follow up with Pulmonology as directed.CBC/diff in 1-2 weeks with PCP.    3. Essential (primary) hypertension B/p stable.continue on losartan 100 mg tablet daily.BMP in 1-2 weeks with PCP.     4. Bipolar affective disorder Stable.continue on Wellbutrin XL 300 mg tablet daily. Monitor for mood changes.    5. Chronic pain syndrome Complains of neck, back pain post multiple surgeries on neck,back and abdomen. Seen by her pain management specialist today Ginger Organ, [facility] pain management. Her MS continue 30 mg twice daily and Percocet were refilled by pain management specialist.Continue on gabapentin and baclofen.      6. Slow transit constipation Current regimen effective.  7. PTSD (post-traumatic stress disorder) Continue on ativan 0.5 mg tablet twice daily as needed and atarax 25 mg Tablet three times daily as needed.   8. Acute Sinusitis  Afebrile.Currently on doxycycline 100 mg Tablet twice daily until 05/09/2017.   Patient is being discharged with the following home health services:   -PT/OT for ROM, exercise, gait stability and muscle strengthening  Patient is being discharged with the following durable medical equipment:    Rollator  to allow  her to maintain current level of independence.  Patient has been advised to f/u with their PCP in 1-2 weeks to for a transitions of  care visit.Social services at their facility was responsible for arranging this appointment.  Pt was provided with adequate prescriptions of noncontrolled medications to reach the scheduled appointment.For controlled substances, a limited supply was provided as appropriate for the individual patient. If the pt normally receives these medications from a pain clinic or has a contract with another physician, these medications should be received from that clinic or physician only).    Future labs/tests needed:  CBC, BMP in 1-2 weeks PCP

## 2017-06-24 ENCOUNTER — Other Ambulatory Visit: Payer: Self-pay | Admitting: Family

## 2017-06-29 ENCOUNTER — Emergency Department (HOSPITAL_COMMUNITY)
Admission: EM | Admit: 2017-06-29 | Discharge: 2017-06-29 | Disposition: A | Discharge status: Home / Self Care | Payer: Medicare Other | Attending: Emergency Medicine | Admitting: Emergency Medicine

## 2017-06-29 ENCOUNTER — Encounter (HOSPITAL_COMMUNITY): Payer: Self-pay | Admitting: Emergency Medicine

## 2017-06-29 DIAGNOSIS — F329 Major depressive disorder, single episode, unspecified: Secondary | ICD-10-CM | POA: Diagnosis not present

## 2017-06-29 DIAGNOSIS — I10 Essential (primary) hypertension: Secondary | ICD-10-CM | POA: Insufficient documentation

## 2017-06-29 DIAGNOSIS — Z79899 Other long term (current) drug therapy: Secondary | ICD-10-CM | POA: Insufficient documentation

## 2017-06-29 DIAGNOSIS — Z7982 Long term (current) use of aspirin: Secondary | ICD-10-CM | POA: Insufficient documentation

## 2017-06-29 DIAGNOSIS — Z9049 Acquired absence of other specified parts of digestive tract: Secondary | ICD-10-CM | POA: Diagnosis not present

## 2017-06-29 DIAGNOSIS — F17210 Nicotine dependence, cigarettes, uncomplicated: Secondary | ICD-10-CM | POA: Diagnosis not present

## 2017-06-29 DIAGNOSIS — F411 Generalized anxiety disorder: Secondary | ICD-10-CM | POA: Insufficient documentation

## 2017-06-29 DIAGNOSIS — Z91040 Latex allergy status: Secondary | ICD-10-CM | POA: Diagnosis not present

## 2017-06-29 DIAGNOSIS — J449 Chronic obstructive pulmonary disease, unspecified: Secondary | ICD-10-CM | POA: Insufficient documentation

## 2017-06-29 DIAGNOSIS — F419 Anxiety disorder, unspecified: Secondary | ICD-10-CM | POA: Diagnosis present

## 2017-06-29 LAB — CBC WITH DIFFERENTIAL/PLATELET
BASOS PCT: 1 %
Basophils Absolute: 0.1 10*3/uL (ref 0.0–0.1)
EOS ABS: 0.4 10*3/uL (ref 0.0–0.7)
EOS PCT: 4 %
HEMATOCRIT: 41.8 % (ref 36.0–46.0)
HEMOGLOBIN: 13.9 g/dL (ref 12.0–15.0)
LYMPHS PCT: 20 %
Lymphs Abs: 2 10*3/uL (ref 0.7–4.0)
MCH: 29.2 pg (ref 26.0–34.0)
MCHC: 33.3 g/dL (ref 30.0–36.0)
MCV: 87.8 fL (ref 78.0–100.0)
Monocytes Absolute: 0.5 10*3/uL (ref 0.1–1.0)
Monocytes Relative: 5 %
Neutro Abs: 7 10*3/uL (ref 1.7–7.7)
Neutrophils Relative %: 70 %
Platelets: 356 10*3/uL (ref 150–400)
RBC: 4.76 MIL/uL (ref 3.87–5.11)
RDW: 13.5 % (ref 11.5–15.5)
WBC: 10.1 10*3/uL (ref 4.0–10.5)

## 2017-06-29 LAB — COMPREHENSIVE METABOLIC PANEL
ALK PHOS: 58 U/L (ref 38–126)
ALT: 31 U/L (ref 14–54)
ANION GAP: 10 (ref 5–15)
AST: 40 U/L (ref 15–41)
Albumin: 3.5 g/dL (ref 3.5–5.0)
BILIRUBIN TOTAL: 0.6 mg/dL (ref 0.3–1.2)
BUN: 8 mg/dL (ref 6–20)
CALCIUM: 8.8 mg/dL — AB (ref 8.9–10.3)
CO2: 23 mmol/L (ref 22–32)
Chloride: 104 mmol/L (ref 101–111)
Creatinine, Ser: 1.17 mg/dL — ABNORMAL HIGH (ref 0.44–1.00)
GFR, EST NON AFRICAN AMERICAN: 52 mL/min — AB (ref >60)
Glucose, Bld: 115 mg/dL — ABNORMAL HIGH (ref 65–99)
POTASSIUM: 4.3 mmol/L (ref 3.5–5.1)
Sodium: 137 mmol/L (ref 135–145)
TOTAL PROTEIN: 6 g/dL — AB (ref 6.5–8.1)

## 2017-06-29 MED ORDER — LORAZEPAM 1 MG PO TABS: 0.5000 mg | ORAL_TABLET | Freq: Two times a day (BID) | ORAL | Status: DC | PRN

## 2017-06-29 MED ORDER — HYDROXYZINE HCL 25 MG PO TABS: 25.0000 mg | ORAL_TABLET | Freq: Three times a day (TID) | ORAL | Status: DC | PRN

## 2017-06-29 MED ORDER — LORAZEPAM 2 MG/ML IJ SOLN
2.0000 mg | Freq: Once | INTRAMUSCULAR | Status: AC
  Administered 2017-06-29: 2 mg via INTRAMUSCULAR
  Filled 2017-06-29: qty 1

## 2017-06-29 MED ORDER — ACETAMINOPHEN 325 MG PO TABS: 650.0000 mg | ORAL_TABLET | ORAL | Status: DC | PRN

## 2017-06-29 MED ORDER — BISACODYL 10 MG RE SUPP: 10.0000 mg | Freq: Every day | RECTAL | Status: DC | PRN

## 2017-06-29 MED ORDER — OXYCODONE-ACETAMINOPHEN 5-325 MG PO TABS: 1.0000 | ORAL_TABLET | Freq: Four times a day (QID) | ORAL | Status: DC | PRN

## 2017-06-29 MED ORDER — PROMETHAZINE HCL 25 MG PO TABS: 12.5000 mg | ORAL_TABLET | Freq: Three times a day (TID) | ORAL | Status: DC | PRN

## 2017-06-29 MED ORDER — LORATADINE 10 MG PO TABS
10.0000 mg | ORAL_TABLET | Freq: Every day | ORAL | Status: DC
  Administered 2017-06-29: 10 mg via ORAL
  Filled 2017-06-29: qty 1

## 2017-06-29 MED ORDER — CITALOPRAM HYDROBROMIDE 20 MG PO TABS: 20.0000 mg | ORAL_TABLET | Freq: Every day | ORAL | 0 refills | Status: DC

## 2017-06-29 MED ORDER — BUSPIRONE HCL 7.5 MG PO TABS: 7.5000 mg | ORAL_TABLET | Freq: Two times a day (BID) | ORAL | 0 refills | Status: DC

## 2017-06-29 MED ORDER — BACLOFEN 5 MG HALF TABLET
5.0000 mg | ORAL_TABLET | Freq: Three times a day (TID) | ORAL | Status: DC
  Filled 2017-06-29 (×2): qty 1

## 2017-06-29 MED ORDER — GABAPENTIN 100 MG PO CAPS: 200.0000 mg | ORAL_CAPSULE | Freq: Three times a day (TID) | ORAL | 0 refills | Status: DC | PRN

## 2017-06-29 MED ORDER — BUPROPION HCL ER (XL) 150 MG PO TB24
300.0000 mg | ORAL_TABLET | Freq: Every day | ORAL | Status: DC
  Administered 2017-06-29: 300 mg via ORAL
  Filled 2017-06-29: qty 2

## 2017-06-29 MED ORDER — OXYCODONE-ACETAMINOPHEN 10-325 MG PO TABS: 1.0000 | ORAL_TABLET | Freq: Four times a day (QID) | ORAL | Status: DC | PRN

## 2017-06-29 MED ORDER — OXYCODONE HCL 5 MG PO TABS: 5.0000 mg | ORAL_TABLET | Freq: Four times a day (QID) | ORAL | Status: DC | PRN

## 2017-06-29 MED ORDER — LORAZEPAM 2 MG/ML IJ SOLN
1.0000 mg | Freq: Once | INTRAMUSCULAR | Status: DC
Start: 2017-06-29 — End: 2017-06-29

## 2017-06-29 NOTE — [facility] Counselor (Signed)
Pt does not meet inpatient criteria and is recommended for intensive outpatient services per Elta GuadeloupeLaurie Parks NP. Writer set up appointment for 07/05/17 at 8:45a [facility] and faxed over instructions to RN. EDP notifed and he has requested medication recommendations. Elta GuadeloupeLaurie Parks NP was notified of this request and will be making recommendations shortly.  [address]opl[address] StreetKristin Mairany Bruno TriadelphiaLPC, LCAS

## 2017-06-29 NOTE — Discharge Instructions (Signed)
We saw you in the ER for your anxiety concerns and had our behavioral health team evaluate you. The team feels comfortable sending you home, please follow the recommendations given to you by them and the follow-up and medications they have prescribed to you. Please refrain from substance abuse. Return to the ER if your symptoms worsen.

## 2017-06-29 NOTE — Consult Note (Signed)
Pt has been [facility] for severe anxiety and panic attacks. Pt's appointment is on July 05, 2017. EDP asked for medication recommendations to bridge pt over to IOP services.   The following recommendations will be made:  Celexa 20 mg PO QD for depression/anxiety Gabapentin 200 mg PO TID for anxiety Buspar 7.5 mg PO BID for panic/anxiety    Laveda AbbeLaurie Britton Leilana Mcquire, FNP-C 06/29/2017   1440

## 2017-06-29 NOTE — ED Provider Notes (Signed)
[facility]-EM[facility]ENCY DEPT Provider Note   CSN: [id number] Arrival date & time: 06/29/17  1125     History   Chief Complaint Chief Complaint  Patient presents with  . Panic Attack  . Shortness of Breath    HPI Deanna Yang is a 56 y.o. female.  HPI Pt comes in with cc of anxiety. Pt has hx of COPD, HTN, HL. She reports hx of anxiety disorder, and she has required admission for it. Pt was recently admitted to the hospital for medical reasons, discharged to SNF and went home from there. At home she is now alone and gets easily anxious. She is taking the regular ativan and hydroxyzine. Pt denies any drug use. She has called outpatient psychiatrist and [facility] - but no one has been able to schedule an appointment for her. Pt has no si/hi.   Past Medical History:  Diagnosis Date  . Anxiety   . COPD (chronic obstructive pulmonary disease) (HCC)   . Depression   . Fibromyalgia   . Hypertension   . Hypertension   . Manic depression (HCC)   . Neuropathy   . OSA (obstructive sleep apnea)     Patient Active Problem List   Diagnosis Date Noted  . Vocal cord dysfunction 04/25/2017  . PTSD (post-traumatic stress disorder) 04/25/2017  . 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 Acute respiratory failure with hypoxia (HCC) 04/22/2017  . Hypokalemia 04/22/2017  . Leukocytosis 04/22/2017  . Prolonged QT interval 04/22/2017  . CFIDS (chronic fatigue and immune dysfunction syndrome) (HCC) 08/20/2015  . Foot pain 08/20/2015  . Essential (primary) hypertension 08/20/2015  . COPD with acute exacerbation (HCC) 08/20/2015  . Obstructive apnea 08/20/2015  . Cervical osteoarthritis 08/20/2015  . Sleep disorder 08/20/2015  . Other specified postprocedural states 07/17/2015  . Borderline personality disorder 06/11/2015    Past Surgical History:  Procedure Laterality Date  . ABDOMINAL SURGERY    . APPENDECTOMY    . BACK SURGERY    . BREAST REDUCTION SURGERY    . BREAST SURGERY    .  CARPAL TUNNEL RELEASE Bilateral   . CESAREAN SECTION    . CHOLECYSTECTOMY    . HERNIA REPAIR    . JOINT REPLACEMENT    . KNEE SURGERY    . TONSILLECTOMY      OB History    No data available       Home Medications    Prior to Admission medications   Medication Sig Start Date End Date Taking? Authorizing Provider  aspirin 81 MG chewable tablet Chew 162 mg by mouth daily.   Yes [provider]  baclofen (LIORESAL) 10 MG tablet Take 5 mg by mouth 3 (three) times daily. 03/29/17  Yes [provider]  bisacodyl (DULCOLAX) 10 MG suppository Place 10 mg rectally daily as needed for moderate constipation.   Yes [provider]  budesonide-formoterol (SYMBICORT) 160-4.5 MCG/ACT inhaler Inhale 2 puffs into the lungs 2 (two) times daily.   Yes [provider]  buPROPion (WELLBUTRIN XL) 300 MG 24 hr tablet Take 1 tablet (300 mg total) by mouth daily. 04/29/17  Yes Narda BondsNettey, Ralph A, MD  cetirizine (ZYRTEC) 10 MG tablet Take 10 mg by mouth daily.   Yes [provider]  cholecalciferol (VITAMIN D) 1000 units tablet Take 2,000 Units by mouth daily.   Yes [provider]  DUAVEE 0.45-20 MG TABS Take 1 tablet by mouth daily. 05/24/17  Yes [provider]  hydrOXYzine (ATARAX/VISTARIL) 25 MG tablet Take 25  mg by mouth 3 (three) times daily as needed for anxiety.    Yes [provider]  ibuprofen (ADVIL,MOTRIN) 800 MG tablet Take 800 mg by mouth every 8 (eight) hours as needed for cramping.   Yes [provider]  Immune Globulin, Human, (GAMMAGARD IV) Inject 10 % into the vein every 30 (thirty) days.   Yes [provider]  ipratropium-albuterol (DUONEB) 0.5-2.5 (3) MG/3ML SOLN Inhale 3 mLs into the lungs every 4 (four) hours as needed for wheezing or shortness of breath. 04/15/17  Yes [provider]  LORazepam (ATIVAN) 0.5 MG tablet Take 1 tablet (0.5 mg total) by mouth 2 (two) times daily as needed for  anxiety. Patient taking differently: Take 0.5 mg by mouth 2 (two) times daily as needed for anxiety (panic attack).  04/28/17 04/28/18 Yes Narda Bonds, MD  losartan (COZAAR) 100 MG tablet Take 100 mg by mouth daily.   Yes [provider]  morphine (MS CONTIN) 30 MG 12 hr tablet Take 1 tablet (30 mg total) by mouth every 12 (twelve) hours. 04/28/17  Yes Narda Bonds, MD  NYSTATIN powder Apply 1 application topically 2 (two) times daily as needed. Under the breast 05/23/17  Yes [provider]  oxybutynin (DITROPAN-XL) 10 MG 24 hr tablet Take 10 mg by mouth daily. 06/24/17  Yes [provider]  oxyCODONE-acetaminophen (PERCOCET) 10-325 MG tablet Take 1 tablet by mouth every 6 (six) hours as needed for pain. Hold for sedation   Yes [provider]  promethazine (PHENERGAN) 25 MG tablet Take 25 mg by mouth every 8 (eight) hours as needed for nausea/vomiting.  04/15/17  Yes [provider]  sennosides-docusate sodium (SENOKOT-S) 8.6-50 MG tablet Take 1 tablet by mouth at bedtime.    Yes [provider]  vitamin B-12 (CYANOCOBALAMIN) 1000 MCG tablet Take 4,000 mcg by mouth daily.   Yes [provider]  benzonatate (TESSALON) 100 MG capsule Take 1 capsule (100 mg total) by mouth 3 (three) times daily as needed for cough. Patient not taking: Reported on 06/29/2017 04/28/17   Narda Bonds, MD  busPIRone (BUSPAR) 7.5 MG tablet Take 1 tablet (7.5 mg total) by mouth 2 (two) times daily. 06/29/17   Derwood Kaplan, MD  citalopram (CELEXA) 20 MG tablet Take 1 tablet (20 mg total) by mouth daily. 06/29/17   Derwood Kaplan, MD  dextromethorphan-guaiFENesin (MUCINEX DM) 30-600 MG 12hr tablet Take 1 tablet by mouth 2 (two) times daily. Patient not taking: Reported on 06/29/2017 04/28/17   Narda Bonds, MD  gabapentin (NEURONTIN) 100 MG capsule Take 2 capsules (200 mg total) by mouth 3 (three) times daily as needed. 06/29/17   Derwood Kaplan, MD  nicotine  (NICODERM CQ - DOSED IN MG/24 HR) 7 mg/24hr patch Place 1 patch (7 mg total) onto the skin daily. Patient not taking: Reported on 06/29/2017 04/29/17   Narda Bonds, MD  polyethylene glycol [facility] / GLYCOLAX) packet Take 17 g by mouth daily. Patient taking differently: Take 17 g by mouth daily as needed for mild constipation.  04/28/17   Narda Bonds, MD    Family History Family History  Problem Relation Age of Onset  . Alcohol abuse Mother   . Alcohol abuse Father   . Depression Sister   . Dementia Neg Hx     Social History Social History  Substance Use Topics  . Smoking status: Current Every Day Smoker    Packs/day: 1.00    Types: Cigarettes  .  Smokeless tobacco: Never Used  . Alcohol use No     Allergies   Ambien [zolpidem tartrate]; Beta adrenergic blockers; Fish allergy; Iodinated diagnostic agents; Latex; Metoprolol; Penicillins; Red dye; Shellfish allergy; Sulfa antibiotics; Amoxicillin-pot clavulanate; Aspirin; Dye fdc red  [amaranth (fd&c red #2)]; Other; Tape; Levofloxacin; and Seroquel [quetiapine fumarate]   Review of Systems Review of Systems  Constitutional: Positive for activity change.  Respiratory: Negative for shortness of breath.   Cardiovascular: Negative for chest pain.  Psychiatric/Behavioral: The patient is nervous/anxious.      Physical Exam Updated Vital Signs BP 122/88   Pulse 70   Resp 14   Ht 5' 6.5" (1.689 m)   Wt 96.2 kg (212 lb)   SpO2 95%   BMI 33.71 kg/m   Physical Exam  Constitutional: She is oriented to person, place, and time. She appears well-developed.  HENT:  Head: Normocephalic and atraumatic.  Eyes: EOM are normal.  Neck: Normal range of motion. Neck supple.  Cardiovascular:  tachycardia  Pulmonary/Chest: Effort normal.  Abdominal: Bowel sounds are normal.  Neurological: She is alert and oriented to person, place, and time.  Skin: Skin is warm and dry.  Nursing note and vitals reviewed.    ED Treatments /  Results  Labs (all labs ordered are listed, but only abnormal results are displayed) Labs Reviewed  COMPREHENSIVE METABOLIC PANEL - Abnormal; Notable for the following:       Result Value   Glucose, Bld 115 (*)    Creatinine, Ser 1.17 (*)    Calcium 8.8 (*)    Total Protein 6.0 (*)    GFR calc non Af Amer 52 (*)    All other components within normal limits  CBC WITH DIFFERENTIAL/PLATELET  RAPID URINE DRUG SCREEN, HOSP PERFORMED    EKG  EKG Interpretation None      Date: 06/29/2017  Rate: 78  Rhythm: normal sinus rhythm  QRS Axis: normal  Intervals: normal  ST/T Wave abnormalities: nonspecific changes  Conduction Disutrbances: none  Narrative Interpretation: unremarkable    Radiology No results found.  Procedures Procedures (including critical care time)  Medications Ordered in ED Medications  acetaminophen (TYLENOL) tablet 650 mg (not administered)  baclofen (LIORESAL) tablet 5 mg (not administered)  buPROPion (WELLBUTRIN XL) 24 hr tablet 300 mg (300 mg Oral Given 06/29/17 1406)  bisacodyl (DULCOLAX) suppository 10 mg (not administered)  loratadine (CLARITIN) tablet 10 mg (10 mg Oral Given 06/29/17 1357)  hydrOXYzine (ATARAX/VISTARIL) tablet 25 mg (not administered)  LORazepam (ATIVAN) tablet 0.5 mg (not administered)  promethazine (PHENERGAN) tablet 12.5 mg (not administered)  oxyCODONE-acetaminophen (PERCOCET/ROXICET) 5-325 MG per tablet 1 tablet (not administered)    And  oxyCODONE (Oxy IR/ROXICODONE) immediate release tablet 5 mg (not administered)  LORazepam (ATIVAN) injection 1 mg (0 mg Intravenous Refused 06/29/17 1542)  LORazepam (ATIVAN) injection 2 mg (2 mg Intramuscular Given 06/29/17 1356)     Initial Impression / Assessment and Plan / ED Course  I have reviewed the triage vital signs and the nursing notes.  Pertinent labs & imaging results that were available during my care of the patient were reviewed by me and considered in my medical decision  making (see chart for details).     Pt comes in with cc of anxiety. Pt appears to be having a nervous breakdown, but she is not totally decompensated. It worries me that she has no f/u despite her attempts, and so TTS consulted for med recommendations. They recommend intensive outpatient therapy and have made  some recs on meds.  Pt ok with the plan.  She has QTc prolongation hx, but currently her ekg is normal. Strict ER return precautions have been discussed, and patient is agreeing with the plan and is comfortable with the workup done and the recommendations from the ER.   Final Clinical Impressions(s) / ED Diagnoses   Final diagnoses:  Generalized anxiety disorder    New Prescriptions New Prescriptions   BUSPIRONE (BUSPAR) 7.5 MG TABLET    Take 1 tablet (7.5 mg total) by mouth 2 (two) times daily.   CITALOPRAM (CELEXA) 20 MG TABLET    Take 1 tablet (20 mg total) by mouth daily.   GABAPENTIN (NEURONTIN) 100 MG CAPSULE    Take 2 capsules (200 mg total) by mouth 3 (three) times daily as needed.     Derwood Kaplan, MD 06/29/17 [phone]

## 2017-06-29 NOTE — ED Notes (Signed)
TTS eval being done at bedside 
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
## 2017-07-06 ENCOUNTER — Encounter (HOSPITAL_COMMUNITY): Payer: Self-pay | Admitting: Psychiatry

## 2017-07-06 ENCOUNTER — Ambulatory Visit (INDEPENDENT_AMBULATORY_CARE_PROVIDER_SITE_OTHER): Payer: 59 | Admitting: Psychiatry

## 2017-07-06 VITALS — BP 136/82 | HR 108 | Resp 18 | Ht 64.0 in | Wt 209.8 lb

## 2017-07-06 DIAGNOSIS — G8929 Other chronic pain: Secondary | ICD-10-CM

## 2017-07-06 DIAGNOSIS — F411 Generalized anxiety disorder: Secondary | ICD-10-CM

## 2017-07-06 DIAGNOSIS — Z811 Family history of alcohol abuse and dependence: Secondary | ICD-10-CM

## 2017-07-06 DIAGNOSIS — Z818 Family history of other mental and behavioral disorders: Secondary | ICD-10-CM

## 2017-07-06 DIAGNOSIS — F4310 Post-traumatic stress disorder, unspecified: Secondary | ICD-10-CM

## 2017-07-06 DIAGNOSIS — F17210 Nicotine dependence, cigarettes, uncomplicated: Secondary | ICD-10-CM

## 2017-07-06 MED ORDER — BUSPIRONE HCL 7.5 MG PO TABS: 7.5000 mg | ORAL_TABLET | Freq: Two times a day (BID) | ORAL | 0 refills | Status: DC

## 2017-07-06 MED ORDER — CITALOPRAM HYDROBROMIDE 20 MG PO TABS: 20.0000 mg | ORAL_TABLET | Freq: Every day | ORAL | 0 refills | Status: DC
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
## 2017-07-24 ENCOUNTER — Emergency Department (HOSPITAL_COMMUNITY)
Admission: EM | Admit: 2017-07-24 | Discharge: 2017-07-24 | Disposition: A | Discharge status: Home / Self Care | Payer: Medicare Other | Attending: Emergency Medicine | Admitting: Emergency Medicine

## 2017-07-24 ENCOUNTER — Emergency Department (HOSPITAL_COMMUNITY): Payer: Medicare Other

## 2017-07-24 ENCOUNTER — Encounter (HOSPITAL_COMMUNITY): Payer: Self-pay | Admitting: Emergency Medicine

## 2017-07-24 DIAGNOSIS — F419 Anxiety disorder, unspecified: Secondary | ICD-10-CM

## 2017-07-24 DIAGNOSIS — Z79899 Other long term (current) drug therapy: Secondary | ICD-10-CM | POA: Diagnosis not present

## 2017-07-24 DIAGNOSIS — J449 Chronic obstructive pulmonary disease, unspecified: Secondary | ICD-10-CM | POA: Insufficient documentation

## 2017-07-24 DIAGNOSIS — R197 Diarrhea, unspecified: Secondary | ICD-10-CM | POA: Insufficient documentation

## 2017-07-24 DIAGNOSIS — R112 Nausea with vomiting, unspecified: Secondary | ICD-10-CM

## 2017-07-24 DIAGNOSIS — R109 Unspecified abdominal pain: Secondary | ICD-10-CM | POA: Diagnosis not present

## 2017-07-24 DIAGNOSIS — Z880 Allergy status to penicillin: Secondary | ICD-10-CM | POA: Diagnosis not present

## 2017-07-24 DIAGNOSIS — I10 Essential (primary) hypertension: Secondary | ICD-10-CM | POA: Diagnosis not present

## 2017-07-24 DIAGNOSIS — Z91013 Allergy to seafood: Secondary | ICD-10-CM | POA: Insufficient documentation

## 2017-07-24 DIAGNOSIS — F17210 Nicotine dependence, cigarettes, uncomplicated: Secondary | ICD-10-CM | POA: Insufficient documentation

## 2017-07-24 DIAGNOSIS — Z7982 Long term (current) use of aspirin: Secondary | ICD-10-CM | POA: Diagnosis not present

## 2017-07-24 LAB — COMPREHENSIVE METABOLIC PANEL
ALBUMIN: 3.7 g/dL (ref 3.5–5.0)
ALK PHOS: 67 U/L (ref 38–126)
ALT: 49 U/L (ref 14–54)
AST: 50 U/L — AB (ref 15–41)
Anion gap: 8 (ref 5–15)
BILIRUBIN TOTAL: 0.9 mg/dL (ref 0.3–1.2)
BUN: 5 mg/dL — AB (ref 6–20)
CALCIUM: 9.3 mg/dL (ref 8.9–10.3)
CO2: 23 mmol/L (ref 22–32)
Chloride: 109 mmol/L (ref 101–111)
Creatinine, Ser: 0.96 mg/dL (ref 0.44–1.00)
GFR calc Af Amer: >60 mL/min (ref >60)
GLUCOSE: 144 mg/dL — AB (ref 65–99)
POTASSIUM: 4.2 mmol/L (ref 3.5–5.1)
SODIUM: 140 mmol/L (ref 135–145)
TOTAL PROTEIN: 6.6 g/dL (ref 6.5–8.1)

## 2017-07-24 LAB — CBC
HEMATOCRIT: 43.1 % (ref 36.0–46.0)
Hemoglobin: 14.6 g/dL (ref 12.0–15.0)
MCH: 28.9 pg (ref 26.0–34.0)
MCHC: 33.9 g/dL (ref 30.0–36.0)
MCV: 85.3 fL (ref 78.0–100.0)
PLATELETS: 390 10*3/uL (ref 150–400)
RBC: 5.05 MIL/uL (ref 3.87–5.11)
RDW: 13.6 % (ref 11.5–15.5)
WBC: 7.9 10*3/uL (ref 4.0–10.5)

## 2017-07-24 LAB — URINALYSIS, ROUTINE W REFLEX MICROSCOPIC
BILIRUBIN URINE: NEGATIVE
Glucose, UA: NEGATIVE mg/dL
Hgb urine dipstick: NEGATIVE
KETONES UR: NEGATIVE mg/dL
LEUKOCYTES UA: NEGATIVE
NITRITE: NEGATIVE
PH: 7 (ref 5.0–8.0)
PROTEIN: NEGATIVE mg/dL
Specific Gravity, Urine: 1.009 (ref 1.005–1.030)

## 2017-07-24 LAB — LIPASE, BLOOD: LIPASE: 32 U/L (ref 11–51)

## 2017-07-24 MED ORDER — OXYBUTYNIN CHLORIDE ER 10 MG PO TB24: 10.0000 mg | ORAL_TABLET | Freq: Every day | ORAL | Status: DC

## 2017-07-24 MED ORDER — GABAPENTIN 100 MG PO CAPS
200.0000 mg | ORAL_CAPSULE | Freq: Once | ORAL | Status: AC
  Administered 2017-07-24: 200 mg via ORAL
  Filled 2017-07-24: qty 2

## 2017-07-24 MED ORDER — DIPHENOXYLATE-ATROPINE 2.5-0.025 MG PO TABS: 2.0000 | ORAL_TABLET | Freq: Four times a day (QID) | ORAL | Status: DC | PRN

## 2017-07-24 MED ORDER — BENZONATATE 100 MG PO CAPS: 100.0000 mg | ORAL_CAPSULE | Freq: Three times a day (TID) | ORAL | Status: DC | PRN

## 2017-07-24 MED ORDER — HYDROXYZINE HCL 25 MG PO TABS: 25.0000 mg | ORAL_TABLET | Freq: Four times a day (QID) | ORAL | 0 refills | Status: DC | PRN

## 2017-07-24 MED ORDER — LOSARTAN POTASSIUM 50 MG PO TABS: 100.0000 mg | ORAL_TABLET | Freq: Every day | ORAL | Status: DC

## 2017-07-24 MED ORDER — ONDANSETRON HCL 4 MG/2ML IJ SOLN
4.0000 mg | Freq: Once | INTRAMUSCULAR | Status: AC | PRN
  Administered 2017-07-24: 4 mg via INTRAVENOUS
  Filled 2017-07-24: qty 2

## 2017-07-24 MED ORDER — BUSPIRONE HCL 15 MG PO TABS: 7.5000 mg | ORAL_TABLET | Freq: Two times a day (BID) | ORAL | Status: DC

## 2017-07-24 MED ORDER — LORAZEPAM 1 MG PO TABS: 1.0000 mg | ORAL_TABLET | Freq: Three times a day (TID) | ORAL | 0 refills | Status: DC | PRN

## 2017-07-24 MED ORDER — SODIUM CHLORIDE 0.9 % IV BOLUS (SEPSIS)
1000.0000 mL | Freq: Once | INTRAVENOUS | Status: AC
  Administered 2017-07-24: 1000 mL via INTRAVENOUS

## 2017-07-24 MED ORDER — DIPHENHYDRAMINE HCL 50 MG/ML IJ SOLN
25.0000 mg | Freq: Once | INTRAMUSCULAR | Status: AC
  Administered 2017-07-24: 25 mg via INTRAVENOUS
  Filled 2017-07-24: qty 1

## 2017-07-24 MED ORDER — GABAPENTIN 100 MG PO CAPS: 200.0000 mg | ORAL_CAPSULE | Freq: Three times a day (TID) | ORAL | 0 refills | Status: DC

## 2017-07-24 MED ORDER — METOCLOPRAMIDE HCL 5 MG/ML IJ SOLN
10.0000 mg | Freq: Once | INTRAMUSCULAR | Status: AC
  Administered 2017-07-24: 10 mg via INTRAVENOUS
  Filled 2017-07-24: qty 2

## 2017-07-24 MED ORDER — LORAZEPAM 1 MG PO TABS: 1.0000 mg | ORAL_TABLET | Freq: Three times a day (TID) | ORAL | Status: DC | PRN

## 2017-07-24 MED ORDER — DICYCLOMINE HCL 10 MG PO CAPS: 20.0000 mg | ORAL_CAPSULE | Freq: Three times a day (TID) | ORAL | Status: DC

## 2017-07-24 MED ORDER — LORATADINE 10 MG PO TABS: 10.0000 mg | ORAL_TABLET | Freq: Every day | ORAL | Status: DC

## 2017-07-24 MED ORDER — BUPROPION HCL ER (XL) 150 MG PO TB24: 300.0000 mg | ORAL_TABLET | Freq: Every day | ORAL | Status: DC

## 2017-07-24 MED ORDER — GABAPENTIN 100 MG PO CAPS: 200.0000 mg | ORAL_CAPSULE | Freq: Three times a day (TID) | ORAL | Status: DC

## 2017-07-24 MED ORDER — VITAMIN B-12 1000 MCG PO TABS: 4000.0000 ug | ORAL_TABLET | Freq: Every day | ORAL | Status: DC

## 2017-07-24 MED ORDER — LORAZEPAM 2 MG/ML IJ SOLN
2.0000 mg | Freq: Once | INTRAMUSCULAR | Status: AC
  Administered 2017-07-24: 2 mg via INTRAVENOUS
  Filled 2017-07-24: qty 1

## 2017-07-24 MED ORDER — DICYCLOMINE HCL 20 MG PO TABS: 20.0000 mg | ORAL_TABLET | Freq: Three times a day (TID) | ORAL | 0 refills | Status: DC

## 2017-07-24 MED ORDER — CITALOPRAM HYDROBROMIDE 10 MG PO TABS: 20.0000 mg | ORAL_TABLET | Freq: Every day | ORAL | Status: DC

## 2017-07-24 MED ORDER — MOMETASONE FURO-FORMOTEROL FUM 200-5 MCG/ACT IN AERO: 2.0000 | INHALATION_SPRAY | Freq: Two times a day (BID) | RESPIRATORY_TRACT | Status: DC

## 2017-07-24 MED ORDER — ASPIRIN 81 MG PO CHEW: 162.0000 mg | CHEWABLE_TABLET | Freq: Every day | ORAL | Status: DC

## 2017-07-24 MED ORDER — BACLOFEN 5 MG HALF TABLET: 5.0000 mg | ORAL_TABLET | Freq: Three times a day (TID) | ORAL | Status: DC

## 2017-07-24 NOTE — ED Notes (Signed)
Pt returned from [facility]

## 2017-07-24 NOTE — ED Notes (Signed)
TTS at the bedside. 

## 2017-07-24 NOTE — ED Triage Notes (Signed)
Pt arrives via EMS from home with nausea, vomting and diarrhea. Denies recent fever, sick contacts. Pt reports generalized bodyaches. Reports has been out of gabapentin for 2 days. Pt with hx of fibromyalgia. VSS.

## 2017-07-24 NOTE — ED Provider Notes (Signed)
[facility]-EMERGENCY DEPT Provider Note   CSN: [id number] Arrival date & time: 07/24/17  1327     History   Chief Complaint Chief Complaint  Patient presents with  . Emesis  . Diarrhea    HPI Deanna Yang is a 56 y.o. female.  HPI   56 yo F with h/o HTN, anxiety, fibromyalgia here with anxiety. Pt reports that over the past week, she has had increasing frequency of panic attacks and anxiety. She has tried her home 0.5 of ativan without improvement. She has had associated nausea, vomiting, and intermittent diarrhea. These episodes seem to come on with anxiety and she has had poor appetite due to the nausea. She was previously referred to an IOP program but has not been able to follow-up. Of note, she also has an extensive h/o intra-abdominal surgeries and is concerned she has developed another intra-abdominal abscess, which is complicating her anxiety. Denies any fevers. No urinary sx.  Past Medical History:  Diagnosis Date  . Anxiety   . COPD (chronic obstructive pulmonary disease) (HCC)   . Depression   . Fibromyalgia   . Hypertension   . Hypertension   . Manic depression (HCC)   . Neuropathy   . OSA (obstructive sleep apnea)     Patient Active Problem List   Diagnosis Date Noted  . Vocal cord dysfunction 04/25/2017  . PTSD (post-traumatic stress disorder) 04/25/2017  . 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 Acute respiratory failure with hypoxia (HCC) 04/22/2017  . Hypokalemia 04/22/2017  . Leukocytosis 04/22/2017  . Prolonged QT interval 04/22/2017  . CFIDS (chronic fatigue and immune dysfunction syndrome) (HCC) 08/20/2015  . Foot pain 08/20/2015  . Essential (primary) hypertension 08/20/2015  . COPD with acute exacerbation (HCC) 08/20/2015  . Obstructive apnea 08/20/2015  . Cervical osteoarthritis 08/20/2015  . Sleep disorder 08/20/2015  . Other specified postprocedural states 07/17/2015  . Borderline personality disorder 06/11/2015    Past Surgical  History:  Procedure Laterality Date  . ABDOMINAL SURGERY    . APPENDECTOMY    . BACK SURGERY    . BREAST REDUCTION SURGERY    . BREAST SURGERY    . CARPAL TUNNEL RELEASE Bilateral   . CESAREAN SECTION    . CHOLECYSTECTOMY    . HERNIA REPAIR    . JOINT REPLACEMENT    . KNEE SURGERY    . TONSILLECTOMY      OB History    No data available       Home Medications    Prior to Admission medications   Medication Sig Start Date End Date Taking? Authorizing Provider  aspirin 81 MG chewable tablet Chew 162 mg by mouth daily.    [provider]  baclofen (LIORESAL) 10 MG tablet Take 5 mg by mouth 3 (three) times daily. 03/29/17   [provider]  benzonatate (TESSALON) 100 MG capsule Take 1 capsule (100 mg total) by mouth 3 (three) times daily as needed for cough. 04/28/17   Narda BondsNettey, Ralph A, MD  bisacodyl (DULCOLAX) 10 MG suppository Place 10 mg rectally daily as needed for moderate constipation.    [provider]  budesonide-formoterol (SYMBICORT) 160-4.5 MCG/ACT inhaler Inhale 2 puffs into the lungs 2 (two) times daily.    [provider]  buPROPion (WELLBUTRIN XL) 300 MG 24 hr tablet Take 1 tablet (300 mg total) by mouth daily. 04/29/17   Narda BondsNettey, Ralph A, MD  busPIRone (BUSPAR) 7.5 MG tablet Take 1 tablet (7.5 mg total) by mouth 2 (two) times  daily. 07/06/17   Thresa Ross, MD  cetirizine (ZYRTEC) 10 MG tablet Take 10 mg by mouth daily.    [provider]  cholecalciferol (VITAMIN D) 1000 units tablet Take 2,000 Units by mouth daily.    [provider]  citalopram (CELEXA) 20 MG tablet Take 1 tablet (20 mg total) by mouth daily. 07/06/17   Thresa Ross, MD  dextromethorphan-guaiFENesin [facility] DM) 30-600 MG 12hr tablet Take 1 tablet by mouth 2 (two) times daily. Patient not taking: Reported on 06/29/2017 04/28/17   Narda Bonds, MD  dicyclomine (BENTYL) 20 MG tablet Take 1 tablet (20 mg total) by mouth 4 (four) times daily -  before  meals and at bedtime. 07/24/17 07/29/17  Shaune Pollack, MD  DUAVEE 0.45-20 MG TABS Take 1 tablet by mouth daily. 05/24/17   [provider]  gabapentin (NEURONTIN) 100 MG capsule Take 2 capsules (200 mg total) by mouth 3 (three) times daily. 07/24/17 07/31/17  Shaune Pollack, MD  hydrOXYzine (ATARAX/VISTARIL) 25 MG tablet Take 1 tablet (25 mg total) by mouth every 6 (six) hours as needed for anxiety (mild anxiety). 07/24/17   Shaune Pollack, MD  ibuprofen (ADVIL,MOTRIN) 800 MG tablet Take 800 mg by mouth every 8 (eight) hours as needed for cramping.    [provider]  Immune Globulin, Human, (GAMMAGARD IV) Inject 10 % into the vein every 30 (thirty) days.    [provider]  ipratropium-albuterol (DUONEB) 0.5-2.5 (3) MG/3ML SOLN Inhale 3 mLs into the lungs every 4 (four) hours as needed for wheezing or shortness of breath. 04/15/17   [provider]  LORazepam (ATIVAN) 1 MG tablet Take 1 tablet (1 mg total) by mouth 3 (three) times daily as needed for anxiety (or nausea). 07/24/17   Shaune Pollack, MD  losartan (COZAAR) 100 MG tablet Take 100 mg by mouth daily.    [provider]  morphine (MS CONTIN) 30 MG 12 hr tablet Take 1 tablet (30 mg total) by mouth every 12 (twelve) hours. 04/28/17   Narda Bonds, MD  nicotine (NICODERM CQ - DOSED IN MG/24 HR) 7 mg/24hr patch Place 1 patch (7 mg total) onto the skin daily. Patient not taking: Reported on 06/29/2017 04/29/17   Narda Bonds, MD  NYSTATIN powder Apply 1 application topically 2 (two) times daily as needed. Under the breast 05/23/17   [provider]  oxybutynin (DITROPAN-XL) 10 MG 24 hr tablet Take 10 mg by mouth daily. 06/24/17   [provider]  oxyCODONE-acetaminophen (PERCOCET) 10-325 MG tablet Take 1 tablet by mouth every 6 (six) hours as needed for pain. Hold for sedation    [provider]  polyethylene glycol (MIRALAX / GLYCOLAX) packet Take 17 g by mouth daily. Patient  taking differently: Take 17 g by mouth daily as needed for mild constipation.  04/28/17   Narda Bonds, MD  promethazine (PHENERGAN) 25 MG tablet Take 25 mg by mouth every 8 (eight) hours as needed for nausea/vomiting.  04/15/17   [provider]  sennosides-docusate sodium (SENOKOT-S) 8.6-50 MG tablet Take 1 tablet by mouth at bedtime.     [provider]  vitamin B-12 (CYANOCOBALAMIN) 1000 MCG tablet Take 4,000 mcg by mouth daily.    [provider]    Family History Family History  Problem Relation Age of Onset  . Alcohol abuse Mother   . Alcohol abuse Father   . Depression Sister   . Dementia Neg Hx     Social History  Social History  Substance Use Topics  . Smoking status: Current Every Day Smoker    Packs/day: 1.00    Types: Cigarettes  . Smokeless tobacco: Never Used  . Alcohol use No     Allergies   Ambien [zolpidem tartrate]; Beta adrenergic blockers; Fish allergy; Iodinated diagnostic agents; Latex; Metoprolol; Penicillins; Red dye; Shellfish allergy; Sulfa antibiotics; Amoxicillin-pot clavulanate; Aspirin; Dye fdc red  [amaranth (fd&c red #2)]; Other; Tape; Levofloxacin; and Seroquel [quetiapine fumarate]   Review of Systems Review of Systems  Constitutional: Positive for fatigue. Negative for chills and fever.  HENT: Negative for congestion, rhinorrhea and sore throat.   Eyes: Negative for visual disturbance.  Respiratory: Negative for cough, shortness of breath and wheezing.   Cardiovascular: Negative for chest pain and leg swelling.  Gastrointestinal: Positive for abdominal pain, diarrhea, nausea and vomiting.  Genitourinary: Negative for dysuria, flank pain, vaginal bleeding and vaginal discharge.  Musculoskeletal: Negative for neck pain.  Skin: Negative for rash.  Allergic/Immunologic: Negative for immunocompromised state.  Neurological: Negative for syncope and headaches.  Hematological: Does not bruise/bleed easily.    Psychiatric/Behavioral: Positive for decreased concentration and sleep disturbance. Negative for suicidal ideas. The patient is nervous/anxious.   All other systems reviewed and are negative.    Physical Exam Updated Vital Signs BP 129/88 (BP Location: Right Arm)   Pulse 69   Temp 98.1 F (36.7 C) (Oral)   Resp 16   SpO2 96%   Physical Exam  Constitutional: She is oriented to person, place, and time. She appears well-developed and well-nourished. No distress.  HENT:  Head: Normocephalic and atraumatic.  Eyes: Conjunctivae are normal.  Neck: Neck supple.  Cardiovascular: Normal rate, regular rhythm and normal heart sounds.  Exam reveals no friction rub.   No murmur heard. Pulmonary/Chest: Effort normal and breath sounds normal. No respiratory distress. She has no wheezes. She has no rales.  Abdominal: Soft. Normal appearance. She exhibits no distension. Bowel sounds are increased. There is generalized tenderness. There is no rigidity, no rebound and no guarding.  Musculoskeletal: She exhibits no edema.  Neurological: She is alert and oriented to person, place, and time. She exhibits normal muscle tone.  Skin: Skin is warm. Capillary refill takes less than 2 seconds.  Psychiatric: Her mood appears anxious. She expresses no homicidal and no suicidal ideation.  Nursing note and vitals reviewed.    ED Treatments / Results  Labs (all labs ordered are listed, but only abnormal results are displayed) Labs Reviewed  COMPREHENSIVE METABOLIC PANEL - Abnormal; Notable for the following:       Result Value   Glucose, Bld 144 (*)    BUN 5 (*)    AST 50 (*)    All other components within normal limits  LIPASE, BLOOD  CBC  URINALYSIS, ROUTINE W REFLEX MICROSCOPIC    EKG  EKG Interpretation None       Radiology Ct Abdomen Pelvis Wo Contrast  Result Date: 07/24/2017 CLINICAL DATA:  56 year old female with nausea vomiting diarrhea for 2 days and will body aches for 1 week.  EXAM: CT ABDOMEN AND PELVIS WITHOUT CONTRAST TECHNIQUE: Multidetector CT imaging of the abdomen and pelvis was performed following the standard protocol without IV contrast. COMPARISON:  None. FINDINGS: Lower chest: No acute abnormality. Hepatobiliary: No focal liver abnormality is seen. Status post cholecystectomy. No biliary dilatation. Pancreas: Unremarkable. No pancreatic ductal dilatation or surrounding inflammatory changes. Spleen: Normal in size without focal abnormality. Adrenals/Urinary Tract: Adrenal glands are unremarkable. Kidneys are normal,  without renal calculi, focal lesion, or hydronephrosis. Bladder is unremarkable. Note is made of multiple pelvic phleboliths. Stomach/Bowel: Stomach is within normal limits. Appendix surgically absent. No evidence of bowel wall thickening, distention, or inflammatory changes. Vascular/Lymphatic: No significant vascular findings are present. No enlarged abdominal or pelvic lymph nodes. Reproductive: Note is made of a 2.4 cm calcified fibroid at the uterine fundus. Uterus and bilateral adnexa are otherwise unremarkable. Other: No abdominal wall hernia or abnormality. No abdominopelvic ascites. Musculoskeletal: No acute or significant osseous findings. Discogenic degenerative changes at L5-S1 with endplate sclerosis and vacuum disc phenomenon. IMPRESSION: 1. No acute intra- abdominal findings to explain the patient's clinical symptoms. 2. Postoperative changes consistent with cholecystectomy and appendectomy. 3. Calcified uterine fibroid. 4. Degenerative changes at L5-S1. Electronically Signed   By: Sande Brothers M.D.   On: 07/24/2017 14:55    Procedures Procedures (including critical care time)  Medications Ordered in ED Medications  aspirin chewable tablet 162 mg (not administered)  baclofen (LIORESAL) tablet 5 mg (not administered)  benzonatate (TESSALON) capsule 100 mg (not administered)  mometasone-formoterol (DULERA) 200-5 MCG/ACT inhaler 2 puff (not  administered)  buPROPion (WELLBUTRIN XL) 24 hr tablet 300 mg (not administered)  busPIRone (BUSPAR) tablet 7.5 mg (not administered)  loratadine (CLARITIN) tablet 10 mg (not administered)  citalopram (CELEXA) tablet 20 mg (not administered)  gabapentin (NEURONTIN) capsule 200 mg (not administered)  losartan (COZAAR) tablet 100 mg (not administered)  vitamin B-12 (CYANOCOBALAMIN) tablet 4,000 mcg (not administered)  oxybutynin (DITROPAN-XL) 24 hr tablet 10 mg (not administered)  diphenoxylate-atropine (LOMOTIL) 2.5-0.025 MG per tablet 2 tablet (not administered)  LORazepam (ATIVAN) tablet 1 mg (not administered)  dicyclomine (BENTYL) capsule 20 mg (not administered)  ondansetron (ZOFRAN) injection 4 mg (4 mg Intravenous Given 07/24/17 1421)  sodium chloride 0.9 % bolus 1,000 mL (0 mLs Intravenous Stopped 07/24/17 1521)  LORazepam (ATIVAN) injection 2 mg (2 mg Intravenous Given 07/24/17 1423)  metoCLOPramide (REGLAN) injection 10 mg (10 mg Intravenous Given 07/24/17 1643)  diphenhydrAMINE (BENADRYL) injection 25 mg (25 mg Intravenous Given 07/24/17 1645)  gabapentin (NEURONTIN) capsule 200 mg (200 mg Oral Given 07/24/17 1643)     Initial Impression / Assessment and Plan / ED Course  I have reviewed the triage vital signs and the nursing notes.  Pertinent labs & imaging results that were available during my care of the patient were reviewed by me and considered in my medical decision making (see chart for details).     56 yo F with PMHx severe anxiety here with nausea, vomiting, abdominal pain/cramping. Pt also has worsening anxiety, increasing frequency of panic attacks, and reportedly has not been sleeping.   Regarding her abd pain, suspect this is 2/2 a combination of possible viral GI illness with likely anxiety/IBS. She does have a h/o recurrent intra-abd surgeries so CT obtained which shows no acute abnormalities. Labs are overall reassuring. Pt feels improved after ativan, fluids, and  reglan. Will continue antiemetics at home.   Regarding her anxiety, she does have severe anxiety sx that seem to be impacting her life significantly. She reportedly was referred to an IOP program here but has been unable to contact them. Given the degree of her impairment, will consult TTS re: possible need for inpt treatment and/or IOP. I suspect that if seen by TTS, pt will be safe for outpt follow-up management with a good plan in place.   Final Clinical Impressions(s) / ED Diagnoses   Final diagnoses:  Severe anxiety  Nausea vomiting and diarrhea  New Prescriptions Discharge Medication List as of 07/24/2017  7:01 PM    START taking these medications   Details  dicyclomine (BENTYL) 20 MG tablet Take 1 tablet (20 mg total) by mouth 4 (four) times daily -  before meals and at bedtime., Starting Mon 07/24/2017, Until Sat 07/29/2017, Print         Shaune Pollack, MD 07/24/17 2055

## 2017-07-24 NOTE — ED Provider Notes (Signed)
Case discussed with TTS, who provided follow-up for the patient at the intensive outpatient treatment center, as well as information on a flyer, given to the patient. Patient reportedly had similar suggestions given several weeks ago, but did not follow-up as directed. I discussed these findings and plan with the patient, and she agrees to proceed with IOP.   Mancel BaleWentz, [place] Agent, MD 07/24/17 1900
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
## 2017-07-24 NOTE — ED Notes (Signed)
Pt taken to CT.

## 2017-07-24 NOTE — Discharge Instructions (Addendum)
[facility]  Intensive Outpatient Program- [facility] Call the number below tomorrow between the hours of 8am-5pm to make an appointment  Phone: [phone]  Address: [address] [place], [place], [place] [id number]  Intensive outpatient is from Monday-Friday 9am-12p Please see attached flyer

## 2017-07-24 NOTE — ED Notes (Signed)
Pt speaking to TTS 

## 2017-07-25 ENCOUNTER — Telehealth (HOSPITAL_COMMUNITY): Payer: Self-pay | Admitting: Licensed Clinical Social Worker

## 2017-07-25 ENCOUNTER — Ambulatory Visit (INDEPENDENT_AMBULATORY_CARE_PROVIDER_SITE_OTHER): Payer: 59 | Admitting: Licensed Clinical Social Worker

## 2017-07-25 DIAGNOSIS — G8929 Other chronic pain: Secondary | ICD-10-CM

## 2017-07-25 DIAGNOSIS — F411 Generalized anxiety disorder: Secondary | ICD-10-CM

## 2017-07-25 DIAGNOSIS — F4310 Post-traumatic stress disorder, unspecified: Secondary | ICD-10-CM | POA: Diagnosis not present
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
## 2017-07-25 NOTE — Telephone Encounter (Signed)
Therapist called Jeri Modenaita Clark, to make a referral for IOP. She relates she is put in a call for patient for IOP

## 2017-07-31 ENCOUNTER — Encounter (HOSPITAL_COMMUNITY): Payer: Self-pay | Admitting: Psychiatry

## 2017-07-31 ENCOUNTER — Ambulatory Visit (INDEPENDENT_AMBULATORY_CARE_PROVIDER_SITE_OTHER): Payer: 59 | Admitting: Psychiatry

## 2017-07-31 VITALS — BP 130/80 | HR 81 | Resp 16 | Ht 64.0 in | Wt 207.0 lb

## 2017-07-31 DIAGNOSIS — Z811 Family history of alcohol abuse and dependence: Secondary | ICD-10-CM

## 2017-07-31 DIAGNOSIS — F17210 Nicotine dependence, cigarettes, uncomplicated: Secondary | ICD-10-CM

## 2017-07-31 DIAGNOSIS — F3130 Bipolar disorder, current episode depressed, mild or moderate severity, unspecified: Secondary | ICD-10-CM | POA: Diagnosis not present

## 2017-07-31 DIAGNOSIS — Z818 Family history of other mental and behavioral disorders: Secondary | ICD-10-CM | POA: Diagnosis not present

## 2017-07-31 DIAGNOSIS — F4310 Post-traumatic stress disorder, unspecified: Secondary | ICD-10-CM

## 2017-07-31 DIAGNOSIS — G8929 Other chronic pain: Secondary | ICD-10-CM

## 2017-07-31 DIAGNOSIS — F411 Generalized anxiety disorder: Secondary | ICD-10-CM | POA: Diagnosis not present

## 2017-07-31 MED ORDER — BUSPIRONE HCL 7.5 MG PO TABS: 7.5000 mg | ORAL_TABLET | Freq: Two times a day (BID) | ORAL | 0 refills | Status: DC

## 2017-07-31 MED ORDER — CITALOPRAM HYDROBROMIDE 20 MG PO TABS: 20.0000 mg | ORAL_TABLET | Freq: Every day | ORAL | 0 refills | Status: DC

## 2017-07-31 MED ORDER — GABAPENTIN 100 MG PO CAPS: 200.0000 mg | ORAL_CAPSULE | Freq: Three times a day (TID) | ORAL | 0 refills | Status: DC

## 2017-07-31 MED ORDER — OLANZAPINE 5 MG PO TBDP: 5.0000 mg | ORAL_TABLET | Freq: Every day | ORAL | 0 refills | Status: DC
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
## 2017-08-13 ENCOUNTER — Encounter (HOSPITAL_COMMUNITY): Payer: Self-pay | Admitting: Oncology

## 2017-08-13 ENCOUNTER — Emergency Department (HOSPITAL_COMMUNITY)
Admission: EM | Admit: 2017-08-13 | Discharge: 2017-08-13 | Disposition: A | Discharge status: Home / Self Care | Payer: Medicare Other | Attending: Emergency Medicine | Admitting: Emergency Medicine

## 2017-08-13 ENCOUNTER — Emergency Department (HOSPITAL_COMMUNITY): Payer: Medicare Other

## 2017-08-13 DIAGNOSIS — R0789 Other chest pain: Secondary | ICD-10-CM | POA: Diagnosis not present

## 2017-08-13 DIAGNOSIS — Z79899 Other long term (current) drug therapy: Secondary | ICD-10-CM | POA: Insufficient documentation

## 2017-08-13 DIAGNOSIS — R0602 Shortness of breath: Secondary | ICD-10-CM | POA: Diagnosis not present

## 2017-08-13 DIAGNOSIS — J449 Chronic obstructive pulmonary disease, unspecified: Secondary | ICD-10-CM | POA: Insufficient documentation

## 2017-08-13 DIAGNOSIS — M2550 Pain in unspecified joint: Secondary | ICD-10-CM

## 2017-08-13 DIAGNOSIS — R05 Cough: Secondary | ICD-10-CM | POA: Diagnosis not present

## 2017-08-13 DIAGNOSIS — Z91013 Allergy to seafood: Secondary | ICD-10-CM | POA: Insufficient documentation

## 2017-08-13 DIAGNOSIS — Z880 Allergy status to penicillin: Secondary | ICD-10-CM | POA: Diagnosis not present

## 2017-08-13 DIAGNOSIS — I10 Essential (primary) hypertension: Secondary | ICD-10-CM | POA: Insufficient documentation

## 2017-08-13 DIAGNOSIS — R058 Other specified cough: Secondary | ICD-10-CM

## 2017-08-13 DIAGNOSIS — Z91040 Latex allergy status: Secondary | ICD-10-CM | POA: Insufficient documentation

## 2017-08-13 DIAGNOSIS — F17210 Nicotine dependence, cigarettes, uncomplicated: Secondary | ICD-10-CM | POA: Diagnosis not present

## 2017-08-13 DIAGNOSIS — M797 Fibromyalgia: Secondary | ICD-10-CM | POA: Diagnosis not present

## 2017-08-13 LAB — CBC WITH DIFFERENTIAL/PLATELET
Basophils Absolute: 0.1 10*3/uL (ref 0.0–0.1)
Basophils Relative: 1 %
Eosinophils Absolute: 0.4 10*3/uL (ref 0.0–0.7)
Eosinophils Relative: 4 %
HCT: 34.9 % — ABNORMAL LOW (ref 36.0–46.0)
Hemoglobin: 11.2 g/dL — ABNORMAL LOW (ref 12.0–15.0)
Lymphocytes Relative: 36 %
Lymphs Abs: 3.3 10*3/uL (ref 0.7–4.0)
MCH: 27.9 pg (ref 26.0–34.0)
MCHC: 32.1 g/dL (ref 30.0–36.0)
MCV: 87 fL (ref 78.0–100.0)
Monocytes Absolute: 0.7 10*3/uL (ref 0.1–1.0)
Monocytes Relative: 7 %
Neutro Abs: 4.7 10*3/uL (ref 1.7–7.7)
Neutrophils Relative %: 52 %
Platelets: 398 10*3/uL (ref 150–400)
RBC: 4.01 MIL/uL (ref 3.87–5.11)
RDW: 13.7 % (ref 11.5–15.5)
WBC: 9.1 10*3/uL (ref 4.0–10.5)

## 2017-08-13 LAB — BASIC METABOLIC PANEL
Anion gap: 9 (ref 5–15)
BUN: 16 mg/dL (ref 6–20)
CO2: 23 mmol/L (ref 22–32)
Calcium: 8.7 mg/dL — ABNORMAL LOW (ref 8.9–10.3)
Chloride: 111 mmol/L (ref 101–111)
Creatinine, Ser: 1.14 mg/dL — ABNORMAL HIGH (ref 0.44–1.00)
GFR calc Af Amer: >60 mL/min (ref >60)
GFR calc non Af Amer: 53 mL/min — ABNORMAL LOW (ref >60)
Glucose, Bld: 127 mg/dL — ABNORMAL HIGH (ref 65–99)
Potassium: 3.4 mmol/L — ABNORMAL LOW (ref 3.5–5.1)
Sodium: 143 mmol/L (ref 135–145)

## 2017-08-13 LAB — D-DIMER, QUANTITATIVE (NOT AT ARMC): D-Dimer, Quant: 0.97 ug/mL-FEU — ABNORMAL HIGH (ref 0.00–0.50)

## 2017-08-13 LAB — I-STAT BETA HCG BLOOD, ED (MC, WL, AP ONLY): I-stat hCG, quantitative: <5 m[IU]/mL (ref <5)

## 2017-08-13 LAB — I-STAT TROPONIN, ED: Troponin i, poc: 0 ng/mL (ref 0.00–0.08)

## 2017-08-13 MED ORDER — HYDROCORTISONE NA SUCCINATE PF 250 MG IJ SOLR
200.0000 mg | Freq: Once | INTRAMUSCULAR | Status: AC
  Administered 2017-08-13: 200 mg via INTRAVENOUS
  Filled 2017-08-13: qty 200

## 2017-08-13 MED ORDER — DIPHENHYDRAMINE HCL 25 MG PO CAPS: 50.0000 mg | ORAL_CAPSULE | Freq: Once | ORAL | Status: AC

## 2017-08-13 MED ORDER — DOXYCYCLINE HYCLATE 100 MG PO TABS
100.0000 mg | ORAL_TABLET | Freq: Once | ORAL | Status: AC
  Administered 2017-08-13: 100 mg via ORAL
  Filled 2017-08-13: qty 1

## 2017-08-13 MED ORDER — PREDNISONE 20 MG PO TABS
60.0000 mg | ORAL_TABLET | Freq: Once | ORAL | Status: AC
  Administered 2017-08-13: 60 mg via ORAL
  Filled 2017-08-13: qty 3

## 2017-08-13 MED ORDER — PREDNISONE 10 MG PO TABS: 40.0000 mg | ORAL_TABLET | Freq: Every day | ORAL | 0 refills | Status: AC

## 2017-08-13 MED ORDER — MORPHINE SULFATE (PF) 4 MG/ML IV SOLN
2.0000 mg | Freq: Once | INTRAVENOUS | Status: AC
  Administered 2017-08-13: 2 mg via INTRAVENOUS
  Filled 2017-08-13: qty 1

## 2017-08-13 MED ORDER — DOXYCYCLINE HYCLATE 100 MG PO CAPS: 100.0000 mg | ORAL_CAPSULE | Freq: Two times a day (BID) | ORAL | 0 refills | Status: AC

## 2017-08-13 MED ORDER — IOPAMIDOL (ISOVUE-370) INJECTION 76%
INTRAVENOUS | Status: AC
  Administered 2017-08-13: 80 mL
  Filled 2017-08-13: qty 100

## 2017-08-13 MED ORDER — DIPHENHYDRAMINE HCL 50 MG/ML IJ SOLN
50.0000 mg | Freq: Once | INTRAMUSCULAR | Status: AC
  Administered 2017-08-13: 50 mg via INTRAVENOUS
  Filled 2017-08-13: qty 1

## 2017-08-13 MED ORDER — ALBUTEROL SULFATE (2.5 MG/3ML) 0.083% IN NEBU
5.0000 mg | INHALATION_SOLUTION | Freq: Once | RESPIRATORY_TRACT | Status: AC
  Administered 2017-08-13: 5 mg via RESPIRATORY_TRACT
  Filled 2017-08-13 (×2): qty 6

## 2017-08-13 NOTE — ED Triage Notes (Signed)
Pt bib [facility] from home d/t shob.  Per EMS pt reports that shob began yesterday.  Pt attempted to manage it at home w/o relief.  Pt used rescue inhaler x 4 w/o relief.  EMS reported wheezing w/ exertion.  Pt arrived w/ neb tx going.  Pt does have a hx of respiratory arrest.

## 2017-08-13 NOTE — ED Notes (Signed)
Pt ambulated with walker (Pt stated that she is supposed to have a walker but insurance would not pay for it this year) pt did well in hall and back to room. Pt stated that it hurts to walk. I asked pt how was her walk and was her walking different. She started that her walk was different but it all started when the pain started.

## 2017-08-13 NOTE — ED Provider Notes (Signed)
[facility]-EMERGENCY DEPT Provider Note   CSN: [id number] Arrival date & time: 08/13/17  [id number]     History   Chief Complaint Chief Complaint  Patient presents with  . Shortness of Breath    HPI Deanna Yang is a 56 y.o. female with history of anxiety, COPD, fibromyalgia, HTN, and neuropathy who presents today with chief complaint gradual onset, progressively worsening shortness of breath. She states she has been feeling more short of breath for the past 3 days, and complains of a cough productive of green sputum. She feels that she has been wheezing on the left side of her chest which is abnormal for her. She denies fevers, but states that she feels constantly hot and that her temperature has been running lower than usual (96-68F). She states that she has used her rescue inhaler 3 times without relief, she was given a nebulizer treatment while on route via EMS, and states she does feel better. She does have a history of respiratory arrest in October. Yesterday she developed upper chest pain with radiation up to her neck and down her right arm which she describes as a burning sensation. Pain is constant at this time. Endorses an episode of nausea yesterday but no vomiting. Denies abdominal pain. She also is endorsing right knee swelling and pain for 3 days. Pain worsens on ambulation and with palpation. She states that 4 days ago she fell backwards on her way to the bathroom but did not hit her head or pass out. Denies any new numbness, tingling, or weakness. She denies any recent travel or surgeries, quit smoking 6 months ago, no history of cancer or IV drug use, but does take estrogen therapy. States that she has been trying her usual home medications for her symptoms without significant relief.  The history is provided by the patient.    Past Medical History:  Diagnosis Date  . Anxiety   . COPD (chronic obstructive pulmonary disease) (HCC)   . Depression   . Fibromyalgia   . Hypertension   .  Hypertension   . Manic depression (HCC)   . Neuropathy   . OSA (obstructive sleep apnea)     Patient Active Problem List   Diagnosis Date Noted  . Vocal cord dysfunction 04/25/2017  . PTSD (post-traumatic stress disorder) 04/25/2017  . 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 Acute respiratory failure with hypoxia (HCC) 04/22/2017  . Hypokalemia 04/22/2017  . Leukocytosis 04/22/2017  . Prolonged QT interval 04/22/2017  . CFIDS (chronic fatigue and immune dysfunction syndrome) (HCC) 08/20/2015  . Foot pain 08/20/2015  . Essential (primary) hypertension 08/20/2015  . COPD with acute exacerbation (HCC) 08/20/2015  . Obstructive apnea 08/20/2015  . Cervical osteoarthritis 08/20/2015  . Sleep disorder 08/20/2015  . Other specified postprocedural states 07/17/2015  . Borderline personality disorder 06/11/2015    Past Surgical History:  Procedure Laterality Date  . ABDOMINAL SURGERY    . APPENDECTOMY    . BACK SURGERY    . BREAST REDUCTION SURGERY    . BREAST SURGERY    . CARPAL TUNNEL RELEASE Bilateral   . CESAREAN SECTION    . CHOLECYSTECTOMY    . HERNIA REPAIR    . JOINT REPLACEMENT    . KNEE SURGERY    . TONSILLECTOMY      OB History    No data available       Home Medications    Prior to Admission medications   Medication Sig Start Date End Date Taking? Authorizing Provider  acyclovir ointment (ZOVIRAX) 5 % Apply 1 application topically every 3 (three) hours as needed. 07/12/17  Yes [provider]  albuterol (PROAIR HFA) 108 (90 Base) MCG/ACT inhaler Inhale 2 puffs into the lungs every 4 (four) hours as needed for wheezing. 06/30/17  Yes [provider]  amLODipine (NORVASC) 5 MG tablet Take 5 mg by mouth 2 (two) times daily. 07/05/17  Yes [provider]  baclofen (LIORESAL) 10 MG tablet Take 5 mg by mouth 3 (three) times daily. 03/29/17  Yes [provider]  budesonide-formoterol (SYMBICORT) 160-4.5 MCG/ACT inhaler Inhale 2  puffs into the lungs 2 (two) times daily.   Yes [provider]  buPROPion (WELLBUTRIN XL) 300 MG 24 hr tablet Take 1 tablet (300 mg total) by mouth daily. 04/29/17  Yes Narda Bonds, MD  busPIRone (BUSPAR) 7.5 MG tablet Take 1 tablet (7.5 mg total) by mouth 2 (two) times daily. 07/31/17  Yes Thresa Ross, MD  cetirizine (ZYRTEC) 10 MG tablet Take 10 mg by mouth daily.   Yes [provider]  cholecalciferol (VITAMIN D) 1000 units tablet Take 2,000 Units by mouth daily.   Yes [provider]  citalopram (CELEXA) 20 MG tablet Take 1 tablet (20 mg total) by mouth daily. 07/31/17  Yes Thresa Ross, MD  dicyclomine (BENTYL) 20 MG tablet Take 20 mg by mouth 4 (four) times daily. 07/24/17  Yes [provider]  DUAVEE 0.45-20 MG TABS Take 1 tablet by mouth daily. 05/24/17  Yes [provider]  gabapentin (NEURONTIN) 100 MG capsule Take 2 capsules (200 mg total) by mouth 3 (three) times daily. Patient taking differently: Take 1,800 mg by mouth at bedtime.  07/31/17 08/13/17 Yes Thresa Ross, MD  hydrOXYzine (ATARAX/VISTARIL) 25 MG tablet Take 1 tablet (25 mg total) by mouth every 6 (six) hours as needed for anxiety (mild anxiety). 07/24/17  Yes Shaune Pollack, MD  ibuprofen (ADVIL,MOTRIN) 800 MG tablet Take 800 mg by mouth every 8 (eight) hours as needed for cramping.   Yes [provider]  Immune Globulin, Human, (GAMMAGARD IV) Inject 10 % into the vein every 30 (thirty) days.   Yes [provider]  ipratropium-albuterol (DUONEB) 0.5-2.5 (3) MG/3ML SOLN Inhale 3 mLs into the lungs every 4 (four) hours as needed for wheezing or shortness of breath. 04/15/17  Yes [provider]  LORazepam (ATIVAN) 1 MG tablet Take 1 tablet (1 mg total) by mouth 3 (three) times daily as needed for anxiety (or nausea). 07/24/17  Yes Shaune Pollack, MD  losartan (COZAAR) 100 MG tablet Take 100 mg by mouth daily.   Yes [provider]  morphine (MS  CONTIN) 30 MG 12 hr tablet Take 1 tablet (30 mg total) by mouth every 12 (twelve) hours. 04/28/17  Yes Narda Bonds, MD  NYSTATIN powder Apply 1 application topically 2 (two) times daily as needed. Under the breast 05/23/17  Yes [provider]  OLANZapine zydis (ZYPREXA) 5 MG disintegrating tablet Take 1 tablet (5 mg total) by mouth at bedtime. 07/31/17  Yes Thresa Ross, MD  oxybutynin (DITROPAN-XL) 10 MG 24 hr tablet Take 10 mg by mouth daily. 06/24/17  Yes [provider]  oxyCODONE-acetaminophen (PERCOCET) 10-325 MG tablet Take 1 tablet by mouth every 6 (six) hours. Hold for sedation    Yes [provider]  promethazine (PHENERGAN) 25 MG tablet Take 25 mg by mouth every 8 (eight) hours as needed for nausea/vomiting.  04/15/17  Yes [provider]  sennosides-docusate sodium (  SENOKOT-S) 8.6-50 MG tablet Take 1 tablet by mouth daily as needed for constipation.    Yes [provider]  vitamin B-12 (CYANOCOBALAMIN) 1000 MCG tablet Take 4,000 mcg by mouth daily.   Yes [provider]  benzonatate (TESSALON) 100 MG capsule Take 1 capsule (100 mg total) by mouth 3 (three) times daily as needed for cough. Patient not taking: Reported on 08/13/2017 04/28/17   Narda Bonds, MD  dextromethorphan-guaiFENesin [facility] DM) 30-600 MG 12hr tablet Take 1 tablet by mouth 2 (two) times daily. Patient not taking: Reported on 08/13/2017 04/28/17   Narda Bonds, MD  nicotine (NICODERM CQ - DOSED IN MG/24 HR) 7 mg/24hr patch Place 1 patch (7 mg total) onto the skin daily. Patient not taking: Reported on 06/29/2017 04/29/17   Narda Bonds, MD  polyethylene glycol [facility] / GLYCOLAX) packet Take 17 g by mouth daily. Patient not taking: Reported on 08/13/2017 04/28/17   Narda Bonds, MD    Family History Family History  Problem Relation Age of Onset  . Alcohol abuse Mother   . Alcohol abuse Father   . Depression Sister   . Dementia Neg Hx     Social  History Social History  Substance Use Topics  . Smoking status: Current Every Day Smoker    Packs/day: 1.00    Types: Cigarettes  . Smokeless tobacco: Never Used  . Alcohol use No     Allergies   Ambien [zolpidem tartrate]; Beta adrenergic blockers; Fish allergy; Iodinated diagnostic agents; Latex; Metoprolol; Penicillins; Red dye; Shellfish allergy; Sulfa antibiotics; Amoxicillin-pot clavulanate; Aspirin; Dye fdc red  [amaranth (fd&c red #2)]; Other; Tape; Levofloxacin; and Seroquel [quetiapine fumarate]   Review of Systems Review of Systems  Constitutional: Negative for chills, diaphoresis and fever.  Respiratory: Positive for cough, shortness of breath and wheezing.   Cardiovascular: Positive for chest pain.  Gastrointestinal: Positive for nausea. Negative for abdominal pain and vomiting.  Musculoskeletal: Positive for arthralgias.  All other systems reviewed and are negative.    Physical Exam Updated Vital Signs BP 119/67   Pulse 89   Temp 97.7 F (36.5 C) (Axillary)   Resp 15   SpO2 92%   Physical Exam  Constitutional: She appears well-developed and well-nourished. No distress.  Anxious and tearful in bed  HENT:  Head: Normocephalic and atraumatic.  Right Ear: External ear normal.  Left Ear: External ear normal.  Eyes: Pupils are equal, round, and reactive to Vigna. Conjunctivae and EOM are normal. Right eye exhibits no discharge. Left eye exhibits no discharge.  Neck: Normal range of motion. Neck supple. No JVD present. No tracheal deviation present.  Cardiovascular: Normal rate, regular rhythm, normal heart sounds and intact distal pulses.   2+ radial and DP/PT pulses bl, pain on bilateral calf squeeze, right greater than left. No significant swelling of one LE as compared to the other, no palpable cords  Pulmonary/Chest: Effort normal. No stridor. She has wheezes. She exhibits no tenderness.  Somewhat tachypneic, equal rise and fall of chest, expiratory wheezes  in the left lung base  Abdominal: Soft. Bowel sounds are normal. She exhibits no distension. There is no tenderness.  Musculoskeletal: She exhibits tenderness. She exhibits no edema.  Diffuse tenderness to palpation of the right knee, no deformity, crepitus, erythema, or appreciable swelling noted. No varus or valgus deformity, negative anterior/posterior drawer test, no significant swelling posteriorly  Neurological: She is alert.  Skin: Skin is warm and dry. No erythema.  Psychiatric: She has a  normal mood and affect. Her behavior is normal.  Nursing note and vitals reviewed.    ED Treatments / Results  Labs (all labs ordered are listed, but only abnormal results are displayed) Labs Reviewed  BASIC METABOLIC PANEL - Abnormal; Notable for the following:       Result Value   Potassium 3.4 (*)    Glucose, Bld 127 (*)    Creatinine, Ser 1.14 (*)    Calcium 8.7 (*)    GFR calc non Af Amer 53 (*)    All other components within normal limits  CBC WITH DIFFERENTIAL/PLATELET - Abnormal; Notable for the following:    Hemoglobin 11.2 (*)    HCT 34.9 (*)    All other components within normal limits  D-DIMER, QUANTITATIVE (NOT AT [facility]) - Abnormal; Notable for the following:    D-Dimer, Quant 0.97 (*)    All other components within normal limits  I-STAT TROPONIN, ED  I-STAT BETA HCG BLOOD, ED ([facility], [facility], [facility] ONLY)  I-STAT TROPONIN, ED    EKG  EKG Interpretation  Date/Time:  Sunday August 13 2017 07:51:55 EDT Ventricular Rate:  102 PR Interval:    QRS Duration: 96 QT Interval:  381 QTC Calculation: 497 R Axis:   67 Text Interpretation:  Sinus tachycardia Borderline T wave abnormalities Borderline prolonged QT interval Confirmed by Lorre Nick ([id number]) on 08/13/2017 8:30:53 AM       Radiology Dg Chest 2 View  Result Date: 08/13/2017 CLINICAL DATA:  Chest pain and shortness of breath.  Ex-smoker. EXAM: CHEST  2 VIEW COMPARISON:  04/23/2017. FINDINGS: Normal sized heart. Mild  linear density at the left lung base without significant change. The interstitial markings remain mildly prominent. No pleural fluid. Cervical spine fixation hardware. IMPRESSION: No acute abnormality. Stable mild chronic interstitial lung disease and mild left basilar linear scarring. Electronically Signed   By: Beckie Salts M.D.   On: 08/13/2017 08:31   Ct Angio Chest Pe W And/or Wo Contrast  Result Date: 08/13/2017 CLINICAL DATA:  Short of breath which began 1 day prior. EXAM: CT ANGIOGRAPHY CHEST WITH CONTRAST TECHNIQUE: Multidetector CT imaging of the chest was performed using the standard protocol during bolus administration of intravenous contrast. Multiplanar CT image reconstructions and MIPs were obtained to evaluate the vascular anatomy. CONTRAST:  100 cc Isovue COMPARISON:  None. FINDINGS: Cardiovascular: No filling defects within the pulmonary arteries to suggest acute pulmonary embolism. No acute findings of the aorta or great vessels. No pericardial fluid. Mediastinum/Nodes: No axillary supraclavicular adenopathy. Note mediastinal hilar adenopathy. No pericardial fluid. Esophagus normal. Lungs/Pleura: Mild linear atelectasis the lung bases. Mild interlobular septal thickening. No pulmonary infarction. No ground-glass opacities. No airspace disease. No effusion. No pneumothorax. Upper Abdomen: Limited view of the liver, kidneys, pancreas are unremarkable. Normal adrenal glands. Musculoskeletal: No aggressive osseous lesion. Review of the MIP images confirms the above findings. IMPRESSION: 1. No acute pulmonary embolism. 2. Mild basilar atelectasis.  No airspace disease or infarction. Electronically Signed   By: Genevive Bi M.D.   On: 08/13/2017 15:41   Dg Knee Complete 4 Views Right  Result Date: 08/13/2017 CLINICAL DATA:  Patient has fallen three times in the last week and a half, she states that she woke up with pain down her body. EXAM: RIGHT KNEE - COMPLETE 4+ VIEW COMPARISON:  None.  FINDINGS: No evidence of fracture, dislocation. Small effusion in the suprapatellar bursa. No evidence of arthropathy or other focal bone abnormality. Soft tissues are unremarkable. IMPRESSION: Small effusion.  No bone abnormality. Electronically Signed   By: Corlis Leak M.D.   On: 08/13/2017 09:08    Procedures Procedures (including critical care time)  Medications Ordered in ED Medications  morphine 4 MG/ML injection 2 mg (not administered)  predniSONE (DELTASONE) tablet 60 mg (not administered)  doxycycline (VIBRA-TABS) tablet 100 mg (not administered)  albuterol (PROVENTIL) (2.5 MG/3ML) 0.083% nebulizer solution 5 mg (5 mg Nebulization Given 08/13/17 0753)  morphine 4 MG/ML injection 2 mg (2 mg Intravenous Given 08/13/17 0753)  hydrocortisone sodium succinate (SOLU-CORTEF) injection 200 mg (200 mg Intravenous Given 08/13/17 1015)  diphenhydrAMINE (BENADRYL) capsule 50 mg ( Oral See Alternative 08/13/17 1322)    Or  diphenhydrAMINE (BENADRYL) injection 50 mg (50 mg Intravenous Given 08/13/17 1322)  morphine 4 MG/ML injection 2 mg (2 mg Intravenous Given 08/13/17 0935)  iopamidol (ISOVUE-370) 76 % injection (80 mLs  Contrast Given 08/13/17 1446)     Initial Impression / Assessment and Plan / ED Course  I have reviewed the triage vital signs and the nursing notes.  Pertinent labs & imaging results that were available during my care of the patient were reviewed by me and considered in my medical decision making (see chart for details).     Patient with productive cough and shortness of breath for 3 days. Also complaining of burning chest pain, right hand pain and right knee pain. Afebrile, initially tachycardic and tachypneic  Mild nonspecific leukocytosis of 11.2, no significant electrolyte abnormalities. Chest x-ray does show no acute abnormalities, stable mild chronic interstitial lung disease and mild left basilar linear scarring.Serial troponins are negative, and EKG does not show ST segment  abnormality or arrhythmia. I doubt ACS or MI contributing to symptoms. Knee x-ray shows a small effusion but no fracture or dislocation or other bony abnormality.Her joint pains are consistent with her fibromyalgia. D-dimer is elevated, will obtain CTA of the chest to rule out PE. Imaging shows no acute PE but does show mild basilar atelectasis. Low suspicion of DVT given bilateral calf pain and no observable swelling, palpable cords, or erythema. With productive cough and shortness of breath, she is stable for discharge home with short course of steroids and doxycycline, which she has tolerated well in the past. She has a follow-up scheduled with her primary care physician in 2 days, which I encouraged her to keep. Discussed indications for return to the ED. Pain managed while in the ED. Pt and pt's mother verbalized understanding of and agreement with plan and pt is safe for discharge home at this time.    Final Clinical Impressions(s) / ED Diagnoses   Final diagnoses:  Shortness of breath  Productive cough  Atypical chest pain  Multiple joint pain    New Prescriptions New Prescriptions   No medications on file     Bennye Alm 08/13/17 1638    Lorre Nick, MD 08/14/17 [phone]

## 2017-08-13 NOTE — Discharge Instructions (Signed)
Please take all of your antibiotics until finished!   You may develop abdominal discomfort or diarrhea from the antibiotic.  You may help offset this with probiotics which you can buy or get in yogurt. Do not eat  or take the probiotics until 2 hours after your antibiotic.   Take the prednisone as prescribed starting tomorrow. Follow-up with your primary care physician in 2 days for reevaluation. Apply ice or heat to your aching joints for comfort. Do some gentle stretching to avoid stiffness. Return to the ED immediately for any concerning signs or symptoms.

## 2017-08-13 NOTE — ED Notes (Signed)
Patient transported to CT 

## 2017-08-22 ENCOUNTER — Ambulatory Visit (HOSPITAL_COMMUNITY): Payer: Self-pay | Admitting: Licensed Clinical Social Worker

## 2017-08-24 ENCOUNTER — Encounter (HOSPITAL_COMMUNITY): Payer: Self-pay | Admitting: Psychiatry

## 2017-08-24 ENCOUNTER — Ambulatory Visit (INDEPENDENT_AMBULATORY_CARE_PROVIDER_SITE_OTHER): Payer: Medicare Other | Admitting: Psychiatry

## 2017-08-24 VITALS — BP 128/80 | HR 92 | Resp 18 | Ht 64.0 in | Wt 217.0 lb

## 2017-08-24 DIAGNOSIS — F4310 Post-traumatic stress disorder, unspecified: Secondary | ICD-10-CM | POA: Diagnosis not present

## 2017-08-24 DIAGNOSIS — Z818 Family history of other mental and behavioral disorders: Secondary | ICD-10-CM | POA: Diagnosis not present

## 2017-08-24 DIAGNOSIS — F411 Generalized anxiety disorder: Secondary | ICD-10-CM | POA: Diagnosis not present

## 2017-08-24 DIAGNOSIS — F17210 Nicotine dependence, cigarettes, uncomplicated: Secondary | ICD-10-CM

## 2017-08-24 DIAGNOSIS — Z810 Family history of intellectual disabilities: Secondary | ICD-10-CM

## 2017-08-24 DIAGNOSIS — F3130 Bipolar disorder, current episode depressed, mild or moderate severity, unspecified: Secondary | ICD-10-CM

## 2017-08-24 DIAGNOSIS — G8929 Other chronic pain: Secondary | ICD-10-CM

## 2017-08-24 DIAGNOSIS — Z599 Problem related to housing and economic circumstances, unspecified: Secondary | ICD-10-CM

## 2017-08-24 DIAGNOSIS — Z811 Family history of alcohol abuse and dependence: Secondary | ICD-10-CM | POA: Diagnosis not present

## 2017-08-24 MED ORDER — CITALOPRAM HYDROBROMIDE 20 MG PO TABS: 20.0000 mg | ORAL_TABLET | Freq: Every day | ORAL | 1 refills | Status: DC

## 2017-08-24 MED ORDER — OLANZAPINE 5 MG PO TBDP: 5.0000 mg | ORAL_TABLET | Freq: Every day | ORAL | 1 refills | Status: DC

## 2017-08-24 MED ORDER — CITALOPRAM HYDROBROMIDE 20 MG PO TABS: 20.0000 mg | ORAL_TABLET | Freq: Every day | ORAL | 0 refills | Status: DC

## 2017-08-24 MED ORDER — BUPROPION HCL ER (XL) 300 MG PO TB24: 300.0000 mg | ORAL_TABLET | Freq: Every day | ORAL | 1 refills | Status: DC

## 2017-08-24 MED ORDER — BUSPIRONE HCL 7.5 MG PO TABS: 7.5000 mg | ORAL_TABLET | Freq: Two times a day (BID) | ORAL | 1 refills | Status: DC
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
## 2017-09-06 ENCOUNTER — Emergency Department (HOSPITAL_COMMUNITY)
Admission: EM | Admit: 2017-09-06 | Discharge: 2017-09-06 | Disposition: A | Discharge status: Home / Self Care | Payer: Medicare Other | Attending: Emergency Medicine | Admitting: Emergency Medicine

## 2017-09-06 ENCOUNTER — Encounter (HOSPITAL_COMMUNITY): Payer: Self-pay | Admitting: Emergency Medicine

## 2017-09-06 ENCOUNTER — Emergency Department (HOSPITAL_BASED_OUTPATIENT_CLINIC_OR_DEPARTMENT_OTHER): Admit: 2017-09-06 | Discharge: 2017-09-06 | Disposition: A | Discharge status: Home / Self Care | Payer: Medicare Other

## 2017-09-06 ENCOUNTER — Emergency Department (HOSPITAL_COMMUNITY): Payer: Medicare Other

## 2017-09-06 DIAGNOSIS — J449 Chronic obstructive pulmonary disease, unspecified: Secondary | ICD-10-CM | POA: Insufficient documentation

## 2017-09-06 DIAGNOSIS — Z91040 Latex allergy status: Secondary | ICD-10-CM | POA: Diagnosis not present

## 2017-09-06 DIAGNOSIS — M79609 Pain in unspecified limb: Secondary | ICD-10-CM

## 2017-09-06 DIAGNOSIS — M25461 Effusion, right knee: Secondary | ICD-10-CM | POA: Insufficient documentation

## 2017-09-06 DIAGNOSIS — F17210 Nicotine dependence, cigarettes, uncomplicated: Secondary | ICD-10-CM | POA: Insufficient documentation

## 2017-09-06 DIAGNOSIS — Z79899 Other long term (current) drug therapy: Secondary | ICD-10-CM | POA: Insufficient documentation

## 2017-09-06 DIAGNOSIS — I10 Essential (primary) hypertension: Secondary | ICD-10-CM | POA: Diagnosis not present

## 2017-09-06 DIAGNOSIS — M25561 Pain in right knee: Secondary | ICD-10-CM | POA: Diagnosis present

## 2017-09-06 MED ORDER — CYCLOBENZAPRINE HCL 10 MG PO TABS: 10.0000 mg | ORAL_TABLET | Freq: Two times a day (BID) | ORAL | 0 refills | Status: DC | PRN

## 2017-09-06 NOTE — Progress Notes (Signed)
Orthopedic Tech Progress Note Patient Details:  Deanna Yang 10/06/1961 [id number]  Ortho Devices Type of Ortho Device: Crutches, Knee Sleeve Ortho Device/Splint Location: RLE Ortho Device/Splint Interventions: Ordered, Application, Adjustment   Jennye Moccasin 09/06/2017, 6:19 PM

## 2017-09-06 NOTE — ED Provider Notes (Signed)
[facility]-EMERGENCY DEPT Provider Note   CSN: [id number] Arrival date & time: 09/06/17  1338     History   Chief Complaint Chief Complaint  Patient presents with  . Leg Swelling    HPI Deanna Yang is a 56 y.o. female.  HPI   56yo female presents with right leg pain and swelling. Noticed it last night initially and it has worsened throughout the day. Pain with ambulation, bending the knee. Initially seemed to be around knee with extension to the back of knee and pain and tightness radiating down the leg. Fell 2 days ago but did not hurt knee at that time that she thinks. Had initial soreness to her bottom and right paraspinal muscles after fall> No numbness, weakness. No fevers.  Last night when saw the swelling had a panic attack> Reports hx of frequent panic attacks for which she typically comes to hospital however was able to talk herself out of this one. Denies dyspnea or chest pain today.   Past Medical History:  Diagnosis Date  . Anxiety   . COPD (chronic obstructive pulmonary disease) (HCC)   . Depression   . Fibromyalgia   . Hypertension   . Hypertension   . Manic depression (HCC)   . Neuropathy   . OSA (obstructive sleep apnea)     Patient Active Problem List   Diagnosis Date Noted  . Vocal cord dysfunction 04/25/2017  . PTSD (post-traumatic stress disorder) 04/25/2017  . 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 Acute respiratory failure with hypoxia (HCC) 04/22/2017  . Hypokalemia 04/22/2017  . Leukocytosis 04/22/2017  . Prolonged QT interval 04/22/2017  . CFIDS (chronic fatigue and immune dysfunction syndrome) (HCC) 08/20/2015  . Foot pain 08/20/2015  . Essential (primary) hypertension 08/20/2015  . COPD with acute exacerbation (HCC) 08/20/2015  . Obstructive apnea 08/20/2015  . Cervical osteoarthritis 08/20/2015  . Sleep disorder 08/20/2015  . Other specified postprocedural states 07/17/2015  . Borderline personality disorder 06/11/2015    Past  Surgical History:  Procedure Laterality Date  . ABDOMINAL SURGERY    . APPENDECTOMY    . BACK SURGERY    . BREAST REDUCTION SURGERY    . BREAST SURGERY    . CARPAL TUNNEL RELEASE Bilateral   . CESAREAN SECTION    . CHOLECYSTECTOMY    . HERNIA REPAIR    . JOINT REPLACEMENT    . KNEE SURGERY    . TONSILLECTOMY      OB History    No data available       Home Medications    Prior to Admission medications   Medication Sig Start Date End Date Taking? Authorizing Provider  acyclovir ointment (ZOVIRAX) 5 % Apply 1 application topically every 3 (three) hours as needed. 07/12/17   [provider]  albuterol (PROAIR HFA) 108 (90 Base) MCG/ACT inhaler Inhale 2 puffs into the lungs every 4 (four) hours as needed for wheezing. 06/30/17   [provider]  amLODipine (NORVASC) 5 MG tablet Take 5 mg by mouth 2 (two) times daily. 07/05/17   [provider]  baclofen (LIORESAL) 10 MG tablet Take 5 mg by mouth 3 (three) times daily. 03/29/17   [provider]  benzonatate (TESSALON) 100 MG capsule Take 1 capsule (100 mg total) by mouth 3 (three) times daily as needed for cough. Patient not taking: Reported on 08/13/2017 04/28/17   Narda Bonds, MD  budesonide-formoterol [facility]) 160-4.5 MCG/ACT inhaler Inhale 2 puffs into the lungs 2 (two) times daily.  [provider]  buPROPion (WELLBUTRIN XL) 300 MG 24 hr tablet Take 1 tablet (300 mg total) by mouth daily. 08/24/17   Thresa Ross, MD  busPIRone (BUSPAR) 7.5 MG tablet Take 1 tablet (7.5 mg total) by mouth 2 (two) times daily. 08/24/17   Thresa Ross, MD  cetirizine (ZYRTEC) 10 MG tablet Take 10 mg by mouth daily.    [provider]  cholecalciferol (VITAMIN D) 1000 units tablet Take 2,000 Units by mouth daily.    [provider]  citalopram (CELEXA) 20 MG tablet Take 1 tablet (20 mg total) by mouth daily. 08/24/17   Thresa Ross, MD  cyclobenzaprine (FLEXERIL) 10 MG tablet Take 1  tablet (10 mg total) by mouth 2 (two) times daily as needed for muscle spasms. 09/06/17   Alvira Monday, MD  dextromethorphan-guaiFENesin (MUCINEX DM) 30-600 MG 12hr tablet Take 1 tablet by mouth 2 (two) times daily. Patient not taking: Reported on 08/13/2017 04/28/17   Narda Bonds, MD  dicyclomine (BENTYL) 20 MG tablet Take 20 mg by mouth 4 (four) times daily. 07/24/17   [provider]  DUAVEE 0.45-20 MG TABS Take 1 tablet by mouth daily. 05/24/17   [provider]  gabapentin (NEURONTIN) 100 MG capsule Take 2 capsules (200 mg total) by mouth 3 (three) times daily. Patient taking differently: Take 1,800 mg by mouth at bedtime.  07/31/17 08/24/17  Thresa Ross, MD  hydrOXYzine (ATARAX/VISTARIL) 25 MG tablet Take 1 tablet (25 mg total) by mouth every 6 (six) hours as needed for anxiety (mild anxiety). 07/24/17   Shaune Pollack, MD  ibuprofen (ADVIL,MOTRIN) 800 MG tablet Take 800 mg by mouth every 8 (eight) hours as needed for cramping.    [provider]  Immune Globulin, Human, (GAMMAGARD IV) Inject 10 % into the vein every 30 (thirty) days.    [provider]  ipratropium-albuterol (DUONEB) 0.5-2.5 (3) MG/3ML SOLN Inhale 3 mLs into the lungs every 4 (four) hours as needed for wheezing or shortness of breath. 04/15/17   [provider]  LORazepam (ATIVAN) 1 MG tablet Take 1 tablet (1 mg total) by mouth 3 (three) times daily as needed for anxiety (or nausea). 07/24/17   Shaune Pollack, MD  losartan (COZAAR) 100 MG tablet Take 100 mg by mouth daily.    [provider]  morphine (MS CONTIN) 30 MG 12 hr tablet Take 1 tablet (30 mg total) by mouth every 12 (twelve) hours. 04/28/17   Narda Bonds, MD  nicotine (NICODERM CQ - DOSED IN MG/24 HR) 7 mg/24hr patch Place 1 patch (7 mg total) onto the skin daily. Patient not taking: Reported on 08/24/2017 04/29/17   Narda Bonds, MD  NYSTATIN powder Apply 1 application topically 2 (two) times daily as  needed. Under the breast 05/23/17   [provider]  OLANZapine zydis (ZYPREXA) 5 MG disintegrating tablet Take 1 tablet (5 mg total) by mouth at bedtime. 08/24/17   Thresa Ross, MD  oxybutynin (DITROPAN-XL) 10 MG 24 hr tablet Take 10 mg by mouth daily. 06/24/17   [provider]  oxyCODONE-acetaminophen (PERCOCET) 10-325 MG tablet Take 1 tablet by mouth every 6 (six) hours. Hold for sedation     [provider]  polyethylene glycol (MIRALAX / GLYCOLAX) packet Take 17 g by mouth daily. 04/28/17   Narda Bonds, MD  promethazine (PHENERGAN) 25 MG tablet Take 25 mg by mouth every 8 (eight) hours as needed for nausea/vomiting.  04/15/17   [provider]  sennosides-docusate sodium (SENOKOT-S) 8.6-50 MG tablet Take 1 tablet by mouth daily as needed for constipation.     [provider]  vitamin B-12 (CYANOCOBALAMIN) 1000 MCG tablet Take 4,000 mcg by mouth daily.    [provider]    Family History Family History  Problem Relation Age of Onset  . Alcohol abuse Mother   . Alcohol abuse Father   . Depression Sister   . Dementia Neg Hx     Social History Social History  Substance Use Topics  . Smoking status: Current Every Day Smoker    Packs/day: 1.00    Types: Cigarettes  . Smokeless tobacco: Never Used  . Alcohol use No     Allergies   Ambien [zolpidem tartrate]; Beta adrenergic blockers; Fish allergy; Iodinated diagnostic agents; Latex; Metoprolol; Penicillins; Red dye; Shellfish allergy; Sulfa antibiotics; Amoxicillin-pot clavulanate; Aspirin; Dye fdc red  [amaranth (fd&c red #2)]; Other; Tape; Levofloxacin; and Seroquel [quetiapine fumarate]   Review of Systems Review of Systems  Constitutional: Negative for fever.  HENT: Negative for sore throat.   Eyes: Negative for visual disturbance.  Respiratory: Negative for cough and shortness of breath.   Cardiovascular: Positive for leg swelling. Negative for chest pain.    Gastrointestinal: Negative for abdominal pain, nausea and vomiting.  Genitourinary: Negative for difficulty urinating.  Musculoskeletal: Positive for arthralgias and myalgias. Negative for back pain and neck pain.  Skin: Negative for rash.  Neurological: Negative for syncope and headaches.     Physical Exam Updated Vital Signs BP 134/82 (BP Location: Left Arm)   Pulse 82   Temp 98.1 F (36.7 C) (Oral)   Resp 20   SpO2 99%   Physical Exam  Constitutional: She is oriented to person, place, and time. She appears well-developed and well-nourished. No distress.  HENT:  Head: Normocephalic and atraumatic.  Eyes: Conjunctivae and EOM are normal.  Neck: Normal range of motion.  Cardiovascular: Normal rate, regular rhythm, normal heart sounds and intact distal pulses.  Exam reveals no gallop and no friction rub.   No murmur heard. Pulmonary/Chest: Effort normal and breath sounds normal. No respiratory distress. She has no wheezes. She has no rales.  Abdominal: Soft. She exhibits no distension. There is no tenderness. There is no guarding.  Musculoskeletal:       Right knee: She exhibits swelling and ecchymosis. She exhibits normal range of motion, no erythema and normal patellar mobility. Tenderness found. Medial joint line, lateral joint line and patellar tendon tenderness noted.       Right ankle: She exhibits swelling. She exhibits normal pulse.       Right lower leg: She exhibits swelling and edema. She exhibits no bony tenderness and no laceration.  Neurological: She is alert and oriented to person, place, and time. GCS eye subscore is 4. GCS verbal subscore is 5. GCS motor subscore is 6.  Skin: Skin is warm and dry. No rash noted. She is not diaphoretic. No erythema.  Nursing note and vitals reviewed.    ED Treatments / Results  Labs (all labs ordered are listed, but only abnormal results are displayed) Labs Reviewed - No data to display  EKG  EKG Interpretation None        Radiology Dg Knee Complete 4 Views Right  Result Date: 09/06/2017 CLINICAL DATA:  Initial evaluation for acute knee pain. EXAM: RIGHT KNEE - COMPLETE 4+ VIEW COMPARISON:  Prior radiograph from 08/13/2017. FINDINGS: No acute fracture or dislocation. Moderate joint effusion. No radiographic evidence for  significant degenerative or erosive arthropathy. No acute soft tissue abnormality. IMPRESSION: 1. Moderate joint effusion. 2. No acute osseous abnormality about the knee. Electronically Signed   By: Rise Mu M.D.   On: 09/06/2017 17:35    Procedures Procedures (including critical care time)  Medications Ordered in ED Medications - No data to display   Initial Impression / Assessment and Plan / ED Course  I have reviewed the triage vital signs and the nursing notes.  Pertinent labs & imaging results that were available during my care of the patient were reviewed by me and considered in my medical decision making (see chart for details).     56yo female with history above presents with concern for right leg pain. Good pulses, no sign arterial occlusion. DVT study negative. Patient without erythema, no fever, full range of motion of joint and have low suspicion for septic arthritis. XR of shows small effusion without other abnormalities.  Gout is on differential however no hx of this, no significant erythema or swelling, pain more diffuse than just knee. DDx for knee pain and leg pain includes exacerbation of pain related to arthritis, muscle strain, or other meniscal abnormality. Given knee sleeve and crutches to use as needed for comfort.   Patient discharged in stable condition with understanding of reasons to return.    Final Clinical Impressions(s) / ED Diagnoses   Final diagnoses:  Acute pain of right knee  Effusion of right knee, small effusion of right knee    New Prescriptions Discharge Medication List as of 09/06/2017  5:58 PM    START taking these medications    Details  cyclobenzaprine (FLEXERIL) 10 MG tablet Take 1 tablet (10 mg total) by mouth 2 (two) times daily as needed for muscle spasms., Starting Wed 09/06/2017, Print         Alvira Monday, MD 09/07/17 [phone]

## 2017-09-06 NOTE — ED Notes (Signed)
Pt stable, verbalizes understanding of d/c paperwork, d/c via wheelchair.

## 2017-09-06 NOTE — Progress Notes (Signed)
**  Preliminary report by tech**  Right lower extremity venous duplex complete. There is no evidence of deep or superficial vein thrombosis involving the right lower extremity. All visualized vessels appear patent and compressible. There is no evidence of a Baker's cyst on the right. Results were given to Dr. Dalene Seltzer.  09/06/17 5:01 PM Olen Cordial RVT

## 2017-09-06 NOTE — ED Triage Notes (Signed)
Pt to ED c/o recurrent RLE swelling with tightness. Pt reports having about 35 surgeries in the past and having panic attacks. She states she is on oxy q4h and she took a morphine pill this morning with some relief. Pt denies specific injury associated with swelling and pain. She also reports she is out of her Ativan and the MD who refills it is out of the office right now. She states she had a panic attack this morning when she saw the swelling but was able to calm herself down. Denies numbness/tingling. ROM intact.

## 2017-10-05 ENCOUNTER — Other Ambulatory Visit (HOSPITAL_COMMUNITY): Payer: Self-pay | Admitting: Psychiatry

## 2017-10-17 ENCOUNTER — Ambulatory Visit (INDEPENDENT_AMBULATORY_CARE_PROVIDER_SITE_OTHER): Payer: Medicare Other | Admitting: Psychiatry

## 2017-10-17 ENCOUNTER — Encounter (HOSPITAL_COMMUNITY): Payer: Self-pay | Admitting: Psychiatry

## 2017-10-17 VITALS — BP 126/80 | HR 89 | Resp 16 | Ht 64.0 in | Wt 228.0 lb

## 2017-10-17 DIAGNOSIS — F411 Generalized anxiety disorder: Secondary | ICD-10-CM | POA: Diagnosis not present

## 2017-10-17 DIAGNOSIS — G8929 Other chronic pain: Secondary | ICD-10-CM

## 2017-10-17 DIAGNOSIS — F4310 Post-traumatic stress disorder, unspecified: Secondary | ICD-10-CM | POA: Diagnosis not present

## 2017-10-17 DIAGNOSIS — F3130 Bipolar disorder, current episode depressed, mild or moderate severity, unspecified: Secondary | ICD-10-CM

## 2017-10-17 MED ORDER — BUPROPION HCL ER (XL) 300 MG PO TB24: 300.0000 mg | ORAL_TABLET | Freq: Every day | ORAL | 1 refills | Status: DC

## 2017-10-17 MED ORDER — BUSPIRONE HCL 10 MG PO TABS: 10.0000 mg | ORAL_TABLET | Freq: Two times a day (BID) | ORAL | 1 refills | Status: DC

## 2017-10-17 MED ORDER — CITALOPRAM HYDROBROMIDE 20 MG PO TABS: 20.0000 mg | ORAL_TABLET | Freq: Every day | ORAL | 1 refills | Status: DC

## 2017-10-17 MED ORDER — OLANZAPINE 5 MG PO TBDP: 5.0000 mg | ORAL_TABLET | Freq: Every day | ORAL | 1 refills | Status: DC

## 2017-10-17 NOTE — Progress Notes (Signed)
[facility]   Patient Identification: Kristeen MansDolores Sirmons MRN:  [id number] Date of Evaluation:  10/17/2017 Referral Source: Dr. Penelope GalasSheffer Chief Complaint:   Chief Complaint    Follow-up; Medication Refill     Visit Diagnosis:    ICD-10-CM   1. Bipolar I disorder, most recent episode depressed (HCC) F31.30   2. GAD (generalized anxiety disorder) F41.1   3. PTSD (post-traumatic stress disorder) F43.10   4. Other chronic pain G89.29    Diagnosis:   Patient Active Problem List   Diagnosis Date Noted  . Vocal cord dysfunction [J38.3] 04/25/2017  . PTSD (post-traumatic stress disorder) [F43.10] 04/25/2017  . 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 Acute respiratory failure with hypoxia (HCC) [J96.01] 04/22/2017  . Hypokalemia [E87.6] 04/22/2017  . Leukocytosis [D72.829] 04/22/2017  . Prolonged QT interval [R94.31] 04/22/2017  . CFIDS (chronic fatigue and immune dysfunction syndrome) (HCC) [R53.82, D89.89] 08/20/2015  . Foot pain [M79.673] 08/20/2015  . Essential (primary) hypertension [I10] 08/20/2015  . COPD with acute exacerbation (HCC) [J44.1] 08/20/2015  . Obstructive apnea [G47.33] 08/20/2015  . Cervical osteoarthritis [M47.812] 08/20/2015  . Sleep disorder [G47.9] 08/20/2015  . Other specified postprocedural states [Z98.890] 07/17/2015  . Borderline personality disorder (HCC) [F60.3] 06/11/2015  Bipolar disorder, PTSD History of Present Illness:  56 years  old currently single Caucasian female diagnosed with PTSD and depression   she has seen a provider in [place] has changed providers multiple times.  Patient lives by herself get depressed because of loneliness multiple medical issues including pain medication and also back surgeries in the past. Wellbutrin citalopram does help but she has poor support system she also has been diagnosed with PTSD and has some flashbacks from the past also suffers from possible borderline personality disorder  She  is in olanzapine as well that helped sleep but apparently she had 2 go to a different pharmacy for which reason pain clinic discharged her. This is upsetting her.   Not focused on benzo today. Has some she used few weeks ago.   Aggravating factors; lonliness .Finances.abuse history Modifying factors:kids.  Denies recent marijuana use   Severity of depression: improved. 6/10     Past Medical History:  Past Medical History:  Diagnosis Date  . Anxiety   . COPD (chronic obstructive pulmonary disease) (HCC)   . Depression   . Fibromyalgia   . Hypertension   . Hypertension   . Manic depression (HCC)   . Neuropathy   . OSA (obstructive sleep apnea)     Past Surgical History:  Procedure Laterality Date  . ABDOMINAL SURGERY    . APPENDECTOMY    . BACK SURGERY    . BREAST REDUCTION SURGERY    . BREAST SURGERY    . CARPAL TUNNEL RELEASE Bilateral   . CESAREAN SECTION    . CHOLECYSTECTOMY    . HERNIA REPAIR    . JOINT REPLACEMENT    . KNEE SURGERY    . TONSILLECTOMY     Family History:  Family History  Problem Relation Age of Onset  . Alcohol abuse Mother   . Alcohol abuse Father   . Depression Sister   . Dementia Neg Hx    Social History:   Social History   Socioeconomic History  . Marital status: Legally Separated    Spouse name: None  . Number of children: None  . Years of education: None  . Highest education level: None  Social Needs  . Financial resource strain: None  .  Food insecurity - worry: None  . Food insecurity - inability: None  . Transportation needs - medical: None  . Transportation needs - non-medical: None  Occupational History  . None  Tobacco Use  . Smoking status: Former Smoker    Packs/day: 1.00    Types: Cigarettes    Last attempt to quit: 01/17/2017    Years since quitting: 0.7  . Smokeless tobacco: Never Used  Substance and Sexual Activity  . Alcohol use: No    Comment: last use 2-3 years ago  . Drug use: No  . Sexual activity:  Not Currently  Other Topics Concern  . None  Social History Narrative  . None       Psychiatric Specialty Exam: Medication Refill  Pertinent negatives include no nausea or rash.    Review of Systems  Cardiovascular: Negative for palpitations.  Gastrointestinal: Negative for nausea.  Skin: Negative for rash.  Neurological: Negative for tremors.  Psychiatric/Behavioral: Positive for depression. Negative for suicidal ideas. The patient is nervous/anxious.     Blood pressure 126/80, pulse 89, resp. rate 16, height 5\' 4"  (1.626 m), weight 228 lb (103.4 kg), SpO2 95 %.Body mass index is 39.14 kg/m.  General Appearance: Casual  Eye Contact:  Fair  Speech:  Normal Rate  Volume:  Decreased  Mood: subdued  Affect:  constricted  Thought Process:  Coherent  Orientation:  Full (Time, Place, and Person)  Thought Content:  Rumination  Suicidal Thoughts:  No  Homicidal Thoughts:  No  Memory:  Immediate;   Fair Recent;   Fair  Judgement:  Poor  Insight:  Shallow  Psychomotor Activity:  Decreased  Concentration:  Fair  Recall:  [employer] of Knowledge:Fair  Language: Fair  Akathisia:  No  Handed:  Right  AIMS (if indicated):    Assets:  Desire for Improvement Housing Social Support  ADL's:  Intact  Cognition: WNL  Sleep:  Variable to poor    Allergies:   Allergies  Allergen Reactions  . Ambien [Zolpidem Tartrate] Shortness Of Breath and Swelling    Tongue swelling, throat swelling. Tongue swelling, throat swelling.  . Beta Adrenergic Blockers Anaphylaxis  . Fish Allergy Rash and Shortness Of Breath  . Iodinated Diagnostic Agents Itching    Benadryl 50MG  prophylaxis before and after and patient reports she did fine  . Latex Itching    BLISTERS SKIN  . Metoprolol Other (See Comments)    ANY MEDICATION THAT ENDS IN -OLOL-   The patient is asthmatic.  Marland Kitchen Penicillins Shortness Of Breath, Rash and Other (See Comments)    Has patient had a PCN reaction causing immediate  rash, facial/tongue/throat swelling, SOB or lightheadedness with hypotension: Yes Has patient had a PCN reaction causing severe rash involving mucus membranes or skin necrosis: Unknown Has patient had a PCN reaction that required hospitalization No Has patient had a PCN reaction occurring within the last 10 years: No If all of the above answers are "NO", then may proceed with Cephalosporin use.   . Red Dye Hives and Shortness Of Breath  . Shellfish Allergy Anaphylaxis  . Sulfa Antibiotics Hives and Shortness Of Breath  . Amoxicillin-Pot Clavulanate Nausea And Vomiting  . Aspirin Other (See Comments)    Blisters on tongue Chewable 81mg  ok to take  . Dye Fdc Red  [Amaranth (Fd&C Red #2)] Hives    X ray DYE  (BENADRYL 50 MG PROPHYLAXIS AND AFTER AND  SHE DID FINE, PER PATIENT.).  Marland Kitchen Other Hives  .  Tape Dermatitis and Rash    Adhesive   . Levofloxacin Rash    headache  . Seroquel [Quetiapine Fumarate] Other (See Comments)    hallucinate   Current Medications: Current Outpatient Medications  Medication Sig Dispense Refill  . acyclovir ointment (ZOVIRAX) 5 % Apply 1 application topically every 3 (three) hours as needed.  3  . albuterol (PROAIR HFA) 108 (90 Base) MCG/ACT inhaler Inhale 2 puffs into the lungs every 4 (four) hours as needed for wheezing.    Marland Kitchen amLODipine (NORVASC) 5 MG tablet Take 5 mg by mouth 2 (two) times daily.    . baclofen (LIORESAL) 10 MG tablet Take 5 mg by mouth 3 (three) times daily.  3  . budesonide-formoterol (SYMBICORT) 160-4.5 MCG/ACT inhaler Inhale 2 puffs into the lungs 2 (two) times daily.    Marland Kitchen buPROPion (WELLBUTRIN XL) 300 MG 24 hr tablet Take 1 tablet (300 mg total) daily by mouth. 30 tablet 1  . busPIRone (BUSPAR) 10 MG tablet Take 1 tablet (10 mg total) 2 (two) times daily by mouth. 60 tablet 1  . cetirizine (ZYRTEC) 10 MG tablet Take 10 mg by mouth daily.    . cholecalciferol (VITAMIN D) 1000 units tablet Take 2,000 Units by mouth daily.    . citalopram  (CELEXA) 20 MG tablet Take 1 tablet (20 mg total) daily by mouth. 30 tablet 1  . cyclobenzaprine (FLEXERIL) 10 MG tablet Take 1 tablet (10 mg total) by mouth 2 (two) times daily as needed for muscle spasms. 8 tablet 0  . dicyclomine (BENTYL) 20 MG tablet Take 20 mg by mouth 4 (four) times daily.    . DUAVEE 0.45-20 MG TABS Take 1 tablet by mouth daily.  11  . gabapentin (NEURONTIN) 100 MG capsule Take 2 capsules (200 mg total) by mouth 3 (three) times daily. (Patient taking differently: Take 1,800 mg by mouth at bedtime. ) 42 capsule 0  . hydrOXYzine (ATARAX/VISTARIL) 25 MG tablet Take 1 tablet (25 mg total) by mouth every 6 (six) hours as needed for anxiety (mild anxiety). 20 tablet 0  . ibuprofen (ADVIL,MOTRIN) 800 MG tablet Take 800 mg by mouth every 8 (eight) hours as needed for cramping.    . Immune Globulin, Human, (GAMMAGARD IV) Inject 10 % into the vein every 30 (thirty) days.    Marland Kitchen ipratropium-albuterol (DUONEB) 0.5-2.5 (3) MG/3ML SOLN Inhale 3 mLs into the lungs every 4 (four) hours as needed for wheezing or shortness of breath.  11  . LORazepam (ATIVAN) 1 MG tablet Take 1 tablet (1 mg total) by mouth 3 (three) times daily as needed for anxiety (or nausea). 15 tablet 0  . losartan (COZAAR) 100 MG tablet Take 100 mg by mouth daily.    Marland Kitchen morphine (MS CONTIN) 30 MG 12 hr tablet Take 1 tablet (30 mg total) by mouth every 12 (twelve) hours. 10 tablet 0  . NYSTATIN powder Apply 1 application topically 2 (two) times daily as needed. Under the breast  3  . OLANZapine zydis (ZYPREXA) 5 MG disintegrating tablet Take 1 tablet (5 mg total) at bedtime by mouth. 30 tablet 1  . oxybutynin (DITROPAN-XL) 10 MG 24 hr tablet Take 10 mg by mouth daily.  6  . oxyCODONE-acetaminophen (PERCOCET) 10-325 MG tablet Take 1 tablet by mouth every 6 (six) hours. Hold for sedation     . polyethylene glycol (MIRALAX / GLYCOLAX) packet Take 17 g by mouth daily.  0  . promethazine (PHENERGAN) 25 MG tablet Take 25 mg by  mouth every 8 (eight) hours as needed for nausea/vomiting.   4  . sennosides-docusate sodium (SENOKOT-S) 8.6-50 MG tablet Take 1 tablet by mouth daily as needed for constipation.     . vitamin B-12 (CYANOCOBALAMIN) 1000 MCG tablet Take 4,000 mcg by mouth daily.    . benzonatate (TESSALON) 100 MG capsule Take 1 capsule (100 mg total) by mouth 3 (three) times daily as needed for cough. (Patient not taking: Reported on 08/13/2017)    . dextromethorphan-guaiFENesin (MUCINEX DM) 30-600 MG 12hr tablet Take 1 tablet by mouth 2 (two) times daily. (Patient not taking: Reported on 08/13/2017)    . nicotine (NICODERM CQ - DOSED IN MG/24 HR) 7 mg/24hr patch Place 1 patch (7 mg total) onto the skin daily. (Patient not taking: Reported on 08/24/2017)     No current facility-administered medications for this visit.      Treatment Plan Summary: Medication management and Plan as follows  Bipolar disorder vs mood disorder NOS; subdued, not hopeless. Continue meds and find things to do. Side effects reviewed  GAD: ongoig. Will increase buspar to 10mg  bid PTSD: baseline. Continue celexa.    Medical complexity: Follow-up with primary care physician and pain specialist. Will write gabapentin as prescribed by last ER visit till she follows with pain clinic  Questions addressed encouraged to keep herself active he compliant with medications side effects were reviewed provided supportive therapy follow-up into 3 months prescriptions were renewed and BuSpar was increased Faruq Rosenberger 11/6/20181:36 PM

## 2017-10-19 ENCOUNTER — Other Ambulatory Visit (HOSPITAL_COMMUNITY): Payer: Self-pay | Admitting: Psychiatry

## 2017-12-05 ENCOUNTER — Emergency Department (HOSPITAL_COMMUNITY): Payer: Medicare Other

## 2017-12-05 ENCOUNTER — Other Ambulatory Visit: Payer: Self-pay

## 2017-12-05 ENCOUNTER — Emergency Department (HOSPITAL_COMMUNITY)
Admission: EM | Admit: 2017-12-05 | Discharge: 2017-12-05 | Disposition: A | Discharge status: Home / Self Care | Payer: Medicare Other | Attending: Emergency Medicine | Admitting: Emergency Medicine

## 2017-12-05 ENCOUNTER — Encounter (HOSPITAL_COMMUNITY): Payer: Self-pay | Admitting: Emergency Medicine

## 2017-12-05 DIAGNOSIS — R0602 Shortness of breath: Secondary | ICD-10-CM | POA: Diagnosis present

## 2017-12-05 DIAGNOSIS — Z87891 Personal history of nicotine dependence: Secondary | ICD-10-CM | POA: Diagnosis not present

## 2017-12-05 DIAGNOSIS — J440 Chronic obstructive pulmonary disease with acute lower respiratory infection: Secondary | ICD-10-CM

## 2017-12-05 DIAGNOSIS — Z79899 Other long term (current) drug therapy: Secondary | ICD-10-CM | POA: Insufficient documentation

## 2017-12-05 DIAGNOSIS — J441 Chronic obstructive pulmonary disease with (acute) exacerbation: Secondary | ICD-10-CM | POA: Diagnosis not present

## 2017-12-05 DIAGNOSIS — I10 Essential (primary) hypertension: Secondary | ICD-10-CM | POA: Insufficient documentation

## 2017-12-05 DIAGNOSIS — J209 Acute bronchitis, unspecified: Secondary | ICD-10-CM

## 2017-12-05 LAB — CBC WITH DIFFERENTIAL/PLATELET
Basophils Absolute: 0.1 10*3/uL (ref 0.0–0.1)
Basophils Relative: 1 %
Eosinophils Absolute: 0.5 10*3/uL (ref 0.0–0.7)
Eosinophils Relative: 5 %
HCT: 38.6 % (ref 36.0–46.0)
HEMOGLOBIN: 12.2 g/dL (ref 12.0–15.0)
LYMPHS ABS: 2.5 10*3/uL (ref 0.7–4.0)
Lymphocytes Relative: 23 %
MCH: 27.9 pg (ref 26.0–34.0)
MCHC: 31.6 g/dL (ref 30.0–36.0)
MCV: 88.1 fL (ref 78.0–100.0)
MONOS PCT: 4 %
Monocytes Absolute: 0.4 10*3/uL (ref 0.1–1.0)
NEUTROS ABS: 7.2 10*3/uL (ref 1.7–7.7)
NEUTROS PCT: 67 %
Platelets: 362 10*3/uL (ref 150–400)
RBC: 4.38 MIL/uL (ref 3.87–5.11)
RDW: 14 % (ref 11.5–15.5)
WBC: 10.7 10*3/uL — ABNORMAL HIGH (ref 4.0–10.5)

## 2017-12-05 LAB — BASIC METABOLIC PANEL
ANION GAP: 9 (ref 5–15)
BUN: 7 mg/dL (ref 6–20)
CHLORIDE: 102 mmol/L (ref 101–111)
CO2: 27 mmol/L (ref 22–32)
Calcium: 8.2 mg/dL — ABNORMAL LOW (ref 8.9–10.3)
Creatinine, Ser: 1.05 mg/dL — ABNORMAL HIGH (ref 0.44–1.00)
GFR calc non Af Amer: 59 mL/min — ABNORMAL LOW (ref >60)
Glucose, Bld: 139 mg/dL — ABNORMAL HIGH (ref 65–99)
POTASSIUM: 3.1 mmol/L — AB (ref 3.5–5.1)
Sodium: 138 mmol/L (ref 135–145)

## 2017-12-05 LAB — I-STAT ARTERIAL BLOOD GAS, ED
ACID-BASE DEFICIT: 1 mmol/L (ref 0.0–2.0)
BICARBONATE: 23.7 mmol/L (ref 20.0–28.0)
O2 SAT: 92 %
PCO2 ART: 38.2 mmHg (ref 32.0–48.0)
PO2 ART: 65 mmHg — AB (ref 83.0–108.0)
TCO2: 25 mmol/L (ref 22–32)
pH, Arterial: 7.4 (ref 7.350–7.450)

## 2017-12-05 MED ORDER — ALBUTEROL (5 MG/ML) CONTINUOUS INHALATION SOLN
10.0000 mg/h | INHALATION_SOLUTION | Freq: Once | RESPIRATORY_TRACT | Status: AC
  Administered 2017-12-05: 10 mg/h via RESPIRATORY_TRACT

## 2017-12-05 MED ORDER — MAGNESIUM SULFATE 2 GM/50ML IV SOLN
2.0000 g | Freq: Once | INTRAVENOUS | Status: AC
  Administered 2017-12-05: 2 g via INTRAVENOUS
  Filled 2017-12-05: qty 50

## 2017-12-05 MED ORDER — ALBUTEROL SULFATE (2.5 MG/3ML) 0.083% IN NEBU
5.0000 mg | INHALATION_SOLUTION | Freq: Once | RESPIRATORY_TRACT | Status: AC
  Administered 2017-12-05: 5 mg via RESPIRATORY_TRACT
  Filled 2017-12-05: qty 6

## 2017-12-05 MED ORDER — SODIUM CHLORIDE 0.9 % IV BOLUS (SEPSIS)
1000.0000 mL | Freq: Once | INTRAVENOUS | Status: AC
  Administered 2017-12-05: 1000 mL via INTRAVENOUS

## 2017-12-05 MED ORDER — OXYCODONE-ACETAMINOPHEN 5-325 MG PO TABS
2.0000 | ORAL_TABLET | Freq: Once | ORAL | Status: AC
Start: 2017-12-05 — End: 2017-12-05
  Administered 2017-12-05: 2 via ORAL
  Filled 2017-12-05: qty 2

## 2017-12-05 MED ORDER — PREDNISONE 20 MG PO TABS
40.0000 mg | ORAL_TABLET | Freq: Once | ORAL | Status: AC
  Administered 2017-12-05: 40 mg via ORAL
  Filled 2017-12-05: qty 2

## 2017-12-05 MED ORDER — METHYLPREDNISOLONE SODIUM SUCC 125 MG IJ SOLR
125.0000 mg | Freq: Once | INTRAMUSCULAR | Status: AC
  Administered 2017-12-05: 125 mg via INTRAVENOUS
  Filled 2017-12-05: qty 2

## 2017-12-05 MED ORDER — PREDNISONE 10 MG PO TABS: ORAL_TABLET | ORAL | 0 refills | Status: DC

## 2017-12-05 MED ORDER — ALBUTEROL (5 MG/ML) CONTINUOUS INHALATION SOLN
10.0000 mg/h | INHALATION_SOLUTION | Freq: Once | RESPIRATORY_TRACT | Status: AC
  Administered 2017-12-05: 10 mg/h via RESPIRATORY_TRACT
  Filled 2017-12-05: qty 80

## 2017-12-05 MED ORDER — POTASSIUM CHLORIDE CRYS ER 20 MEQ PO TBCR
40.0000 meq | EXTENDED_RELEASE_TABLET | Freq: Once | ORAL | Status: AC
  Administered 2017-12-05: 40 meq via ORAL
  Filled 2017-12-05: qty 2

## 2017-12-05 NOTE — ED Provider Notes (Addendum)
[facility] Provider Note   CSN: [id number] Arrival date & time: 12/05/17  1304     History   Chief Complaint Chief Complaint  Patient presents with  . Shortness of Breath    HPI Deanna Yang is a 56 y.o. female.  Planes of cough and shortness of breath onset approximately a week ago.  She reports that cough is productive of clear sputum with tinges of yellow.  Maximum temperature 99.8 degrees.  She is treated herself with her Pro Air inhaler and albuterol nebulizer at home with partial relief.  She reports that she had "blistering under her breasts" several days ago which have since resolved since she treated a gated powder  HPI  Past Medical History:  Diagnosis Date  . Anxiety   . COPD (chronic obstructive pulmonary disease) (HCC)   . Depression   . Fibromyalgia   . Hypertension   . Hypertension   . Manic depression (HCC)   . Neuropathy   . OSA (obstructive sleep apnea)     Patient Active Problem List   Diagnosis Date Noted  . Vocal cord dysfunction 04/25/2017  . PTSD (post-traumatic stress disorder) 04/25/2017  . 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 Acute respiratory failure with hypoxia (HCC) 04/22/2017  . Hypokalemia 04/22/2017  . Leukocytosis 04/22/2017  . Prolonged QT interval 04/22/2017  . CFIDS (chronic fatigue and immune dysfunction syndrome) (HCC) 08/20/2015  . Foot pain 08/20/2015  . Essential (primary) hypertension 08/20/2015  . COPD with acute exacerbation (HCC) 08/20/2015  . Obstructive apnea 08/20/2015  . Cervical osteoarthritis 08/20/2015  . Sleep disorder 08/20/2015  . Other specified postprocedural states 07/17/2015  . Borderline personality disorder (HCC) 06/11/2015    Past Surgical History:  Procedure Laterality Date  . ABDOMINAL SURGERY    . APPENDECTOMY    . BACK SURGERY    . BREAST REDUCTION SURGERY    . BREAST SURGERY    . CARPAL TUNNEL RELEASE Bilateral   . CESAREAN SECTION    .  CHOLECYSTECTOMY    . HERNIA REPAIR    . JOINT REPLACEMENT    . KNEE SURGERY    . TONSILLECTOMY      OB History    No data available       Home Medications    Prior to Admission medications   Medication Sig Start Date End Date Taking? Authorizing Provider  acyclovir ointment (ZOVIRAX) 5 % Apply 1 application topically every 3 (three) hours as needed. 07/12/17  Yes [provider]  albuterol (PROAIR HFA) 108 (90 Base) MCG/ACT inhaler Inhale 2 puffs into the lungs every 4 (four) hours as needed for wheezing. 06/30/17  Yes [provider]  amLODipine (NORVASC) 5 MG tablet Take 5 mg by mouth 2 (two) times daily. 07/05/17  Yes [provider]  baclofen (LIORESAL) 10 MG tablet Take 5 mg by mouth 3 (three) times daily. 03/29/17  Yes [provider]  budesonide-formoterol (SYMBICORT) 160-4.5 MCG/ACT inhaler Inhale 2 puffs into the lungs 2 (two) times daily.   Yes [provider]  buPROPion (WELLBUTRIN XL) 300 MG 24 hr tablet Take 1 tablet (300 mg total) daily by mouth. 10/17/17  Yes Thresa RossAkhtar, Nadeem, MD  busPIRone (BUSPAR) 10 MG tablet Take 1 tablet (10 mg total) 2 (two) times daily by mouth. 10/17/17  Yes Thresa RossAkhtar, Nadeem, MD  cetirizine (ZYRTEC) 10 MG tablet Take 10 mg by mouth daily.   Yes [provider]  citalopram (CELEXA) 20 MG tablet Take 1  tablet (20 mg total) daily by mouth. 10/17/17  Yes Thresa RossAkhtar, Nadeem, MD  cyclobenzaprine (FLEXERIL) 10 MG tablet Take 1 tablet (10 mg total) by mouth 2 (two) times daily as needed for muscle spasms. 09/06/17  Yes Alvira MondaySchlossman, Erin, MD  gabapentin (NEURONTIN) 100 MG capsule Take 2 capsules (200 mg total) by mouth 3 (three) times daily. Patient taking differently: Take 600 mg by mouth at bedtime.  07/31/17 12/05/17 Yes Thresa RossAkhtar, Nadeem, MD  hydrOXYzine (ATARAX/VISTARIL) 25 MG tablet Take 1 tablet (25 mg total) by mouth every 6 (six) hours as needed for anxiety (mild anxiety). Patient taking differently: Take 25 mg by  mouth 3 (three) times daily.  07/24/17  Yes Shaune PollackIsaacs, Cameron, MD  ipratropium-albuterol (DUONEB) 0.5-2.5 (3) MG/3ML SOLN Inhale 3 mLs into the lungs every 4 (four) hours as needed for wheezing or shortness of breath. 04/15/17  Yes [provider]  ketotifen (ZADITOR) 0.025 % ophthalmic solution Place 1 drop into both eyes at bedtime as needed.   Yes [provider]  LORazepam (ATIVAN) 1 MG tablet Take 1 tablet (1 mg total) by mouth 3 (three) times daily as needed for anxiety (or nausea). 07/24/17  Yes Shaune PollackIsaacs, Cameron, MD  losartan (COZAAR) 100 MG tablet Take 100 mg by mouth daily.   Yes [provider]  morphine (MS CONTIN) 30 MG 12 hr tablet Take 1 tablet (30 mg total) by mouth every 12 (twelve) hours. 04/28/17  Yes Narda BondsNettey, Ralph A, MD  NYSTATIN powder Apply 1 application topically 2 (two) times daily as needed. Under the breast 05/23/17  Yes [provider]  OLANZapine zydis (ZYPREXA) 5 MG disintegrating tablet Take 1 tablet (5 mg total) at bedtime by mouth. 10/17/17  Yes Thresa RossAkhtar, Nadeem, MD  oxybutynin (DITROPAN-XL) 10 MG 24 hr tablet Take 10 mg by mouth at bedtime.  06/24/17  Yes [provider]  oxyCODONE-acetaminophen (PERCOCET) 10-325 MG tablet Take 1 tablet by mouth every 6 (six) hours. Hold for sedation    Yes [provider]  polyethylene glycol (MIRALAX / GLYCOLAX) packet Take 17 g by mouth daily. Patient taking differently: Take 17 g by mouth daily as needed.  04/28/17  Yes Narda BondsNettey, Ralph A, MD  promethazine (PHENERGAN) 25 MG tablet Take 25 mg by mouth every 8 (eight) hours as needed for nausea/vomiting.  04/15/17  Yes [provider]  sennosides-docusate sodium (SENOKOT-S) 8.6-50 MG tablet Take 1 tablet by mouth daily as needed for constipation.    Yes [provider]  benzonatate (TESSALON) 100 MG capsule Take 1 capsule (100 mg total) by mouth 3 (three) times daily as needed for cough. Patient not taking: Reported on 08/13/2017  04/28/17   Narda BondsNettey, Ralph A, MD  cholecalciferol (VITAMIN D) 1000 units tablet Take 2,000 Units by mouth daily.    [provider]  dextromethorphan-guaiFENesin (MUCINEX DM) 30-600 MG 12hr tablet Take 1 tablet by mouth 2 (two) times daily. Patient taking differently: Take 1 tablet by mouth 2 (two) times daily as needed for cough.  04/28/17   Narda BondsNettey, Ralph A, MD  dicyclomine (BENTYL) 20 MG tablet Take 20 mg by mouth 4 (four) times daily. 07/24/17   [provider]  DUAVEE 0.45-20 MG TABS Take 1 tablet by mouth daily. 05/24/17   [provider]  ibuprofen (ADVIL,MOTRIN) 800 MG tablet Take 800 mg by mouth every 8 (eight) hours as needed for cramping.    [provider]  Immune Globulin, Human, (GAMMAGARD IV) Inject 10 % into the vein every 30 (thirty)  days.    [provider]  nicotine (NICODERM CQ - DOSED IN MG/24 HR) 7 mg/24hr patch Place 1 patch (7 mg total) onto the skin daily. Patient not taking: Reported on 08/24/2017 04/29/17   Narda Bonds, MD  vitamin B-12 (CYANOCOBALAMIN) 1000 MCG tablet Take 4,000 mcg by mouth daily.    [provider]    Family History Family History  Problem Relation Age of Onset  . Alcohol abuse Mother   . Alcohol abuse Father   . Depression Sister   . Dementia Neg Hx     Social History Social History   Tobacco Use  . Smoking status: Former Smoker    Packs/day: 1.00    Types: Cigarettes    Last attempt to quit: 01/17/2017    Years since quitting: 0.8  . Smokeless tobacco: Never Used  Substance Use Topics  . Alcohol use: No    Comment: last use 2-3 years ago  . Drug use: No     Allergies   Ambien [zolpidem tartrate]; Beta adrenergic blockers; Fish allergy; Iodinated diagnostic agents; Latex; Metoprolol; Penicillins; Red dye; Shellfish allergy; Sulfa antibiotics; Amoxicillin-pot clavulanate; Aspirin; Dye fdc red  [amaranth (fd&c red #2)]; Other; Tape; Levofloxacin; and Seroquel [quetiapine  fumarate]   Review of Systems Review of Systems  Constitutional: Negative.   HENT: Negative.   Respiratory: Positive for cough and shortness of breath.   Cardiovascular: Negative.   Gastrointestinal: Negative.   Musculoskeletal: Positive for gait problem.       Walks with walker  Skin: Negative.   Psychiatric/Behavioral: Negative.   All other systems reviewed and are negative.    Physical Exam Updated Vital Signs BP (!) 143/72   Pulse 89   Temp 98.4 F (36.9 C) (Oral)   Resp 18   Ht 5\' 6"  (1.676 m)   Wt 110.2 kg (243 lb)   SpO2 95%   BMI 39.22 kg/m   Physical Exam  Constitutional: She appears well-developed and well-nourished.  HENT:  Head: Normocephalic and atraumatic.  Eyes: Conjunctivae are normal. Pupils are equal, round, and reactive to [place].  Neck: Neck supple. No tracheal deviation present. No thyromegaly present.  Cardiovascular: Normal rate and regular rhythm.  No murmur heard. Pulmonary/Chest: Effort normal. She has wheezes.  Expiratory wheezes  Abdominal: Soft. Bowel sounds are normal. She exhibits no distension. There is no tenderness.  Musculoskeletal: Normal range of motion. She exhibits no edema or tenderness.  Neurological: She is alert. Coordination normal.  Skin: Skin is warm and dry. No rash noted.  Psychiatric: She has a normal mood and affect.  Nursing note and vitals reviewed.    ED Treatments / Results  Labs (all labs ordered are listed, but only abnormal results are displayed) Labs Reviewed - No data to display  EKG  EKG Interpretation  Date/Time:  Tuesday December 05 2017 13:12:08 EST Ventricular Rate:  94 PR Interval:  130 QRS Duration: 84 QT Interval:  352 QTC Calculation: 440 R Axis:   41 Text Interpretation:  Normal sinus rhythm Nonspecific ST abnormality Abnormal ECG No significant change since last tracing Confirmed by Doug Sou [phone]) on 12/05/2017 3:52:56 PM       Radiology Dg Chest 2 View  Result Date:  12/05/2017 CLINICAL DATA:  pt states that wheezing started this past week. Cough productive for green thick sputum. Patient reports she has been feeling faint. EXAM: CHEST  2 VIEW COMPARISON:  09/27/2017 FINDINGS: Lower cervical plate and screw fixator. Mild subsegmental atelectasis in the  lower lobes. The lungs appear otherwise clear. Cardiac and mediastinal margins appear normal. No pleural effusion. IMPRESSION: 1. Minimal bibasilar subsegmental atelectasis or scarring. Otherwise, no significant abnormalities are observed. Electronically Signed   By: Gaylyn Rong M.D.   On: 12/05/2017 14:22   X-ray viewed by me Results for orders placed or performed during the hospital encounter of 12/05/17  Basic metabolic panel  Result Value Ref Range   Sodium 138 135 - 145 mmol/L   Potassium 3.1 (L) 3.5 - 5.1 mmol/L   Chloride 102 101 - 111 mmol/L   CO2 27 22 - 32 mmol/L   Glucose, Bld 139 (H) 65 - 99 mg/dL   BUN 7 6 - 20 mg/dL   Creatinine, Ser 7.82 (H) 0.44 - 1.00 mg/dL   Calcium 8.2 (L) 8.9 - 10.3 mg/dL   GFR calc non Af Amer 59 (L) >60 mL/min   GFR calc Af Amer >60 >60 mL/min   Anion gap 9 5 - 15  CBC with Differential/Platelet  Result Value Ref Range   WBC 10.7 (H) 4.0 - 10.5 K/uL   RBC 4.38 3.87 - 5.11 MIL/uL   Hemoglobin 12.2 12.0 - 15.0 g/dL   HCT 95.6 21.3 - 08.6 %   MCV 88.1 78.0 - 100.0 fL   MCH 27.9 26.0 - 34.0 pg   MCHC 31.6 30.0 - 36.0 g/dL   RDW 57.8 46.9 - 62.9 %   Platelets 362 150 - 400 K/uL   Neutrophils Relative % 67 %   Neutro Abs 7.2 1.7 - 7.7 K/uL   Lymphocytes Relative 23 %   Lymphs Abs 2.5 0.7 - 4.0 K/uL   Monocytes Relative 4 %   Monocytes Absolute 0.4 0.1 - 1.0 K/uL   Eosinophils Relative 5 %   Eosinophils Absolute 0.5 0.0 - 0.7 K/uL   Basophils Relative 1 %   Basophils Absolute 0.1 0.0 - 0.1 K/uL   Dg Chest 2 View  Result Date: 12/05/2017 CLINICAL DATA:  pt states that wheezing started this past week. Cough productive for green thick sputum. Patient  reports she has been feeling faint. EXAM: CHEST  2 VIEW COMPARISON:  09/27/2017 FINDINGS: Lower cervical plate and screw fixator. Mild subsegmental atelectasis in the lower lobes. The lungs appear otherwise clear. Cardiac and mediastinal margins appear normal. No pleural effusion. IMPRESSION: 1. Minimal bibasilar subsegmental atelectasis or scarring. Otherwise, no significant abnormalities are observed. Electronically Signed   By: Gaylyn Rong M.D.   On: 12/05/2017 14:22   Procedures Procedures (including critical care time)  Medications Ordered in ED Medications  albuterol (PROVENTIL) (2.5 MG/3ML) 0.083% nebulizer solution 5 mg (5 mg Nebulization Given 12/05/17 1322)  predniSONE (DELTASONE) tablet 40 mg (40 mg Oral Given 12/05/17 1439)  albuterol (PROVENTIL,VENTOLIN) solution continuous neb (10 mg/hr Nebulization Given 12/05/17 1448)     Initial Impression / Assessment and Plan / ED Course  I have reviewed the triage vital signs and the nursing notes.  Pertinent labs & imaging results that were available during my care of the patient were reviewed by me and considered in my medical decision making (see chart for details).     345 a.m. after 1 hour of continuous nebulization treatment with prednisone she reports breathing is slightly improved but not close to baseline.    Lungs with expiratory wheezes.  She does not appear in any respiratory distress.  Additional albuterol ordered  4 40 p.m. patient complaining of diffuse arthralgias for which she chronically takes oxycodone 10-3 25 4  times  daily.  2 Percocets ordered as well as oral potassium supplementation Patient signed out to Dr.Yao 445 PM . Final Clinical Impressions(s) / ED Diagnoses  Dx #1exacerbation of COPD with acute bronchitis Final diagnoses:  None   #2 hypokalemia ED Discharge Orders    None       Doug Sou, MD 12/05/17 1609    Doug Sou, MD 12/05/17 1649

## 2017-12-05 NOTE — Progress Notes (Signed)
Entered room to administer continuous neb. Patient actively removing ECG probes and blood pressure cuff. Says they hurt her skin and she cannot wear them. Nurse at station notified, primary care nurse on transport.

## 2017-12-05 NOTE — ED Triage Notes (Addendum)
Wheezes audible from doorway -- pt states that wheezing started this past week. Cough productive for green thick sputum.  Pt states "I know I have been running a fever, I am blistering under my breasts, and maybe getting yeast-- that happens when I have a fever"

## 2017-12-05 NOTE — ED Provider Notes (Signed)
  Physical Exam  BP 123/72   Pulse (!) 103   Temp 98.4 F (36.9 C) (Oral)   Resp 15   Ht 5\' 6"  (1.676 m)   Wt 110.2 kg (243 lb)   SpO2 92%   BMI 39.22 kg/m   Physical Exam  ED Course/Procedures     Procedures  MDM  Patient care assumed from Dr. Rennis ChrisJacobowitz at 4pm. Patient has hx of COPD and has been having cough and wheezing. Given continuous neb and prednisone but still not improving so labs ordered and sign out pending reassessment.   7:34 PM Patient felt better. Not hypoxic. ABG reassuring. K 3.1, supplemented. CXR clear. Minimal wheezing on exam now. Requests to go home. Will dc home with prednisone. Has albuterol at home       Charlynne PanderYao, David Hsienta, MD 12/05/17 [phone]

## 2017-12-05 NOTE — Discharge Instructions (Signed)
Take prednisone as prescribed.,   Continue your albuterol as needed.   Use compression stockings, elevate your leg, see your physical therapist tomorrow   Decrease salt intake.   See your doctor   Return to ER if you have worse shortness of breath, leg swelling, chest pain.

## 2017-12-05 NOTE — ED Notes (Signed)
Patient transported to X-ray 

## 2017-12-06 MED FILL — Albuterol Sulfate Soln Nebu 0.5% (5 MG/ML): RESPIRATORY_TRACT | Qty: 20 | Status: AC

## 2017-12-11 ENCOUNTER — Telehealth: Payer: Self-pay | Admitting: *Deleted

## 2017-12-11 NOTE — Telephone Encounter (Signed)
Pharmacy called related to Rx: prednisone .Marland Kitchen.Marland Kitchen.EDCM clarified with EDP (Little) to change Rx to: 20 mg tablets.

## 2017-12-16 ENCOUNTER — Emergency Department (HOSPITAL_COMMUNITY): Payer: Medicare Other

## 2017-12-16 ENCOUNTER — Encounter (HOSPITAL_COMMUNITY): Payer: Self-pay | Admitting: Pharmacy Technician

## 2017-12-16 ENCOUNTER — Emergency Department (HOSPITAL_COMMUNITY)
Admission: EM | Admit: 2017-12-16 | Discharge: 2017-12-16 | Disposition: A | Discharge status: Home / Self Care | Payer: Medicare Other | Attending: Emergency Medicine | Admitting: Emergency Medicine

## 2017-12-16 DIAGNOSIS — J449 Chronic obstructive pulmonary disease, unspecified: Secondary | ICD-10-CM | POA: Diagnosis not present

## 2017-12-16 DIAGNOSIS — Z87891 Personal history of nicotine dependence: Secondary | ICD-10-CM | POA: Diagnosis not present

## 2017-12-16 DIAGNOSIS — I10 Essential (primary) hypertension: Secondary | ICD-10-CM | POA: Insufficient documentation

## 2017-12-16 DIAGNOSIS — K921 Melena: Secondary | ICD-10-CM | POA: Insufficient documentation

## 2017-12-16 DIAGNOSIS — R197 Diarrhea, unspecified: Secondary | ICD-10-CM | POA: Diagnosis not present

## 2017-12-16 DIAGNOSIS — R1084 Generalized abdominal pain: Secondary | ICD-10-CM | POA: Insufficient documentation

## 2017-12-16 DIAGNOSIS — R112 Nausea with vomiting, unspecified: Secondary | ICD-10-CM | POA: Diagnosis not present

## 2017-12-16 DIAGNOSIS — Z79899 Other long term (current) drug therapy: Secondary | ICD-10-CM | POA: Diagnosis not present

## 2017-12-16 DIAGNOSIS — Z91040 Latex allergy status: Secondary | ICD-10-CM | POA: Insufficient documentation

## 2017-12-16 LAB — CBC WITH DIFFERENTIAL/PLATELET
Basophils Absolute: 0.1 10*3/uL (ref 0.0–0.1)
Basophils Relative: 1 %
Eosinophils Absolute: 0.4 10*3/uL (ref 0.0–0.7)
Eosinophils Relative: 4 %
HCT: 43.2 % (ref 36.0–46.0)
Hemoglobin: 13.7 g/dL (ref 12.0–15.0)
Lymphocytes Relative: 20 %
Lymphs Abs: 2.2 10*3/uL (ref 0.7–4.0)
MCH: 27.8 pg (ref 26.0–34.0)
MCHC: 31.7 g/dL (ref 30.0–36.0)
MCV: 87.8 fL (ref 78.0–100.0)
Monocytes Absolute: 0.5 10*3/uL (ref 0.1–1.0)
Monocytes Relative: 4 %
Neutro Abs: 8 10*3/uL — ABNORMAL HIGH (ref 1.7–7.7)
Neutrophils Relative %: 71 %
Platelets: 421 10*3/uL — ABNORMAL HIGH (ref 150–400)
RBC: 4.92 MIL/uL (ref 3.87–5.11)
RDW: 14.1 % (ref 11.5–15.5)
WBC: 11.1 10*3/uL — ABNORMAL HIGH (ref 4.0–10.5)

## 2017-12-16 LAB — COMPREHENSIVE METABOLIC PANEL
ALT: 32 U/L (ref 14–54)
AST: 32 U/L (ref 15–41)
Albumin: 3.4 g/dL — ABNORMAL LOW (ref 3.5–5.0)
Alkaline Phosphatase: 85 U/L (ref 38–126)
Anion gap: 8 (ref 5–15)
BUN: 7 mg/dL (ref 6–20)
CO2: 26 mmol/L (ref 22–32)
Calcium: 9 mg/dL (ref 8.9–10.3)
Chloride: 105 mmol/L (ref 101–111)
Creatinine, Ser: 1 mg/dL (ref 0.44–1.00)
GFR calc Af Amer: >60 mL/min (ref >60)
GFR calc non Af Amer: >60 mL/min (ref >60)
Glucose, Bld: 126 mg/dL — ABNORMAL HIGH (ref 65–99)
Potassium: 3.9 mmol/L (ref 3.5–5.1)
Sodium: 139 mmol/L (ref 135–145)
Total Bilirubin: 0.8 mg/dL (ref 0.3–1.2)
Total Protein: 6.7 g/dL (ref 6.5–8.1)

## 2017-12-16 LAB — URINALYSIS, ROUTINE W REFLEX MICROSCOPIC
Bilirubin Urine: NEGATIVE
Glucose, UA: NEGATIVE mg/dL
Hgb urine dipstick: NEGATIVE
Ketones, ur: NEGATIVE mg/dL
Leukocytes, UA: NEGATIVE
Nitrite: NEGATIVE
Protein, ur: NEGATIVE mg/dL
Specific Gravity, Urine: 1.011 (ref 1.005–1.030)
pH: 8 (ref 5.0–8.0)

## 2017-12-16 LAB — LIPASE, BLOOD: Lipase: 28 U/L (ref 11–51)

## 2017-12-16 LAB — POC OCCULT BLOOD, ED: Fecal Occult Bld: NEGATIVE

## 2017-12-16 MED ORDER — IOPAMIDOL (ISOVUE-300) INJECTION 61%
INTRAVENOUS | Status: AC
  Administered 2017-12-16: 100 mL
  Filled 2017-12-16: qty 100

## 2017-12-16 MED ORDER — SODIUM CHLORIDE 0.9 % IV BOLUS (SEPSIS)
1000.0000 mL | Freq: Once | INTRAVENOUS | Status: AC
Start: 2017-12-16 — End: 2017-12-16
  Administered 2017-12-16: 1000 mL via INTRAVENOUS

## 2017-12-16 MED ORDER — DIPHENHYDRAMINE HCL 50 MG/ML IJ SOLN
50.0000 mg | Freq: Once | INTRAMUSCULAR | Status: AC
  Administered 2017-12-16: 50 mg via INTRAVENOUS
  Filled 2017-12-16: qty 1

## 2017-12-16 MED ORDER — DIPHENHYDRAMINE HCL 25 MG PO CAPS
50.0000 mg | ORAL_CAPSULE | Freq: Once | ORAL | Status: AC
  Filled 2017-12-16: qty 2

## 2017-12-16 MED ORDER — MORPHINE SULFATE (PF) 4 MG/ML IV SOLN
4.0000 mg | Freq: Once | INTRAVENOUS | Status: AC
  Administered 2017-12-16: 4 mg via INTRAVENOUS
  Filled 2017-12-16: qty 1

## 2017-12-16 MED ORDER — HYDROCORTISONE NA SUCCINATE PF 250 MG IJ SOLR
200.0000 mg | Freq: Once | INTRAMUSCULAR | Status: AC
  Administered 2017-12-16: 200 mg via INTRAVENOUS
  Filled 2017-12-16: qty 200

## 2017-12-16 MED ORDER — HYDROMORPHONE HCL 1 MG/ML IJ SOLN
1.0000 mg | Freq: Once | INTRAMUSCULAR | Status: AC
  Administered 2017-12-16: 1 mg via INTRAVENOUS
  Filled 2017-12-16: qty 1

## 2017-12-16 MED ORDER — KETOCONAZOLE 2 % EX CREA
TOPICAL_CREAM | Freq: Two times a day (BID) | CUTANEOUS | Status: DC
  Administered 2017-12-16: 1 via TOPICAL
  Filled 2017-12-16: qty 15

## 2017-12-16 NOTE — ED Triage Notes (Signed)
Pt arrives [facility] from home with increased dizziness, weakness, bloody diarrhea X2 days, abd pain, decreased po intake. Here last week copd exacerbation. VSS with ems. EKG unremarkable. Dizziness increased with standing. BP 116/65, P 70 NSR, RR 16, 94%  RA, 147 CBG. A&OX4.

## 2017-12-16 NOTE — Discharge Instructions (Addendum)
Drink plenty of fluids and get plenty of rest.  Follow-up with your primary care physician for reevaluation of your symptoms.  Advance your diet slowly eating a diet of bland foods that will not upset your stomach.  Return to the ED immediately if any concerning signs or symptoms develop such as fever, worsening pain, or if you are unable to keep any food or drink down.

## 2017-12-16 NOTE — ED Notes (Signed)
Pt provided with ice water and saltine crackers

## 2017-12-16 NOTE — ED Provider Notes (Signed)
Care assumed at shift change from [facility] Fawze, [place]. See her note for full HPI and workup. Briefly, pt presenting w 3 days of abdominal pain, N/V/D. Labs showing slightly elev WBC 11.1, CMP unremarkable, lipase wnl, U/A neg. Dispo pending CT abd, as pt with contrast dye allergy and needed pre-medication. Anticipated discharge for viral gastroenteritis, given normal CT results. Pt's symptoms managed in ED. Physical Exam  BP (!) 152/90   Pulse 68   Temp 98.3 F (36.8 C) (Oral)   Resp 14   SpO2 91%   Physical Exam  Constitutional: She appears well-developed and well-nourished.  HENT:  Head: Normocephalic and atraumatic.  Eyes: Conjunctivae are normal.  Pulmonary/Chest: Effort normal.  Psychiatric: She has a normal mood and affect. Her behavior is normal.  Nursing note and vitals reviewed.   ED Course/Procedures     Procedures  MDM  Fecal occult neg; pt reported to nursing that she had a cherry flavored icy drink before the bright red stool. No reoccurrence since the one episode.  CT abd/pelvis is neg for acute intra-abdominal or intrapelvic pathology. Pt's symptoms managed in ED. Tolerating PO prior to discharge. Will send with symptomatic management of likely viral gastroenteritis. PCP follow up recommended. Not in distress, safe for discharge.      Robinson, [place] N, PA-C 12/16/17 2312    Arby BarrettePfeiffer, Marcy, MD 12/25/17 [phone]

## 2017-12-16 NOTE — ED Provider Notes (Signed)
[facility]   CSN: [id number] Arrival date & time: 12/16/17  1117     History   Chief Complaint Chief Complaint  Patient presents with  . Weakness    HPI Deanna Yang is a 57 y.o. female with history of anxiety, COPD, depression, fibromyalgia, HTN, neuropathy, and OSA presents today with chief complaint acute onset, progressively worsening abdominal pain for 3 days.  She notes generalized sharp abdominal pains which worsened with movement and ambulation.  They are not relieved by anything.  She also notes development of nausea, vomiting, and diarrhea 2 days ago.  She notes bright red blood in the commode yesterday which improved today.  She states stools are watery and yellow in color primarily.  She denies urinary symptoms or vaginal symptoms.  She denies fevers or chills.  No chest pain or shortness of breath.  She has tried her usual home medicine without significant relief of her symptoms.  She states she has not been able to keep any food down for the past 2-3 days.  She denies any recent suspicious food intake but states that she went to a steak house a week ago and has been eating leftovers in the past few days.  She denies recent treatment with antibiotics or any known sick contacts.  The history is provided by the patient.    Past Medical History:  Diagnosis Date  . Anxiety   . COPD (chronic obstructive pulmonary disease) (HCC)   . Depression   . Fibromyalgia   . Hypertension   . Hypertension   . Manic depression (HCC)   . Neuropathy   . OSA (obstructive sleep apnea)     Patient Active Problem List   Diagnosis Date Noted  . Vocal cord dysfunction 04/25/2017  . PTSD (post-traumatic stress disorder) 04/25/2017  . 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 Acute respiratory failure with hypoxia (HCC) 04/22/2017  . Hypokalemia 04/22/2017  . Leukocytosis 04/22/2017  . Prolonged QT interval 04/22/2017  . CFIDS  (chronic fatigue and immune dysfunction syndrome) (HCC) 08/20/2015  . Foot pain 08/20/2015  . Essential (primary) hypertension 08/20/2015  . COPD with acute exacerbation (HCC) 08/20/2015  . Obstructive apnea 08/20/2015  . Cervical osteoarthritis 08/20/2015  . Sleep disorder 08/20/2015  . Other specified postprocedural states 07/17/2015  . Borderline personality disorder (HCC) 06/11/2015    Past Surgical History:  Procedure Laterality Date  . ABDOMINAL SURGERY    . APPENDECTOMY    . BACK SURGERY    . BREAST REDUCTION SURGERY    . BREAST SURGERY    . CARPAL TUNNEL RELEASE Bilateral   . CESAREAN SECTION    . CHOLECYSTECTOMY    . HERNIA REPAIR    . JOINT REPLACEMENT    . KNEE SURGERY    . TONSILLECTOMY      OB History    No data available       Home Medications    Prior to Admission medications   Medication Sig Start Date End Date Taking? Authorizing Provider  acyclovir ointment (ZOVIRAX) 5 % Apply 1 application topically every 3 (three) hours as needed. 07/12/17   [provider]  albuterol (PROAIR HFA) 108 (90 Base) MCG/ACT inhaler Inhale 2 puffs into the lungs every 4 (four) hours as needed for wheezing. 06/30/17   [provider]  amLODipine (NORVASC) 5 MG tablet Take 5 mg by mouth 2 (two) times daily. 07/05/17   [provider]  baclofen (LIORESAL) 10 MG tablet  Take 5 mg by mouth 3 (three) times daily. 03/29/17   [provider]  benzonatate (TESSALON) 100 MG capsule Take 1 capsule (100 mg total) by mouth 3 (three) times daily as needed for cough. Patient not taking: Reported on 08/13/2017 04/28/17   Narda Bonds, MD  budesonide-formoterol [facility]) 160-4.5 MCG/ACT inhaler Inhale 2 puffs into the lungs 2 (two) times daily.    [provider]  buPROPion (WELLBUTRIN XL) 300 MG 24 hr tablet Take 1 tablet (300 mg total) daily by mouth. 10/17/17   Thresa Ross, MD  busPIRone (BUSPAR) 10 MG tablet Take 1 tablet (10 mg total) 2 (two)  times daily by mouth. 10/17/17   Thresa Ross, MD  cetirizine (ZYRTEC) 10 MG tablet Take 10 mg by mouth daily.    [provider]  cholecalciferol (VITAMIN D) 1000 units tablet Take 2,000 Units by mouth daily.    [provider]  citalopram (CELEXA) 20 MG tablet Take 1 tablet (20 mg total) daily by mouth. 10/17/17   Thresa Ross, MD  cyclobenzaprine (FLEXERIL) 10 MG tablet Take 1 tablet (10 mg total) by mouth 2 (two) times daily as needed for muscle spasms. 09/06/17   Alvira Monday, MD  dextromethorphan-guaiFENesin (MUCINEX DM) 30-600 MG 12hr tablet Take 1 tablet by mouth 2 (two) times daily. Patient taking differently: Take 1 tablet by mouth 2 (two) times daily as needed for cough.  04/28/17   Narda Bonds, MD  dicyclomine (BENTYL) 20 MG tablet Take 20 mg by mouth 4 (four) times daily. 07/24/17   [provider]  DUAVEE 0.45-20 MG TABS Take 1 tablet by mouth daily. 05/24/17   [provider]  gabapentin (NEURONTIN) 100 MG capsule Take 2 capsules (200 mg total) by mouth 3 (three) times daily. Patient taking differently: Take 600 mg by mouth at bedtime.  07/31/17 12/05/17  Thresa Ross, MD  hydrOXYzine (ATARAX/VISTARIL) 25 MG tablet Take 1 tablet (25 mg total) by mouth every 6 (six) hours as needed for anxiety (mild anxiety). Patient taking differently: Take 25 mg by mouth 3 (three) times daily.  07/24/17   Shaune Pollack, MD  ibuprofen (ADVIL,MOTRIN) 800 MG tablet Take 800 mg by mouth every 8 (eight) hours as needed for cramping.    [provider]  Immune Globulin, Human, (GAMMAGARD IV) Inject 10 % into the vein every 30 (thirty) days.    [provider]  ipratropium-albuterol (DUONEB) 0.5-2.5 (3) MG/3ML SOLN Inhale 3 mLs into the lungs every 4 (four) hours as needed for wheezing or shortness of breath. 04/15/17   [provider]  ketotifen (ZADITOR) 0.025 % ophthalmic solution Place 1 drop into both eyes at bedtime as needed.     [provider]  LORazepam (ATIVAN) 1 MG tablet Take 1 tablet (1 mg total) by mouth 3 (three) times daily as needed for anxiety (or nausea). 07/24/17   Shaune Pollack, MD  losartan (COZAAR) 100 MG tablet Take 100 mg by mouth daily.    [provider]  morphine (MS CONTIN) 30 MG 12 hr tablet Take 1 tablet (30 mg total) by mouth every 12 (twelve) hours. 04/28/17   Narda Bonds, MD  nicotine (NICODERM CQ - DOSED IN MG/24 HR) 7 mg/24hr patch Place 1 patch (7 mg total) onto the skin daily. Patient not taking: Reported on 08/24/2017 04/29/17   Narda Bonds, MD  NYSTATIN powder Apply 1 application topically 2 (two) times daily as needed. Under the breast 05/23/17   [provider]  OLANZapine zydis (ZYPREXA) 5 MG disintegrating tablet Take 1 tablet (5 mg total) at bedtime by mouth. 10/17/17   Thresa Ross, MD  oxybutynin (DITROPAN-XL) 10 MG 24 hr tablet Take 10 mg by mouth at bedtime.  06/24/17   [provider]  oxyCODONE-acetaminophen (PERCOCET) 10-325 MG tablet Take 1 tablet by mouth every 6 (six) hours. Hold for sedation     [provider]  polyethylene glycol (MIRALAX / GLYCOLAX) packet Take 17 g by mouth daily. Patient taking differently: Take 17 g by mouth daily as needed.  04/28/17   Narda Bonds, MD  predniSONE (DELTASONE) 10 MG tablet Take 60 mg daily x 2 days then 40 mg daily x 2 days then 20 mg daily x 2 days 12/05/17   Charlynne Pander, MD  promethazine (PHENERGAN) 25 MG tablet Take 25 mg by mouth every 8 (eight) hours as needed for nausea/vomiting.  04/15/17   [provider]  sennosides-docusate sodium (SENOKOT-S) 8.6-50 MG tablet Take 1 tablet by mouth daily as needed for constipation.     [provider]  vitamin B-12 (CYANOCOBALAMIN) 1000 MCG tablet Take 4,000 mcg by mouth daily.    [provider]    Family History Family History  Problem Relation Age of Onset  . Alcohol abuse Mother   . Alcohol abuse  Father   . Depression Sister   . Dementia Neg Hx     Social History Social History   Tobacco Use  . Smoking status: Former Smoker    Packs/day: 1.00    Types: Cigarettes    Last attempt to quit: 01/17/2017    Years since quitting: 0.9  . Smokeless tobacco: Never Used  Substance Use Topics  . Alcohol use: No    Comment: last use 2-3 years ago  . Drug use: No     Allergies   Ambien [zolpidem tartrate]; Beta adrenergic blockers; Fish allergy; Iodinated diagnostic agents; Latex; Metoprolol; Penicillins; Red dye; Shellfish allergy; Sulfa antibiotics; Amoxicillin-pot clavulanate; Aspirin; Dye fdc red  [amaranth (fd&c red #2)]; Other; Tape; Levofloxacin; and Seroquel [quetiapine fumarate]   Review of Systems Review of Systems  Constitutional: Negative for chills and fever.  Respiratory: Negative for shortness of breath.   Cardiovascular: Negative for chest pain.  Gastrointestinal: Positive for abdominal pain, blood in stool, diarrhea, nausea and vomiting.  Genitourinary: Negative for dysuria, hematuria, vaginal bleeding, vaginal discharge and vaginal pain.  Musculoskeletal: Positive for myalgias.  All other systems reviewed and are negative.    Physical Exam Updated Vital Signs BP 132/88   Pulse 73   Temp 98.3 F (36.8 C) (Oral)   Resp 13   SpO2 92%   Physical Exam  Constitutional: She appears well-developed and well-nourished. No distress.  HENT:  Head: Normocephalic and atraumatic.  Eyes: Conjunctivae are normal. Right eye exhibits no discharge. Left eye exhibits no discharge.  Neck: Normal range of motion. Neck supple. No JVD present. No tracheal deviation present.  Cardiovascular: Normal rate, regular rhythm, normal heart sounds and intact distal pulses.  Pulmonary/Chest: Effort normal and breath sounds normal. No stridor. No respiratory distress. She has no wheezes. She has no rales.  Abdominal: Soft. Bowel sounds are normal. She exhibits no distension. There is  tenderness in the right upper quadrant, right lower quadrant, epigastric area, periumbilical area and suprapubic area. There is guarding. There is no rebound, no CVA tenderness, no tenderness at McBurney's point and negative Murphy's sign.  Musculoskeletal: She exhibits no edema.  Neurological: She is alert.  Skin: Skin is warm and dry. No erythema.  Psychiatric: She has a normal mood and affect. Her behavior is normal.  Nursing note and vitals reviewed.    ED Treatments / Results  Labs (all labs ordered are listed, but only abnormal results are displayed) Labs Reviewed  CBC WITH DIFFERENTIAL/PLATELET  COMPREHENSIVE METABOLIC PANEL  LIPASE, BLOOD  URINALYSIS, ROUTINE W REFLEX MICROSCOPIC  POC OCCULT BLOOD, ED    EKG  EKG Interpretation None       Radiology No results found.  Procedures Procedures (including critical care time)  Medications Ordered in ED Medications - No data to display   Initial Impression / Assessment and Plan / ED Course  I have reviewed the triage vital signs and the nursing notes.  Pertinent labs & imaging results that were available during my care of the patient were reviewed by me and considered in my medical decision making (see chart for details).     Patient presents with abdominal pain, nausea, vomiting, diarrhea, and generalized fatigue for the past 3 days.  Afebrile, vital signs are stable.  Patient is nontoxic in appearance.  She has right-sided abdominal tenderness to palpation with suprapubic, periumbilical, and epigastric tenderness to palpation.  She has a leukocytosis but otherwise lab work is reassuring.  I have ordered a GI panel via PCR if patient is able to give [place] a stool sample.  We will obtain a CT scan for further evaluation.  She does have an allergy to contrast, but states she tolerates contrast with premedication with Benadryl and steroids. Nurse will also obtain hemoccult.  At this time, I have a low suspicion of obstruction,  perforation, appendicitis, TOA, ovarian torsion, mesenteric ischemia, or other acute surgical abdominal pathology.   4:50PM Signed out to oncoming provider PA Roxan Hockey.  Awaiting CT scan.  Patient remains resting comfortably and in no apparent distress.  If there is evidence of acute intra-abdominal pathology such as diverticulitis, we will treat for this.  If imaging is unremarkable, patient likely has a viral gastroenteritis and is stable for discharge home with supportive treatment.  If concerning pathology is seen on imaging, she may require admission into the hospital for further evaluation and management.  Final Clinical Impressions(s) / ED Diagnoses   Final diagnoses:  None    ED Discharge Orders    None       Bennye Alm 12/16/17 Atlee Abide, MD 12/25/17 1436

## 2017-12-16 NOTE — ED Notes (Signed)
Patient transported to CT 

## 2017-12-21 ENCOUNTER — Other Ambulatory Visit (HOSPITAL_COMMUNITY): Payer: Self-pay | Admitting: Psychiatry

## 2017-12-25 ENCOUNTER — Other Ambulatory Visit (HOSPITAL_COMMUNITY): Payer: Self-pay

## 2017-12-25 ENCOUNTER — Telehealth (HOSPITAL_COMMUNITY): Payer: Self-pay

## 2017-12-25 ENCOUNTER — Other Ambulatory Visit (HOSPITAL_COMMUNITY): Payer: Self-pay | Admitting: Psychiatry

## 2017-12-25 MED ORDER — OLANZAPINE 5 MG PO TBDP: 5.0000 mg | ORAL_TABLET | Freq: Every day | ORAL | 0 refills | Status: DC

## 2017-12-25 NOTE — Telephone Encounter (Signed)
Pharmacy sent over a fax requesting refill on Olanzapine 5 mg. Sent over a one month supple. Patient last visit was on 10/17/17, with next visit being on 01/09/18. Nothing further is needed at this time.

## 2017-12-26 ENCOUNTER — Other Ambulatory Visit (HOSPITAL_COMMUNITY): Payer: Self-pay | Admitting: Psychiatry

## 2017-12-26 MED ORDER — BUPROPION HCL ER (XL) 300 MG PO TB24: 300.0000 mg | ORAL_TABLET | Freq: Every day | ORAL | 1 refills | Status: DC

## 2017-12-26 NOTE — Telephone Encounter (Signed)
Send refill for wellbutrin. Has been on it and states no allergy to this med

## 2017-12-26 NOTE — Telephone Encounter (Signed)
Ok I sent.

## 2017-12-26 NOTE — Telephone Encounter (Signed)
Pt needs refill of wellbutrin sent to [facility] [place]

## 2018-01-09 ENCOUNTER — Ambulatory Visit (INDEPENDENT_AMBULATORY_CARE_PROVIDER_SITE_OTHER): Payer: Medicare Other | Admitting: Psychiatry

## 2018-01-09 ENCOUNTER — Other Ambulatory Visit (HOSPITAL_COMMUNITY): Payer: Self-pay | Admitting: Psychiatry

## 2018-01-09 ENCOUNTER — Encounter (HOSPITAL_COMMUNITY): Payer: Self-pay | Admitting: Psychiatry

## 2018-01-09 VITALS — BP 148/90 | HR 110 | Ht 64.0 in | Wt 251.0 lb

## 2018-01-09 DIAGNOSIS — M2550 Pain in unspecified joint: Secondary | ICD-10-CM

## 2018-01-09 DIAGNOSIS — F3130 Bipolar disorder, current episode depressed, mild or moderate severity, unspecified: Secondary | ICD-10-CM

## 2018-01-09 DIAGNOSIS — F4310 Post-traumatic stress disorder, unspecified: Secondary | ICD-10-CM

## 2018-01-09 DIAGNOSIS — Z818 Family history of other mental and behavioral disorders: Secondary | ICD-10-CM | POA: Diagnosis not present

## 2018-01-09 DIAGNOSIS — Z87891 Personal history of nicotine dependence: Secondary | ICD-10-CM | POA: Diagnosis not present

## 2018-01-09 DIAGNOSIS — F411 Generalized anxiety disorder: Secondary | ICD-10-CM

## 2018-01-09 DIAGNOSIS — Z811 Family history of alcohol abuse and dependence: Secondary | ICD-10-CM | POA: Diagnosis not present

## 2018-01-09 DIAGNOSIS — G8929 Other chronic pain: Secondary | ICD-10-CM | POA: Diagnosis not present

## 2018-01-09 DIAGNOSIS — M791 Myalgia, unspecified site: Secondary | ICD-10-CM

## 2018-01-09 MED ORDER — OLANZAPINE 5 MG PO TBDP: 5.0000 mg | ORAL_TABLET | Freq: Every day | ORAL | 0 refills | Status: DC

## 2018-01-09 MED ORDER — CITALOPRAM HYDROBROMIDE 20 MG PO TABS: 30.0000 mg | ORAL_TABLET | Freq: Every day | ORAL | 1 refills | Status: DC

## 2018-01-09 MED ORDER — BUPROPION HCL ER (XL) 300 MG PO TB24: 300.0000 mg | ORAL_TABLET | Freq: Every day | ORAL | 1 refills | Status: DC

## 2018-01-09 MED ORDER — BUSPIRONE HCL 10 MG PO TABS: 10.0000 mg | ORAL_TABLET | Freq: Two times a day (BID) | ORAL | 1 refills | Status: DC
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
## 2018-01-12 ENCOUNTER — Other Ambulatory Visit (HOSPITAL_COMMUNITY): Payer: Self-pay | Admitting: Psychiatry

## 2018-01-27 ENCOUNTER — Other Ambulatory Visit (HOSPITAL_COMMUNITY): Payer: Self-pay | Admitting: Psychiatry

## 2018-02-04 ENCOUNTER — Emergency Department (HOSPITAL_BASED_OUTPATIENT_CLINIC_OR_DEPARTMENT_OTHER)
Admit: 2018-02-04 | Discharge: 2018-02-04 | Disposition: A | Discharge status: Home / Self Care | Payer: Medicare Other | Attending: Emergency Medicine | Admitting: Emergency Medicine

## 2018-02-04 ENCOUNTER — Emergency Department (HOSPITAL_COMMUNITY)
Admission: EM | Admit: 2018-02-04 | Discharge: 2018-02-04 | Disposition: A | Discharge status: Home / Self Care | Payer: Medicare Other | Attending: Emergency Medicine | Admitting: Emergency Medicine

## 2018-02-04 ENCOUNTER — Encounter (HOSPITAL_COMMUNITY): Payer: Self-pay

## 2018-02-04 ENCOUNTER — Emergency Department (HOSPITAL_COMMUNITY): Payer: Medicare Other

## 2018-02-04 DIAGNOSIS — Z79899 Other long term (current) drug therapy: Secondary | ICD-10-CM | POA: Diagnosis not present

## 2018-02-04 DIAGNOSIS — J449 Chronic obstructive pulmonary disease, unspecified: Secondary | ICD-10-CM | POA: Insufficient documentation

## 2018-02-04 DIAGNOSIS — Z87891 Personal history of nicotine dependence: Secondary | ICD-10-CM | POA: Diagnosis not present

## 2018-02-04 DIAGNOSIS — M79609 Pain in unspecified limb: Secondary | ICD-10-CM

## 2018-02-04 DIAGNOSIS — I10 Essential (primary) hypertension: Secondary | ICD-10-CM | POA: Diagnosis not present

## 2018-02-04 DIAGNOSIS — M25561 Pain in right knee: Secondary | ICD-10-CM | POA: Diagnosis present

## 2018-02-04 DIAGNOSIS — Z91040 Latex allergy status: Secondary | ICD-10-CM | POA: Diagnosis not present

## 2018-02-04 DIAGNOSIS — M79651 Pain in right thigh: Secondary | ICD-10-CM | POA: Insufficient documentation

## 2018-02-04 NOTE — ED Triage Notes (Signed)
Pt presents for R sided knee pain and swelling. Reports difficulty ambulating x 1-2 weeks. Pt reports usually walks with a cane but it is getting harder. Denies trauma.

## 2018-02-04 NOTE — ED Notes (Signed)
Declined W/C at D/C and was escorted to lobby by RN. 

## 2018-02-04 NOTE — Progress Notes (Signed)
VASCULAR LAB PRELIMINARY  PRELIMINARY  PRELIMINARY  PRELIMINARY  Right lower extremity venous duplex completed.    Preliminary report:  There is no DVT or SVT noted in the right lower extremity.  Gave report to [facility], PA-C  [place] RVT 02/04/2018, 12:52 PM

## 2018-02-04 NOTE — Discharge Instructions (Signed)
Take your home pain medication as directed. Wear the knee brace for comfort. Follow up with your doctor. Return here as needed.

## 2018-02-04 NOTE — ED Provider Notes (Signed)
[facility] Provider Note   CSN: [id number] Arrival date & time: 02/04/18  1114     History   Chief Complaint Chief Complaint  Patient presents with  . Knee Pain    HPI Deanna Yang is a 57 y.o. female with hx of HTN, COPD, chronic pain and is in pain management clinic who presents to the ED with knee pain. The pain is located in the posterior aspect of the right knee. The pain started 2 weeks ago and has gotten progressively worse. No hx of DVT or PE. Patient did smoke for 30+ years but stopped 9 months ago.  The history is provided by the patient. No language interpreter was used.  Knee Pain   This is a new problem. The problem has been gradually worsening. The pain is present in the right knee and right upper leg. The quality of the pain is described as pounding. The pain is at a severity of 3/10 (goes to 10/10 with standing). Associated symptoms include limited range of motion. She has tried nothing for the symptoms. There has been no history of extremity trauma. Family history is significant for rheumatoid arthritis.    Past Medical History:  Diagnosis Date  . Anxiety   . COPD (chronic obstructive pulmonary disease) (HCC)   . Depression   . Fibromyalgia   . Hypertension   . Hypertension   . Manic depression (HCC)   . Neuropathy   . OSA (obstructive sleep apnea)     Patient Active Problem List   Diagnosis Date Noted  . Vocal cord dysfunction 04/25/2017  . PTSD (post-traumatic stress disorder) 04/25/2017  . 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 Acute respiratory failure with hypoxia (HCC) 04/22/2017  . Hypokalemia 04/22/2017  . Leukocytosis 04/22/2017  . Prolonged QT interval 04/22/2017  . CFIDS (chronic fatigue and immune dysfunction syndrome) (HCC) 08/20/2015  . Foot pain 08/20/2015  . Essential (primary) hypertension 08/20/2015  . COPD with acute exacerbation (HCC) 08/20/2015  . Obstructive apnea 08/20/2015  .  Cervical osteoarthritis 08/20/2015  . Sleep disorder 08/20/2015  . Other specified postprocedural states 07/17/2015  . Borderline personality disorder (HCC) 06/11/2015    Past Surgical History:  Procedure Laterality Date  . ABDOMINAL SURGERY    . APPENDECTOMY    . BACK SURGERY    . BREAST REDUCTION SURGERY    . BREAST SURGERY    . CARPAL TUNNEL RELEASE Bilateral   . CESAREAN SECTION    . CHOLECYSTECTOMY    . HERNIA REPAIR    . JOINT REPLACEMENT    . KNEE SURGERY    . TONSILLECTOMY      OB History    No data available       Home Medications    Prior to Admission medications   Medication Sig Start Date End Date Taking? Authorizing Provider  albuterol (PROAIR HFA) 108 (90 Base) MCG/ACT inhaler Inhale 2 puffs into the lungs every 4 (four) hours as needed for wheezing. 06/30/17   [provider]  amLODipine (NORVASC) 5 MG tablet Take 10 mg by mouth 2 (two) times daily.  07/05/17   [provider]  baclofen (LIORESAL) 10 MG tablet Take 5 mg by mouth 3 (three) times daily. 03/29/17   [provider]  benzonatate (TESSALON) 100 MG capsule Take 1 capsule (100 mg total) by mouth 3 (three) times daily as needed for cough. Patient not taking: Reported on 08/13/2017 04/28/17   Narda BondsNettey, Ralph A, MD  budesonide-formoterol [facility](SYMBICORT)  160-4.5 MCG/ACT inhaler Inhale 2 puffs into the lungs 2 (two) times daily.    [provider]  buPROPion (WELLBUTRIN XL) 300 MG 24 hr tablet Take 1 tablet (300 mg total) by mouth daily. 01/09/18   Thresa Ross, MD  busPIRone (BUSPAR) 10 MG tablet Take 1 tablet (10 mg total) by mouth 2 (two) times daily. 01/09/18   Thresa Ross, MD  cetirizine (ZYRTEC) 10 MG tablet Take 10 mg by mouth daily.    [provider]  citalopram (CELEXA) 20 MG tablet Take 1.5 tablets (30 mg total) by mouth daily. 01/09/18   Thresa Ross, MD  cyclobenzaprine (FLEXERIL) 10 MG tablet Take 1 tablet (10 mg total) by mouth 2 (two) times daily as  needed for muscle spasms. 09/06/17   Alvira Monday, MD  dextromethorphan-guaiFENesin (MUCINEX DM) 30-600 MG 12hr tablet Take 1 tablet by mouth 2 (two) times daily. Patient not taking: Reported on 01/09/2018 04/28/17   Narda Bonds, MD  dicyclomine (BENTYL) 20 MG tablet Take 20 mg by mouth 4 (four) times daily. 07/24/17   [provider]  DUAVEE 0.45-20 MG TABS Take 1 tablet by mouth daily. 05/24/17   [provider]  gabapentin (NEURONTIN) 100 MG capsule Take 2 capsules (200 mg total) by mouth 3 (three) times daily. Patient taking differently: Take 1,800 mg by mouth at bedtime.  07/31/17 12/16/17  Thresa Ross, MD  hydrOXYzine (ATARAX/VISTARIL) 25 MG tablet Take 1 tablet (25 mg total) by mouth every 6 (six) hours as needed for anxiety (mild anxiety). Patient not taking: Reported on 01/09/2018 07/24/17   Shaune Pollack, MD  ibuprofen (ADVIL,MOTRIN) 800 MG tablet Take 800 mg by mouth every 8 (eight) hours as needed for cramping.    [provider]  Immune Globulin, Human, (GAMMAGARD IV) Inject 10 % into the vein every 30 (thirty) days.    [provider]  ipratropium-albuterol (DUONEB) 0.5-2.5 (3) MG/3ML SOLN Inhale 3 mLs into the lungs every 4 (four) hours as needed for wheezing or shortness of breath. 04/15/17   [provider]  ketotifen (ZADITOR) 0.025 % ophthalmic solution Place 1 drop into both eyes at bedtime as needed.    [provider]  LORazepam (ATIVAN) 1 MG tablet Take 1 tablet (1 mg total) by mouth 3 (three) times daily as needed for anxiety (or nausea). 07/24/17   Shaune Pollack, MD  losartan (COZAAR) 100 MG tablet Take 100 mg by mouth daily.    [provider]  morphine (MS CONTIN) 30 MG 12 hr tablet Take 1 tablet (30 mg total) by mouth every 12 (twelve) hours. 04/28/17   Narda Bonds, MD  nicotine (NICODERM CQ - DOSED IN MG/24 HR) 7 mg/24hr patch Place 1 patch (7 mg total) onto the skin daily. Patient not taking: Reported  on 08/24/2017 04/29/17   Narda Bonds, MD  NYSTATIN powder Apply 1 application topically 2 (two) times daily as needed. Under the breast 05/23/17   [provider]  OLANZapine zydis (ZYPREXA) 5 MG disintegrating tablet Take 1 tablet (5 mg total) by mouth at bedtime. 01/09/18   Thresa Ross, MD  oxybutynin (DITROPAN-XL) 10 MG 24 hr tablet Take 10 mg by mouth at bedtime.  06/24/17   [provider]  oxyCODONE-acetaminophen (PERCOCET) 10-325 MG tablet Take 1 tablet by mouth every 6 (six) hours. Hold for sedation     [provider]  polyethylene glycol (MIRALAX / GLYCOLAX) packet Take 17 g by mouth daily. Patient not taking: Reported on 01/09/2018  04/28/17   Narda Bonds, MD  predniSONE (DELTASONE) 10 MG tablet Take 60 mg daily x 2 days then 40 mg daily x 2 days then 20 mg daily x 2 days Patient not taking: Reported on 12/16/2017 12/05/17   Charlynne Pander, MD  promethazine (PHENERGAN) 25 MG tablet Take 25 mg by mouth every 8 (eight) hours as needed for nausea/vomiting.  04/15/17   [provider]  sennosides-docusate sodium (SENOKOT-S) 8.6-50 MG tablet Take 1 tablet by mouth daily as needed for constipation.     [provider]  amphetamine-dextroamphetamine (ADDERALL) 30 MG tablet Take 30 mg by mouth 2 (two) times daily.  10/02/15  [provider]  FLUoxetine (PROZAC) 40 MG capsule Take 1 capsule (40 mg total) by mouth daily. 10/02/15 01/10/17  Thresa Ross, MD  pregabalin (LYRICA) 225 MG capsule Take 225 mg by mouth 2 (two) times daily.  01/10/17  [provider]    Family History Family History  Problem Relation Age of Onset  . Alcohol abuse Mother   . Alcohol abuse Father   . Depression Sister   . Dementia Neg Hx     Social History Social History   Tobacco Use  . Smoking status: Former Smoker    Packs/day: 1.00    Types: Cigarettes    Last attempt to quit: 01/17/2017    Years since quitting: 1.0  . Smokeless tobacco:  Never Used  Substance Use Topics  . Alcohol use: No    Comment: last use 2-3 years ago  . Drug use: No     Allergies   Ambien [zolpidem tartrate]; Beta adrenergic blockers; Fish allergy; Iodinated diagnostic agents; Latex; Metoprolol; Penicillins; Red dye; Shellfish allergy; Sulfa antibiotics; Amoxicillin-pot clavulanate; Aspirin; Dye fdc red  [amaranth (fd&c red #2)]; Other; Tape; Talwin [pentazocine]; Levofloxacin; and Seroquel [quetiapine fumarate]   Review of Systems Review of Systems  Constitutional: Negative for chills and fever.  HENT: Negative.   Eyes: Negative for visual disturbance.  Respiratory: Positive for shortness of breath (chronic, on O2 at home).   Cardiovascular: Negative for chest pain.  Gastrointestinal: Positive for nausea. Negative for abdominal pain, diarrhea and vomiting.  Genitourinary: Negative for dysuria, frequency and urgency.  Musculoskeletal: Positive for back pain (chronic).  Skin: Negative for rash.  Neurological: Negative for syncope.  Psychiatric/Behavioral: Negative for confusion.     Physical Exam Updated Vital Signs BP 134/84 (BP Location: Right Arm)   Pulse 85   Temp 98.4 F (36.9 C) (Oral)   Resp 16   SpO2 96%   Physical Exam  Constitutional: She appears well-developed and well-nourished. No distress.  HENT:  Head: Normocephalic and atraumatic.  Eyes: EOM are normal.  Neck: Neck supple.  Cardiovascular: Normal rate and normal heart sounds.  Pulmonary/Chest: Effort normal and breath sounds normal.  Abdominal: Soft. There is no tenderness.  Musculoskeletal:       Right knee: She exhibits no deformity, no laceration, no erythema and normal alignment. Decreased range of motion: due to pain. Tenderness found.       Legs: Tender with palpation and range of motion to the posterior aspect of the right knee and thigh. Pedal pulses 2+, adequate circulation.   Neurological: She is alert.  Skin: Skin is warm and dry.  Psychiatric: She  has a normal mood and affect. Her behavior is normal.  Nursing note and vitals reviewed.    ED Treatments / Results  Labs (all labs ordered are listed, but only abnormal results are displayed)  Labs Reviewed - No data to display  Radiology Dg Knee Complete 4 Views Right  Result Date: 02/04/2018 CLINICAL DATA:  Pain for months.  No trauma. EXAM: RIGHT KNEE - COMPLETE 4+ VIEW COMPARISON:  September 06, 2017 FINDINGS: Mild degenerative changes in the medial compartment. No other abnormalities. IMPRESSION: Mild degenerative changes in the medial compartment. No other abnormalities. Electronically Signed   By: Gerome Sam III M.D   On: 02/04/2018 12:02    Procedures Procedures (including critical care time)  Medications Ordered in ED Medications - No data to display   Initial Impression / Assessment and Plan / ED Course  I have reviewed the triage vital signs and the nursing notes. 57 y.o. female with right knee pain without known injury stable for d/c with negative DVT study. Patient placed in knee sleeve and she will take her pain medication at home as directed and f/u with her doctor. She has crutches at home. Discussed with the patient that if symptoms worsen or she has problems to return to the ED.    Final Clinical Impressions(s) / ED Diagnoses   Final diagnoses:  Acute pain of right knee    ED Discharge Orders    None       Kerrie Buffalo [place], [place] 02/04/18 1306    Linwood Dibbles, MD 02/04/18 2211

## 2018-02-04 NOTE — ED Notes (Signed)
Pt in xray. They will bring her to room when she is done.

## 2018-02-10 ENCOUNTER — Emergency Department (HOSPITAL_COMMUNITY): Payer: Medicare Other

## 2018-02-10 ENCOUNTER — Encounter (HOSPITAL_COMMUNITY): Payer: Self-pay | Admitting: Emergency Medicine

## 2018-02-10 ENCOUNTER — Emergency Department (HOSPITAL_COMMUNITY)
Admission: EM | Admit: 2018-02-10 | Discharge: 2018-02-10 | Disposition: A | Discharge status: Home / Self Care | Payer: Medicare Other | Attending: Emergency Medicine | Admitting: Emergency Medicine

## 2018-02-10 DIAGNOSIS — Z79899 Other long term (current) drug therapy: Secondary | ICD-10-CM | POA: Diagnosis not present

## 2018-02-10 DIAGNOSIS — I10 Essential (primary) hypertension: Secondary | ICD-10-CM | POA: Diagnosis not present

## 2018-02-10 DIAGNOSIS — J441 Chronic obstructive pulmonary disease with (acute) exacerbation: Secondary | ICD-10-CM | POA: Insufficient documentation

## 2018-02-10 DIAGNOSIS — Z87891 Personal history of nicotine dependence: Secondary | ICD-10-CM | POA: Diagnosis not present

## 2018-02-10 DIAGNOSIS — R0602 Shortness of breath: Secondary | ICD-10-CM | POA: Diagnosis present

## 2018-02-10 LAB — CBC WITH DIFFERENTIAL/PLATELET
BASOS ABS: 0.1 10*3/uL (ref 0.0–0.1)
BASOS PCT: 1 %
EOS ABS: 0.6 10*3/uL (ref 0.0–0.7)
EOS PCT: 7 %
HCT: 39.4 % (ref 36.0–46.0)
HEMOGLOBIN: 12.8 g/dL (ref 12.0–15.0)
LYMPHS ABS: 2 10*3/uL (ref 0.7–4.0)
Lymphocytes Relative: 21 %
MCH: 28.6 pg (ref 26.0–34.0)
MCHC: 32.5 g/dL (ref 30.0–36.0)
MCV: 88.1 fL (ref 78.0–100.0)
Monocytes Absolute: 0.5 10*3/uL (ref 0.1–1.0)
Monocytes Relative: 5 %
NEUTROS PCT: 66 %
Neutro Abs: 6.3 10*3/uL (ref 1.7–7.7)
PLATELETS: 374 10*3/uL (ref 150–400)
RBC: 4.47 MIL/uL (ref 3.87–5.11)
RDW: 14.4 % (ref 11.5–15.5)
WBC: 9.4 10*3/uL (ref 4.0–10.5)

## 2018-02-10 LAB — URINALYSIS, ROUTINE W REFLEX MICROSCOPIC
Bilirubin Urine: NEGATIVE
Glucose, UA: NEGATIVE mg/dL
HGB URINE DIPSTICK: NEGATIVE
Ketones, ur: NEGATIVE mg/dL
LEUKOCYTES UA: NEGATIVE
NITRITE: NEGATIVE
PROTEIN: NEGATIVE mg/dL
SPECIFIC GRAVITY, URINE: 1.013 (ref 1.005–1.030)
pH: 6 (ref 5.0–8.0)

## 2018-02-10 LAB — I-STAT CG4 LACTIC ACID, ED
Lactic Acid, Venous: 3.35 mmol/L (ref 0.5–1.9)
Lactic Acid, Venous: 2.11 mmol/L (ref 0.5–1.9)

## 2018-02-10 LAB — COMPREHENSIVE METABOLIC PANEL
ALT: 43 U/L (ref 14–54)
AST: 39 U/L (ref 15–41)
Albumin: 3.4 g/dL — ABNORMAL LOW (ref 3.5–5.0)
Alkaline Phosphatase: 72 U/L (ref 38–126)
Anion gap: 15 (ref 5–15)
BILIRUBIN TOTAL: 0.7 mg/dL (ref 0.3–1.2)
BUN: 10 mg/dL (ref 6–20)
CALCIUM: 8.8 mg/dL — AB (ref 8.9–10.3)
CHLORIDE: 100 mmol/L — AB (ref 101–111)
CO2: 24 mmol/L (ref 22–32)
CREATININE: 1.04 mg/dL — AB (ref 0.44–1.00)
GFR, EST NON AFRICAN AMERICAN: 59 mL/min — AB (ref >60)
Glucose, Bld: 195 mg/dL — ABNORMAL HIGH (ref 65–99)
Potassium: 3.2 mmol/L — ABNORMAL LOW (ref 3.5–5.1)
Sodium: 139 mmol/L (ref 135–145)
TOTAL PROTEIN: 6.1 g/dL — AB (ref 6.5–8.1)

## 2018-02-10 LAB — I-STAT BETA HCG BLOOD, ED (MC, WL, AP ONLY)

## 2018-02-10 MED ORDER — SODIUM CHLORIDE 0.9 % IV BOLUS (SEPSIS)
1000.0000 mL | Freq: Once | INTRAVENOUS | Status: AC
  Administered 2018-02-10: 1000 mL via INTRAVENOUS

## 2018-02-10 MED ORDER — PREDNISONE 20 MG PO TABS: ORAL_TABLET | ORAL | 0 refills | Status: DC

## 2018-02-10 MED ORDER — ALBUTEROL SULFATE (2.5 MG/3ML) 0.083% IN NEBU
5.0000 mg | INHALATION_SOLUTION | Freq: Once | RESPIRATORY_TRACT | Status: AC
  Administered 2018-02-10: 5 mg via RESPIRATORY_TRACT
  Filled 2018-02-10: qty 6

## 2018-02-10 MED ORDER — ALBUTEROL SULFATE HFA 108 (90 BASE) MCG/ACT IN AERS
1.0000 | INHALATION_SPRAY | RESPIRATORY_TRACT | Status: DC
  Administered 2018-02-10: 1 via RESPIRATORY_TRACT
  Filled 2018-02-10: qty 6.7

## 2018-02-10 MED ORDER — METHYLPREDNISOLONE SODIUM SUCC 125 MG IJ SOLR
125.0000 mg | Freq: Once | INTRAMUSCULAR | Status: AC
  Administered 2018-02-10: 125 mg via INTRAVENOUS
  Filled 2018-02-10: qty 2

## 2018-02-10 NOTE — ED Triage Notes (Addendum)
Pt wheezing, out of rescue inhaler-- wheeezes audible across room. Unable to speak in complete sentences -- pt has had a fever and has had to wear oxygen almost continuously at home.

## 2018-02-10 NOTE — ED Provider Notes (Signed)
[facility] EMERGENCY DEPARTMENT Provider Note   CSN: [id number] Arrival date & time: 02/10/18  1224     History   Chief Complaint Chief Complaint  Patient presents with  . Shortness of Breath    HPI Deanna Yang is a 57 y.o. female.  Patient is a 57 year old female with a history of COPD and asthma who presents with shortness of breath.  She states she is felt short of breath and wheezing over the last 2-3 weeks.  She states she always feels short of breath but is been worse over the last 2-3 weeks and that she can only walk short distances without getting wheezy.  She also had to use her oxygen more frequently.  She normally only has to use her supplemental oxygen when she is lying down but she has been using it more frequently during the day over the last 2-3 weeks.  She is noticed a little bit of increased swelling in her lower extremities.  She states she always has a little more increased swelling in her right as compared to her left.  She denies any chest pain.  She has had a little bit of rhinorrhea and a cough which is productive of some white mucus.  She states she had a temperature of 100 yesterday but no other fevers.  She does complain of some achiness in her joints and muscles.  She denies any nausea or vomiting or diarrhea.  She uses [employer] as well as Symbicort.  She ran out of her pro-air earlier this morning.      Past Medical History:  Diagnosis Date  . Anxiety   . COPD (chronic obstructive pulmonary disease) (HCC)   . Depression   . Fibromyalgia   . Hypertension   . Hypertension   . Manic depression (HCC)   . Neuropathy   . OSA (obstructive sleep apnea)     Patient Active Problem List   Diagnosis Date Noted  . Vocal cord dysfunction 04/25/2017  . PTSD (post-traumatic stress disorder) 04/25/2017  . 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 Acute respiratory failure with hypoxia (HCC) 04/22/2017  . Hypokalemia 04/22/2017  .  Leukocytosis 04/22/2017  . Prolonged QT interval 04/22/2017  . CFIDS (chronic fatigue and immune dysfunction syndrome) (HCC) 08/20/2015  . Foot pain 08/20/2015  . Essential (primary) hypertension 08/20/2015  . COPD with acute exacerbation (HCC) 08/20/2015  . Obstructive apnea 08/20/2015  . Cervical osteoarthritis 08/20/2015  . Sleep disorder 08/20/2015  . Other specified postprocedural states 07/17/2015  . Borderline personality disorder (HCC) 06/11/2015    Past Surgical History:  Procedure Laterality Date  . ABDOMINAL SURGERY    . APPENDECTOMY    . BACK SURGERY    . BREAST REDUCTION SURGERY    . BREAST SURGERY    . CARPAL TUNNEL RELEASE Bilateral   . CESAREAN SECTION    . CHOLECYSTECTOMY    . HERNIA REPAIR    . JOINT REPLACEMENT    . KNEE SURGERY    . TONSILLECTOMY      OB History    No data available       Home Medications    Prior to Admission medications   Medication Sig Start Date End Date Taking? Authorizing Provider  acyclovir ointment (ZOVIRAX) 5 % Apply 1 application topically every 3 (three) hours as needed. Affected area on mouth 01/27/18  Yes [provider]  albuterol (PROAIR HFA) 108 (90 Base) MCG/ACT inhaler Inhale 2 puffs into the lungs every 4 (  four) hours as needed for wheezing. 06/30/17  Yes [provider]  amLODipine (NORVASC) 5 MG tablet Take 10 mg by mouth daily.  07/05/17  Yes [provider]  baclofen (LIORESAL) 10 MG tablet Take 10 mg by mouth 3 (three) times daily.  03/29/17  Yes [provider]  budesonide-formoterol (SYMBICORT) 160-4.5 MCG/ACT inhaler Inhale 2 puffs into the lungs 2 (two) times daily.   Yes [provider]  buPROPion (WELLBUTRIN XL) 300 MG 24 hr tablet Take 1 tablet (300 mg total) by mouth daily. 01/09/18  Yes Thresa Ross, MD  busPIRone (BUSPAR) 10 MG tablet Take 1 tablet (10 mg total) by mouth 2 (two) times daily. 01/09/18  Yes Thresa Ross, MD  cetirizine (ZYRTEC) 10 MG tablet  Take 10 mg by mouth daily.   Yes [provider]  citalopram (CELEXA) 20 MG tablet Take 1.5 tablets (30 mg total) by mouth daily. 01/09/18  Yes Thresa Ross, MD  CLIMARA PRO 0.045-0.015 MG/DAY Apply 0.045 mg topically once a week. 02/02/18  Yes [provider]  cyclobenzaprine (FLEXERIL) 10 MG tablet Take 1 tablet (10 mg total) by mouth 2 (two) times daily as needed for muscle spasms. 09/06/17  Yes Alvira Monday, MD  gabapentin (NEURONTIN) 100 MG capsule Take 2 capsules (200 mg total) by mouth 3 (three) times daily. Patient taking differently: Take 1,800 mg by mouth at bedtime.  07/31/17 02/10/18 Yes Thresa Ross, MD  hydrOXYzine (ATARAX/VISTARIL) 25 MG tablet Take 1 tablet (25 mg total) by mouth every 6 (six) hours as needed for anxiety (mild anxiety). 07/24/17  Yes Shaune Pollack, MD  ibuprofen (ADVIL,MOTRIN) 200 MG tablet Take 800 mg by mouth every 6 (six) hours as needed for headache.   Yes [provider]  Immune Globulin, Human, (GAMMAGARD IV) Inject 10 % into the vein every 30 (thirty) days.   Yes [provider]  ipratropium-albuterol (DUONEB) 0.5-2.5 (3) MG/3ML SOLN Inhale 3 mLs into the lungs every 4 (four) hours as needed for wheezing or shortness of breath. 04/15/17  Yes [provider]  ketotifen (ZADITOR) 0.025 % ophthalmic solution Place 1 drop into both eyes at bedtime as needed.   Yes [provider]  LORazepam (ATIVAN) 1 MG tablet Take 1 tablet (1 mg total) by mouth 3 (three) times daily as needed for anxiety (or nausea). 07/24/17  Yes Shaune Pollack, MD  losartan (COZAAR) 100 MG tablet Take 100 mg by mouth daily.   Yes [provider]  morphine (MS CONTIN) 30 MG 12 hr tablet Take 1 tablet (30 mg total) by mouth every 12 (twelve) hours. 04/28/17  Yes Narda Bonds, MD  NYSTATIN powder Apply 1 application topically 2 (two) times daily as needed. Under the breast 05/23/17  Yes [provider]  OLANZapine zydis  (ZYPREXA) 5 MG disintegrating tablet Take 1 tablet (5 mg total) by mouth at bedtime. 01/09/18  Yes Thresa Ross, MD  oxyCODONE-acetaminophen (PERCOCET) 10-325 MG tablet Take 1 tablet by mouth every 6 (six) hours as needed for pain. Hold for sedation    Yes [provider]  promethazine (PHENERGAN) 25 MG tablet Take 25 mg by mouth every 8 (eight) hours as needed for nausea/vomiting.  04/15/17  Yes [provider]  benzonatate (TESSALON) 100 MG capsule Take 1 capsule (100 mg total) by mouth 3 (three) times daily as needed for cough. Patient not taking: Reported on 08/13/2017 04/28/17   Narda Bonds, MD  dextromethorphan-guaiFENesin [facility] DM) 30-600 MG 12hr tablet Take 1 tablet  by mouth 2 (two) times daily. Patient not taking: Reported on 01/09/2018 04/28/17   Narda Bonds, MD  nicotine (NICODERM CQ - DOSED IN MG/24 HR) 7 mg/24hr patch Place 1 patch (7 mg total) onto the skin daily. Patient not taking: Reported on 08/24/2017 04/29/17   Narda Bonds, MD  polyethylene glycol [facility] / GLYCOLAX) packet Take 17 g by mouth daily. Patient not taking: Reported on 01/09/2018 04/28/17   Narda Bonds, MD  predniSONE (DELTASONE) 20 MG tablet 2 tabs po daily x 4 days 02/10/18   Rolan Bucco, MD  amphetamine-dextroamphetamine (ADDERALL) 30 MG tablet Take 30 mg by mouth 2 (two) times daily.  10/02/15  [provider]  FLUoxetine (PROZAC) 40 MG capsule Take 1 capsule (40 mg total) by mouth daily. 10/02/15 01/10/17  Thresa Ross, MD  pregabalin (LYRICA) 225 MG capsule Take 225 mg by mouth 2 (two) times daily.  01/10/17  [provider]    Family History Family History  Problem Relation Age of Onset  . Alcohol abuse Mother   . Alcohol abuse Father   . Depression Sister   . Dementia Neg Hx     Social History Social History   Tobacco Use  . Smoking status: Former Smoker    Packs/day: 1.00    Types: Cigarettes    Last attempt to quit: 01/17/2017    Years since  quitting: 1.0  . Smokeless tobacco: Never Used  Substance Use Topics  . Alcohol use: No    Comment: last use 2-3 years ago  . Drug use: No     Allergies   Ambien [zolpidem tartrate]; Beta adrenergic blockers; Fish allergy; Iodinated diagnostic agents; Latex; Metoprolol; Penicillins; Red dye; Shellfish allergy; Sulfa antibiotics; Amoxicillin-pot clavulanate; Aspirin; Dye fdc red  [amaranth (fd&c red #2)]; Other; Tape; Talwin [pentazocine]; Levofloxacin; and Seroquel [quetiapine fumarate]   Review of Systems Review of Systems  Constitutional: Positive for fatigue and fever. Negative for chills and diaphoresis.  HENT: Negative for congestion, rhinorrhea and sneezing.   Eyes: Negative.   Respiratory: Positive for cough, shortness of breath and wheezing. Negative for chest tightness.   Cardiovascular: Positive for leg swelling. Negative for chest pain.  Gastrointestinal: Negative for abdominal pain, blood in stool, diarrhea, nausea and vomiting.  Genitourinary: Negative for difficulty urinating, flank pain, frequency and hematuria.  Musculoskeletal: Negative for arthralgias and back pain.  Skin: Negative for rash.  Neurological: Negative for dizziness, speech difficulty, weakness, numbness and headaches.     Physical Exam Updated Vital Signs BP 110/69   Pulse 87   Temp 98.2 F (36.8 C) (Oral)   Resp (!) 21   SpO2 93%   Physical Exam  Constitutional: She is oriented to person, place, and time. She appears well-developed and well-nourished.  HENT:  Head: Normocephalic and atraumatic.  Eyes: Pupils are equal, round, and reactive to Lines.  Neck: Normal range of motion. Neck supple.  Cardiovascular: Normal rate, regular rhythm and normal heart sounds.  Pulmonary/Chest: Effort normal. No respiratory distress. She has wheezes. She has no rales. She exhibits no tenderness.  Abdominal: Soft. Bowel sounds are normal. There is no tenderness. There is no rebound and no guarding.    Musculoskeletal: Normal range of motion. She exhibits no edema.  Lymphadenopathy:    She has no cervical adenopathy.  Neurological: She is alert and oriented to person, place, and time.  Skin: Skin is warm and dry. No rash noted.  Psychiatric: She has a normal mood and affect.  ED Treatments / Results  Labs (all labs ordered are listed, but only abnormal results are displayed) Labs Reviewed  COMPREHENSIVE METABOLIC PANEL - Abnormal; Notable for the following components:      Result Value   Potassium 3.2 (*)    Chloride 100 (*)    Glucose, Bld 195 (*)    Creatinine, Ser 1.04 (*)    Calcium 8.8 (*)    Total Protein 6.1 (*)    Albumin 3.4 (*)    GFR calc non Af Amer 59 (*)    All other components within normal limits  I-STAT CG4 LACTIC ACID, ED - Abnormal; Notable for the following components:   Lactic Acid, Venous 3.35 (*)    All other components within normal limits  I-STAT CG4 LACTIC ACID, ED - Abnormal; Notable for the following components:   Lactic Acid, Venous 2.11 (*)    All other components within normal limits  CULTURE, BLOOD (ROUTINE X 2)  CULTURE, BLOOD (ROUTINE X 2)  CBC WITH DIFFERENTIAL/PLATELET  URINALYSIS, ROUTINE W REFLEX MICROSCOPIC  I-STAT BETA HCG BLOOD, ED ([facility], [facility], [facility] ONLY)    EKG  EKG Interpretation  Date/Time:  Saturday February 10 2018 15:51:38 EST Ventricular Rate:  90 PR Interval:    QRS Duration: 110 QT Interval:  397 QTC Calculation: 486 R Axis:   53 Text Interpretation:  Sinus rhythm Low voltage, precordial leads Minimal ST depression, anterior leads Borderline prolonged QT interval Baseline wander in lead(s) I II aVR V2 V3 since last tracing no significant change Confirmed by Rolan Bucco [phone]) on 02/10/2018 3:54:34 PM       Radiology Dg Chest Port 1 View  Result Date: 02/10/2018 CLINICAL DATA:  Initial evaluation for acute shortness of breath. EXAM: PORTABLE CHEST 1 VIEW COMPARISON:  Prior radiograph from 12/09/2017. FINDINGS: The  cardiac and mediastinal silhouettes are stable in size and contour, and remain within normal limits. The lungs are normally inflated. Mild bibasilar atelectasis and/or scarring. No airspace consolidation, pleural effusion, or pulmonary edema is identified. There is no pneumothorax. No acute osseous abnormality identified.  Cervical ACDF noted. IMPRESSION: 1. Mild bibasilar atelectasis and/or scarring. 2. No other active cardiopulmonary disease. Electronically Signed   By: Rise Mu M.D.   On: 02/10/2018 13:10    Procedures Procedures (including critical care time)  Medications Ordered in ED Medications  albuterol (PROVENTIL) (2.5 MG/3ML) 0.083% nebulizer solution 5 mg (5 mg Nebulization Given 02/10/18 1248)  methylPREDNISolone sodium succinate (SOLU-MEDROL) 125 mg/2 mL injection 125 mg (125 mg Intravenous Given 02/10/18 1311)  sodium chloride 0.9 % bolus 1,000 mL (0 mLs Intravenous Stopped 02/10/18 1554)  albuterol (PROVENTIL) (2.5 MG/3ML) 0.083% nebulizer solution 5 mg (5 mg Nebulization Given 02/10/18 1553)     Initial Impression / Assessment and Plan / ED Course  I have reviewed the triage vital signs and the nursing notes.  Pertinent labs & imaging results that were available during my care of the patient were reviewed by me and considered in my medical decision making (see chart for details).     Patient is a 57 year old female who presents with a COPD exacerbation.  She has wheezing and shortness of breath on exam.  Her symptoms improved after 2 nebulizer treatments.  She was also given Solu-Medrol.  She has no evidence of pneumonia.  She has no fever.  She has no other suggestions of infection.  Her lactate was elevated but it does not appear to be consistent with sepsis.  She is well-appearing and is  ready to go home.  She had no hypoxia on room air.  She does have some mild edema to her lower extremities which appears to be chronic for her.  She has had 2- Doppler ultrasounds of her  right lower leg.  The increased swelling in her right leg as compared left appears to be chronic as well.  I do not feel that she needs repeat imaging today.  She has had one within the month.  She was discharged home in good condition.  She was encouraged to follow-up with her pulmonologist.  She was dispensed an albuterol inhaler.  She was also started on prednisone burst.  Return precautions were given.  Final Clinical Impressions(s) / ED Diagnoses   Final diagnoses:  COPD exacerbation [facility](HCC)    ED Discharge Orders        Ordered    predniSONE (DELTASONE) 20 MG tablet     02/10/18 1625       Rolan BuccoBelfi, Shaunette Gassner, MD 02/10/18 1627

## 2018-02-15 LAB — CULTURE, BLOOD (ROUTINE X 2)
CULTURE: NO GROWTH
Culture: NO GROWTH
SPECIAL REQUESTS: ADEQUATE
Special Requests: ADEQUATE

## 2018-02-22 ENCOUNTER — Other Ambulatory Visit (HOSPITAL_COMMUNITY): Payer: Self-pay | Admitting: Psychiatry

## 2018-03-01 ENCOUNTER — Ambulatory Visit (HOSPITAL_COMMUNITY): Payer: Self-pay | Admitting: Psychiatry

## 2018-03-02 ENCOUNTER — Other Ambulatory Visit: Payer: Self-pay

## 2018-03-02 ENCOUNTER — Encounter (HOSPITAL_COMMUNITY): Payer: Self-pay | Admitting: *Deleted

## 2018-03-02 ENCOUNTER — Emergency Department (HOSPITAL_COMMUNITY)
Admission: EM | Admit: 2018-03-02 | Discharge: 2018-03-02 | Disposition: A | Discharge status: Home / Self Care | Payer: Medicare Other | Attending: Emergency Medicine | Admitting: Emergency Medicine

## 2018-03-02 DIAGNOSIS — F419 Anxiety disorder, unspecified: Secondary | ICD-10-CM | POA: Diagnosis not present

## 2018-03-02 DIAGNOSIS — M5440 Lumbago with sciatica, unspecified side: Secondary | ICD-10-CM | POA: Diagnosis not present

## 2018-03-02 DIAGNOSIS — Z91040 Latex allergy status: Secondary | ICD-10-CM | POA: Insufficient documentation

## 2018-03-02 DIAGNOSIS — F329 Major depressive disorder, single episode, unspecified: Secondary | ICD-10-CM | POA: Diagnosis not present

## 2018-03-02 DIAGNOSIS — G8929 Other chronic pain: Secondary | ICD-10-CM

## 2018-03-02 DIAGNOSIS — F603 Borderline personality disorder: Secondary | ICD-10-CM | POA: Diagnosis not present

## 2018-03-02 DIAGNOSIS — J449 Chronic obstructive pulmonary disease, unspecified: Secondary | ICD-10-CM | POA: Insufficient documentation

## 2018-03-02 DIAGNOSIS — M545 Low back pain: Secondary | ICD-10-CM | POA: Diagnosis present

## 2018-03-02 DIAGNOSIS — Z79899 Other long term (current) drug therapy: Secondary | ICD-10-CM | POA: Diagnosis not present

## 2018-03-02 DIAGNOSIS — Z87891 Personal history of nicotine dependence: Secondary | ICD-10-CM | POA: Insufficient documentation

## 2018-03-02 DIAGNOSIS — I10 Essential (primary) hypertension: Secondary | ICD-10-CM | POA: Diagnosis not present

## 2018-03-02 DIAGNOSIS — Z9049 Acquired absence of other specified parts of digestive tract: Secondary | ICD-10-CM | POA: Insufficient documentation

## 2018-03-02 MED ORDER — DIAZEPAM 2 MG PO TABS: 2.0000 mg | ORAL_TABLET | ORAL | 0 refills | Status: DC | PRN

## 2018-03-02 MED ORDER — DIAZEPAM 5 MG PO TABS
10.0000 mg | ORAL_TABLET | Freq: Once | ORAL | Status: AC
  Administered 2018-03-02: 10 mg via ORAL
  Filled 2018-03-02: qty 2

## 2018-03-02 NOTE — ED Provider Notes (Addendum)
[facility] EMERGENCY DEPARTMENT Provider Note   CSN: [id number] Arrival date & time: 03/02/18  1238     History   Chief Complaint Chief Complaint  Patient presents with  . Back Pain    HPI Deanna Yang is a 57 y.o. female.  57 year old female with history of chronic back pain, status post multiple spine surgeries, presents with 1 month history of worsening lower back pain radiating down to her left leg.  Patient is in pain management and takes daily morphine and oxycodone.  Pain is dull and worse with any movement.  Denies any saddle anesthesias.  No loss of bowel or bladder function.  No recent history of trauma.  Does have a spine surgeon who wants her to have an MRI prior to any of the procedures.  She has been unable to have this done.  Denies any foot drop.  Does have a wheelchair as well as walker at home.  Patient states that she is mostly here to figure out the etiology of her symptoms.  Has been having increasing back spasms.  Does not request any analgesics at this time.     Past Medical History:  Diagnosis Date  . Anxiety   . COPD (chronic obstructive pulmonary disease) (HCC)   . Depression   . Fibromyalgia   . Hypertension   . Hypertension   . Manic depression (HCC)   . Neuropathy   . OSA (obstructive sleep apnea)     Patient Active Problem List   Diagnosis Date Noted  . Vocal cord dysfunction 04/25/2017  . PTSD (post-traumatic stress disorder) 04/25/2017  . 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 Acute respiratory failure with hypoxia (HCC) 04/22/2017  . Hypokalemia 04/22/2017  . Leukocytosis 04/22/2017  . Prolonged QT interval 04/22/2017  . CFIDS (chronic fatigue and immune dysfunction syndrome) (HCC) 08/20/2015  . Foot pain 08/20/2015  . Essential (primary) hypertension 08/20/2015  . COPD with acute exacerbation (HCC) 08/20/2015  . Obstructive apnea 08/20/2015  . Cervical osteoarthritis 08/20/2015  . Sleep disorder  08/20/2015  . Other specified postprocedural states 07/17/2015  . Borderline personality disorder (HCC) 06/11/2015    Past Surgical History:  Procedure Laterality Date  . ABDOMINAL SURGERY    . APPENDECTOMY    . BACK SURGERY    . BREAST REDUCTION SURGERY    . BREAST SURGERY    . CARPAL TUNNEL RELEASE Bilateral   . CESAREAN SECTION    . CHOLECYSTECTOMY    . HERNIA REPAIR    . JOINT REPLACEMENT    . KNEE SURGERY    . TONSILLECTOMY       OB History   None      Home Medications    Prior to Admission medications   Medication Sig Start Date End Date Taking? Authorizing Provider  acyclovir ointment (ZOVIRAX) 5 % Apply 1 application topically every 3 (three) hours as needed. Affected area on mouth 01/27/18   [provider]  albuterol (PROAIR HFA) 108 (90 Base) MCG/ACT inhaler Inhale 2 puffs into the lungs every 4 (four) hours as needed for wheezing. 06/30/17   [provider]  amLODipine (NORVASC) 5 MG tablet Take 10 mg by mouth daily.  07/05/17   [provider]  baclofen (LIORESAL) 10 MG tablet Take 10 mg by mouth 3 (three) times daily.  03/29/17   [provider]  benzonatate (TESSALON) 100 MG capsule Take 1 capsule (100 mg total) by mouth 3 (three) times daily as needed for cough. Patient  not taking: Reported on 08/13/2017 04/28/17   Narda Bonds, MD  budesonide-formoterol [facility]) 160-4.5 MCG/ACT inhaler Inhale 2 puffs into the lungs 2 (two) times daily.    [provider]  buPROPion (WELLBUTRIN XL) 300 MG 24 hr tablet Take 1 tablet (300 mg total) by mouth daily. 01/09/18   Thresa Ross, MD  busPIRone (BUSPAR) 10 MG tablet Take 1 tablet (10 mg total) by mouth 2 (two) times daily. 01/09/18   Thresa Ross, MD  busPIRone (BUSPAR) 10 MG tablet TAKE 1 TABLET(10 MG) BY MOUTH TWICE DAILY 02/23/18   Thresa Ross, MD  cetirizine (ZYRTEC) 10 MG tablet Take 10 mg by mouth daily.    [provider]  citalopram (CELEXA) 20 MG tablet  Take 1.5 tablets (30 mg total) by mouth daily. 01/09/18   Thresa Ross, MD  CLIMARA PRO 0.045-0.015 MG/DAY Apply 0.045 mg topically once a week. 02/02/18   [provider]  cyclobenzaprine (FLEXERIL) 10 MG tablet Take 1 tablet (10 mg total) by mouth 2 (two) times daily as needed for muscle spasms. 09/06/17   Alvira Monday, MD  dextromethorphan-guaiFENesin (MUCINEX DM) 30-600 MG 12hr tablet Take 1 tablet by mouth 2 (two) times daily. Patient not taking: Reported on 01/09/2018 04/28/17   Narda Bonds, MD  gabapentin (NEURONTIN) 100 MG capsule Take 2 capsules (200 mg total) by mouth 3 (three) times daily. Patient taking differently: Take 1,800 mg by mouth at bedtime.  07/31/17 02/10/18  Thresa Ross, MD  hydrOXYzine (ATARAX/VISTARIL) 25 MG tablet Take 1 tablet (25 mg total) by mouth every 6 (six) hours as needed for anxiety (mild anxiety). 07/24/17   Shaune Pollack, MD  ibuprofen (ADVIL,MOTRIN) 200 MG tablet Take 800 mg by mouth every 6 (six) hours as needed for headache.    [provider]  Immune Globulin, Human, (GAMMAGARD IV) Inject 10 % into the vein every 30 (thirty) days.    [provider]  ipratropium-albuterol (DUONEB) 0.5-2.5 (3) MG/3ML SOLN Inhale 3 mLs into the lungs every 4 (four) hours as needed for wheezing or shortness of breath. 04/15/17   [provider]  ketotifen (ZADITOR) 0.025 % ophthalmic solution Place 1 drop into both eyes at bedtime as needed.    [provider]  LORazepam (ATIVAN) 1 MG tablet Take 1 tablet (1 mg total) by mouth 3 (three) times daily as needed for anxiety (or nausea). 07/24/17   Shaune Pollack, MD  losartan (COZAAR) 100 MG tablet Take 100 mg by mouth daily.    [provider]  morphine (MS CONTIN) 30 MG 12 hr tablet Take 1 tablet (30 mg total) by mouth every 12 (twelve) hours. 04/28/17   Narda Bonds, MD  nicotine (NICODERM CQ - DOSED IN MG/24 HR) 7 mg/24hr patch Place 1 patch (7 mg total) onto the skin  daily. Patient not taking: Reported on 08/24/2017 04/29/17   Narda Bonds, MD  NYSTATIN powder Apply 1 application topically 2 (two) times daily as needed. Under the breast 05/23/17   [provider]  OLANZapine zydis (ZYPREXA) 5 MG disintegrating tablet DISSOLVE 1 TABLET(5 MG) ON THE TONGUE AT BEDTIME 02/22/18   Thresa Ross, MD  OLANZapine zydis (ZYPREXA) 5 MG disintegrating tablet Take 1 tablet (5 mg total) by mouth at bedtime. 01/09/18   Thresa Ross, MD  oxyCODONE-acetaminophen (PERCOCET) 10-325 MG tablet Take 1 tablet by mouth every 6 (six) hours as needed for pain. Hold for sedation     [provider]  polyethylene glycol (MIRALAX /  GLYCOLAX) packet Take 17 g by mouth daily. Patient not taking: Reported on 01/09/2018 04/28/17   Narda Bonds, MD  predniSONE (DELTASONE) 20 MG tablet 2 tabs po daily x 4 days 02/10/18   Rolan Bucco, MD  promethazine (PHENERGAN) 25 MG tablet Take 25 mg by mouth every 8 (eight) hours as needed for nausea/vomiting.  04/15/17   [provider]  amphetamine-dextroamphetamine (ADDERALL) 30 MG tablet Take 30 mg by mouth 2 (two) times daily.  10/02/15  [provider]  FLUoxetine (PROZAC) 40 MG capsule Take 1 capsule (40 mg total) by mouth daily. 10/02/15 01/10/17  Thresa Ross, MD  pregabalin (LYRICA) 225 MG capsule Take 225 mg by mouth 2 (two) times daily.  01/10/17  [provider]    Family History Family History  Problem Relation Age of Onset  . Alcohol abuse Mother   . Alcohol abuse Father   . Depression Sister   . Dementia Neg Hx     Social History Social History   Tobacco Use  . Smoking status: Former Smoker    Packs/day: 1.00    Types: Cigarettes    Last attempt to quit: 01/17/2017    Years since quitting: 1.1  . Smokeless tobacco: Never Used  Substance Use Topics  . Alcohol use: No    Comment: last use 2-3 years ago  . Drug use: No     Allergies   Ambien [zolpidem tartrate]; Beta adrenergic  blockers; Fish allergy; Iodinated diagnostic agents; Latex; Metoprolol; Penicillins; Red dye; Shellfish allergy; Sulfa antibiotics; Amoxicillin-pot clavulanate; Aspirin; Dye fdc red  [amaranth (fd&c red #2)]; Other; Tape; Talwin [pentazocine]; Levofloxacin; and Seroquel [quetiapine fumarate]   Review of Systems Review of Systems  All other systems reviewed and are negative.    Physical Exam Updated Vital Signs BP 133/80 (BP Location: Right Arm)   Pulse 80   Temp 98.1 F (36.7 C) (Oral)   Resp 20   Ht 1.676 m (5\' 6" )   Wt 112.5 kg (248 lb)   SpO2 99%   BMI 40.03 kg/m   Physical Exam  Constitutional: She is oriented to person, place, and time. She appears well-developed and well-nourished.  Non-toxic appearance. No distress.  HENT:  Head: Normocephalic and atraumatic.  Eyes: Pupils are equal, round, and reactive to [place]. Conjunctivae, EOM and lids are normal.  Neck: Normal range of motion. Neck supple. No tracheal deviation present. No thyroid mass present.  Cardiovascular: Normal rate, regular rhythm and normal heart sounds. Exam reveals no gallop.  No murmur heard. Pulmonary/Chest: Effort normal and breath sounds normal. No stridor. No respiratory distress. She has no decreased breath sounds. She has no wheezes. She has no rhonchi. She has no rales.  Abdominal: Soft. Normal appearance and bowel sounds are normal. She exhibits no distension. There is no tenderness. There is no rebound and no CVA tenderness.  Musculoskeletal: Normal range of motion. She exhibits no edema or tenderness.       Back:  Neurological: She is alert and oriented to person, place, and time. She has normal strength. She displays no tremor. No cranial nerve deficit or sensory deficit. She exhibits normal muscle tone. GCS eye subscore is 4. GCS verbal subscore is 5. GCS motor subscore is 6.  Reflex Scores:      Patellar reflexes are 2+ on the right side and 2+ on the left side. Right lower extremity strength  5/5.  Left lower extremity normal.  Skin: Skin is warm and dry. No abrasion and no  rash noted.  Psychiatric: She has a normal mood and affect. Her speech is normal and behavior is normal.  Nursing note and vitals reviewed.    ED Treatments / Results  Labs (all labs ordered are listed, but only abnormal results are displayed) Labs Reviewed - No data to display  EKG None  Radiology No results found.  Procedures Procedures (including critical care time)  Medications Ordered in ED Medications  diazepam (VALIUM) tablet 10 mg (has no administration in time range)     Initial Impression / Assessment and Plan / ED Course  I have reviewed the triage vital signs and the nursing notes.  Pertinent labs & imaging results that were available during my care of the patient were reviewed by me and considered in my medical decision making (see chart for details).     Patient given Valium here orally and will be given prescription for same.  Suspect the etiology of patient's symptoms is from her chronic pain.  No red flags for cauda equina.  Encouraged to follow with her pain management specialist as well as her spine surgeon  Final Clinical Impressions(s) / ED Diagnoses   Final diagnoses:  None    ED Discharge Orders    None       Lorre NickAllen, Lulamae Skorupski, MD 03/02/18 1647    Lorre NickAllen, Rojelio Uhrich, MD 03/02/18 1706

## 2018-03-02 NOTE — ED Triage Notes (Signed)
Pt reports hx of back surgery x 4. For two weeks now having an increase in pain/ weakness to right leg and difficulty ambulating.

## 2018-05-01 ENCOUNTER — Other Ambulatory Visit (HOSPITAL_COMMUNITY): Payer: Self-pay | Admitting: Psychiatry

## 2018-05-10 IMAGING — CR DG CHEST 2V
2 series · 2 of 2 positions shown · non-contrast
Comparison: 09/27/2017

CLINICAL DATA: pt states that wheezing started this past week.
Cough productive for green thick sputum. Patient reports she has
been feeling faint.

EXAM:
CHEST  2 VIEW

[chest lat]
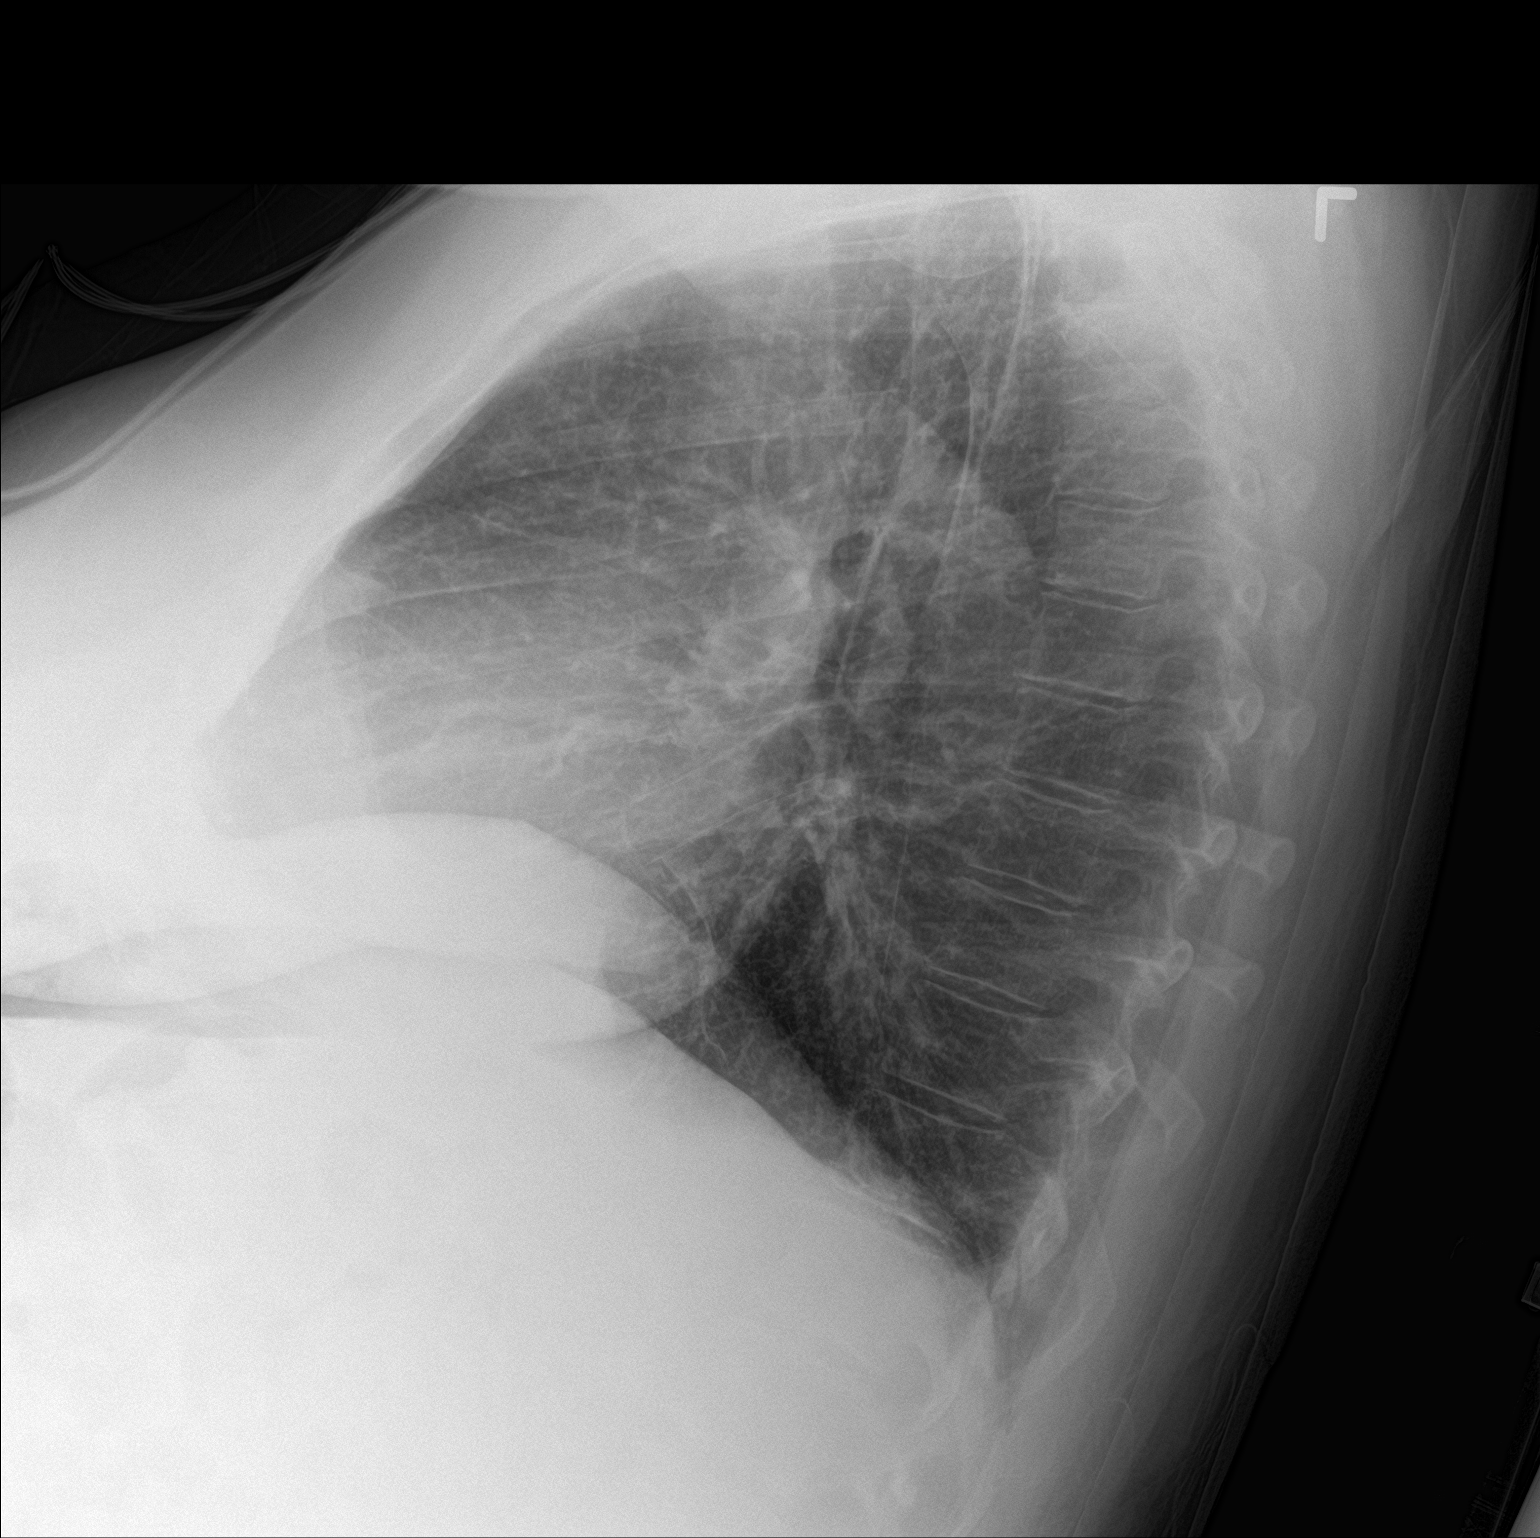

[chest ap]
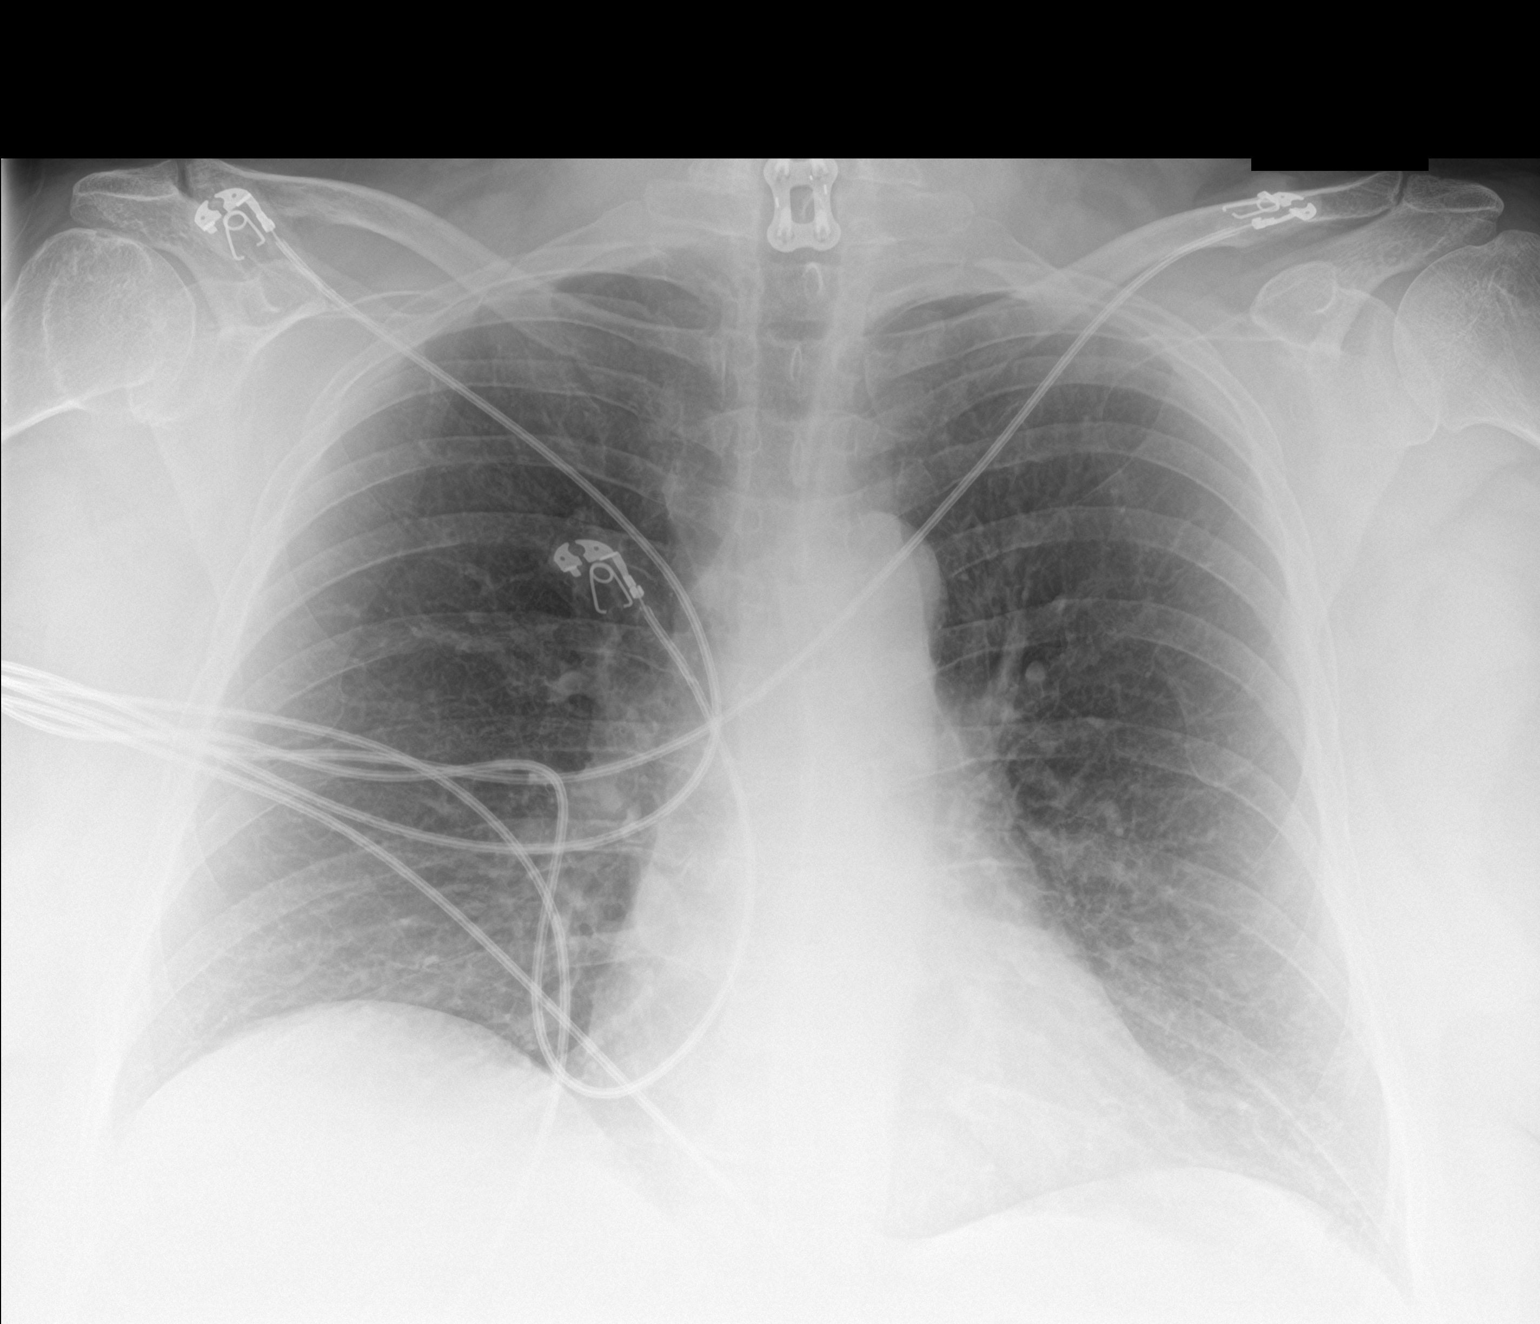

[2 of 2 positions shown; findings below may reference images not displayed]

FINDINGS: Lower cervical plate and screw fixator. Mild subsegmental
atelectasis in the lower lobes. The lungs appear otherwise clear.
Cardiac and mediastinal margins appear normal. No pleural effusion.
IMPRESSION: 1. Minimal bibasilar subsegmental atelectasis or scarring.
Otherwise, no significant abnormalities are observed.

## 2018-05-17 ENCOUNTER — Encounter (HOSPITAL_COMMUNITY): Payer: Self-pay | Admitting: Emergency Medicine

## 2018-05-17 ENCOUNTER — Emergency Department (HOSPITAL_COMMUNITY)
Admission: EM | Admit: 2018-05-17 | Discharge: 2018-05-17 | Disposition: A | Discharge status: Home / Self Care | Payer: Medicare Other | Attending: Emergency Medicine | Admitting: Emergency Medicine

## 2018-05-17 ENCOUNTER — Emergency Department (HOSPITAL_COMMUNITY): Payer: Medicare Other

## 2018-05-17 DIAGNOSIS — I10 Essential (primary) hypertension: Secondary | ICD-10-CM | POA: Insufficient documentation

## 2018-05-17 DIAGNOSIS — Z79899 Other long term (current) drug therapy: Secondary | ICD-10-CM | POA: Diagnosis not present

## 2018-05-17 DIAGNOSIS — Z87891 Personal history of nicotine dependence: Secondary | ICD-10-CM | POA: Insufficient documentation

## 2018-05-17 DIAGNOSIS — R079 Chest pain, unspecified: Secondary | ICD-10-CM | POA: Insufficient documentation

## 2018-05-17 DIAGNOSIS — J449 Chronic obstructive pulmonary disease, unspecified: Secondary | ICD-10-CM | POA: Insufficient documentation

## 2018-05-17 DIAGNOSIS — R569 Unspecified convulsions: Secondary | ICD-10-CM

## 2018-05-17 LAB — BASIC METABOLIC PANEL
ANION GAP: 10 (ref 5–15)
BUN: 8 mg/dL (ref 6–20)
CO2: 32 mmol/L (ref 22–32)
Calcium: 9 mg/dL (ref 8.9–10.3)
Chloride: 100 mmol/L — ABNORMAL LOW (ref 101–111)
Creatinine, Ser: 1.27 mg/dL — ABNORMAL HIGH (ref 0.44–1.00)
GFR, EST AFRICAN AMERICAN: 54 mL/min — AB (ref >60)
GFR, EST NON AFRICAN AMERICAN: 47 mL/min — AB (ref >60)
Glucose, Bld: 133 mg/dL — ABNORMAL HIGH (ref 65–99)
Potassium: 3.2 mmol/L — ABNORMAL LOW (ref 3.5–5.1)
SODIUM: 142 mmol/L (ref 135–145)

## 2018-05-17 LAB — CBC
HCT: 43.9 % (ref 36.0–46.0)
HEMOGLOBIN: 14 g/dL (ref 12.0–15.0)
MCH: 27.5 pg (ref 26.0–34.0)
MCHC: 31.9 g/dL (ref 30.0–36.0)
MCV: 86.2 fL (ref 78.0–100.0)
Platelets: 398 10*3/uL (ref 150–400)
RBC: 5.09 MIL/uL (ref 3.87–5.11)
RDW: 13.8 % (ref 11.5–15.5)
WBC: 8.6 10*3/uL (ref 4.0–10.5)

## 2018-05-17 LAB — I-STAT TROPONIN, ED
TROPONIN I, POC: 0 ng/mL (ref 0.00–0.08)
TROPONIN I, POC: 0 ng/mL (ref 0.00–0.08)

## 2018-05-17 LAB — I-STAT BETA HCG BLOOD, ED (MC, WL, AP ONLY): I-stat hCG, quantitative: <5 m[IU]/mL (ref <5)

## 2018-05-17 MED ORDER — POTASSIUM CHLORIDE CRYS ER 20 MEQ PO TBCR
20.0000 meq | EXTENDED_RELEASE_TABLET | Freq: Once | ORAL | Status: AC
  Administered 2018-05-17: 20 meq via ORAL
  Filled 2018-05-17: qty 1

## 2018-05-17 MED ORDER — OXYCODONE-ACETAMINOPHEN 5-325 MG PO TABS
2.0000 | ORAL_TABLET | Freq: Once | ORAL | Status: AC
  Administered 2018-05-17: 2 via ORAL
  Filled 2018-05-17: qty 2

## 2018-05-17 NOTE — Discharge Instructions (Signed)
Your evaluated in the emergency department for chest pain.  You had blood work EKG chest x-ray that did not show an obvious cause of your symptoms.  You also complained of possibly having a seizure 2 days ago.  Your CAT scan was negative for any acute findings.  Both of these problems will need to be followed up by you with your primary care doctor.  You should continue your regular medications and return if any worsening symptoms.

## 2018-05-17 NOTE — ED Triage Notes (Signed)
Pt arrives via EMS. Complains of central CP 8/10 sharp- radiates to L arm. 324mg  asa, 1nitro. No improvement with pain. BP 170/110 initially- then 120/78 after nitro. CBG 144. Pt reports a lot of stress in her life to EMS. Pt wears home O2 as needed.

## 2018-05-17 NOTE — ED Provider Notes (Signed)
[facility] EMERGENCY DEPARTMENT Provider Note   CSN: [id number] Arrival date & time: 05/17/18  0946     History   Chief Complaint Chief Complaint  Patient presents with  . Chest Pain    HPI Deanna Yang is a 57 y.o. female.  She has no prior history of any coronary disease.  She is complaining of acute onset of 8 out of 10 chest pain at around 8 this morning.  Said it radiated to her neck and her left shoulder and arm.  She says she is had anxiety before but it seems like a different pain than that.  EMS gave her aspirin and nitro and she said her symptoms were improving.  Currently she states her pain is 4 out of 10 and she feels much more comfortable.  She also relates that she had her first time seizure 2 or 3 days ago.  She said she was in bed and her family states her eyes were rolled back in her head and she was shaking all over.  Is never happened before but she did not follow-up with anybody about it.  She denies any street drugs and states she has been taking her medicines regular.  She does admit to increased depression and anxiety.  The history is provided by the patient.  Chest Pain   This is a new problem. The current episode started 6 to 12 hours ago. The problem has been gradually improving. The pain is associated with rest. The pain is present in the substernal region. The pain is at a severity of 8/10. The quality of the pain is described as pressure-like. The pain radiates to the left jaw, left shoulder, left neck and upper back. Associated symptoms include back pain, diaphoresis, malaise/fatigue and shortness of breath. Pertinent negatives include no abdominal pain, no cough, no fever, no headaches, no hemoptysis, no leg pain, no nausea, no numbness, no palpitations, no sputum production, no syncope and no vomiting. She has tried nitroglycerin for the symptoms. The treatment provided moderate relief. Risk factors include post-menopausal.  Her past medical  history is significant for anxiety/panic attacks, COPD, hyperlipidemia, hypertension and seizures.    Past Medical History:  Diagnosis Date  . Anxiety   . COPD (chronic obstructive pulmonary disease) (HCC)   . Depression   . Fibromyalgia   . Hypertension   . Hypertension   . Manic depression (HCC)   . Neuropathy   . OSA (obstructive sleep apnea)     Patient Active Problem List   Diagnosis Date Noted  . Vocal cord dysfunction 04/25/2017  . PTSD (post-traumatic stress disorder) 04/25/2017  . 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 Acute respiratory failure with hypoxia (HCC) 04/22/2017  . Hypokalemia 04/22/2017  . Leukocytosis 04/22/2017  . Prolonged QT interval 04/22/2017  . CFIDS (chronic fatigue and immune dysfunction syndrome) (HCC) 08/20/2015  . Foot pain 08/20/2015  . Essential (primary) hypertension 08/20/2015  . COPD with acute exacerbation (HCC) 08/20/2015  . Obstructive apnea 08/20/2015  . Cervical osteoarthritis 08/20/2015  . Sleep disorder 08/20/2015  . Other specified postprocedural states 07/17/2015  . Borderline personality disorder (HCC) 06/11/2015    Past Surgical History:  Procedure Laterality Date  . ABDOMINAL SURGERY    . APPENDECTOMY    . BACK SURGERY    . BREAST REDUCTION SURGERY    . BREAST SURGERY    . CARPAL TUNNEL RELEASE Bilateral   . CESAREAN SECTION    . CHOLECYSTECTOMY    . HERNIA  REPAIR    . JOINT REPLACEMENT    . KNEE SURGERY    . TONSILLECTOMY       OB History   None      Home Medications    Prior to Admission medications   Medication Sig Start Date End Date Taking? Authorizing Provider  acyclovir ointment (ZOVIRAX) 5 % Apply 1 application topically every 3 (three) hours as needed. Affected area on mouth 01/27/18   [provider]  albuterol (PROAIR HFA) 108 (90 Base) MCG/ACT inhaler Inhale 2 puffs into the lungs every 4 (four) hours as needed for wheezing. 06/30/17   [provider]  amLODipine  (NORVASC) 5 MG tablet Take 10 mg by mouth daily.  07/05/17   [provider]  baclofen (LIORESAL) 10 MG tablet Take 10 mg by mouth 3 (three) times daily.  03/29/17   [provider]  benzonatate (TESSALON) 100 MG capsule Take 1 capsule (100 mg total) by mouth 3 (three) times daily as needed for cough. Patient not taking: Reported on 08/13/2017 04/28/17   Narda BondsNettey, Ralph A, MD  budesonide-formoterol [facility](SYMBICORT) 160-4.5 MCG/ACT inhaler Inhale 2 puffs into the lungs 2 (two) times daily.    [provider]  buPROPion (WELLBUTRIN XL) 300 MG 24 hr tablet Take 1 tablet (300 mg total) by mouth daily. 01/09/18   Thresa RossAkhtar, Nadeem, MD  busPIRone (BUSPAR) 10 MG tablet Take 1 tablet (10 mg total) by mouth 2 (two) times daily. 01/09/18   Thresa RossAkhtar, Nadeem, MD  busPIRone (BUSPAR) 10 MG tablet TAKE 1 TABLET(10 MG) BY MOUTH TWICE DAILY 02/23/18   Thresa RossAkhtar, Nadeem, MD  cetirizine (ZYRTEC) 10 MG tablet Take 10 mg by mouth daily.    [provider]  citalopram (CELEXA) 20 MG tablet Take 1.5 tablets (30 mg total) by mouth daily. 01/09/18   Thresa RossAkhtar, Nadeem, MD  CLIMARA PRO 0.045-0.015 MG/DAY Apply 0.045 mg topically once a week. 02/02/18   [provider]  cyclobenzaprine (FLEXERIL) 10 MG tablet Take 1 tablet (10 mg total) by mouth 2 (two) times daily as needed for muscle spasms. 09/06/17   Alvira MondaySchlossman, Erin, MD  dextromethorphan-guaiFENesin (MUCINEX DM) 30-600 MG 12hr tablet Take 1 tablet by mouth 2 (two) times daily. Patient not taking: Reported on 01/09/2018 04/28/17   Narda BondsNettey, Ralph A, MD  diazepam (VALIUM) 2 MG tablet Take 1 tablet (2 mg total) by mouth every 4 (four) hours as needed for muscle spasms. 03/02/18   Lorre NickAllen, Anthony, MD  gabapentin (NEURONTIN) 100 MG capsule Take 2 capsules (200 mg total) by mouth 3 (three) times daily. Patient taking differently: Take 1,800 mg by mouth at bedtime.  07/31/17 02/10/18  Thresa RossAkhtar, Nadeem, MD  hydrOXYzine (ATARAX/VISTARIL) 25 MG tablet Take 1 tablet (25 mg  total) by mouth every 6 (six) hours as needed for anxiety (mild anxiety). 07/24/17   Shaune PollackIsaacs, Cameron, MD  ibuprofen (ADVIL,MOTRIN) 200 MG tablet Take 800 mg by mouth every 6 (six) hours as needed for headache.    [provider]  Immune Globulin, Human, (GAMMAGARD IV) Inject 10 % into the vein every 30 (thirty) days.    [provider]  ipratropium-albuterol (DUONEB) 0.5-2.5 (3) MG/3ML SOLN Inhale 3 mLs into the lungs every 4 (four) hours as needed for wheezing or shortness of breath. 04/15/17   [provider]  ketotifen (ZADITOR) 0.025 % ophthalmic solution Place 1 drop into both eyes at bedtime as needed.    [provider]  LORazepam (ATIVAN) 1 MG tablet Take 1 tablet (1 mg total)  by mouth 3 (three) times daily as needed for anxiety (or nausea). 07/24/17   Shaune Pollack, MD  losartan (COZAAR) 100 MG tablet Take 100 mg by mouth daily.    [provider]  morphine (MS CONTIN) 30 MG 12 hr tablet Take 1 tablet (30 mg total) by mouth every 12 (twelve) hours. 04/28/17   Narda Bonds, MD  nicotine (NICODERM CQ - DOSED IN MG/24 HR) 7 mg/24hr patch Place 1 patch (7 mg total) onto the skin daily. Patient not taking: Reported on 08/24/2017 04/29/17   Narda Bonds, MD  NYSTATIN powder Apply 1 application topically 2 (two) times daily as needed. Under the breast 05/23/17   [provider]  OLANZapine zydis (ZYPREXA) 5 MG disintegrating tablet DISSOLVE 1 TABLET(5 MG) ON THE TONGUE AT BEDTIME 02/22/18   Thresa Ross, MD  OLANZapine zydis (ZYPREXA) 5 MG disintegrating tablet Take 1 tablet (5 mg total) by mouth at bedtime. 01/09/18   Thresa Ross, MD  oxyCODONE-acetaminophen (PERCOCET) 10-325 MG tablet Take 1 tablet by mouth every 6 (six) hours as needed for pain. Hold for sedation     [provider]  polyethylene glycol (MIRALAX / GLYCOLAX) packet Take 17 g by mouth daily. Patient not taking: Reported on 01/09/2018 04/28/17   Narda Bonds, MD    predniSONE (DELTASONE) 20 MG tablet 2 tabs po daily x 4 days 02/10/18   Rolan Bucco, MD  promethazine (PHENERGAN) 25 MG tablet Take 25 mg by mouth every 8 (eight) hours as needed for nausea/vomiting.  04/15/17   [provider]  amphetamine-dextroamphetamine (ADDERALL) 30 MG tablet Take 30 mg by mouth 2 (two) times daily.  10/02/15  [provider]  FLUoxetine (PROZAC) 40 MG capsule Take 1 capsule (40 mg total) by mouth daily. 10/02/15 01/10/17  Thresa Ross, MD  pregabalin (LYRICA) 225 MG capsule Take 225 mg by mouth 2 (two) times daily.  01/10/17  [provider]    Family History Family History  Problem Relation Age of Onset  . Alcohol abuse Mother   . Alcohol abuse Father   . Depression Sister   . Dementia Neg Hx     Social History Social History   Tobacco Use  . Smoking status: Former Smoker    Packs/day: 1.00    Types: Cigarettes    Last attempt to quit: 01/17/2017    Years since quitting: 1.3  . Smokeless tobacco: Never Used  Substance Use Topics  . Alcohol use: No    Comment: last use 2-3 years ago  . Drug use: No     Allergies   Ambien [zolpidem tartrate]; Beta adrenergic blockers; Fish allergy; Iodinated diagnostic agents; Latex; Metoprolol; Penicillins; Red dye; Shellfish allergy; Sulfa antibiotics; Amoxicillin-pot clavulanate; Aspirin; Dye fdc red  [amaranth (fd&c red #2)]; Other; Tape; Talwin [pentazocine]; Levofloxacin; and Seroquel [quetiapine fumarate]   Review of Systems Review of Systems  Constitutional: Positive for diaphoresis and malaise/fatigue. Negative for chills and fever.  HENT: Negative for ear pain and sore throat.   Eyes: Negative for pain and visual disturbance.  Respiratory: Positive for shortness of breath. Negative for cough, hemoptysis and sputum production.   Cardiovascular: Positive for chest pain. Negative for palpitations and syncope.  Gastrointestinal: Negative for abdominal pain, nausea and vomiting.   Genitourinary: Negative for dysuria and hematuria.  Musculoskeletal: Positive for back pain and neck pain. Negative for arthralgias.  Skin: Negative for color change and rash.  Neurological: Positive for seizures. Negative for syncope, numbness and headaches.  Psychiatric/Behavioral: Positive for dysphoric mood. The patient is nervous/anxious.   All other systems reviewed and are negative.    Physical Exam Updated Vital Signs BP 113/86 (BP Location: Left Arm)   Pulse 72   Temp 97.6 F (36.4 C) (Oral)   Resp 18   Ht 5\' 6"  (1.676 m)   Wt 106.6 kg (235 lb)   SpO2 93%   BMI 37.93 kg/m   Physical Exam  Constitutional: She appears well-developed and well-nourished. No distress.  HENT:  Head: Normocephalic and atraumatic.  Eyes: Pupils are equal, round, and reactive to Ornstein. Conjunctivae and EOM are normal.  Neck: Normal range of motion. Neck supple.  Cardiovascular: Normal rate, regular rhythm and normal pulses.  No murmur heard. Pulmonary/Chest: Effort normal and breath sounds normal. No respiratory distress.  Abdominal: Soft. There is no tenderness.  Musculoskeletal: Normal range of motion. She exhibits no edema.       Right lower leg: She exhibits no tenderness and no edema.       Left lower leg: She exhibits no tenderness and no edema.  Neurological: She is alert. She has normal strength. No cranial nerve deficit or sensory deficit. GCS eye subscore is 4. GCS verbal subscore is 5. GCS motor subscore is 6.  Skin: Skin is warm and dry. Capillary refill takes less than 2 seconds.  Psychiatric: She has a normal mood and affect.  Nursing note and vitals reviewed.    ED Treatments / Results  Labs (all labs ordered are listed, but only abnormal results are displayed) Labs Reviewed  BASIC METABOLIC PANEL - Abnormal; Notable for the following components:      Result Value   Potassium 3.2 (*)    Chloride 100 (*)    Glucose, Bld 133 (*)    Creatinine, Ser 1.27 (*)    GFR  calc non Af Amer 47 (*)    GFR calc Af Amer 54 (*)    All other components within normal limits  CBC  I-STAT TROPONIN, ED  I-STAT BETA HCG BLOOD, ED ([facility], [facility], [facility] ONLY)  I-STAT TROPONIN, ED    EKG EKG Interpretation  Date/Time:  Thursday May 17 2018 09:55:42 EDT Ventricular Rate:  93 PR Interval:  134 QRS Duration: 78 QT Interval:  356 QTC Calculation: 442 R Axis:   30 Text Interpretation:  Normal sinus rhythm Low voltage QRS Nonspecific ST and T wave abnormality Abnormal ECG similar to prior 3/19 Confirmed by Meridee Score [id number]) on 05/17/2018 3:39:04 PM   Radiology Dg Chest 2 View  Result Date: 05/17/2018 CLINICAL DATA:  Chest pain and shortness of breath EXAM: CHEST - 2 VIEW COMPARISON:  February 10, 2018 FINDINGS: There is mild atelectatic change in the left base. The lungs elsewhere are clear. The heart size and pulmonary vascularity are normal. No adenopathy. There is postoperative change in the lower cervical region. IMPRESSION: Slight left base atelectasis. No edema or consolidation. Stable cardiac silhouette. Electronically Signed   By: Bretta Bang III M.D.   On: 05/17/2018 10:27    Procedures Procedures (including critical care time)  Medications Ordered in ED Medications  potassium chloride SA (K-DUR,KLOR-CON) CR tablet 20 mEq (has no administration in time range)     Initial Impression / Assessment and Plan / ED Course  I have reviewed the triage vital signs and the nursing notes.  Pertinent labs & imaging results that were available during my care of the patient were reviewed by me and considered in my medical decision making (  see chart for details).  Clinical Course as of May 19 1158  Thu May 17, 2018  1603 Patient has no prior cardiac history but multiple risk factors here with chest pain occurring at rest.  Her first troponin is negative her EKG is nonspecific.  I have ordered a second troponin which is pending.  She also had what sounds like a new onset  seizure 2 or 3 days ago.  Her neuro exam is normal here but I will order a head CT as part of her work-up.  So far her labs were only significant for a mild bump in her creatinine and a slightly low potassium 3.2 which aborted repletion for   [MB]  1851 Went back to reevaluate patient and tell her the results of her second troponin being negative and a head CT being negative.  She is complaining of worsening of her headache and also being due for her chronic pain medicine which is oxycodone 10/325.  I will order this for her and she will be discharged to follow-up with her primary care doctor.   [MB]    Clinical Course User Index [MB] Terrilee Files, MD     Final Clinical Impressions(s) / ED Diagnoses   Final diagnoses:  Nonspecific chest pain  Seizure-like activity [facility])    ED Discharge Orders    None       Terrilee Files, MD 05/19/18 1159

## 2018-06-25 ENCOUNTER — Other Ambulatory Visit (HOSPITAL_COMMUNITY): Payer: Self-pay | Admitting: Psychiatry

## 2018-07-13 ENCOUNTER — Encounter (HOSPITAL_COMMUNITY): Payer: Self-pay | Admitting: Emergency Medicine

## 2018-07-13 ENCOUNTER — Emergency Department (HOSPITAL_COMMUNITY): Payer: Medicare Other

## 2018-07-13 ENCOUNTER — Emergency Department (HOSPITAL_COMMUNITY)
Admission: EM | Admit: 2018-07-13 | Discharge: 2018-07-13 | Disposition: A | Discharge status: Home / Self Care | Payer: Medicare Other | Attending: Emergency Medicine | Admitting: Emergency Medicine

## 2018-07-13 ENCOUNTER — Other Ambulatory Visit: Payer: Self-pay

## 2018-07-13 DIAGNOSIS — Y939 Activity, unspecified: Secondary | ICD-10-CM | POA: Diagnosis not present

## 2018-07-13 DIAGNOSIS — J449 Chronic obstructive pulmonary disease, unspecified: Secondary | ICD-10-CM | POA: Insufficient documentation

## 2018-07-13 DIAGNOSIS — Z87891 Personal history of nicotine dependence: Secondary | ICD-10-CM | POA: Diagnosis not present

## 2018-07-13 DIAGNOSIS — Y998 Other external cause status: Secondary | ICD-10-CM | POA: Diagnosis not present

## 2018-07-13 DIAGNOSIS — Z91040 Latex allergy status: Secondary | ICD-10-CM | POA: Diagnosis not present

## 2018-07-13 DIAGNOSIS — R062 Wheezing: Secondary | ICD-10-CM | POA: Diagnosis not present

## 2018-07-13 DIAGNOSIS — I10 Essential (primary) hypertension: Secondary | ICD-10-CM | POA: Insufficient documentation

## 2018-07-13 DIAGNOSIS — F329 Major depressive disorder, single episode, unspecified: Secondary | ICD-10-CM | POA: Diagnosis present

## 2018-07-13 DIAGNOSIS — M25561 Pain in right knee: Secondary | ICD-10-CM | POA: Diagnosis not present

## 2018-07-13 DIAGNOSIS — R21 Rash and other nonspecific skin eruption: Secondary | ICD-10-CM | POA: Diagnosis not present

## 2018-07-13 DIAGNOSIS — W19XXXA Unspecified fall, initial encounter: Secondary | ICD-10-CM | POA: Diagnosis not present

## 2018-07-13 DIAGNOSIS — Y929 Unspecified place or not applicable: Secondary | ICD-10-CM | POA: Diagnosis not present

## 2018-07-13 DIAGNOSIS — Z79899 Other long term (current) drug therapy: Secondary | ICD-10-CM | POA: Insufficient documentation

## 2018-07-13 LAB — CBC
HEMATOCRIT: 42.5 % (ref 36.0–46.0)
HEMOGLOBIN: 13.7 g/dL (ref 12.0–15.0)
MCH: 28.5 pg (ref 26.0–34.0)
MCHC: 32.2 g/dL (ref 30.0–36.0)
MCV: 88.4 fL (ref 78.0–100.0)
Platelets: 395 10*3/uL (ref 150–400)
RBC: 4.81 MIL/uL (ref 3.87–5.11)
RDW: 13.2 % (ref 11.5–15.5)
WBC: 6.2 10*3/uL (ref 4.0–10.5)

## 2018-07-13 LAB — COMPREHENSIVE METABOLIC PANEL
ALT: 40 U/L (ref 0–44)
ANION GAP: 10 (ref 5–15)
AST: 40 U/L (ref 15–41)
Albumin: 3.7 g/dL (ref 3.5–5.0)
Alkaline Phosphatase: 83 U/L (ref 38–126)
BUN: 7 mg/dL (ref 6–20)
CHLORIDE: 101 mmol/L (ref 98–111)
CO2: 29 mmol/L (ref 22–32)
Calcium: 9.3 mg/dL (ref 8.9–10.3)
Creatinine, Ser: 1.46 mg/dL — ABNORMAL HIGH (ref 0.44–1.00)
GFR calc Af Amer: 46 mL/min — ABNORMAL LOW (ref >60)
GFR, EST NON AFRICAN AMERICAN: 39 mL/min — AB (ref >60)
GLUCOSE: 160 mg/dL — AB (ref 70–99)
POTASSIUM: 3.4 mmol/L — AB (ref 3.5–5.1)
Sodium: 140 mmol/L (ref 135–145)
Total Bilirubin: 0.9 mg/dL (ref 0.3–1.2)
Total Protein: 6.7 g/dL (ref 6.5–8.1)

## 2018-07-13 LAB — RAPID URINE DRUG SCREEN, HOSP PERFORMED
AMPHETAMINES: NOT DETECTED
BARBITURATES: NOT DETECTED
Benzodiazepines: NOT DETECTED
COCAINE: NOT DETECTED
Opiates: POSITIVE — AB
TETRAHYDROCANNABINOL: NOT DETECTED

## 2018-07-13 LAB — ACETAMINOPHEN LEVEL: Acetaminophen (Tylenol), Serum: <10 ug/mL — ABNORMAL LOW (ref 10–30)

## 2018-07-13 LAB — ETHANOL

## 2018-07-13 LAB — SALICYLATE LEVEL: Salicylate Lvl: <7 mg/dL (ref 2.8–30.0)

## 2018-07-13 MED ORDER — IPRATROPIUM-ALBUTEROL 0.5-2.5 (3) MG/3ML IN SOLN
3.0000 mL | Freq: Once | RESPIRATORY_TRACT | Status: AC
  Administered 2018-07-13: 3 mL via RESPIRATORY_TRACT
  Filled 2018-07-13: qty 3

## 2018-07-13 MED ORDER — METHYLPREDNISOLONE SODIUM SUCC 125 MG IJ SOLR: 125.0000 mg | Freq: Once | INTRAMUSCULAR | Status: DC

## 2018-07-13 MED ORDER — METHYLPREDNISOLONE SODIUM SUCC 125 MG IJ SOLR
125.0000 mg | Freq: Every day | INTRAMUSCULAR | Status: DC
  Administered 2018-07-13: 125 mg via INTRAVENOUS
  Filled 2018-07-13: qty 2

## 2018-07-13 NOTE — ED Provider Notes (Signed)
[facility] EMERGENCY DEPARTMENT Provider Note   CSN: [id number] Arrival date & time: 07/13/18  1246     History   Chief Complaint Chief Complaint  Patient presents with  . Psychiatric Evaluation    HPI Izetta Sakamoto is a 57 y.o. female.  57 y.o female with a PMH of COPD,HTN, Anxiety presents to the ED with a chief complaint of depression. Patient states her daughter is currently living on the streets and patient is constantly thinking and dreaming about her. She  States a week ago she started having visual and auditory hallucinations along with nightmares. She was then started on new medication which made her hallucinations worsen. She denies any SI or HI, denies any prior psychiatric hospitalizations. She also reports a rash to her BLUE which presented after she came in contact with a cat, she states her roommate had one and she is allergic to them. She states she is hurting everywhere all over her joints, ankles, wrists,knees. She denies any chest pain, shortness of breath or headache.      Past Medical History:  Diagnosis Date  . Anxiety   . COPD (chronic obstructive pulmonary disease) (HCC)   . Depression   . Fibromyalgia   . Hypertension   . Hypertension   . Manic depression (HCC)   . Neuropathy   . OSA (obstructive sleep apnea)     Patient Active Problem List   Diagnosis Date Noted  . Vocal cord dysfunction 04/25/2017  . PTSD (post-traumatic stress disorder) 04/25/2017  . 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 Acute respiratory failure with hypoxia (HCC) 04/22/2017  . Hypokalemia 04/22/2017  . Leukocytosis 04/22/2017  . Prolonged QT interval 04/22/2017  . CFIDS (chronic fatigue and immune dysfunction syndrome) (HCC) 08/20/2015  . Foot pain 08/20/2015  . Essential (primary) hypertension 08/20/2015  . COPD with acute exacerbation (HCC) 08/20/2015  . Obstructive apnea 08/20/2015  . Cervical osteoarthritis 08/20/2015  . Sleep disorder  08/20/2015  . Other specified postprocedural states 07/17/2015  . Borderline personality disorder (HCC) 06/11/2015    Past Surgical History:  Procedure Laterality Date  . ABDOMINAL SURGERY    . APPENDECTOMY    . BACK SURGERY    . BREAST REDUCTION SURGERY    . BREAST SURGERY    . CARPAL TUNNEL RELEASE Bilateral   . CESAREAN SECTION    . CHOLECYSTECTOMY    . HERNIA REPAIR    . JOINT REPLACEMENT    . KNEE SURGERY    . TONSILLECTOMY       OB History   None      Home Medications    Prior to Admission medications   Medication Sig Start Date End Date Taking? Authorizing Provider  acyclovir ointment (ZOVIRAX) 5 % Apply 1 application topically every 3 (three) hours as needed (mouth sores).  01/27/18  Yes [provider]  albuterol (PROAIR HFA) 108 (90 Base) MCG/ACT inhaler Inhale 2 puffs into the lungs every 4 (four) hours as needed for wheezing. 06/30/17  Yes [provider]  amLODipine (NORVASC) 10 MG tablet Take 10 mg by mouth daily.  07/05/17  Yes [provider]  budesonide-formoterol (SYMBICORT) 160-4.5 MCG/ACT inhaler Inhale 2 puffs into the lungs 2 (two) times daily.   Yes [provider]  buPROPion (WELLBUTRIN XL) 300 MG 24 hr tablet Take 1 tablet (300 mg total) by mouth daily. 01/09/18  Yes Thresa Ross, MD  busPIRone (BUSPAR) 15 MG tablet Take 15 mg by mouth 2 (two) times daily  as needed (panic attacks).  07/04/18  Yes [provider]  cetirizine (ZYRTEC) 10 MG tablet Take 10 mg by mouth daily.   Yes [provider]  citalopram (CELEXA) 40 MG tablet Take 40 mg by mouth daily. 06/18/18  Yes [provider]  fluticasone (FLONASE) 50 MCG/ACT nasal spray Place 1 spray into both nostrils 2 (two) times daily. 05/31/18  Yes [provider]  gabapentin (NEURONTIN) 600 MG tablet Take 1,800 mg by mouth at bedtime.  05/03/18  Yes [provider]  hydrochlorothiazide (HYDRODIURIL) 25 MG tablet Take 25 mg by mouth  daily. 04/03/18  Yes [provider]  hydrOXYzine (ATARAX/VISTARIL) 25 MG tablet Take 1 tablet (25 mg total) by mouth every 6 (six) hours as needed for anxiety (mild anxiety). Patient taking differently: Take 25 mg by mouth every 6 (six) hours as needed for anxiety (panic attacks).  07/24/17  Yes Shaune Pollack, MD  ibuprofen (ADVIL,MOTRIN) 200 MG tablet Take 600-800 mg by mouth every 6 (six) hours as needed for headache (pain).    Yes [provider]  Immune Globulin 10% (GAMMAGARD) 10 GM/100ML SOLN Inject into the vein every 30 (thirty) days.   Yes [provider]  ipratropium-albuterol (DUONEB) 0.5-2.5 (3) MG/3ML SOLN Inhale 3 mLs into the lungs every 4 (four) hours as needed for wheezing or shortness of breath. 04/15/17  Yes [provider]  montelukast (SINGULAIR) 10 MG tablet Take 10 mg by mouth at bedtime.  05/01/18  Yes [provider]  morphine (MS CONTIN) 30 MG 12 hr tablet Take 1 tablet (30 mg total) by mouth every 12 (twelve) hours. 04/28/17  Yes Narda Bonds, MD  NYSTATIN powder Apply 1 application topically 3 (three) times daily as needed (blistering sores under breast).  05/23/17  Yes [provider]  oxyCODONE-acetaminophen (PERCOCET) 10-325 MG tablet Take 1 tablet by mouth 4 (four) times daily. Hold for sedation    Yes [provider]  prazosin (MINIPRESS) 2 MG capsule Take 4 mg by mouth at bedtime. 05/03/18  Yes [provider]  promethazine (PHENERGAN) 25 MG tablet Take 25 mg by mouth every 8 (eight) hours as needed for nausea/vomiting.  04/15/17  Yes [provider]  tiZANidine (ZANAFLEX) 4 MG tablet Take 4 mg by mouth 3 (three) times daily. 04/02/18  Yes [provider]  benzonatate (TESSALON) 100 MG capsule Take 1 capsule (100 mg total) by mouth 3 (three) times daily as needed for cough. Patient not taking: Reported on 08/13/2017 04/28/17   Narda Bonds, MD  busPIRone (BUSPAR) 10 MG tablet Take 1  tablet (10 mg total) by mouth 2 (two) times daily. Patient not taking: Reported on 07/13/2018 01/09/18   Thresa Ross, MD  busPIRone (BUSPAR) 10 MG tablet TAKE 1 TABLET(10 MG) BY MOUTH TWICE DAILY Patient not taking: Reported on 05/17/2018 02/23/18   Thresa Ross, MD  citalopram (CELEXA) 20 MG tablet Take 1.5 tablets (30 mg total) by mouth daily. Patient not taking: Reported on 07/13/2018 01/09/18   Thresa Ross, MD  cyclobenzaprine (FLEXERIL) 10 MG tablet Take 1 tablet (10 mg total) by mouth 2 (two) times daily as needed for muscle spasms. Patient not taking: Reported on 07/13/2018 09/06/17   Alvira Monday, MD  dextromethorphan-guaiFENesin [facility] DM) 30-600 MG 12hr tablet Take 1 tablet by mouth 2 (two) times daily. Patient not taking: Reported on 01/09/2018 04/28/17   Narda Bonds, MD  diazepam (VALIUM) 2 MG tablet Take 1 tablet (2 mg total) by mouth every  4 (four) hours as needed for muscle spasms. Patient not taking: Reported on 05/17/2018 03/02/18   Lorre Nick, MD  gabapentin (NEURONTIN) 100 MG capsule Take 2 capsules (200 mg total) by mouth 3 (three) times daily. Patient not taking: Reported on 07/13/2018 07/31/17 02/10/18  Thresa Ross, MD  LORazepam (ATIVAN) 1 MG tablet Take 1 tablet (1 mg total) by mouth 3 (three) times daily as needed for anxiety (or nausea). Patient not taking: Reported on 07/13/2018 07/24/17   Shaune Pollack, MD  nicotine (NICODERM CQ - DOSED IN MG/24 HR) 7 mg/24hr patch Place 1 patch (7 mg total) onto the skin daily. Patient not taking: Reported on 08/24/2017 04/29/17   Narda Bonds, MD  OLANZapine zydis (ZYPREXA) 5 MG disintegrating tablet DISSOLVE 1 TABLET(5 MG) ON THE TONGUE AT BEDTIME Patient not taking: Reported on 07/13/2018 02/22/18   Thresa Ross, MD  OLANZapine zydis (ZYPREXA) 5 MG disintegrating tablet Take 1 tablet (5 mg total) by mouth at bedtime. Patient not taking: Reported on 07/13/2018 01/09/18   Thresa Ross, MD  polyethylene glycol [facility] / GLYCOLAX)  packet Take 17 g by mouth daily. Patient not taking: Reported on 01/09/2018 04/28/17   Narda Bonds, MD  amphetamine-dextroamphetamine (ADDERALL) 30 MG tablet Take 30 mg by mouth 2 (two) times daily.  10/02/15  [provider]  FLUoxetine (PROZAC) 40 MG capsule Take 1 capsule (40 mg total) by mouth daily. 10/02/15 01/10/17  Thresa Ross, MD  pregabalin (LYRICA) 225 MG capsule Take 225 mg by mouth 2 (two) times daily.  01/10/17  [provider]    Family History Family History  Problem Relation Age of Onset  . Alcohol abuse Mother   . Alcohol abuse Father   . Depression Sister   . Dementia Neg Hx     Social History Social History   Tobacco Use  . Smoking status: Former Smoker    Packs/day: 1.00    Types: Cigarettes    Last attempt to quit: 01/17/2017    Years since quitting: 1.4  . Smokeless tobacco: Never Used  Substance Use Topics  . Alcohol use: No    Comment: last use 2-3 years ago  . Drug use: No     Allergies   Ambien [zolpidem tartrate]; Beta adrenergic blockers; Fish allergy; Iodinated diagnostic agents; Latex; Metoprolol; Penicillins; Red dye; Shellfish allergy; Sulfa antibiotics; Amoxicillin-pot clavulanate; Aspirin; Dye fdc red  [amaranth (fd&c red #2)]; Tape; Talwin [pentazocine]; Levofloxacin; and Seroquel [quetiapine fumarate]   Review of Systems Review of Systems  Constitutional: Negative for chills and fever.  HENT: Negative for ear pain and sore throat.   Eyes: Negative for pain and visual disturbance.  Respiratory: Positive for shortness of breath and wheezing. Negative for cough and chest tightness.   Cardiovascular: Negative for chest pain and palpitations.  Gastrointestinal: Negative for abdominal pain and vomiting.  Genitourinary: Negative for dysuria and hematuria.  Musculoskeletal: Negative for arthralgias and back pain.  Skin: Positive for rash. Negative for color change.  Neurological: Negative for seizures and syncope.    Psychiatric/Behavioral: Positive for hallucinations and sleep disturbance. Negative for confusion, self-injury and suicidal ideas. The patient is not nervous/anxious and is not hyperactive.   All other systems reviewed and are negative.    Physical Exam Updated Vital Signs BP 133/83   Pulse 64   Temp 98 F (36.7 C) (Oral)   Resp 18   Ht 5\' 6"  (1.676 m)   Wt 108 kg (238 lb)   SpO2 92%  BMI 38.41 kg/m   Physical Exam  Constitutional: She is oriented to person, place, and time. She appears well-developed and well-nourished. No distress.  HENT:  Head: Normocephalic and atraumatic.  Mouth/Throat: Oropharynx is clear and moist. No oropharyngeal exudate.  Eyes: Pupils are equal, round, and reactive to Rockers.  Neck: Normal range of motion.  Cardiovascular: Regular rhythm and normal heart sounds.  Pulmonary/Chest: Effort normal. No accessory muscle usage. No tachypnea and no bradypnea. No respiratory distress. She has decreased breath sounds in the right upper field, the right middle field, the right lower field, the left upper field, the left middle field and the left lower field. She has wheezes. She has no rhonchi. She has no rales. She exhibits no tenderness.  Abdominal: Soft. Bowel sounds are normal. She exhibits no distension. There is no tenderness.  Musculoskeletal: She exhibits no tenderness or deformity.       Right lower leg: She exhibits no edema.       Left lower leg: She exhibits no edema.  Neurological: She is alert and oriented to person, place, and time.  Skin: Skin is warm and dry. Capillary refill takes less than 2 seconds. Rash noted. Rash is urticarial.     Raised urticarial rash present on BLUE.   Psychiatric: She has a normal mood and affect.  Nursing note and vitals reviewed.    ED Treatments / Results  Labs (all labs ordered are listed, but only abnormal results are displayed) Labs Reviewed  COMPREHENSIVE METABOLIC PANEL - Abnormal; Notable for the  following components:      Result Value   Potassium 3.4 (*)    Glucose, Bld 160 (*)    Creatinine, Ser 1.46 (*)    GFR calc non Af Amer 39 (*)    GFR calc Af Amer 46 (*)    All other components within normal limits  RAPID URINE DRUG SCREEN, HOSP PERFORMED - Abnormal; Notable for the following components:   Opiates POSITIVE (*)    All other components within normal limits  ETHANOL  CBC  ACETAMINOPHEN LEVEL  SALICYLATE LEVEL    EKG None  Radiology Dg Knee 2 Views Right  Result Date: 07/13/2018 CLINICAL DATA:  Right knee pain following a fall 1 week ago. EXAM: RIGHT KNEE - 1-2 VIEW COMPARISON:  02/04/2018. FINDINGS: Mild to moderate medial joint space narrowing mild progression. Mild medial spur formation with mild progression. There may be a fracture through the medial tibial spur. Otherwise, no fracture or dislocation is seen. A small effusion is noted. IMPRESSION: 1. Possible fracture through the base of a medial tibial spur. Otherwise, no fracture seen, limited by the lack of oblique views. 2. Small effusion. Electronically Signed   By: Beckie Salts M.D.   On: 07/13/2018 18:37    Procedures Procedures (including critical care time)  Medications Ordered in ED Medications  methylPREDNISolone sodium succinate (SOLU-MEDROL) 125 mg/2 mL injection 125 mg (125 mg Intravenous Given 07/13/18 1848)  ipratropium-albuterol (DUONEB) 0.5-2.5 (3) MG/3ML nebulizer solution 3 mL (3 mLs Nebulization Given 07/13/18 1722)  ipratropium-albuterol (DUONEB) 0.5-2.5 (3) MG/3ML nebulizer solution 3 mL (3 mLs Nebulization Given 07/13/18 1917)     Initial Impression / Assessment and Plan / ED Course  I have reviewed the triage vital signs and the nursing notes.  Pertinent labs & imaging results that were available during my care of the patient were reviewed by me and considered in my medical decision making (see chart for details).     Patient presents  to the ED with depression.  Sent here by her PCP for a  psychiatry eval.  She states she had a fall about a week ago and is complaining of some right knee pain.  Right knee x-ray showed a fracture through the base of medial tibial spur lung with a small effusion.  She also states she has this rash from coming in contact with a cat.  On examination there is wheezing throughout all lung fields, order a breathing treatment for patient along with some Solu-Medrol 125 mcg.  Upon reevaluation of patient patient still wheezing, I have ordered a second breathing treatment will reassess. TTS consult has been placed they stated they would be coming to see patient. Patient seen by TTS they state patient is not a treat to herself or anyone else.   Salicylate level acetaminophen level  Patient received two breathing treatments at this time, she states she will like to go home at this point. She is in her right state of mind and will follow up with psychiatrist when he returns from vacation.Return precautions have been provided.  Final Clinical Impressions(s) / ED Diagnoses   Final diagnoses:  Fall, initial encounter  Rash    ED Discharge Orders    None       Freddy JakschSoto, Nevin Kozuch, PA-C 07/13/18 2117    Loren RacerYelverton, David, MD 07/14/18 [phone]

## 2018-07-13 NOTE — Progress Notes (Addendum)
Patient is seen by me face-to-face and I have consulted with Dr. Jama Flavorsobos.  Patient continues to deny any SI/HI/AVH and contracts for safety patient states that she only came in because her outpatient office provider was on vacation and the staff at the office was unsure of what to do and instructed her to come to the hospital.  She states that she felt that she was having some bizarre behavior and hallucinations when taking her prazosin and she did not take it last night and she did not have any hallucinations.  She was instructed to stop taking the medication until she speaks with her outpatient provider and she is in agreement with this plan.  She reports that she is ready to be discharged to go home because she needs to take her oxycodone because she is in pain.  She does have a family member in the room with her.  Patient does not meet inpatient criteria and is psychiatrically cleared.  I contacted the EDP for the patient and notified her of our recommendations.   Marland Kitchen.Marland Kitchen.Marland Kitchen.Agree with NP Progress Note

## 2018-07-13 NOTE — ED Triage Notes (Signed)
Patient presents to ED from PCP reports was sent for Psych Eval. Patient reports she has been having depression, hearing voices and see things. Patient denies any SI/ HI. Patient reports she has not missed any of her medication doses.

## 2018-07-13 NOTE — ED Notes (Signed)
Pt's belongings given to family. 

## 2018-07-13 NOTE — ED Notes (Signed)
Dinner tray given

## 2018-07-13 NOTE — Progress Notes (Signed)
TTS consulted with Deanna Calkinsravis Money, NP who recommends d/c and to follow up with her current provider. ED staff advised of disposition.   Deanna Yang, MSW, LCSW Therapeutic Triage Specialist  [phone]
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
## 2018-07-19 ENCOUNTER — Encounter (HOSPITAL_COMMUNITY): Payer: Self-pay | Admitting: Emergency Medicine

## 2018-07-19 ENCOUNTER — Inpatient Hospital Stay (HOSPITAL_COMMUNITY)
Admission: EM | Admit: 2018-07-19 | Discharge: 2018-07-24 | DRG: 388 | Disposition: A | Discharge status: Home / Self Care | Payer: Medicare Other | Attending: Internal Medicine | Admitting: Internal Medicine

## 2018-07-19 ENCOUNTER — Emergency Department (HOSPITAL_COMMUNITY): Payer: Medicare Other

## 2018-07-19 DIAGNOSIS — N1831 Acute kidney failure, unspecified: Secondary | ICD-10-CM

## 2018-07-19 DIAGNOSIS — Z0189 Encounter for other specified special examinations: Secondary | ICD-10-CM

## 2018-07-19 DIAGNOSIS — N179 Acute kidney failure, unspecified: Secondary | ICD-10-CM

## 2018-07-19 DIAGNOSIS — F329 Major depressive disorder, single episode, unspecified: Secondary | ICD-10-CM | POA: Diagnosis present

## 2018-07-19 DIAGNOSIS — Z4659 Encounter for fitting and adjustment of other gastrointestinal appliance and device: Secondary | ICD-10-CM

## 2018-07-19 DIAGNOSIS — F419 Anxiety disorder, unspecified: Secondary | ICD-10-CM | POA: Diagnosis present

## 2018-07-19 DIAGNOSIS — R1110 Vomiting, unspecified: Secondary | ICD-10-CM

## 2018-07-19 DIAGNOSIS — G8929 Other chronic pain: Secondary | ICD-10-CM | POA: Diagnosis present

## 2018-07-19 DIAGNOSIS — J449 Chronic obstructive pulmonary disease, unspecified: Secondary | ICD-10-CM | POA: Diagnosis present

## 2018-07-19 DIAGNOSIS — Z6834 Body mass index (BMI) 34.0-34.9, adult: Secondary | ICD-10-CM

## 2018-07-19 DIAGNOSIS — K560 Paralytic ileus: Principal | ICD-10-CM | POA: Diagnosis present

## 2018-07-19 DIAGNOSIS — E876 Hypokalemia: Secondary | ICD-10-CM | POA: Diagnosis not present

## 2018-07-19 DIAGNOSIS — E860 Dehydration: Secondary | ICD-10-CM | POA: Diagnosis present

## 2018-07-19 DIAGNOSIS — Z881 Allergy status to other antibiotic agents status: Secondary | ICD-10-CM

## 2018-07-19 DIAGNOSIS — Z79899 Other long term (current) drug therapy: Secondary | ICD-10-CM

## 2018-07-19 DIAGNOSIS — K760 Fatty (change of) liver, not elsewhere classified: Secondary | ICD-10-CM | POA: Diagnosis present

## 2018-07-19 DIAGNOSIS — E669 Obesity, unspecified: Secondary | ICD-10-CM | POA: Diagnosis present

## 2018-07-19 DIAGNOSIS — Z87891 Personal history of nicotine dependence: Secondary | ICD-10-CM

## 2018-07-19 DIAGNOSIS — Z91041 Radiographic dye allergy status: Secondary | ICD-10-CM

## 2018-07-19 DIAGNOSIS — Z79891 Long term (current) use of opiate analgesic: Secondary | ICD-10-CM

## 2018-07-19 DIAGNOSIS — K56609 Unspecified intestinal obstruction, unspecified as to partial versus complete obstruction: Secondary | ICD-10-CM

## 2018-07-19 DIAGNOSIS — Z888 Allergy status to other drugs, medicaments and biological substances status: Secondary | ICD-10-CM

## 2018-07-19 DIAGNOSIS — G629 Polyneuropathy, unspecified: Secondary | ICD-10-CM | POA: Diagnosis present

## 2018-07-19 DIAGNOSIS — Z886 Allergy status to analgesic agent status: Secondary | ICD-10-CM

## 2018-07-19 DIAGNOSIS — R112 Nausea with vomiting, unspecified: Secondary | ICD-10-CM | POA: Diagnosis present

## 2018-07-19 DIAGNOSIS — Z880 Allergy status to penicillin: Secondary | ICD-10-CM

## 2018-07-19 DIAGNOSIS — G4733 Obstructive sleep apnea (adult) (pediatric): Secondary | ICD-10-CM | POA: Diagnosis present

## 2018-07-19 DIAGNOSIS — Z91040 Latex allergy status: Secondary | ICD-10-CM

## 2018-07-19 DIAGNOSIS — M797 Fibromyalgia: Secondary | ICD-10-CM | POA: Diagnosis present

## 2018-07-19 DIAGNOSIS — E872 Acidosis: Secondary | ICD-10-CM | POA: Diagnosis present

## 2018-07-19 DIAGNOSIS — Z7951 Long term (current) use of inhaled steroids: Secondary | ICD-10-CM

## 2018-07-19 DIAGNOSIS — I160 Hypertensive urgency: Secondary | ICD-10-CM | POA: Diagnosis present

## 2018-07-19 DIAGNOSIS — Z882 Allergy status to sulfonamides status: Secondary | ICD-10-CM

## 2018-07-19 DIAGNOSIS — Z6835 Body mass index (BMI) 35.0-35.9, adult: Secondary | ICD-10-CM

## 2018-07-19 DIAGNOSIS — N170 Acute kidney failure with tubular necrosis: Secondary | ICD-10-CM | POA: Diagnosis present

## 2018-07-19 DIAGNOSIS — Z9981 Dependence on supplemental oxygen: Secondary | ICD-10-CM

## 2018-07-19 DIAGNOSIS — R140 Abdominal distension (gaseous): Secondary | ICD-10-CM

## 2018-07-19 DIAGNOSIS — E440 Moderate protein-calorie malnutrition: Secondary | ICD-10-CM | POA: Diagnosis present

## 2018-07-19 DIAGNOSIS — N390 Urinary tract infection, site not specified: Secondary | ICD-10-CM | POA: Diagnosis present

## 2018-07-19 DIAGNOSIS — R1033 Periumbilical pain: Secondary | ICD-10-CM

## 2018-07-19 HISTORY — DX: Fatty (change of) liver, not elsewhere classified: K76.0

## 2018-07-19 LAB — COMPREHENSIVE METABOLIC PANEL
ALT: 47 U/L — ABNORMAL HIGH (ref 0–44)
ANION GAP: 13 (ref 5–15)
AST: 48 U/L — ABNORMAL HIGH (ref 15–41)
Albumin: 4.1 g/dL (ref 3.5–5.0)
Alkaline Phosphatase: 95 U/L (ref 38–126)
BILIRUBIN TOTAL: 0.7 mg/dL (ref 0.3–1.2)
BUN: 15 mg/dL (ref 6–20)
CO2: 28 mmol/L (ref 22–32)
Calcium: 9.2 mg/dL (ref 8.9–10.3)
Chloride: 100 mmol/L (ref 98–111)
Creatinine, Ser: 2.48 mg/dL — ABNORMAL HIGH (ref 0.44–1.00)
GFR, EST AFRICAN AMERICAN: 24 mL/min — AB (ref >60)
GFR, EST NON AFRICAN AMERICAN: 21 mL/min — AB (ref >60)
Glucose, Bld: 150 mg/dL — ABNORMAL HIGH (ref 70–99)
POTASSIUM: 3.7 mmol/L (ref 3.5–5.1)
Sodium: 141 mmol/L (ref 135–145)
TOTAL PROTEIN: 7.2 g/dL (ref 6.5–8.1)

## 2018-07-19 LAB — CBC
HEMATOCRIT: 48.5 % — AB (ref 36.0–46.0)
HEMOGLOBIN: 15.6 g/dL — AB (ref 12.0–15.0)
MCH: 27.9 pg (ref 26.0–34.0)
MCHC: 32.2 g/dL (ref 30.0–36.0)
MCV: 86.6 fL (ref 78.0–100.0)
Platelets: 393 10*3/uL (ref 150–400)
RBC: 5.6 MIL/uL — ABNORMAL HIGH (ref 3.87–5.11)
RDW: 13.4 % (ref 11.5–15.5)
WBC: 11.7 10*3/uL — AB (ref 4.0–10.5)

## 2018-07-19 LAB — URINALYSIS, ROUTINE W REFLEX MICROSCOPIC
Bilirubin Urine: NEGATIVE
GLUCOSE, UA: 50 mg/dL — AB
KETONES UR: NEGATIVE mg/dL
NITRITE: NEGATIVE
Protein, ur: 30 mg/dL — AB
SPECIFIC GRAVITY, URINE: 1.016 (ref 1.005–1.030)
pH: 5 (ref 5.0–8.0)

## 2018-07-19 LAB — LIPASE, BLOOD: Lipase: 37 U/L (ref 11–51)

## 2018-07-19 LAB — I-STAT BETA HCG BLOOD, ED (MC, WL, AP ONLY): I-stat hCG, quantitative: <5 m[IU]/mL (ref <5)

## 2018-07-19 MED ORDER — GI COCKTAIL ~~LOC~~: 30.0000 mL | Freq: Once | ORAL | Status: DC

## 2018-07-19 MED ORDER — ONDANSETRON HCL 4 MG/2ML IJ SOLN: 4.0000 mg | Freq: Once | INTRAMUSCULAR | Status: DC

## 2018-07-19 MED ORDER — LACTATED RINGERS IV BOLUS
1000.0000 mL | Freq: Once | INTRAVENOUS | Status: AC
  Administered 2018-07-19: 1000 mL via INTRAVENOUS

## 2018-07-19 MED ORDER — PROMETHAZINE HCL 25 MG/ML IJ SOLN
25.0000 mg | Freq: Once | INTRAMUSCULAR | Status: AC
  Administered 2018-07-19: 25 mg via INTRAVENOUS
  Filled 2018-07-19: qty 1

## 2018-07-19 NOTE — ED Provider Notes (Signed)
[facility] EMERGENCY DEPARTMENT Provider Note   CSN: [id number] Arrival date & time: 07/19/18  1752     History   Chief Complaint Chief Complaint  Patient presents with  . Abdominal Pain    HPI Deanna Yang is a 57 y.o. female with COPD, anxiety, depression, and fibromyalgia who presents to the ED at the request of her PCP due to an unusual finding on CT scan.  The patient is not sure what the abnormal finding is.  She says that she has been seeing her PCP due to unintentional weight loss over the past several months.  She says she has lost 40 pounds in the past 2 months and that she has had drenching night sweats for approximately 1 year.  She denies recent fever, chills, chest pain, and shortness of breath.  She says that she is on 3.5 L O2 at home.  She says that she has been vomiting 1-3 times daily now for weeks.  She also reports having several episodes of diarrhea daily.  She denies dysuria and says she does not drink any alcohol.  HPI  Past Medical History:  Diagnosis Date  . Anxiety   . COPD (chronic obstructive pulmonary disease) (HCC)   . Depression   . Fibromyalgia   . Hypertension   . Hypertension   . Manic depression (HCC)   . Neuropathy   . OSA (obstructive sleep apnea)     Patient Active Problem List   Diagnosis Date Noted  . ARF (acute renal failure) (HCC) 07/19/2018  . Vocal cord dysfunction 04/25/2017  . PTSD (post-traumatic stress disorder) 04/25/2017  . 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 Acute respiratory failure with hypoxia (HCC) 04/22/2017  . Hypokalemia 04/22/2017  . Leukocytosis 04/22/2017  . Prolonged QT interval 04/22/2017  . CFIDS (chronic fatigue and immune dysfunction syndrome) (HCC) 08/20/2015  . Foot pain 08/20/2015  . Essential (primary) hypertension 08/20/2015  . COPD with acute exacerbation (HCC) 08/20/2015  . Obstructive apnea 08/20/2015  . Cervical osteoarthritis 08/20/2015  . Sleep disorder  08/20/2015  . Other specified postprocedural states 07/17/2015  . Borderline personality disorder (HCC) 06/11/2015    Past Surgical History:  Procedure Laterality Date  . ABDOMINAL SURGERY    . APPENDECTOMY    . BACK SURGERY    . BREAST REDUCTION SURGERY    . BREAST SURGERY    . CARPAL TUNNEL RELEASE Bilateral   . CESAREAN SECTION    . CHOLECYSTECTOMY    . HERNIA REPAIR    . JOINT REPLACEMENT    . KNEE SURGERY    . TONSILLECTOMY       OB History   None      Home Medications    Prior to Admission medications   Medication Sig Start Date End Date Taking? Authorizing Provider  acyclovir ointment (ZOVIRAX) 5 % Apply 1 application topically every 3 (three) hours as needed (mouth sores).  01/27/18  Yes [provider]  albuterol (PROAIR HFA) 108 (90 Base) MCG/ACT inhaler Inhale 2 puffs into the lungs every 4 (four) hours as needed for wheezing. 06/30/17  Yes [provider]  amLODipine (NORVASC) 10 MG tablet Take 10 mg by mouth daily.  07/05/17  Yes [provider]  budesonide-formoterol (SYMBICORT) 160-4.5 MCG/ACT inhaler Inhale 2 puffs into the lungs 2 (two) times daily.   Yes [provider]  buPROPion (WELLBUTRIN XL) 300 MG 24 hr tablet Take 1 tablet (300 mg total) by mouth daily. 01/09/18  Yes Gilmore Laroche,  Nadeem, MD  busPIRone (BUSPAR) 15 MG tablet Take 15 mg by mouth 2 (two) times daily as needed (panic attacks).  07/04/18  Yes [provider]  cetirizine (ZYRTEC) 10 MG tablet Take 10 mg by mouth daily.   Yes [provider]  citalopram (CELEXA) 40 MG tablet Take 40 mg by mouth daily. 06/18/18  Yes [provider]  fluticasone (FLONASE) 50 MCG/ACT nasal spray Place 1 spray into both nostrils 2 (two) times daily. 05/31/18  Yes [provider]  gabapentin (NEURONTIN) 600 MG tablet Take 1,800 mg by mouth at bedtime.  05/03/18  Yes [provider]  hydrochlorothiazide (HYDRODIURIL) 25 MG tablet Take 25 mg by mouth  daily. 04/03/18  Yes [provider]  hydrOXYzine (ATARAX/VISTARIL) 25 MG tablet Take 1 tablet (25 mg total) by mouth every 6 (six) hours as needed for anxiety (mild anxiety). Patient taking differently: Take 25 mg by mouth every 6 (six) hours as needed for anxiety (panic attacks).  07/24/17  Yes Shaune Pollack, MD  ibuprofen (ADVIL,MOTRIN) 200 MG tablet Take 600-800 mg by mouth every 6 (six) hours as needed for headache (pain).    Yes [provider]  Immune Globulin 10% (GAMMAGARD) 10 GM/100ML SOLN Inject into the vein every 30 (thirty) days.   Yes [provider]  ipratropium-albuterol (DUONEB) 0.5-2.5 (3) MG/3ML SOLN Inhale 3 mLs into the lungs every 4 (four) hours as needed for wheezing or shortness of breath. 04/15/17  Yes [provider]  montelukast (SINGULAIR) 10 MG tablet Take 10 mg by mouth at bedtime.  05/01/18  Yes [provider]  morphine (MS CONTIN) 30 MG 12 hr tablet Take 1 tablet (30 mg total) by mouth every 12 (twelve) hours. 04/28/17  Yes Narda Bonds, MD  NYSTATIN powder Apply 1 application topically 3 (three) times daily as needed (blistering sores under breast).  05/23/17  Yes [provider]  oxyCODONE-acetaminophen (PERCOCET) 10-325 MG tablet Take 1 tablet by mouth 4 (four) times daily. Hold for sedation    Yes [provider]  prazosin (MINIPRESS) 2 MG capsule Take 2 mg by mouth at bedtime.  05/03/18  Yes [provider]  promethazine (PHENERGAN) 25 MG tablet Take 25 mg by mouth every 8 (eight) hours as needed for nausea/vomiting.  04/15/17  Yes [provider]  tiZANidine (ZANAFLEX) 4 MG tablet Take 4 mg by mouth 3 (three) times daily. 04/02/18  Yes [provider]  benzonatate (TESSALON) 100 MG capsule Take 1 capsule (100 mg total) by mouth 3 (three) times daily as needed for cough. Patient not taking: Reported on 08/13/2017 04/28/17   Narda Bonds, MD  busPIRone (BUSPAR) 10 MG tablet Take 1  tablet (10 mg total) by mouth 2 (two) times daily. Patient not taking: Reported on 07/13/2018 01/09/18   Thresa Ross, MD  busPIRone (BUSPAR) 10 MG tablet TAKE 1 TABLET(10 MG) BY MOUTH TWICE DAILY Patient not taking: Reported on 05/17/2018 02/23/18   Thresa Ross, MD  citalopram (CELEXA) 20 MG tablet Take 1.5 tablets (30 mg total) by mouth daily. Patient not taking: Reported on 07/13/2018 01/09/18   Thresa Ross, MD  cyclobenzaprine (FLEXERIL) 10 MG tablet Take 1 tablet (10 mg total) by mouth 2 (two) times daily as needed for muscle spasms. Patient not taking: Reported on 07/13/2018 09/06/17   Alvira Monday, MD  dextromethorphan-guaiFENesin [facility] DM) 30-600 MG 12hr tablet Take 1 tablet by mouth 2 (two) times daily. Patient not taking: Reported on 01/09/2018 04/28/17   Caleb Popp,  Howell Pringle, MD  diazepam (VALIUM) 2 MG tablet Take 1 tablet (2 mg total) by mouth every 4 (four) hours as needed for muscle spasms. Patient not taking: Reported on 05/17/2018 03/02/18   Lorre , MD  gabapentin (NEURONTIN) 100 MG capsule Take 2 capsules (200 mg total) by mouth 3 (three) times daily. Patient not taking: Reported on 07/13/2018 07/31/17 02/10/18  Thresa Ross, MD  LORazepam (ATIVAN) 1 MG tablet Take 1 tablet (1 mg total) by mouth 3 (three) times daily as needed for anxiety (or nausea). Patient not taking: Reported on 07/13/2018 07/24/17   Shaune Pollack, MD  nicotine (NICODERM CQ - DOSED IN MG/24 HR) 7 mg/24hr patch Place 1 patch (7 mg total) onto the skin daily. Patient not taking: Reported on 07/19/2018 04/29/17   Narda Bonds, MD  OLANZapine zydis (ZYPREXA) 5 MG disintegrating tablet DISSOLVE 1 TABLET(5 MG) ON THE TONGUE AT BEDTIME Patient not taking: Reported on 07/13/2018 02/22/18   Thresa Ross, MD  OLANZapine zydis (ZYPREXA) 5 MG disintegrating tablet Take 1 tablet (5 mg total) by mouth at bedtime. Patient not taking: Reported on 07/13/2018 01/09/18   Thresa Ross, MD  polyethylene glycol [facility] / GLYCOLAX)  packet Take 17 g by mouth daily. Patient not taking: Reported on 01/09/2018 04/28/17   Narda Bonds, MD  amphetamine-dextroamphetamine (ADDERALL) 30 MG tablet Take 30 mg by mouth 2 (two) times daily.  10/02/15  [provider]  FLUoxetine (PROZAC) 40 MG capsule Take 1 capsule (40 mg total) by mouth daily. 10/02/15 01/10/17  Thresa Ross, MD  pregabalin (LYRICA) 225 MG capsule Take 225 mg by mouth 2 (two) times daily.  01/10/17  [provider]    Family History Family History  Problem Relation Age of Onset  . Alcohol abuse Mother   . Alcohol abuse Father   . Depression Sister   . Dementia Neg Hx     Social History Social History   Tobacco Use  . Smoking status: Former Smoker    Packs/day: 1.00    Types: Cigarettes    Last attempt to quit: 01/17/2017    Years since quitting: 1.5  . Smokeless tobacco: Never Used  Substance Use Topics  . Alcohol use: No    Comment: last use 2-3 years ago  . Drug use: No     Allergies   Ambien [zolpidem tartrate]; Beta adrenergic blockers; Fish allergy; Iodinated diagnostic agents; Latex; Metoprolol; Penicillins; Red dye; Shellfish allergy; Sulfa antibiotics; Amoxicillin-pot clavulanate; Aspirin; Dye fdc red  [amaranth (fd&c red #2)]; Tape; Talwin [pentazocine]; Levofloxacin; and Seroquel [quetiapine fumarate]   Review of Systems Review of Systems Review of Systems   Constitutional  Negative for fever  Negative for chills  HENT  Negative for ear pain  Negative for sore throat  Negative for difficultly swallowing  Eyes  Negative for eye pain  Negative for visual disturbance  Respiratory  Negative for shortness of breath  Negative for cough  CV  Negative for chest pain  Negative for leg swelling  Abdomen  +for abdominal pain  +for nausea  +for vomiting  MSK  Negative for extremity pain  Negative for back pain  Skin  Negative for rash  Negative for wound  Neuro  Negative for syncope  Negative  for difficultly speaking  Psych  Negative for confusion   The remainder of the ROS was reviewed and negative except as documented above.      Physical Exam Updated Vital Signs BP (!) 167/112   Pulse Marland Kitchen)  113   Temp 98 F (36.7 C) (Oral)   Resp 17   SpO2 97%   Physical Exam Physical Exam Constitutional  Nursing notes reviewed  Vital signs reviewed  Obese  HEENT  No obvious trauma  Supple without meningismus, mass, or overt JVD  EOMI  No scleral icterus or injection  Respiratory  Effort normal  CTAB  No respiratory distress  CV  Normal rate  No obvious murmurs  No pitting edema  Abdomen  Soft  Periumbilical tenderness and voluntary guarding  Non-distended  No peritonitis  MSK  Atraumatic  No obvious deformity  ROM appropriate  Skin  Warm  Dry  Neuro  Awake and alert  EOMI  Moving all extremities  Psychiatric  Mood and affect normal        ED Treatments / Results  Labs (all labs ordered are listed, but only abnormal results are displayed) Labs Reviewed  COMPREHENSIVE METABOLIC PANEL - Abnormal; Notable for the following components:      Result Value   Glucose, Bld 150 (*)    Creatinine, Ser 2.48 (*)    AST 48 (*)    ALT 47 (*)    GFR calc non Af Amer 21 (*)    GFR calc Af Amer 24 (*)    All other components within normal limits  CBC - Abnormal; Notable for the following components:   WBC 11.7 (*)    RBC 5.60 (*)    Hemoglobin 15.6 (*)    HCT 48.5 (*)    All other components within normal limits  URINALYSIS, ROUTINE W REFLEX MICROSCOPIC - Abnormal; Notable for the following components:   APPearance HAZY (*)    Glucose, UA 50 (*)    Hgb urine dipstick SMALL (*)    Protein, ur 30 (*)    Leukocytes, UA TRACE (*)    Bacteria, UA FEW (*)    All other components within normal limits  GASTROINTESTINAL PANEL BY PCR, STOOL (REPLACES STOOL CULTURE)  C DIFFICILE QUICK SCREEN W PCR REFLEX  LIPASE, BLOOD  LACTIC ACID, PLASMA   I-STAT BETA HCG BLOOD, ED ([facility], [facility], [facility] ONLY)    EKG EKG Interpretation  Date/Time:  Thursday July 19 2018 20:45:02 EDT Ventricular Rate:  107 PR Interval:    QRS Duration: 91 QT Interval:  340 QTC Calculation: 454 R Axis:   63 Text Interpretation:  Sinus tachycardia Borderline repolarization abnormality No significant change since last tracing Confirmed by Richardean Canal [phone]) on 07/19/2018 8:50:59 PM   Radiology Dg Abd Acute W/chest  Result Date: 07/19/2018 CLINICAL DATA:  Abdominal pain with nausea and vomiting EXAM: DG ABDOMEN ACUTE W/ 1V CHEST COMPARISON:  Chest radiograph May 17, 2018; CT abdomen and pelvis December 16, 2017 FINDINGS: PA chest: There is atelectatic change in the left base. The lungs elsewhere clear. Heart size and pulmonary vascularity are normal. No adenopathy. There is postoperative change in the lower cervical region. Supine and upright abdomen: There are loops of mildly dilated small bowel without appreciable air-fluid level. No evident free air. There are surgical clips in the right upper abdomen. There are phleboliths in the pelvis as well as a small calcified uterine leiomyoma. IMPRESSION: Loops of mildly dilated small bowel without air-fluid levels. Question enteritis or early ileus. Bowel obstruction felt to be less likely. No free air. There is left base atelectasis. Lungs elsewhere clear. There is a small calcified uterine leiomyoma. Electronically Signed   By: Bretta Bang III M.D.   On:  07/19/2018 21:46    Procedures Procedures (including critical care time)  Medications Ordered in ED Medications  LORazepam (ATIVAN) injection 1 mg (has no administration in time range)  lactated ringers bolus 1,000 mL (1,000 mLs Intravenous New Bag/Given 07/19/18 2105)  promethazine (PHENERGAN) injection 25 mg (25 mg Intravenous Given 07/19/18 2335)     Initial Impression / Assessment and Plan / ED Course  I have reviewed the triage vital signs and the nursing  notes.  Pertinent labs & imaging results that were available during my care of the patient were reviewed by me and considered in my medical decision making (see chart for details).  Clinical Course as of Jul 20 56  Thu Jul 19, 2018  2007 Kristeen MansDolores Picklesimer presents due to an abnormal CT finding and she and at the request of her PCP she says.On exam, she is hemodynamically stable and well-appearing overall.  She does have periumbilical tenderness.  She reports significant weight loss and vomiting and diarrhea over the past several weeks.She has had extensive abdominal surgeries, including bowel resection, appendectomy, cholecystectomy.    The differential her presentation is large and  ncludes malignancy, gastritis, gastroenteritis, pancreatitis, DKA, and UTI.  I reviewed her labs and imaging from the outside hospital.  She had an AKI at the outside facility.  Her creatinine is 1.8.  The OSH CT abdomen/pelvis WO revealed "Probable partial mid small bowel obstruction, incompletely imaged. No significant abnormalities elsewhere."  Her clinical presentation is not consistent with a bowel obstruction.  I have a very low pretest probability for a bowel obstruction.  An acute abdominal series was ordered.   [NA]  2150 Acute abdominal series is concerning for possible ileus.  Radiology feels that SBO is less likely.  The cause of this is unclear.  She will be admitted for her AKI and for further investigation of her weight loss.  She is receiving IV fluids in the ED.  C. difficile and GIP ordered. WBC 11.7, no significant abnormalities on CMP, lipase normal.  [NA]    Clinical Course User Index [NA] Talitha GivensAshburn, Miata Culbreth, MD     Final Clinical Impressions(s) / ED Diagnoses   Final diagnoses:  AKI (acute kidney injury) [facility](HCC)  Periumbilical abdominal pain    ED Discharge Orders    None       Talitha GivensAshburn, Merelyn Klump, MD 07/20/18 0100    Charlynne PanderYao, David Hsienta, MD 07/20/18 2027

## 2018-07-19 NOTE — ED Triage Notes (Signed)
Pt states she had her gallbladder and appendix removed 2 years ago. Over the past year she has dealt with ongoing nausea/vomiting, losing weight and abd pain. Pt's MD ordered a CT scan for today- after getting the results, the MD told the pt to come to ED to be admitted. Pt does not know the results of the scan.

## 2018-07-19 NOTE — ED Notes (Signed)
Pt taken xray

## 2018-07-19 NOTE — ED Notes (Signed)
Two unsuccessful attempts to start saline lock line. Next nurse asked to attempt.

## 2018-07-20 ENCOUNTER — Encounter (HOSPITAL_COMMUNITY): Payer: Self-pay | Admitting: Internal Medicine

## 2018-07-20 ENCOUNTER — Observation Stay (HOSPITAL_COMMUNITY): Payer: Medicare Other

## 2018-07-20 ENCOUNTER — Other Ambulatory Visit: Payer: Self-pay

## 2018-07-20 DIAGNOSIS — R634 Abnormal weight loss: Secondary | ICD-10-CM | POA: Diagnosis not present

## 2018-07-20 DIAGNOSIS — Z9981 Dependence on supplemental oxygen: Secondary | ICD-10-CM | POA: Diagnosis not present

## 2018-07-20 DIAGNOSIS — K560 Paralytic ileus: Secondary | ICD-10-CM | POA: Diagnosis present

## 2018-07-20 DIAGNOSIS — Z91041 Radiographic dye allergy status: Secondary | ICD-10-CM | POA: Diagnosis not present

## 2018-07-20 DIAGNOSIS — E440 Moderate protein-calorie malnutrition: Secondary | ICD-10-CM | POA: Diagnosis present

## 2018-07-20 DIAGNOSIS — Z886 Allergy status to analgesic agent status: Secondary | ICD-10-CM | POA: Diagnosis not present

## 2018-07-20 DIAGNOSIS — N179 Acute kidney failure, unspecified: Secondary | ICD-10-CM | POA: Diagnosis not present

## 2018-07-20 DIAGNOSIS — I160 Hypertensive urgency: Secondary | ICD-10-CM | POA: Diagnosis present

## 2018-07-20 DIAGNOSIS — E860 Dehydration: Secondary | ICD-10-CM | POA: Diagnosis present

## 2018-07-20 DIAGNOSIS — Z91040 Latex allergy status: Secondary | ICD-10-CM | POA: Diagnosis not present

## 2018-07-20 DIAGNOSIS — R197 Diarrhea, unspecified: Secondary | ICD-10-CM | POA: Diagnosis not present

## 2018-07-20 DIAGNOSIS — J449 Chronic obstructive pulmonary disease, unspecified: Secondary | ICD-10-CM | POA: Diagnosis present

## 2018-07-20 DIAGNOSIS — Z79891 Long term (current) use of opiate analgesic: Secondary | ICD-10-CM | POA: Diagnosis not present

## 2018-07-20 DIAGNOSIS — N390 Urinary tract infection, site not specified: Secondary | ICD-10-CM | POA: Diagnosis present

## 2018-07-20 DIAGNOSIS — K760 Fatty (change of) liver, not elsewhere classified: Secondary | ICD-10-CM | POA: Diagnosis present

## 2018-07-20 DIAGNOSIS — R933 Abnormal findings on diagnostic imaging of other parts of digestive tract: Secondary | ICD-10-CM | POA: Diagnosis not present

## 2018-07-20 DIAGNOSIS — Z882 Allergy status to sulfonamides status: Secondary | ICD-10-CM | POA: Diagnosis not present

## 2018-07-20 DIAGNOSIS — N170 Acute kidney failure with tubular necrosis: Secondary | ICD-10-CM | POA: Diagnosis present

## 2018-07-20 DIAGNOSIS — F419 Anxiety disorder, unspecified: Secondary | ICD-10-CM | POA: Diagnosis present

## 2018-07-20 DIAGNOSIS — E872 Acidosis: Secondary | ICD-10-CM | POA: Diagnosis present

## 2018-07-20 DIAGNOSIS — Z6835 Body mass index (BMI) 35.0-35.9, adult: Secondary | ICD-10-CM | POA: Diagnosis not present

## 2018-07-20 DIAGNOSIS — F329 Major depressive disorder, single episode, unspecified: Secondary | ICD-10-CM | POA: Diagnosis present

## 2018-07-20 DIAGNOSIS — R1033 Periumbilical pain: Secondary | ICD-10-CM | POA: Diagnosis not present

## 2018-07-20 DIAGNOSIS — Z880 Allergy status to penicillin: Secondary | ICD-10-CM | POA: Diagnosis not present

## 2018-07-20 DIAGNOSIS — Z881 Allergy status to other antibiotic agents status: Secondary | ICD-10-CM | POA: Diagnosis not present

## 2018-07-20 DIAGNOSIS — R112 Nausea with vomiting, unspecified: Secondary | ICD-10-CM | POA: Diagnosis present

## 2018-07-20 DIAGNOSIS — M797 Fibromyalgia: Secondary | ICD-10-CM | POA: Diagnosis present

## 2018-07-20 DIAGNOSIS — E876 Hypokalemia: Secondary | ICD-10-CM | POA: Diagnosis not present

## 2018-07-20 DIAGNOSIS — Z888 Allergy status to other drugs, medicaments and biological substances status: Secondary | ICD-10-CM | POA: Diagnosis not present

## 2018-07-20 DIAGNOSIS — G8929 Other chronic pain: Secondary | ICD-10-CM | POA: Diagnosis present

## 2018-07-20 LAB — CBC
HEMATOCRIT: 44.3 % (ref 36.0–46.0)
HEMOGLOBIN: 14.4 g/dL (ref 12.0–15.0)
MCH: 28 pg (ref 26.0–34.0)
MCHC: 32.5 g/dL (ref 30.0–36.0)
MCV: 86.2 fL (ref 78.0–100.0)
Platelets: 391 10*3/uL (ref 150–400)
RBC: 5.14 MIL/uL — ABNORMAL HIGH (ref 3.87–5.11)
RDW: 13.3 % (ref 11.5–15.5)
WBC: 13.9 10*3/uL — ABNORMAL HIGH (ref 4.0–10.5)

## 2018-07-20 LAB — BASIC METABOLIC PANEL
ANION GAP: 16 — AB (ref 5–15)
BUN: 16 mg/dL (ref 6–20)
CALCIUM: 9.1 mg/dL (ref 8.9–10.3)
CO2: 26 mmol/L (ref 22–32)
Chloride: 101 mmol/L (ref 98–111)
Creatinine, Ser: 2.32 mg/dL — ABNORMAL HIGH (ref 0.44–1.00)
GFR calc Af Amer: 26 mL/min — ABNORMAL LOW (ref >60)
GFR calc non Af Amer: 23 mL/min — ABNORMAL LOW (ref >60)
GLUCOSE: 128 mg/dL — AB (ref 70–99)
Potassium: 3.1 mmol/L — ABNORMAL LOW (ref 3.5–5.1)
Sodium: 143 mmol/L (ref 135–145)

## 2018-07-20 LAB — HEPATIC FUNCTION PANEL
ALK PHOS: 91 U/L (ref 38–126)
ALT: 49 U/L — ABNORMAL HIGH (ref 0–44)
AST: 55 U/L — ABNORMAL HIGH (ref 15–41)
Albumin: 3.8 g/dL (ref 3.5–5.0)
BILIRUBIN DIRECT: 0.2 mg/dL (ref 0.0–0.2)
BILIRUBIN INDIRECT: 0.9 mg/dL (ref 0.3–0.9)
BILIRUBIN TOTAL: 1.1 mg/dL (ref 0.3–1.2)
TOTAL PROTEIN: 6.7 g/dL (ref 6.5–8.1)

## 2018-07-20 LAB — HIV ANTIBODY (ROUTINE TESTING W REFLEX): HIV Screen 4th Generation wRfx: NONREACTIVE

## 2018-07-20 LAB — LACTIC ACID, PLASMA
Lactic Acid, Venous: 4.2 mmol/L (ref 0.5–1.9)
Lactic Acid, Venous: 1.7 mmol/L (ref 0.5–1.9)

## 2018-07-20 MED ORDER — CIPROFLOXACIN IN D5W 400 MG/200ML IV SOLN
400.0000 mg | INTRAVENOUS | Status: DC
  Administered 2018-07-20 – 2018-07-21 (×2): 400 mg via INTRAVENOUS
  Filled 2018-07-20 (×2): qty 200

## 2018-07-20 MED ORDER — OXYCODONE-ACETAMINOPHEN 5-325 MG PO TABS
1.0000 | ORAL_TABLET | Freq: Four times a day (QID) | ORAL | Status: DC
  Administered 2018-07-20 – 2018-07-24 (×12): 1 via ORAL
  Filled 2018-07-20 (×13): qty 1

## 2018-07-20 MED ORDER — MONTELUKAST SODIUM 10 MG PO TABS
10.0000 mg | ORAL_TABLET | Freq: Every day | ORAL | Status: DC
  Administered 2018-07-20 – 2018-07-23 (×2): 10 mg via ORAL
  Filled 2018-07-20 (×3): qty 1

## 2018-07-20 MED ORDER — ACETAMINOPHEN 650 MG RE SUPP: 650.0000 mg | Freq: Four times a day (QID) | RECTAL | Status: DC | PRN

## 2018-07-20 MED ORDER — SODIUM CHLORIDE 0.9 % IV SOLN
INTRAVENOUS | Status: AC
  Administered 2018-07-20 (×2): via INTRAVENOUS

## 2018-07-20 MED ORDER — HYDROMORPHONE HCL 1 MG/ML IJ SOLN
1.0000 mg | INTRAMUSCULAR | Status: DC | PRN
  Administered 2018-07-20 – 2018-07-22 (×3): 1 mg via INTRAVENOUS
  Filled 2018-07-20 (×3): qty 1

## 2018-07-20 MED ORDER — SODIUM CHLORIDE 0.9 % IV BOLUS
1000.0000 mL | Freq: Once | INTRAVENOUS | Status: AC
  Administered 2018-07-20: 1000 mL via INTRAVENOUS

## 2018-07-20 MED ORDER — PROMETHAZINE HCL 25 MG/ML IJ SOLN
12.5000 mg | Freq: Four times a day (QID) | INTRAMUSCULAR | Status: DC | PRN
  Administered 2018-07-20 – 2018-07-24 (×11): 12.5 mg via INTRAVENOUS
  Filled 2018-07-20 (×12): qty 1

## 2018-07-20 MED ORDER — ACETAMINOPHEN 325 MG PO TABS: 650.0000 mg | ORAL_TABLET | Freq: Four times a day (QID) | ORAL | Status: DC | PRN

## 2018-07-20 MED ORDER — LORAZEPAM 2 MG/ML IJ SOLN: 1.0000 mg | Freq: Once | INTRAMUSCULAR | Status: DC

## 2018-07-20 MED ORDER — IPRATROPIUM-ALBUTEROL 0.5-2.5 (3) MG/3ML IN SOLN
3.0000 mL | Freq: Two times a day (BID) | RESPIRATORY_TRACT | Status: DC
  Administered 2018-07-20 – 2018-07-24 (×6): 3 mL via RESPIRATORY_TRACT
  Filled 2018-07-20 (×8): qty 3

## 2018-07-20 MED ORDER — AMLODIPINE BESYLATE 10 MG PO TABS
10.0000 mg | ORAL_TABLET | Freq: Every day | ORAL | Status: DC
  Administered 2018-07-20 – 2018-07-24 (×4): 10 mg via ORAL
  Filled 2018-07-20 (×5): qty 1

## 2018-07-20 MED ORDER — HYDRALAZINE HCL 20 MG/ML IJ SOLN
10.0000 mg | INTRAMUSCULAR | Status: DC | PRN
  Administered 2018-07-20: 10 mg via INTRAVENOUS
  Filled 2018-07-20: qty 1

## 2018-07-20 MED ORDER — LORAZEPAM 2 MG/ML IJ SOLN
1.0000 mg | Freq: Once | INTRAMUSCULAR | Status: AC
  Administered 2018-07-20: 1 mg via INTRAVENOUS

## 2018-07-20 MED ORDER — IPRATROPIUM-ALBUTEROL 0.5-2.5 (3) MG/3ML IN SOLN
3.0000 mL | RESPIRATORY_TRACT | Status: DC
  Administered 2018-07-20 (×2): 3 mL via RESPIRATORY_TRACT
  Filled 2018-07-20 (×2): qty 3

## 2018-07-20 MED ORDER — POTASSIUM CHLORIDE CRYS ER 20 MEQ PO TBCR
40.0000 meq | EXTENDED_RELEASE_TABLET | ORAL | Status: AC
  Administered 2018-07-20 (×2): 40 meq via ORAL
  Filled 2018-07-20 (×2): qty 2

## 2018-07-20 MED ORDER — MORPHINE SULFATE ER 15 MG PO TBCR
30.0000 mg | EXTENDED_RELEASE_TABLET | Freq: Two times a day (BID) | ORAL | Status: DC
  Administered 2018-07-20 – 2018-07-24 (×7): 30 mg via ORAL
  Filled 2018-07-20 (×8): qty 2

## 2018-07-20 MED ORDER — BOOST / RESOURCE BREEZE PO LIQD CUSTOM
1.0000 | Freq: Three times a day (TID) | ORAL | Status: DC
Start: 2018-07-20 — End: 2018-07-23
  Administered 2018-07-20: 1 via ORAL

## 2018-07-20 MED ORDER — BUSPIRONE HCL 15 MG PO TABS
15.0000 mg | ORAL_TABLET | Freq: Two times a day (BID) | ORAL | Status: DC | PRN
  Filled 2018-07-20: qty 1

## 2018-07-20 MED ORDER — HYDRALAZINE HCL 20 MG/ML IJ SOLN: 5.0000 mg | INTRAMUSCULAR | Status: DC | PRN

## 2018-07-20 MED ORDER — PRAZOSIN HCL 2 MG PO CAPS
2.0000 mg | ORAL_CAPSULE | Freq: Every day | ORAL | Status: DC
  Administered 2018-07-20 – 2018-07-23 (×2): 2 mg via ORAL
  Filled 2018-07-20 (×4): qty 1

## 2018-07-20 MED ORDER — MORPHINE SULFATE ER 15 MG PO TBCR: 30.0000 mg | EXTENDED_RELEASE_TABLET | Freq: Two times a day (BID) | ORAL | Status: DC

## 2018-07-20 MED ORDER — OXYCODONE-ACETAMINOPHEN 10-325 MG PO TABS
1.0000 | ORAL_TABLET | Freq: Four times a day (QID) | ORAL | Status: DC
Start: 2018-07-20 — End: 2018-07-20

## 2018-07-20 MED ORDER — LORAZEPAM 2 MG/ML IJ SOLN
INTRAMUSCULAR | Status: AC
  Filled 2018-07-20: qty 1

## 2018-07-20 MED ORDER — ALBUTEROL SULFATE (2.5 MG/3ML) 0.083% IN NEBU
2.5000 mg | INHALATION_SOLUTION | RESPIRATORY_TRACT | Status: DC | PRN
Start: 2018-07-20 — End: 2018-07-24

## 2018-07-20 MED ORDER — OXYCODONE HCL 5 MG PO TABS
5.0000 mg | ORAL_TABLET | Freq: Four times a day (QID) | ORAL | Status: DC
  Administered 2018-07-20 – 2018-07-24 (×12): 5 mg via ORAL
  Filled 2018-07-20 (×13): qty 1

## 2018-07-20 NOTE — H&P (Addendum)
History and Physical    Deanna Yang ZOX:[id number] DOB: 10-30-1961 DOA: 07/19/2018  PCP: Eartha Inch, MD  Patient coming from: Home.  Chief Complaint: Nausea vomiting diarrhea.  HPI: Deanna Yang is a 57 y.o. female with history of hypertension, depression, sleep apnea, chronic pain has been experiencing nausea vomiting and diarrhea for last 3 months and has lost 60 pounds.  Patient states he has had a colonoscopy few years ago which was unremarkable.  Records of which are not available.  Denies any blood in the vomitus or diarrhea.  Denies any fever chills.  Her primary care physician had ordered a CT scan of the abdomen results of which are available in care everywhere.  Was done yesterday and was showing concerning features of partial small bowel obstruction and was instructed to come to the ER.  Patient states he takes IV immunoglobulin every month after she had appendix surgery which perforated and since then she is on this medicine last 5 years.  ED Course: The ER acute abdomen series was done which shows possibility of paralytic ileus.  On exam patient has distended abdomen and had large amount of vomitus prior to my exam.  Patient also has been having daily diarrhea.  Patient blood pressure is found to be markedly elevated.  Creatinine has increased from last week.  Lactate is markedly elevated.  Was given fluid bolus.  Patient admitted for further management.  Review of Systems: As per HPI, rest all negative.   Past Medical History:  Diagnosis Date  . Anxiety   . COPD (chronic obstructive pulmonary disease) (HCC)   . Depression   . Fibromyalgia   . Hypertension   . Hypertension   . Manic depression (HCC)   . Neuropathy   . OSA (obstructive sleep apnea)     Past Surgical History:  Procedure Laterality Date  . ABDOMINAL SURGERY    . APPENDECTOMY    . BACK SURGERY    . BREAST REDUCTION SURGERY    . BREAST SURGERY    . CARPAL TUNNEL RELEASE Bilateral   . CESAREAN  SECTION    . CHOLECYSTECTOMY    . HERNIA REPAIR    . JOINT REPLACEMENT    . KNEE SURGERY    . TONSILLECTOMY       reports that she quit smoking about 18 months ago. Her smoking use included cigarettes. She smoked 1.00 pack per day. She has never used smokeless tobacco. She reports that she does not drink alcohol or use drugs.  Allergies  Allergen Reactions  . Ambien [Zolpidem Tartrate] Shortness Of Breath and Swelling    Tongue swelling, throat swelling.   . Beta Adrenergic Blockers Anaphylaxis  . Fish Allergy Shortness Of Breath and Rash  . Iodinated Diagnostic Agents Itching    Benadryl 50MG  prophylaxis before and after and patient reports she did fine  . Latex Itching and Other (See Comments)    Blisters skin  . Metoprolol Other (See Comments)    ANY MEDICATION THAT ENDS IN -OLOL-   The patient is asthmatic.  Marland Kitchen Penicillins Shortness Of Breath, Rash and Other (See Comments)    Has patient had a PCN reaction causing immediate rash, facial/tongue/throat swelling, SOB or lightheadedness with hypotension: Yes Has patient had a PCN reaction causing severe rash involving mucus membranes or skin necrosis: Unknown Has patient had a PCN reaction that required hospitalization No Has patient had a PCN reaction occurring within the last 10 years: No If all of the above answers  are "NO", then may proceed with Cephalosporin use.   . Red Dye Hives and Shortness Of Breath  . Shellfish Allergy Anaphylaxis  . Sulfa Antibiotics Hives and Shortness Of Breath  . Amoxicillin-Pot Clavulanate Nausea And Vomiting  . Aspirin Other (See Comments)    Blisters on tongue from higher doses - tolerates 81 mg   . Dye Fdc Red  [Amaranth (Fd&C Red #2)] Hives    X ray DYE  (BENADRYL 50 MG PROPHYLAXIS AND AFTER AND  SHE DID FINE, PER PATIENT.).  Marland Kitchen Tape Dermatitis and Rash    Adhesive   . Talwin [Pentazocine] Other (See Comments)    Unknown reaction  . Levofloxacin Rash and Other (See Comments)    headache    . Seroquel [Quetiapine Fumarate] Other (See Comments)    hallucinate    Family History  Problem Relation Age of Onset  . Alcohol abuse Mother   . Alcohol abuse Father   . Depression Sister   . Dementia Neg Hx     Prior to Admission medications   Medication Sig Start Date End Date Taking? Authorizing Provider  acyclovir ointment (ZOVIRAX) 5 % Apply 1 application topically every 3 (three) hours as needed (mouth sores).  01/27/18  Yes [provider]  albuterol (PROAIR HFA) 108 (90 Base) MCG/ACT inhaler Inhale 2 puffs into the lungs every 4 (four) hours as needed for wheezing. 06/30/17  Yes [provider]  amLODipine (NORVASC) 10 MG tablet Take 10 mg by mouth daily.  07/05/17  Yes [provider]  budesonide-formoterol (SYMBICORT) 160-4.5 MCG/ACT inhaler Inhale 2 puffs into the lungs 2 (two) times daily.   Yes [provider]  buPROPion (WELLBUTRIN XL) 300 MG 24 hr tablet Take 1 tablet (300 mg total) by mouth daily. 01/09/18  Yes Thresa Ross, MD  busPIRone (BUSPAR) 15 MG tablet Take 15 mg by mouth 2 (two) times daily as needed (panic attacks).  07/04/18  Yes [provider]  cetirizine (ZYRTEC) 10 MG tablet Take 10 mg by mouth daily.   Yes [provider]  citalopram (CELEXA) 40 MG tablet Take 40 mg by mouth daily. 06/18/18  Yes [provider]  fluticasone (FLONASE) 50 MCG/ACT nasal spray Place 1 spray into both nostrils 2 (two) times daily. 05/31/18  Yes [provider]  gabapentin (NEURONTIN) 600 MG tablet Take 1,800 mg by mouth at bedtime.  05/03/18  Yes [provider]  hydrochlorothiazide (HYDRODIURIL) 25 MG tablet Take 25 mg by mouth daily. 04/03/18  Yes [provider]  hydrOXYzine (ATARAX/VISTARIL) 25 MG tablet Take 1 tablet (25 mg total) by mouth every 6 (six) hours as needed for anxiety (mild anxiety). Patient taking differently: Take 25 mg by mouth every 6 (six) hours as needed for anxiety (panic  attacks).  07/24/17  Yes Shaune Pollack, MD  ibuprofen (ADVIL,MOTRIN) 200 MG tablet Take 600-800 mg by mouth every 6 (six) hours as needed for headache (pain).    Yes [provider]  Immune Globulin 10% (GAMMAGARD) 10 GM/100ML SOLN Inject into the vein every 30 (thirty) days.   Yes [provider]  ipratropium-albuterol (DUONEB) 0.5-2.5 (3) MG/3ML SOLN Inhale 3 mLs into the lungs every 4 (four) hours as needed for wheezing or shortness of breath. 04/15/17  Yes [provider]  montelukast (SINGULAIR) 10 MG tablet Take 10 mg by mouth at bedtime.  05/01/18  Yes [provider]  morphine (MS CONTIN) 30 MG 12 hr tablet Take 1 tablet (30 mg total) by mouth  every 12 (twelve) hours. 04/28/17  Yes Narda BondsNettey, Ralph A, MD  NYSTATIN powder Apply 1 application topically 3 (three) times daily as needed (blistering sores under breast).  05/23/17  Yes [provider]  oxyCODONE-acetaminophen (PERCOCET) 10-325 MG tablet Take 1 tablet by mouth 4 (four) times daily. Hold for sedation    Yes [provider]  prazosin (MINIPRESS) 2 MG capsule Take 2 mg by mouth at bedtime.  05/03/18  Yes [provider]  promethazine (PHENERGAN) 25 MG tablet Take 25 mg by mouth every 8 (eight) hours as needed for nausea/vomiting.  04/15/17  Yes [provider]  tiZANidine (ZANAFLEX) 4 MG tablet Take 4 mg by mouth 3 (three) times daily. 04/02/18  Yes [provider]  benzonatate (TESSALON) 100 MG capsule Take 1 capsule (100 mg total) by mouth 3 (three) times daily as needed for cough. Patient not taking: Reported on 08/13/2017 04/28/17   Narda BondsNettey, Ralph A, MD  busPIRone (BUSPAR) 10 MG tablet Take 1 tablet (10 mg total) by mouth 2 (two) times daily. Patient not taking: Reported on 07/13/2018 01/09/18   Thresa RossAkhtar, Nadeem, MD  busPIRone (BUSPAR) 10 MG tablet TAKE 1 TABLET(10 MG) BY MOUTH TWICE DAILY Patient not taking: Reported on 05/17/2018 02/23/18   Thresa RossAkhtar, Nadeem, MD  citalopram  (CELEXA) 20 MG tablet Take 1.5 tablets (30 mg total) by mouth daily. Patient not taking: Reported on 07/13/2018 01/09/18   Thresa RossAkhtar, Nadeem, MD  cyclobenzaprine (FLEXERIL) 10 MG tablet Take 1 tablet (10 mg total) by mouth 2 (two) times daily as needed for muscle spasms. Patient not taking: Reported on 07/13/2018 09/06/17   Alvira MondaySchlossman, Erin, MD  dextromethorphan-guaiFENesin [facility](MUCINEX DM) 30-600 MG 12hr tablet Take 1 tablet by mouth 2 (two) times daily. Patient not taking: Reported on 01/09/2018 04/28/17   Narda BondsNettey, Ralph A, MD  diazepam (VALIUM) 2 MG tablet Take 1 tablet (2 mg total) by mouth every 4 (four) hours as needed for muscle spasms. Patient not taking: Reported on 05/17/2018 03/02/18   Lorre NickAllen, Anthony, MD  gabapentin (NEURONTIN) 100 MG capsule Take 2 capsules (200 mg total) by mouth 3 (three) times daily. Patient not taking: Reported on 07/13/2018 07/31/17 02/10/18  Thresa RossAkhtar, Nadeem, MD  LORazepam (ATIVAN) 1 MG tablet Take 1 tablet (1 mg total) by mouth 3 (three) times daily as needed for anxiety (or nausea). Patient not taking: Reported on 07/13/2018 07/24/17   Shaune PollackIsaacs, Cameron, MD  nicotine (NICODERM CQ - DOSED IN MG/24 HR) 7 mg/24hr patch Place 1 patch (7 mg total) onto the skin daily. Patient not taking: Reported on 07/19/2018 04/29/17   Narda BondsNettey, Ralph A, MD  OLANZapine zydis (ZYPREXA) 5 MG disintegrating tablet DISSOLVE 1 TABLET(5 MG) ON THE TONGUE AT BEDTIME Patient not taking: Reported on 07/13/2018 02/22/18   Thresa RossAkhtar, Nadeem, MD  OLANZapine zydis (ZYPREXA) 5 MG disintegrating tablet Take 1 tablet (5 mg total) by mouth at bedtime. Patient not taking: Reported on 07/13/2018 01/09/18   Thresa RossAkhtar, Nadeem, MD  polyethylene glycol [facility](MIRALAX / GLYCOLAX) packet Take 17 g by mouth daily. Patient not taking: Reported on 01/09/2018 04/28/17   Narda BondsNettey, Ralph A, MD  amphetamine-dextroamphetamine (ADDERALL) 30 MG tablet Take 30 mg by mouth 2 (two) times daily.  10/02/15  [provider]  FLUoxetine (PROZAC) 40 MG capsule Take 1  capsule (40 mg total) by mouth daily. 10/02/15 01/10/17  Thresa RossAkhtar, Nadeem, MD  pregabalin (LYRICA) 225 MG capsule Take 225 mg by mouth 2 (two) times daily.  01/10/17  [provider]    Physical  Exam: Vitals:   07/19/18 2100 07/19/18 2315 07/20/18 0009 07/20/18 0109  BP:  (!) 165/105 (!) 167/112 (!) 162/113  Pulse: 99 (!) 110 (!) 113 (!) 111  Resp: 15 15 17 18   Temp:    98.9 F (37.2 C)  TempSrc:    Oral  SpO2: 96% 95% 97% 94%  Weight:    99.6 kg  Height:    5\' 6"  (1.676 m)      Constitutional: Moderately built and nourished. Vitals:   07/19/18 2100 07/19/18 2315 07/20/18 0009 07/20/18 0109  BP:  (!) 165/105 (!) 167/112 (!) 162/113  Pulse: 99 (!) 110 (!) 113 (!) 111  Resp: 15 15 17 18   Temp:    98.9 F (37.2 C)  TempSrc:    Oral  SpO2: 96% 95% 97% 94%  Weight:    99.6 kg  Height:    5\' 6"  (1.676 m)   Eyes: Anicteric no pallor. ENMT: No discharge from the ears eyes nose or mouth. Neck: No mass felt.  No neck rigidity.  No JVD appreciated. Respiratory: No rhonchi or crepitations. Cardiovascular: S1-S2 heard no murmurs appreciated. Abdomen: Distended bowel sounds not appreciated no guarding or rigidity no rebound tenderness. Musculoskeletal: No edema.  No joint effusion. Skin: No rash. Neurologic: Alert awake oriented to time place and person.  Moves all extremities. Psychiatric: Appears normal.  Normal affect.   Labs on Admission: I have personally reviewed following labs and imaging studies  CBC: Recent Labs  Lab 07/13/18 1306 07/19/18 1901  WBC 6.2 11.7*  HGB 13.7 15.6*  HCT 42.5 48.5*  MCV 88.4 86.6  PLT 395 393   Basic Metabolic Panel: Recent Labs  Lab 07/13/18 1306 07/19/18 1901  NA 140 141  K 3.4* 3.7  CL 101 100  CO2 29 28  GLUCOSE 160* 150*  BUN 7 15  CREATININE 1.46* 2.48*  CALCIUM 9.3 9.2   GFR: Estimated Creatinine Clearance: 30.5 mL/min (A) (by C-G formula based on SCr of 2.48 mg/dL (H)). Liver Function Tests: Recent Labs  Lab  07/13/18 1306 07/19/18 1901  AST 40 48*  ALT 40 47*  ALKPHOS 83 95  BILITOT 0.9 0.7  PROT 6.7 7.2  ALBUMIN 3.7 4.1   Recent Labs  Lab 07/19/18 1901  LIPASE 37   No results for input(s): AMMONIA in the last 168 hours. Coagulation Profile: No results for input(s): INR, PROTIME in the last 168 hours. Cardiac Enzymes: No results for input(s): CKTOTAL, CKMB, CKMBINDEX, TROPONINI in the last 168 hours. BNP (last 3 results) No results for input(s): PROBNP in the last 8760 hours. HbA1C: No results for input(s): HGBA1C in the last 72 hours. CBG: No results for input(s): GLUCAP in the last 168 hours. Lipid Profile: No results for input(s): CHOL, HDL, LDLCALC, TRIG, CHOLHDL, LDLDIRECT in the last 72 hours. Thyroid Function Tests: No results for input(s): TSH, T4TOTAL, FREET4, T3FREE, THYROIDAB in the last 72 hours. Anemia Panel: No results for input(s): VITAMINB12, FOLATE, FERRITIN, TIBC, IRON, RETICCTPCT in the last 72 hours. Urine analysis:    Component Value Date/Time   COLORURINE YELLOW 07/19/2018 2048   APPEARANCEUR HAZY (A) 07/19/2018 2048   LABSPEC 1.016 07/19/2018 2048   PHURINE 5.0 07/19/2018 2048   GLUCOSEU 50 (A) 07/19/2018 2048   HGBUR SMALL (A) 07/19/2018 2048   BILIRUBINUR NEGATIVE 07/19/2018 2048   KETONESUR NEGATIVE 07/19/2018 2048   PROTEINUR 30 (A) 07/19/2018 2048   NITRITE NEGATIVE 07/19/2018 2048   LEUKOCYTESUR TRACE (A) 07/19/2018 2048   Sepsis  Labs: @LABRCNTIP (procalcitonin:4,lacticidven:4) )No results found for this or any previous visit (from the past 240 hour(s)).   Radiological Exams on Admission: Dg Abd Acute W/chest  Result Date: 07/19/2018 CLINICAL DATA:  Abdominal pain with nausea and vomiting EXAM: DG ABDOMEN ACUTE W/ 1V CHEST COMPARISON:  Chest radiograph May 17, 2018; CT abdomen and pelvis December 16, 2017 FINDINGS: PA chest: There is atelectatic change in the left base. The lungs elsewhere clear. Heart size and pulmonary vascularity are  normal. No adenopathy. There is postoperative change in the lower cervical region. Supine and upright abdomen: There are loops of mildly dilated small bowel without appreciable air-fluid level. No evident free air. There are surgical clips in the right upper abdomen. There are phleboliths in the pelvis as well as a small calcified uterine leiomyoma. IMPRESSION: Loops of mildly dilated small bowel without air-fluid levels. Question enteritis or early ileus. Bowel obstruction felt to be less likely. No free air. There is left base atelectasis. Lungs elsewhere clear. There is a small calcified uterine leiomyoma. Electronically Signed   By: Bretta Bang III M.D.   On: 07/19/2018 21:46      Assessment/Plan Principal Problem:   ARF (acute renal failure) (HCC) Active Problems:   Obstructive apnea   Paralytic ileus (HCC)   Hypertensive urgency   Nausea & vomiting    1. Intractable nausea vomiting and diarrhea with possible paralytic ileus -since patient's abdomen is distended and I do not appreciate any bowel sounds I have placed patient on NG tube.  We will continue with hydration and repeat lactate levels.  Check KUB in a.m.  Will get surgical consult. 2. Acute renal failure likely from persistent vomiting and diarrhea.  Patient also uses NSAIDs which I advised patient not to do.  Aggressively hydrate and recheck metabolic panel closely follow intake output. 3. Hypertensive urgency likely from unable to take medications.  Will hold hydrochlorothiazide due to renal failure will continue amlodipine if patient can swallow and keep patient on hydralazine. 4. Chronic pain on long-acting morphine tablets.  Likely causing possible ileus also. 5. COPD not actively wheezing. 6. Possible UTI on Cipro for now.  Check urine cultures.   DVT prophylaxis: SCDs. Code Status: Full code. Family Communication: Discussed with patient. Disposition Plan: Home. Consults called: We will consult surgery. Admission  status: Observation.   Eduard Clos MD [facility] Pager [phone].  If 7PM-7AM, please contact night-coverage [email or web address] Password [email or web address]  07/20/2018, 2:38 AM

## 2018-07-20 NOTE — ED Notes (Signed)
Dr. Octavio GravesKrakandy paged and made aware about patient's BP and pt's constant nausea.

## 2018-07-20 NOTE — Progress Notes (Signed)
CRITICAL VALUE ALERT  Critical Value: Lactate 4.6  Date & Time Notied:  07/20/2018 0245  Provider Notified: Merdis DelayK. Schorr  Orders Received/Actions taken: Continue to monitor

## 2018-07-20 NOTE — Progress Notes (Addendum)
Initial Nutrition Assessment  DOCUMENTATION CODES:   Non-severe (moderate) malnutrition in context of acute illness/injury  INTERVENTION:    Boost Breeze po TID, each supplement provides 250 kcal and 9 grams of protein  NUTRITION DIAGNOSIS:   Moderate Malnutrition related to acute illness(likely ileus, nausea, vomiting & diarrhea) as evidenced by mild muscle depletion, mild fat depletion, energy intake < 75% for > 7 days  GOAL:   Patient will meet greater than or equal to 90% of their needs  MONITOR:   PO intake, Supplement acceptance, Labs, Weight trends, Skin, I & O's  REASON FOR ASSESSMENT:   Malnutrition Screening Tool  ASSESSMENT:   57 y.o. Female with COPD, anxiety, depression, and fibromyalgia who presented to the ED at the request of her PCP due to an unusual finding on CT scan; concern for SBO.  RD spoke with pt at bedside this AM. Has NGT to LIS. Reports she's had poor PO intake with vomiting for 3 weeks PTA. Was tolerating fruit, jello and popsicles. States "anytime I'd see a food commercial I felt sick".  Reveals she does not meet critera for bariatric surgery anymore given her unintentional weight loss. Says she's had an approximate 60 lb weight loss (21%) in the last 2 months. Labs and medications reviewed. K 3.1 (L).  Surgery note reviewed. Likely ileus. Recommending GI consult.  NUTRITION - FOCUSED PHYSICAL EXAM:    Most Recent Value  Orbital Region  No depletion  Upper Arm Region  Mild depletion  Thoracic and Lumbar Region  Unable to assess  Buccal Region  Mild depletion  Temple Region  Mild depletion  Clavicle Bone Region  Mild depletion  Clavicle and Acromion Bone Region  Mild depletion  Scapular Bone Region  Unable to assess  Dorsal Hand  Unable to assess  Patellar Region  Mild depletion  Anterior Thigh Region  Mild depletion  Posterior Calf Region  Mild depletion  Edema (RD Assessment)  None     Diet Order:   Diet Order           Diet clear liquid Room service appropriate? Yes; Fluid consistency: Thin  Diet effective now             EDUCATION NEEDS:   No education needs have been identified at this time  Skin:  Skin Assessment: Reviewed RN Assessment  Last BM:  8/8  Height:   Ht Readings from Last 1 Encounters:  07/20/18 5\' 6"  (1.676 m)   Weight:   Wt Readings from Last 1 Encounters:  07/20/18 99.6 kg   BMI:  Body mass index is 35.44 kg/m.  Estimated Nutritional Needs:   Kcal:  1800-2000  Protein:  90-105 gm  Fluid:  1.8-2.0 L  Maureen ChattersKatie Somer Trotter, RD, LDN Pager #: [phone] After-Hours Pager #: [phone]

## 2018-07-20 NOTE — Consult Note (Addendum)
[facility] Surgery Consult Note  Deanna Yang 04/11/1961  [id number].    Requesting MD: Hal Hope  Chief Complaint/Reason for Consult: SBO HPI:  Patient is a 57 year old female who presented to [facility] because she had a CT scan that was concerning for PSBO from [place] [facility] office. Imaging report viewed and stated concern for PSBO but incompletely imaged. Patient reports several month history of nausea, vomiting, abdominal pain. Has lost 16 lbs in the last week. Has 3-4 BMs daily. Patient reports history of GI issues dating back to when she was 57 years old. She does not see a GI specialist but is in the process of being referred to one. Past abdominal surgeries include 5 cesarean sections, appendectomy, cholecystectomy and hernia repair. On 3.5 L of supplemental O2 at home for COPD.   ROS: Review of Systems  Constitutional: Positive for weight loss. Negative for chills and fever.  Respiratory: Negative for shortness of breath and wheezing.   Cardiovascular: Negative for chest pain and palpitations.  Gastrointestinal: Positive for abdominal pain, nausea and vomiting. Negative for blood in stool, constipation and melena.  Genitourinary: Negative for dysuria, frequency and urgency.  Musculoskeletal: Positive for joint pain (R knee).  Psychiatric/Behavioral: The patient is nervous/anxious.   All other systems reviewed and are negative.   Family History  Problem Relation Age of Onset  . Alcohol abuse Mother   . Alcohol abuse Father   . Depression Sister   . Dementia Neg Hx     Past Medical History:  Diagnosis Date  . Anxiety   . COPD (chronic obstructive pulmonary disease) ([place])   . Depression   . Fibromyalgia   . Hypertension   . Hypertension   . Manic depression ([place])   . Neuropathy   . OSA (obstructive sleep apnea)     Past Surgical History:  Procedure Laterality Date  . ABDOMINAL SURGERY    . APPENDECTOMY    . BACK SURGERY    . BREAST REDUCTION SURGERY    . BREAST  SURGERY    . CARPAL TUNNEL RELEASE Bilateral   . CESAREAN SECTION    . CHOLECYSTECTOMY    . HERNIA REPAIR    . JOINT REPLACEMENT    . KNEE SURGERY    . TONSILLECTOMY      Social History:  reports that she quit smoking about 18 months ago. Her smoking use included cigarettes. She smoked 1.00 pack per day. She has never used smokeless tobacco. She reports that she does not drink alcohol or use drugs.  Allergies:  Allergies  Allergen Reactions  . Ambien [Zolpidem Tartrate] Shortness Of Breath and Swelling    Tongue swelling, throat swelling.   . Beta Adrenergic Blockers Anaphylaxis  . Fish Allergy Shortness Of Breath and Rash  . Iodinated Diagnostic Agents Itching    Benadryl 50MG prophylaxis before and after and patient reports she did fine  . Latex Itching and Other (See Comments)    Blisters skin  . Metoprolol Other (See Comments)    ANY MEDICATION THAT ENDS IN -OLOL-   The patient is asthmatic.  Marland Kitchen Penicillins Shortness Of Breath, Rash and Other (See Comments)    Has patient had a PCN reaction causing immediate rash, facial/tongue/throat swelling, SOB or lightheadedness with hypotension: Yes Has patient had a PCN reaction causing severe rash involving mucus membranes or skin necrosis: Unknown Has patient had a PCN reaction that required hospitalization No Has patient had a PCN reaction occurring within the last 10 years: No If  all of the above answers are "NO", then may proceed with Cephalosporin use.   . Red Dye Hives and Shortness Of Breath  . Shellfish Allergy Anaphylaxis  . Sulfa Antibiotics Hives and Shortness Of Breath  . Amoxicillin-Pot Clavulanate Nausea And Vomiting  . Aspirin Other (See Comments)    Blisters on tongue from higher doses - tolerates 81 mg   . Dye Fdc Red  [Amaranth (Fd&C Red #2)] Hives    X ray DYE  (BENADRYL 50 MG PROPHYLAXIS AND AFTER AND  SHE DID FINE, PER PATIENT.).  Marland Kitchen Tape Dermatitis and Rash    Adhesive   . Talwin [Pentazocine] Other (See  Comments)    Unknown reaction  . Levofloxacin Rash and Other (See Comments)    headache  . Seroquel [Quetiapine Fumarate] Other (See Comments)    hallucinate    Medications Prior to Admission  Medication Sig Dispense Refill  . acyclovir ointment (ZOVIRAX) 5 % Apply 1 application topically every 3 (three) hours as needed (mouth sores).   3  . albuterol (PROAIR HFA) 108 (90 Base) MCG/ACT inhaler Inhale 2 puffs into the lungs every 4 (four) hours as needed for wheezing.    Marland Kitchen amLODipine (NORVASC) 10 MG tablet Take 10 mg by mouth daily.     . budesonide-formoterol (SYMBICORT) 160-4.5 MCG/ACT inhaler Inhale 2 puffs into the lungs 2 (two) times daily.    Marland Kitchen buPROPion (WELLBUTRIN XL) 300 MG 24 hr tablet Take 1 tablet (300 mg total) by mouth daily. 30 tablet 1  . busPIRone (BUSPAR) 15 MG tablet Take 15 mg by mouth 2 (two) times daily as needed (panic attacks).   3  . cetirizine (ZYRTEC) 10 MG tablet Take 10 mg by mouth daily.    . citalopram (CELEXA) 40 MG tablet Take 40 mg by mouth daily.  0  . fluticasone (FLONASE) 50 MCG/ACT nasal spray Place 1 spray into both nostrils 2 (two) times daily.  0  . gabapentin (NEURONTIN) 600 MG tablet Take 1,800 mg by mouth at bedtime.   2  . hydrochlorothiazide (HYDRODIURIL) 25 MG tablet Take 25 mg by mouth daily.  3  . hydrOXYzine (ATARAX/VISTARIL) 25 MG tablet Take 1 tablet (25 mg total) by mouth every 6 (six) hours as needed for anxiety (mild anxiety). (Patient taking differently: Take 25 mg by mouth every 6 (six) hours as needed for anxiety (panic attacks). ) 20 tablet 0  . ibuprofen (ADVIL,MOTRIN) 200 MG tablet Take 600-800 mg by mouth every 6 (six) hours as needed for headache (pain).     . Immune Globulin 10% (GAMMAGARD) 10 GM/100ML SOLN Inject into the vein every 30 (thirty) days.    Marland Kitchen ipratropium-albuterol (DUONEB) 0.5-2.5 (3) MG/3ML SOLN Inhale 3 mLs into the lungs every 4 (four) hours as needed for wheezing or shortness of breath.  11  . montelukast  (SINGULAIR) 10 MG tablet Take 10 mg by mouth at bedtime.   5  . morphine (MS CONTIN) 30 MG 12 hr tablet Take 1 tablet (30 mg total) by mouth every 12 (twelve) hours. 10 tablet 0  . NYSTATIN powder Apply 1 application topically 3 (three) times daily as needed (blistering sores under breast).   3  . oxyCODONE-acetaminophen (PERCOCET) 10-325 MG tablet Take 1 tablet by mouth 4 (four) times daily. Hold for sedation     . prazosin (MINIPRESS) 2 MG capsule Take 2 mg by mouth at bedtime.   1  . promethazine (PHENERGAN) 25 MG tablet Take 25 mg by mouth every 8 (  eight) hours as needed for nausea/vomiting.   4  . tiZANidine (ZANAFLEX) 4 MG tablet Take 4 mg by mouth 3 (three) times daily.  2  . benzonatate (TESSALON) 100 MG capsule Take 1 capsule (100 mg total) by mouth 3 (three) times daily as needed for cough. (Patient not taking: Reported on 08/13/2017)    . busPIRone (BUSPAR) 10 MG tablet Take 1 tablet (10 mg total) by mouth 2 (two) times daily. (Patient not taking: Reported on 07/13/2018) 60 tablet 1  . busPIRone (BUSPAR) 10 MG tablet TAKE 1 TABLET(10 MG) BY MOUTH TWICE DAILY (Patient not taking: Reported on 05/17/2018) 60 tablet 0  . citalopram (CELEXA) 20 MG tablet Take 1.5 tablets (30 mg total) by mouth daily. (Patient not taking: Reported on 07/13/2018) 45 tablet 1  . cyclobenzaprine (FLEXERIL) 10 MG tablet Take 1 tablet (10 mg total) by mouth 2 (two) times daily as needed for muscle spasms. (Patient not taking: Reported on 07/13/2018) 8 tablet 0  . dextromethorphan-guaiFENesin (MUCINEX DM) 30-600 MG 12hr tablet Take 1 tablet by mouth 2 (two) times daily. (Patient not taking: Reported on 01/09/2018)    . diazepam (VALIUM) 2 MG tablet Take 1 tablet (2 mg total) by mouth every 4 (four) hours as needed for muscle spasms. (Patient not taking: Reported on 05/17/2018) 20 tablet 0  . gabapentin (NEURONTIN) 100 MG capsule Take 2 capsules (200 mg total) by mouth 3 (three) times daily. (Patient not taking: Reported on 07/13/2018)  42 capsule 0  . LORazepam (ATIVAN) 1 MG tablet Take 1 tablet (1 mg total) by mouth 3 (three) times daily as needed for anxiety (or nausea). (Patient not taking: Reported on 07/13/2018) 15 tablet 0  . nicotine (NICODERM CQ - DOSED IN MG/24 HR) 7 mg/24hr patch Place 1 patch (7 mg total) onto the skin daily. (Patient not taking: Reported on 07/19/2018)    . OLANZapine zydis (ZYPREXA) 5 MG disintegrating tablet DISSOLVE 1 TABLET(5 MG) ON THE TONGUE AT BEDTIME (Patient not taking: Reported on 07/13/2018) 30 tablet 0  . OLANZapine zydis (ZYPREXA) 5 MG disintegrating tablet Take 1 tablet (5 mg total) by mouth at bedtime. (Patient not taking: Reported on 07/13/2018) 30 tablet 0  . polyethylene glycol (MIRALAX / GLYCOLAX) packet Take 17 g by mouth daily. (Patient not taking: Reported on 01/09/2018)  0    Blood pressure (!) 162/113, pulse (!) 111, temperature 98.9 F (37.2 C), temperature source Oral, resp. rate 18, height _0  (1.676 m), weight 99.6 kg, SpO2 95 %. Physical Exam: Physical Exam  Constitutional: She is oriented to person, place, and time. She appears well-developed and well-nourished. She is cooperative.  Non-toxic appearance. No distress.  HENT:  Head: Normocephalic and atraumatic.  Right Ear: External ear normal.  Left Ear: External ear normal.  Mouth/Throat: Mucous membranes are normal.  Nasogastric tube present  Eyes: Conjunctivae, EOM and lids are normal. No scleral icterus.  Pupils equal and round  Neck: Normal range of motion and phonation normal. Neck supple.  Cardiovascular: Normal rate and regular rhythm.  Pulses:      Radial pulses are 2+ on the right side, and 2+ on the left side.       Dorsalis pedis pulses are 2+ on the right side, and 2+ on the left side.  No LE edema   Pulmonary/Chest: Effort normal and breath sounds normal.  Abdominal: Soft. She exhibits distension (mildly ). There is tenderness (minimal TTP ). There is no rigidity, no rebound and no guarding.  Musculoskeletal:  ROM slightly limited in R knee due to pain, surgical scar L knee. ROM grossly intact in bilateral upper and lower extremities  Neurological: She is alert and oriented to person, place, and time.  Skin: Skin is warm, dry and intact.  Psychiatric: Her speech is normal and behavior is normal. Her mood appears anxious.    Results for orders placed or performed during the hospital encounter of 07/19/18 (from the past 48 hour(s))  Lipase, blood     Status: None   Collection Time: 07/19/18  7:01 PM  Result Value Ref Range   Lipase 37 11 - 51 U/L    Comment: Performed at [facility], [address], [place], [place] [address]  Comprehensive metabolic panel     Status: Abnormal   Collection Time: 07/19/18  7:01 PM  Result Value Ref Range   Sodium 141 135 - 145 mmol/L   Potassium 3.7 3.5 - 5.1 mmol/L   Chloride 100 98 - 111 mmol/L   CO2 28 22 - 32 mmol/L   Glucose, Bld 150 (H) 70 - 99 mg/dL   BUN 15 6 - 20 mg/dL   Creatinine, Ser 2.48 (H) 0.44 - 1.00 mg/dL   Calcium 9.2 8.9 - 10.3 mg/dL   Total Protein 7.2 6.5 - 8.1 g/dL   Albumin 4.1 3.5 - 5.0 g/dL   AST 48 (H) 15 - 41 U/L   ALT 47 (H) 0 - 44 U/L   Alkaline Phosphatase 95 38 - 126 U/L   Total Bilirubin 0.7 0.3 - 1.2 mg/dL   GFR calc non Af Amer 21 (L) >60 mL/min   GFR calc Af Amer 24 (L) >60 mL/min    Comment: (NOTE) The eGFR has been calculated using the CKD EPI equation. This calculation has not been validated in all clinical situations. eGFR's persistently <60 mL/min signify possible Chronic Kidney Disease.    Anion gap 13 5 - 15    Comment: Performed at [facility] [address] [place], [place] [address]  CBC     Status: Abnormal   Collection Time: 07/19/18  7:01 PM  Result Value Ref Range   WBC 11.7 (H) 4.0 - 10.5 K/uL   RBC 5.60 (H) 3.87 - 5.11 MIL/uL   Hemoglobin 15.6 (H) 12.0 - 15.0 g/dL   HCT 48.5 (H) 36.0 - 46.0 %   MCV 86.6 78.0 - 100.0 fL   MCH 27.9 26.0 - 34.0 pg   MCHC 32.2  30.0 - 36.0 g/dL   RDW 13.4 11.5 - 15.5 %   Platelets 393 150 - 400 K/uL    Comment: Performed at [facility] [address] [place], [place] [address]  I-Stat beta hCG blood, ED     Status: None   Collection Time: 07/19/18  7:18 PM  Result Value Ref Range   I-stat hCG, quantitative <5.0 <5 mIU/mL   Comment 3            Comment:   GEST. AGE      CONC.  (mIU/mL)   <=1 WEEK        5 - 50     2 WEEKS       50 - 500     3 WEEKS       100 - 10,000     4 WEEKS     1,000 - 30,000        FEMALE AND NON-PREGNANT FEMALE:     LESS THAN  5 mIU/mL   Urinalysis, Routine w reflex microscopic     Status: Abnormal   Collection Time: 07/19/18  8:48 PM  Result Value Ref Range   Color, Urine YELLOW YELLOW   APPearance HAZY (A) CLEAR   Specific Gravity, Urine 1.016 1.005 - 1.030   pH 5.0 5.0 - 8.0   Glucose, UA 50 (A) NEGATIVE mg/dL   Hgb urine dipstick SMALL (A) NEGATIVE   Bilirubin Urine NEGATIVE NEGATIVE   Ketones, ur NEGATIVE NEGATIVE mg/dL   Protein, ur 30 (A) NEGATIVE mg/dL   Nitrite NEGATIVE NEGATIVE   Leukocytes, UA TRACE (A) NEGATIVE   RBC / HPF 0-5 0 - 5 RBC/hpf   WBC, UA 21-50 0 - 5 WBC/hpf   Bacteria, UA FEW (A) NONE SEEN   Squamous Epithelial / LPF 0-5 0 - 5   WBC Clumps PRESENT    Mucus PRESENT    Hyaline Casts, UA PRESENT     Comment: Performed at [facility], [place] [address] [place], [place] [address]  Lactic acid, plasma     Status: Abnormal   Collection Time: 07/20/18 12:52 AM  Result Value Ref Range   Lactic Acid, Venous 4.2 (HH) 0.5 - 1.9 mmol/L    Comment: CRITICAL RESULT CALLED TO, READ BACK BY AND VERIFIED WITHLevonne Lapping [phone] WILDERK Performed at [facility], [place] [address] [place], [place] [address]   Basic metabolic panel     Status: Abnormal   Collection Time: 07/20/18  2:56 AM  Result Value Ref Range   Sodium 143 135 - 145 mmol/L   Potassium 3.1 (L) 3.5 - 5.1 mmol/L   Chloride 101 98 - 111 mmol/L   CO2 26 22 - 32 mmol/L    Glucose, Bld 128 (H) 70 - 99 mg/dL   BUN 16 6 - 20 mg/dL   Creatinine, Ser 2.32 (H) 0.44 - 1.00 mg/dL   Calcium 9.1 8.9 - 10.3 mg/dL   GFR calc non Af Amer 23 (L) >60 mL/min   GFR calc Af Amer 26 (L) >60 mL/min    Comment: (NOTE) The eGFR has been calculated using the CKD EPI equation. This calculation has not been validated in all clinical situations. eGFR's persistently <60 mL/min signify possible Chronic Kidney Disease.    Anion gap 16 (H) 5 - 15    Comment: Performed at [facility], [place] [address] [place], [place] [address]  CBC     Status: Abnormal   Collection Time: 07/20/18  2:56 AM  Result Value Ref Range   WBC 13.9 (H) 4.0 - 10.5 K/uL   RBC 5.14 (H) 3.87 - 5.11 MIL/uL   Hemoglobin 14.4 12.0 - 15.0 g/dL   HCT 44.3 36.0 - 46.0 %   MCV 86.2 78.0 - 100.0 fL   MCH 28.0 26.0 - 34.0 pg   MCHC 32.5 30.0 - 36.0 g/dL   RDW 13.3 11.5 - 15.5 %   Platelets 391 150 - 400 K/uL    Comment: Performed at [facility], [place] [address], [place], [place] [address]  Hepatic function panel     Status: Abnormal   Collection Time: 07/20/18  2:56 AM  Result Value Ref Range   Total Protein 6.7 6.5 - 8.1 g/dL   Albumin 3.8 3.5 - 5.0 g/dL   AST 55 (H) 15 - 41 U/L   ALT 49 (H) 0 - 44 U/L   Alkaline Phosphatase 91 38 - 126 U/L   Total  Bilirubin 1.1 0.3 - 1.2 mg/dL   Bilirubin, Direct 0.2 0.0 - 0.2 mg/dL   Indirect Bilirubin 0.9 0.3 - 0.9 mg/dL    Comment: Performed at [facility], [place] [address] [place], [place] [address]  Lactic acid, plasma     Status: None   Collection Time: 07/20/18  5:00 AM  Result Value Ref Range   Lactic Acid, Venous 1.7 0.5 - 1.9 mmol/L    Comment: Performed at [facility] [address], [place], [place] [address]   Dg Abd Acute W/chest  Result Date: 07/19/2018 CLINICAL DATA:  Abdominal pain with nausea and vomiting EXAM: DG ABDOMEN ACUTE W/ 1V CHEST COMPARISON:  Chest radiograph May 17, 2018; CT abdomen and pelvis December 16, 2017  FINDINGS: PA chest: There is atelectatic change in the left base. The lungs elsewhere clear. Heart size and pulmonary vascularity are normal. No adenopathy. There is postoperative change in the lower cervical region. Supine and upright abdomen: There are loops of mildly dilated small bowel without appreciable air-fluid level. No evident free air. There are surgical clips in the right upper abdomen. There are phleboliths in the pelvis as well as a small calcified uterine leiomyoma. IMPRESSION: Loops of mildly dilated small bowel without air-fluid levels. Question enteritis or early ileus. Bowel obstruction felt to be less likely. No free air. There is left base atelectasis. Lungs elsewhere clear. There is a small calcified uterine leiomyoma. Electronically Signed   By: Lowella Grip III M.D.   On: 07/19/2018 21:46   Dg Abd Portable 1v  Result Date: 07/20/2018 CLINICAL DATA:  Nasogastric tube placement. EXAM: PORTABLE ABDOMEN - 1 VIEW COMPARISON:  Abdominal radiograph performed 07/19/2018 FINDINGS: The patient's enteric tube is seen ending overlying the body of the stomach, with the side port about the fundus of the stomach. The visualized bowel gas pattern is grossly unremarkable. Clips are noted at the right upper quadrant, reflecting prior cholecystectomy. The visualized portions of the lungs are grossly clear. No acute osseous abnormalities are seen. IMPRESSION: Enteric tube noted ending overlying the body of the stomach. Electronically Signed   By: Garald Balding M.D.   On: 07/20/2018 02:51      Assessment/Plan COPD HTN HLD Depression/Anxiety Fibromyalgia  Chronic nausea and vomiting Ileus - KUB this AM with normal bowel gas pattern since NGT placement, and abdominal exam benign - recommend NGT for n/v  - bowel rest - No acute surgical indications, we will sign off  - recommend GI consult  Brigid Re, [facility] Surgery 07/20/2018, 8:56 AM Pager: [phone] Consults:  [phone] Mon-Fri 7:00 am-4:30 pm Sat-Sun 7:00 am-11:30 am

## 2018-07-20 NOTE — Progress Notes (Signed)
PROGRESS NOTE    Deanna Yang   WJX:[id number]  DOB: 09-02-1961  DOA: 07/19/2018 PCP: Eartha Inch, MD   Brief Narrative:  Deanna Yang is a 56y/o female with PMH of chronic pain on narcotics, anxiety/depression, HTN, fibromyalgia, COPD on 3.5 L O2 at home who presents with vomiting x 1 wk. CT abd/pelvis 8/8> IMPRESSION:  Probable partial mid small bowel obstruction, incompletely imaged. Abd Xray in ED: Loops of mildly dilated small bowel without air-fluid levels.  Subjective: No nausea, abd pain, diarrhea, fevers, chills.  ROS: no complaints of nausea, vomiting, constipation diarrhea, cough, dyspnea or dysuria. No other complaints.   Assessment & Plan:   Principal Problem:   ARF (acute renal failure)  - baseline Cr about 1- 1.4- Cr now about 2.4 - cause uncertain- she has had vomiting but BUN/ Cr ratio does not suggest dehydration - ? Mild ATN- urine output appears adequate today- cont to follow I and O - will cont slow IVF and follow  Active Problems:  Nausea & vomiting-  Paralytic ileus  - Xray abd early AM shows a non-specific bowel gas pattern with no air fluid levels -she had good bowel sounds today with minimal abdominal distension- she is on narcotics chronically but is not constipated - NG clamped- will follow to see if she tolerates clears during clamping trial    Hypertensive urgency - home meds: Norvasc, HCTZ, Prazosin - currently on Norvasc and PRN IV Hydralazine- can resume Prazosin but cont to hold HCTZ for now  Lactic acidosis - improved with IVF- ? Due to vomiting  UTI - cont Cipro- culture ordered  Mild hypokalemia - replace and follow  Chronic pain/ Fibromyalgia - cont home medications  Anxiety/ depression - cont home meds    DVT prophylaxis: SCDs Code Status: Full code Family Communication:  Disposition Plan: cont clamping trial Consultants:   none Procedures:   NG tube Antimicrobials:  Anti-infectives (From admission, onward)    Start     Dose/Rate Route Frequency Ordered Stop   07/20/18 0730  ciprofloxacin (CIPRO) IVPB 400 mg     400 mg 200 mL/hr over 60 Minutes Intravenous Every 24 hours 07/20/18 0646         Objective: Vitals:   07/19/18 2100 07/19/18 2315 07/20/18 0009 07/20/18 0109  BP:  (!) 165/105 (!) 167/112 (!) 162/113  Pulse: 99 (!) 110 (!) 113 (!) 111  Resp: 15 15 17 18   Temp:    98.9 F (37.2 C)  TempSrc:    Oral  SpO2: 96% 95% 97% 94%  Weight:    99.6 kg  Height:    5\' 6"  (1.676 m)    Intake/Output Summary (Last 24 hours) at 07/20/2018 0726 Last data filed at 07/20/2018 0426 Gross per 24 hour  Intake 2090 ml  Output -  Net 2090 ml   Filed Weights   07/20/18 0109  Weight: 99.6 kg    Examination: General exam: Appears comfortable  HEENT: PERRLA, oral mucosa moist, no sclera icterus or thrush Respiratory system: Clear to auscultation. Respiratory effort normal. Cardiovascular system: S1 & S2 heard, RRR.   Gastrointestinal system: Abdomen soft, non-tender, nondistended. Normal bowel sound. No organomegaly Central nervous system: Alert and oriented. No focal neurological deficits. Extremities: No cyanosis, clubbing or edema Skin: No rashes or ulcers Psychiatry:  Mood & affect appropriate.     Data Reviewed: I have personally reviewed following labs and imaging studies  CBC: Recent Labs  Lab 07/13/18 1306 07/19/18 1901 07/20/18 0256  WBC  6.2 11.7* 13.9*  HGB 13.7 15.6* 14.4  HCT 42.5 48.5* 44.3  MCV 88.4 86.6 86.2  PLT 395 393 391   Basic Metabolic Panel: Recent Labs  Lab 07/13/18 1306 07/19/18 1901 07/20/18 0256  NA 140 141 143  K 3.4* 3.7 3.1*  CL 101 100 101  CO2 29 28 26   GLUCOSE 160* 150* 128*  BUN 7 15 16   CREATININE 1.46* 2.48* 2.32*  CALCIUM 9.3 9.2 9.1   GFR: Estimated Creatinine Clearance: 32.6 mL/min (A) (by C-G formula based on SCr of 2.32 mg/dL (H)). Liver Function Tests: Recent Labs  Lab 07/13/18 1306 07/19/18 1901 07/20/18 0256  AST 40  48* 55*  ALT 40 47* 49*  ALKPHOS 83 95 91  BILITOT 0.9 0.7 1.1  PROT 6.7 7.2 6.7  ALBUMIN 3.7 4.1 3.8   Recent Labs  Lab 07/19/18 1901  LIPASE 37   No results for input(s): AMMONIA in the last 168 hours. Coagulation Profile: No results for input(s): INR, PROTIME in the last 168 hours. Cardiac Enzymes: No results for input(s): CKTOTAL, CKMB, CKMBINDEX, TROPONINI in the last 168 hours. BNP (last 3 results) No results for input(s): PROBNP in the last 8760 hours. HbA1C: No results for input(s): HGBA1C in the last 72 hours. CBG: No results for input(s): GLUCAP in the last 168 hours. Lipid Profile: No results for input(s): CHOL, HDL, LDLCALC, TRIG, CHOLHDL, LDLDIRECT in the last 72 hours. Thyroid Function Tests: No results for input(s): TSH, T4TOTAL, FREET4, T3FREE, THYROIDAB in the last 72 hours. Anemia Panel: No results for input(s): VITAMINB12, FOLATE, FERRITIN, TIBC, IRON, RETICCTPCT in the last 72 hours. Urine analysis:    Component Value Date/Time   COLORURINE YELLOW 07/19/2018 2048   APPEARANCEUR HAZY (A) 07/19/2018 2048   LABSPEC 1.016 07/19/2018 2048   PHURINE 5.0 07/19/2018 2048   GLUCOSEU 50 (A) 07/19/2018 2048   HGBUR SMALL (A) 07/19/2018 2048   BILIRUBINUR NEGATIVE 07/19/2018 2048   KETONESUR NEGATIVE 07/19/2018 2048   PROTEINUR 30 (A) 07/19/2018 2048   NITRITE NEGATIVE 07/19/2018 2048   LEUKOCYTESUR TRACE (A) 07/19/2018 2048   Sepsis Labs: @LABRCNTIP (procalcitonin:4,lacticidven:4) )No results found for this or any previous visit (from the past 240 hour(s)).       Radiology Studies: Dg Abd Acute W/chest  Result Date: 07/19/2018 CLINICAL DATA:  Abdominal pain with nausea and vomiting EXAM: DG ABDOMEN ACUTE W/ 1V CHEST COMPARISON:  Chest radiograph May 17, 2018; CT abdomen and pelvis December 16, 2017 FINDINGS: PA chest: There is atelectatic change in the left base. The lungs elsewhere clear. Heart size and pulmonary vascularity are normal. No adenopathy.  There is postoperative change in the lower cervical region. Supine and upright abdomen: There are loops of mildly dilated small bowel without appreciable air-fluid level. No evident free air. There are surgical clips in the right upper abdomen. There are phleboliths in the pelvis as well as a small calcified uterine leiomyoma. IMPRESSION: Loops of mildly dilated small bowel without air-fluid levels. Question enteritis or early ileus. Bowel obstruction felt to be less likely. No free air. There is left base atelectasis. Lungs elsewhere clear. There is a small calcified uterine leiomyoma. Electronically Signed   By: Bretta BangWilliam  Woodruff III M.D.   On: 07/19/2018 21:46   Dg Abd Portable 1v  Result Date: 07/20/2018 CLINICAL DATA:  Nasogastric tube placement. EXAM: PORTABLE ABDOMEN - 1 VIEW COMPARISON:  Abdominal radiograph performed 07/19/2018 FINDINGS: The patient's enteric tube is seen ending overlying the body of the stomach, with the side  port about the fundus of the stomach. The visualized bowel gas pattern is grossly unremarkable. Clips are noted at the right upper quadrant, reflecting prior cholecystectomy. The visualized portions of the lungs are grossly clear. No acute osseous abnormalities are seen. IMPRESSION: Enteric tube noted ending overlying the body of the stomach. Electronically Signed   By: Roanna Raider M.D.   On: 07/20/2018 02:51      Scheduled Meds: . amLODipine  10 mg Oral Daily  . ipratropium-albuterol  3 mL Nebulization Q4H  . morphine  30 mg Oral Q12H   Continuous Infusions: . sodium chloride 100 mL/hr at 07/20/18 0321  . ciprofloxacin       LOS: 0 days    Time spent in minutes: 35    Calvert Cantor, MD [facility] Pager: [email or web address] Password [email or web address] 07/20/2018, 7:26 AM

## 2018-07-20 NOTE — Progress Notes (Signed)
Pharmacy Antibiotic Note  Deanna MansDolores Yang is a 57 y.o. female  with UTI.  Pharmacy has been consulted for cipro dosing. -SCr= 2.48, CrCl ~ 30, WBC= 13.9, afebrile  Plan: -Cipro 400mg  IV q24hr -Will follow renal function, cultures and clinical progress    Height: 5\' 6"  (167.6 cm) Weight: 219 lb 9.3 oz (99.6 kg) IBW/kg (Calculated) : 59.3  Temp (24hrs), Avg:98.3 F (36.8 C), Min:98 F (36.7 C), Max:98.9 F (37.2 C)  Recent Labs  Lab 07/13/18 1306 07/19/18 1901 07/20/18 0052 07/20/18 0256 07/20/18 0500  WBC 6.2 11.7*  --  13.9*  --   CREATININE 1.46* 2.48*  --   --   --   LATICACIDVEN  --   --  4.2*  --  1.7    Estimated Creatinine Clearance: 30.5 mL/min (A) (by C-G formula based on SCr of 2.48 mg/dL (H)).    Allergies  Allergen Reactions  . Ambien [Zolpidem Tartrate] Shortness Of Breath and Swelling    Tongue swelling, throat swelling.   . Beta Adrenergic Blockers Anaphylaxis  . Fish Allergy Shortness Of Breath and Rash  . Iodinated Diagnostic Agents Itching    Benadryl 50MG  prophylaxis before and after and patient reports she did fine  . Latex Itching and Other (See Comments)    Blisters skin  . Metoprolol Other (See Comments)    ANY MEDICATION THAT ENDS IN -OLOL-   The patient is asthmatic.  Marland Kitchen. Penicillins Shortness Of Breath, Rash and Other (See Comments)    Has patient had a PCN reaction causing immediate rash, facial/tongue/throat swelling, SOB or lightheadedness with hypotension: Yes Has patient had a PCN reaction causing severe rash involving mucus membranes or skin necrosis: Unknown Has patient had a PCN reaction that required hospitalization No Has patient had a PCN reaction occurring within the last 10 years: No If all of the above answers are "NO", then may proceed with Cephalosporin use.   . Red Dye Hives and Shortness Of Breath  . Shellfish Allergy Anaphylaxis  . Sulfa Antibiotics Hives and Shortness Of Breath  . Amoxicillin-Pot Clavulanate Nausea And  Vomiting  . Aspirin Other (See Comments)    Blisters on tongue from higher doses - tolerates 81 mg   . Dye Fdc Red  [Amaranth (Fd&C Red #2)] Hives    X ray DYE  (BENADRYL 50 MG PROPHYLAXIS AND AFTER AND  SHE DID FINE, PER PATIENT.).  Marland Kitchen. Tape Dermatitis and Rash    Adhesive   . Talwin [Pentazocine] Other (See Comments)    Unknown reaction  . Levofloxacin Rash and Other (See Comments)    headache  . Seroquel [Quetiapine Fumarate] Other (See Comments)    hallucinate     Thank you for allowing pharmacy to be a part of this patient's care.  Harland GermanAndrew Nabeeha Badertscher, PharmD Clinical Pharmacist Please check Amion for pharmacy contact number

## 2018-07-21 ENCOUNTER — Inpatient Hospital Stay (HOSPITAL_COMMUNITY): Payer: Medicare Other

## 2018-07-21 DIAGNOSIS — E440 Moderate protein-calorie malnutrition: Secondary | ICD-10-CM

## 2018-07-21 DIAGNOSIS — R197 Diarrhea, unspecified: Secondary | ICD-10-CM

## 2018-07-21 DIAGNOSIS — R634 Abnormal weight loss: Secondary | ICD-10-CM

## 2018-07-21 DIAGNOSIS — R933 Abnormal findings on diagnostic imaging of other parts of digestive tract: Secondary | ICD-10-CM

## 2018-07-21 LAB — CBC
HCT: 42.2 % (ref 36.0–46.0)
Hemoglobin: 13.9 g/dL (ref 12.0–15.0)
MCH: 28.2 pg (ref 26.0–34.0)
MCHC: 32.9 g/dL (ref 30.0–36.0)
MCV: 85.6 fL (ref 78.0–100.0)
PLATELETS: 335 10*3/uL (ref 150–400)
RBC: 4.93 MIL/uL (ref 3.87–5.11)
RDW: 13.3 % (ref 11.5–15.5)
WBC: 9.4 10*3/uL (ref 4.0–10.5)

## 2018-07-21 LAB — BASIC METABOLIC PANEL
Anion gap: 12 (ref 5–15)
BUN: 8 mg/dL (ref 6–20)
CHLORIDE: 103 mmol/L (ref 98–111)
CO2: 26 mmol/L (ref 22–32)
Calcium: 8.5 mg/dL — ABNORMAL LOW (ref 8.9–10.3)
Creatinine, Ser: 1.21 mg/dL — ABNORMAL HIGH (ref 0.44–1.00)
GFR calc Af Amer: 57 mL/min — ABNORMAL LOW (ref >60)
GFR calc non Af Amer: 49 mL/min — ABNORMAL LOW (ref >60)
Glucose, Bld: 155 mg/dL — ABNORMAL HIGH (ref 70–99)
Potassium: 2.9 mmol/L — ABNORMAL LOW (ref 3.5–5.1)
SODIUM: 141 mmol/L (ref 135–145)

## 2018-07-21 LAB — MAGNESIUM: Magnesium: 1.3 mg/dL — ABNORMAL LOW (ref 1.7–2.4)

## 2018-07-21 LAB — URINE CULTURE: Culture: <10000 — AB

## 2018-07-21 MED ORDER — POTASSIUM CHLORIDE 20 MEQ/15ML (10%) PO SOLN
40.0000 meq | Freq: Once | ORAL | Status: DC
  Filled 2018-07-21: qty 30

## 2018-07-21 MED ORDER — METOPROLOL TARTRATE 5 MG/5ML IV SOLN
5.0000 mg | INTRAVENOUS | Status: DC | PRN
  Filled 2018-07-21: qty 5

## 2018-07-21 MED ORDER — MAGNESIUM SULFATE 2 GM/50ML IV SOLN
2.0000 g | Freq: Once | INTRAVENOUS | Status: AC
  Administered 2018-07-21: 2 g via INTRAVENOUS
  Filled 2018-07-21: qty 50

## 2018-07-21 MED ORDER — POTASSIUM CHLORIDE CRYS ER 20 MEQ PO TBCR
40.0000 meq | EXTENDED_RELEASE_TABLET | ORAL | Status: DC
  Administered 2018-07-21: 40 meq via ORAL
  Filled 2018-07-21: qty 2

## 2018-07-21 MED ORDER — SODIUM CHLORIDE 0.9 % IV SOLN: 1.0000 g | INTRAVENOUS | Status: DC

## 2018-07-21 MED ORDER — LORAZEPAM 2 MG/ML IJ SOLN
0.5000 mg | Freq: Once | INTRAMUSCULAR | Status: AC
  Administered 2018-07-21: 0.5 mg via INTRAVENOUS
  Filled 2018-07-21: qty 1

## 2018-07-21 MED ORDER — CIPROFLOXACIN IN D5W 400 MG/200ML IV SOLN
400.0000 mg | Freq: Two times a day (BID) | INTRAVENOUS | Status: DC
  Administered 2018-07-21 – 2018-07-22 (×2): 400 mg via INTRAVENOUS
  Filled 2018-07-21 (×2): qty 200

## 2018-07-21 MED ORDER — POTASSIUM CHLORIDE 10 MEQ/100ML IV SOLN
10.0000 meq | INTRAVENOUS | Status: AC
  Administered 2018-07-21 (×4): 10 meq via INTRAVENOUS
  Filled 2018-07-21 (×4): qty 100

## 2018-07-21 MED ORDER — POTASSIUM CHLORIDE 20 MEQ PO PACK
40.0000 meq | PACK | Freq: Two times a day (BID) | ORAL | Status: DC
  Filled 2018-07-21: qty 2

## 2018-07-21 MED ORDER — LORAZEPAM 2 MG/ML IJ SOLN
1.0000 mg | Freq: Four times a day (QID) | INTRAMUSCULAR | Status: AC | PRN
  Administered 2018-07-21 (×2): 1 mg via INTRAVENOUS
  Filled 2018-07-21 (×2): qty 1

## 2018-07-21 NOTE — Consult Note (Addendum)
Referring Provider: [facility]  Primary Care Physician:  Deanna InchBadger, Michael C, MD Primary Gastroenterologist:   unassigned  Reason for Consultation:  Nausea and vomiting    ASSESSMENT AND PLAN:     57 yo female with chronic "stomach problems"and several abdominal surgeries presenting with a few month history of intermittent nausea / vomiting loose stools and weight loss. CT scan a few days ago raises concern for SBO.  -plain films in hospital suggested more of an ileus picture and Surgery agrees with that. findings. She is really distended now and + flatus so would think ileus vrs resolving partial SBO. Plain films look better today but would like to get further imaging of small bowel. Will arrange for CT enterography (contrast through NGT)  -stool studies already pending.    Attending physician's note   I have taken an interval history, reviewed the chart and examined the patient. I agree with the Advanced Practitioner's note, impression and recommendations.   57 year old with COPD, depression, HTN, chronic abdominal pain attributed to IBS, multiple abdominal surgeries including ruptured appendix admitted with ileus v/s PSBO. Surgery following. Plan: Keep the NG in, IV PPIs, CT enterography (contrast through the NG tube).  No indication for any endoscopic procedures at this time.   Deanna Circleaj Lucy Woolever, MD    HPI: Deanna Yang is a 57 y.o. female with COPD, depression,  HTN, and chronic intermittent abdominal pain who gives a long history of "stomach problems" . She had a perforated appendix several years ago and says that since then she has received monthly infusions of immunoglobulins. Over the last few months patient has had intermittent vomiting and diarrhea. Not all stools are loose, some solid stools as well. No blood in stools. She has been losing about 16 pounds a month but has also had a lot of stress in her life lately. Her complaints of abdominal pain are vague, the N/V seem to be  the main problem. PCP with [facility] got a CT scan which showed air fluid levels in small bowel and raised come concern for small bowel obstruction and patient advised to go to ED for admission. She denies previous hx of bowel obstructions. In ED patient had distended abdomen and vomited large amount of vomitus. She has had several abdominal surgeries. She also mentions frequent loose stools. Having some solid stools as well, not all diarrhea and no blood in stools. Patient says she had a colonoscopy several years ago in [place], doesn't recall results.   Patient has an NGT in place. She has had significant output but also continues to drink liquids. No diarrhea today.     Past Medical History:  Diagnosis Date  . Anxiety   . COPD (chronic obstructive pulmonary disease) (HCC)   . Depression   . Fibromyalgia   . Hypertension   . Hypertension   . Manic depression (HCC)   . Neuropathy   . OSA (obstructive sleep apnea)     Past Surgical History:  Procedure Laterality Date  . ABDOMINAL SURGERY    . APPENDECTOMY    . BACK SURGERY    . BREAST REDUCTION SURGERY    . BREAST SURGERY    . CARPAL TUNNEL RELEASE Bilateral   . CESAREAN SECTION    . CHOLECYSTECTOMY    . HERNIA REPAIR    . JOINT REPLACEMENT    . KNEE SURGERY    . TONSILLECTOMY      Prior to Admission medications   Medication Sig Start Date End Date Taking? Authorizing  Provider  acyclovir ointment (ZOVIRAX) 5 % Apply 1 application topically every 3 (three) hours as needed (mouth sores).  01/27/18  Yes [provider]  albuterol (PROAIR HFA) 108 (90 Base) MCG/ACT inhaler Inhale 2 puffs into the lungs every 4 (four) hours as needed for wheezing. 06/30/17  Yes [provider]  amLODipine (NORVASC) 10 MG tablet Take 10 mg by mouth daily.  07/05/17  Yes [provider]  budesonide-formoterol (SYMBICORT) 160-4.5 MCG/ACT inhaler Inhale 2 puffs into the lungs 2 (two) times daily.   Yes [provider]    buPROPion (WELLBUTRIN XL) 300 MG 24 hr tablet Take 1 tablet (300 mg total) by mouth daily. 01/09/18  Yes Thresa Ross, MD  busPIRone (BUSPAR) 15 MG tablet Take 15 mg by mouth 2 (two) times daily as needed (panic attacks).  07/04/18  Yes [provider]  cetirizine (ZYRTEC) 10 MG tablet Take 10 mg by mouth daily.   Yes [provider]  citalopram (CELEXA) 40 MG tablet Take 40 mg by mouth daily. 06/18/18  Yes [provider]  fluticasone (FLONASE) 50 MCG/ACT nasal spray Place 1 spray into both nostrils 2 (two) times daily. 05/31/18  Yes [provider]  gabapentin (NEURONTIN) 600 MG tablet Take 1,800 mg by mouth at bedtime.  05/03/18  Yes [provider]  hydrochlorothiazide (HYDRODIURIL) 25 MG tablet Take 25 mg by mouth daily. 04/03/18  Yes [provider]  hydrOXYzine (ATARAX/VISTARIL) 25 MG tablet Take 1 tablet (25 mg total) by mouth every 6 (six) hours as needed for anxiety (mild anxiety). Patient taking differently: Take 25 mg by mouth every 6 (six) hours as needed for anxiety (panic attacks).  07/24/17  Yes Shaune Pollack, MD  ibuprofen (ADVIL,MOTRIN) 200 MG tablet Take 600-800 mg by mouth every 6 (six) hours as needed for headache (pain).    Yes [provider]  Immune Globulin 10% (GAMMAGARD) 10 GM/100ML SOLN Inject into the vein every 30 (thirty) days.   Yes [provider]  ipratropium-albuterol (DUONEB) 0.5-2.5 (3) MG/3ML SOLN Inhale 3 mLs into the lungs every 4 (four) hours as needed for wheezing or shortness of breath. 04/15/17  Yes [provider]  montelukast (SINGULAIR) 10 MG tablet Take 10 mg by mouth at bedtime.  05/01/18  Yes [provider]  morphine (MS CONTIN) 30 MG 12 hr tablet Take 1 tablet (30 mg total) by mouth every 12 (twelve) hours. 04/28/17  Yes Narda Bonds, MD  NYSTATIN powder Apply 1 application topically 3 (three) times daily as needed (blistering sores under breast).  05/23/17  Yes  [provider]  oxyCODONE-acetaminophen (PERCOCET) 10-325 MG tablet Take 1 tablet by mouth 4 (four) times daily. Hold for sedation    Yes [provider]  prazosin (MINIPRESS) 2 MG capsule Take 2 mg by mouth at bedtime.  05/03/18  Yes [provider]  promethazine (PHENERGAN) 25 MG tablet Take 25 mg by mouth every 8 (eight) hours as needed for nausea/vomiting.  04/15/17  Yes [provider]  tiZANidine (ZANAFLEX) 4 MG tablet Take 4 mg by mouth 3 (three) times daily. 04/02/18  Yes [provider]  benzonatate (TESSALON) 100 MG capsule Take 1 capsule (100 mg total) by mouth 3 (three) times daily as needed for cough. Patient not taking: Reported on 08/13/2017 04/28/17   Narda Bonds, MD  busPIRone (BUSPAR) 10 MG tablet Take 1 tablet (10 mg total) by mouth 2 (two) times daily. Patient not taking: Reported on 07/13/2018 01/09/18  Thresa Ross, MD  busPIRone (BUSPAR) 10 MG tablet TAKE 1 TABLET(10 MG) BY MOUTH TWICE DAILY Patient not taking: Reported on 05/17/2018 02/23/18   Thresa Ross, MD  citalopram (CELEXA) 20 MG tablet Take 1.5 tablets (30 mg total) by mouth daily. Patient not taking: Reported on 07/13/2018 01/09/18   Thresa Ross, MD  cyclobenzaprine (FLEXERIL) 10 MG tablet Take 1 tablet (10 mg total) by mouth 2 (two) times daily as needed for muscle spasms. Patient not taking: Reported on 07/13/2018 09/06/17   Alvira Monday, MD  dextromethorphan-guaiFENesin [facility] DM) 30-600 MG 12hr tablet Take 1 tablet by mouth 2 (two) times daily. Patient not taking: Reported on 01/09/2018 04/28/17   Narda Bonds, MD  diazepam (VALIUM) 2 MG tablet Take 1 tablet (2 mg total) by mouth every 4 (four) hours as needed for muscle spasms. Patient not taking: Reported on 05/17/2018 03/02/18   Lorre Nick, MD  gabapentin (NEURONTIN) 100 MG capsule Take 2 capsules (200 mg total) by mouth 3 (three) times daily. Patient not taking: Reported on 07/13/2018 07/31/17 02/10/18  Thresa Ross, MD  LORazepam (ATIVAN) 1 MG tablet Take 1 tablet (1 mg total) by mouth 3 (three) times daily as needed for anxiety (or nausea). Patient not taking: Reported on 07/13/2018 07/24/17   Shaune Pollack, MD  nicotine (NICODERM CQ - DOSED IN MG/24 HR) 7 mg/24hr patch Place 1 patch (7 mg total) onto the skin daily. Patient not taking: Reported on 07/19/2018 04/29/17   Narda Bonds, MD  OLANZapine zydis (ZYPREXA) 5 MG disintegrating tablet DISSOLVE 1 TABLET(5 MG) ON THE TONGUE AT BEDTIME Patient not taking: Reported on 07/13/2018 02/22/18   Thresa Ross, MD  OLANZapine zydis (ZYPREXA) 5 MG disintegrating tablet Take 1 tablet (5 mg total) by mouth at bedtime. Patient not taking: Reported on 07/13/2018 01/09/18   Thresa Ross, MD  polyethylene glycol [facility] / GLYCOLAX) packet Take 17 g by mouth daily. Patient not taking: Reported on 01/09/2018 04/28/17   Narda Bonds, MD  amphetamine-dextroamphetamine (ADDERALL) 30 MG tablet Take 30 mg by mouth 2 (two) times daily.  10/02/15  [provider]  FLUoxetine (PROZAC) 40 MG capsule Take 1 capsule (40 mg total) by mouth daily. 10/02/15 01/10/17  Thresa Ross, MD  pregabalin (LYRICA) 225 MG capsule Take 225 mg by mouth 2 (two) times daily.  01/10/17  [provider]    Current Facility-Administered Medications  Medication Dose Route Frequency Provider Last Rate Last Dose  . acetaminophen (TYLENOL) tablet 650 mg  650 mg Oral Q6H PRN Eduard Clos, MD       Or  . acetaminophen (TYLENOL) suppository 650 mg  650 mg Rectal Q6H PRN Eduard Clos, MD      . albuterol (PROVENTIL) (2.5 MG/3ML) 0.083% nebulizer solution 2.5 mg  2.5 mg Nebulization Q4H PRN Calvert Cantor, MD      . amLODipine (NORVASC) tablet 10 mg  10 mg Oral Daily Eduard Clos, MD   10 mg at 07/21/18 0809  . busPIRone (BUSPAR) tablet 15 mg  15 mg Oral BID PRN Calvert Cantor, MD      . ciprofloxacin (CIPRO) IVPB 400 mg  400 mg Intravenous Q12H Rizwan, Saima,  MD      . feeding supplement (BOOST / RESOURCE BREEZE) liquid 1 Container  1 Container Oral TID BM Calvert Cantor, MD   1 Container at 07/20/18 1610  . hydrALAZINE (APRESOLINE) injection 10 mg  10 mg Intravenous Q4H PRN Eduard Clos, MD  10 mg at 07/20/18 2222  . HYDROmorphone (DILAUDID) injection 1 mg  1 mg Intravenous Q2H PRN Eduard Clos, MD   1 mg at 07/20/18 0255  . ipratropium-albuterol (DUONEB) 0.5-2.5 (3) MG/3ML nebulizer solution 3 mL  3 mL Nebulization BID Calvert Cantor, MD   3 mL at 07/21/18 0755  . metoprolol tartrate (LOPRESSOR) injection 5 mg  5 mg Intravenous Q4H PRN Rizwan, Ladell Heads, MD      . montelukast (SINGULAIR) tablet 10 mg  10 mg Oral QHS Calvert Cantor, MD   10 mg at 07/20/18 2216  . morphine (MS CONTIN) 12 hr tablet 30 mg  30 mg Oral Q12H Schorr, Roma Kayser, NP   30 mg at 07/21/18 0809  . oxyCODONE-acetaminophen (PERCOCET/ROXICET) 5-325 MG per tablet 1 tablet  1 tablet Oral QID Calvert Cantor, MD   1 tablet at 07/21/18 1237   And  . oxyCODONE (Oxy IR/ROXICODONE) immediate release tablet 5 mg  5 mg Oral QID Calvert Cantor, MD   5 mg at 07/21/18 1237  . potassium chloride 20 MEQ/15ML (10%) solution 40 mEq  40 mEq Oral Once Calvert Cantor, MD      . prazosin (MINIPRESS) capsule 2 mg  2 mg Oral QHS Calvert Cantor, MD   2 mg at 07/20/18 2216  . promethazine (PHENERGAN) injection 12.5 mg  12.5 mg Intravenous Q6H PRN Eduard Clos, MD   12.5 mg at 07/21/18 1237    Allergies as of 07/19/2018 - Review Complete 07/19/2018  Allergen Reaction Noted  . Ambien [zolpidem tartrate] Shortness Of Breath and Swelling 06/18/2012  . Beta adrenergic blockers Anaphylaxis 12/11/2013  . Fish allergy Shortness Of Breath and Rash 06/05/2014  . Iodinated diagnostic agents Itching 10/20/2014  . Latex Itching and Other (See Comments) 04/01/2013  . Metoprolol Other (See Comments) 06/18/2012  . Penicillins Shortness Of Breath, Rash, and Other (See Comments) 05/30/2012  . Red dye  Hives and Shortness Of Breath 06/18/2012  . Shellfish allergy Anaphylaxis 10/14/2014  . Sulfa antibiotics Hives and Shortness Of Breath 05/30/2012  . Amoxicillin-pot clavulanate Nausea And Vomiting 07/07/2016  . Aspirin Other (See Comments) 07/07/2016  . Dye fdc red  [amaranth (fd&Yang red #2)] Hives 03/12/2013  . Tape Dermatitis and Rash 04/01/2013  . Talwin [pentazocine] Other (See Comments) 12/16/2017  . Levofloxacin Rash and Other (See Comments) 05/30/2012  . Seroquel [quetiapine fumarate] Other (See Comments) 07/17/2015    Family History  Problem Relation Age of Onset  . Alcohol abuse Mother   . Alcohol abuse Father   . Depression Sister   . Dementia Neg Hx     Social History   Socioeconomic History  . Marital status: Legally Separated    Spouse name: Not on file  . Number of children: Not on file  . Years of education: Not on file  . Highest education level: Not on file  Occupational History  . Not on file  Social Needs  . Financial resource strain: Not on file  . Food insecurity:    Worry: Not on file    Inability: Not on file  . Transportation needs:    Medical: Not on file    Non-medical: Not on file  Tobacco Use  . Smoking status: Former Smoker    Packs/day: 1.00    Types: Cigarettes    Last attempt to quit: 01/17/2017    Years since quitting: 1.5  . Smokeless tobacco: Never Used  Substance and Sexual Activity  . Alcohol use: No    Comment:  last use 2-3 years ago  . Drug use: No  . Sexual activity: Not Currently  Lifestyle  . Physical activity:    Days per week: Not on file    Minutes per session: Not on file  . Stress: Not on file  Relationships  . Social connections:    Talks on phone: Not on file    Gets together: Not on file    Attends religious service: Not on file    Active member of club or organization: Not on file    Attends meetings of clubs or organizations: Not on file    Relationship status: Not on file  . Intimate partner violence:     Fear of current or ex partner: Not on file    Emotionally abused: Not on file    Physically abused: Not on file    Forced sexual activity: Not on file  Other Topics Concern  . Not on file  Social History Narrative  . Not on file    Review of Systems: All systems reviewed and negative except where noted in HPI.  Physical Exam: Vital signs in last 24 hours: Temp:  [98 F (36.7 Yang)-98.5 F (36.9 Yang)] 98 F (36.7 Yang) (08/10 0814) Pulse Rate:  [92-128] 128 (08/10 0814) Resp:  [22] 22 (08/10 0814) BP: (151-185)/(93-116) 151/116 (08/10 0814) SpO2:  [92 %-96 %] 96 % (08/10 0814) Last BM Date: 07/19/18 General:   Alert, well-developed,  female in NAD Psych:  Pleasant, cooperative. Normal mood and affect. Eyes:  Pupils equal, sclera clear, no icterus.   Conjunctiva pink. Ears:  Normal auditory acuity. Nose:  No deformity, discharge,  or lesions. Neck:  Supple; no masses Lungs:  Clear throughout to auscultation.   No wheezes, crackles, or rhonchi.  Heart:  Regular rate and rhythm; no murmurs, no edema Abdomen:  Soft, non-distended, mild diffuse upper abdominal tenderness.  BS active, no palp mass    Rectal:  Deferred  Msk:  Symmetrical without gross deformities. . Neurologic:  Alert and  oriented x4;  grossly normal neurologically. Skin:  Intact without significant lesions or rashes..   Intake/Output from previous day: 08/09 0701 - 08/10 0700 In: 2301.7 [P.O.:480; I.V.:1621.7; IV Piggyback:200] Out: 2450 [Urine:1200; Emesis/NG output:1250] Intake/Output this shift: Total I/O In: 670 [P.O.:120; IV Piggyback:550] Out: 350 [Emesis/NG output:350]  Lab Results: Recent Labs    07/19/18 1901 07/20/18 0256 07/21/18 0304  WBC 11.7* 13.9* 9.4  HGB 15.6* 14.4 13.9  HCT 48.5* 44.3 42.2  PLT 393 391 335   BMET Recent Labs    07/19/18 1901 07/20/18 0256 07/21/18 0304  NA 141 143 141  K 3.7 3.1* 2.9*  CL 100 101 103  CO2 28 26 26   GLUCOSE 150* 128* 155*  BUN 15 16 8     CREATININE 2.48* 2.32* 1.21*  CALCIUM 9.2 9.1 8.5*   LFT Recent Labs    07/20/18 0256  PROT 6.7  ALBUMIN 3.8  AST 55*  ALT 49*  ALKPHOS 91  BILITOT 1.1  BILIDIR 0.2  IBILI 0.9   PT/INR No results for input(s): LABPROT, INR in the last 72 hours. Hepatitis Panel No results for input(s): HEPBSAG, HCVAB, HEPAIGM, HEPBIGM in the last 72 hours.    Studies/Results: Dg Abd Acute W/chest  Result Date: 07/19/2018 CLINICAL DATA:  Abdominal pain with nausea and vomiting EXAM: DG ABDOMEN ACUTE W/ 1V CHEST COMPARISON:  Chest radiograph May 17, 2018; CT abdomen and pelvis December 16, 2017 FINDINGS: PA chest: There is atelectatic change in  the left base. The lungs elsewhere clear. Heart size and pulmonary vascularity are normal. No adenopathy. There is postoperative change in the lower cervical region. Supine and upright abdomen: There are loops of mildly dilated small bowel without appreciable air-fluid level. No evident free air. There are surgical clips in the right upper abdomen. There are phleboliths in the pelvis as well as a small calcified uterine leiomyoma. IMPRESSION: Loops of mildly dilated small bowel without air-fluid levels. Question enteritis or early ileus. Bowel obstruction felt to be less likely. No free air. There is left base atelectasis. Lungs elsewhere clear. There is a small calcified uterine leiomyoma. Electronically Signed   By: Bretta Bang III M.D.   On: 07/19/2018 21:46   Dg Abd Portable 1v  Result Date: 07/20/2018 CLINICAL DATA:  Nasogastric tube placement. EXAM: PORTABLE ABDOMEN - 1 VIEW COMPARISON:  Abdominal radiograph performed 07/19/2018 FINDINGS: The patient's enteric tube is seen ending overlying the body of the stomach, with the side port about the fundus of the stomach. The visualized bowel gas pattern is grossly unremarkable. Clips are noted at the right upper quadrant, reflecting prior cholecystectomy. The visualized portions of the lungs are grossly clear.  No acute osseous abnormalities are seen. IMPRESSION: Enteric tube noted ending overlying the body of the stomach. Electronically Signed   By: Roanna Raider M.D.   On: 07/20/2018 02:51   Dg Abd Portable 2v  Result Date: 07/21/2018 CLINICAL DATA:  Evaluate vomiting EXAM: PORTABLE ABDOMEN - 2 VIEW COMPARISON:  July 20, 2018 FINDINGS: The NG tube terminates in the left upper quadrant, likely a stomach. Mild atelectasis in the left base. Lung bases otherwise normal. Cholecystectomy clips. No free air, portal venous gas, or pneumatosis. No evidence of bowel obstruction. Possible small calcified fibroid in the uterus. No other abnormalities. IMPRESSION: The NG tube is in good position. No cause for the patient's symptoms identified. Electronically Signed   By: Gerome Sam III M.D   On: 07/21/2018 10:09     Willette Cluster, NP-Yang @  07/21/2018, 4:01 PM

## 2018-07-21 NOTE — Progress Notes (Signed)
PROGRESS NOTE    Deanna Yang   ZOX:[id number]  DOB: 06-29-1961  DOA: 07/19/2018 PCP: Deanna Inch, MD   Brief Narrative:  Deanna Yang is a 56y/o female with PMH of chronic pain on narcotics, anxiety/depression, HTN, fibromyalgia, COPD on 3.5 L O2 at home who presents with vomiting x 1 wk. CT abd/pelvis 8/8> IMPRESSION:  Probable partial mid small bowel obstruction, incompletely imaged. Abd Xray in ED: Loops of mildly dilated small bowel without air-fluid levels.  Subjective: She began having vomiting last night and subsequently had dry heaves. She has diffuse upper abdominal pain. No BM.     Assessment & Plan:   Principal Problem:   ARF (acute renal failure)  - baseline Cr about 1- 1.4- Cr about 2.4 on admission- improved to 1.21 today - ? Mild ATN- urine output appears adequate  - cont to follow I and O - will cont slow IVF and follow  Active Problems:  Nausea & vomiting-  Paralytic ileus  - Xray abd early AM shows a non-specific bowel gas pattern with no air fluid levels -she had good bowel sounds today with minimal abdominal distension- she is on narcotics chronically but is not constipated - I will request a GI eval today    Hypertensive urgency - home meds: Norvasc, HCTZ, Prazosin - currently on Norvasc and Prazosin but cont to hold HCTZ for now - PRN Hydralazine and Lopressor ordered as well  Lactic acidosis - improved with IVF- ? Due to vomiting  UTI  - culture ordered- cont antibiotics- day 3  Mild hypokalemia - replace and follow  Chronic pain/ Fibromyalgia - cont home medications  Anxiety/ depression - cont home meds    DVT prophylaxis: SCDs Code Status: Full code Family Communication:  Disposition Plan: cont clamping trial Consultants:   none Procedures:   NG tube Antimicrobials:  Anti-infectives (From admission, onward)   Start     Dose/Rate Route Frequency Ordered Stop   07/20/18 0730  ciprofloxacin (CIPRO) IVPB 400 mg     400  mg 200 mL/hr over 60 Minutes Intravenous Every 24 hours 07/20/18 0646         Objective: Vitals:   07/20/18 2130 07/20/18 2200 07/21/18 0155 07/21/18 0814  BP:  (!) 167/93 (!) 165/100 (!) 151/116  Pulse:   (!) 121 (!) 128  Resp:    (!) 22  Temp:   98.5 F (36.9 C) 98 F (36.7 C)  TempSrc:   Axillary Oral  SpO2: 95%  92% 96%  Weight:      Height:        Intake/Output Summary (Last 24 hours) at 07/21/2018 1249 Last data filed at 07/21/2018 1231 Gross per 24 hour  Intake 1651.67 ml  Output 1400 ml  Net 251.67 ml   Filed Weights   07/20/18 0109  Weight: 99.6 kg    Examination: General exam: Appears comfortable  HEENT: PERRLA, oral mucosa moist, no sclera icterus or thrush Respiratory system: Clear to auscultation. Respiratory effort normal. Cardiovascular system: S1 & S2 heard, RRR.   Gastrointestinal system: Abdomen soft, mildly tender throughout abdomen, nondistended. Normal bowel sound. No organomegaly Central nervous system: Alert and oriented. No focal neurological deficits. Extremities: No cyanosis, clubbing or edema Skin: No rashes or ulcers Psychiatry:  Mood & affect appropriate.     Data Reviewed: I have personally reviewed following labs and imaging studies  CBC: Recent Labs  Lab 07/19/18 1901 07/20/18 0256 07/21/18 0304  WBC 11.7* 13.9* 9.4  HGB 15.6* 14.4 13.9  HCT 48.5* 44.3 42.2  MCV 86.6 86.2 85.6  PLT 393 391 335   Basic Metabolic Panel: Recent Labs  Lab 07/19/18 1901 07/20/18 0256 07/21/18 0304  NA 141 143 141  K 3.7 3.1* 2.9*  CL 100 101 103  CO2 28 26 26   GLUCOSE 150* 128* 155*  BUN 15 16 8   CREATININE 2.48* 2.32* 1.21*  CALCIUM 9.2 9.1 8.5*  MG  --   --  1.3*   GFR: Estimated Creatinine Clearance: 62.5 mL/min (A) (by C-G formula based on SCr of 1.21 mg/dL (H)). Liver Function Tests: Recent Labs  Lab 07/19/18 1901 07/20/18 0256  AST 48* 55*  ALT 47* 49*  ALKPHOS 95 91  BILITOT 0.7 1.1  PROT 7.2 6.7  ALBUMIN 4.1 3.8     Recent Labs  Lab 07/19/18 1901  LIPASE 37   No results for input(s): AMMONIA in the last 168 hours. Coagulation Profile: No results for input(s): INR, PROTIME in the last 168 hours. Cardiac Enzymes: No results for input(s): CKTOTAL, CKMB, CKMBINDEX, TROPONINI in the last 168 hours. BNP (last 3 results) No results for input(s): PROBNP in the last 8760 hours. HbA1C: No results for input(s): HGBA1C in the last 72 hours. CBG: No results for input(s): GLUCAP in the last 168 hours. Lipid Profile: No results for input(s): CHOL, HDL, LDLCALC, TRIG, CHOLHDL, LDLDIRECT in the last 72 hours. Thyroid Function Tests: No results for input(s): TSH, T4TOTAL, FREET4, T3FREE, THYROIDAB in the last 72 hours. Anemia Panel: No results for input(s): VITAMINB12, FOLATE, FERRITIN, TIBC, IRON, RETICCTPCT in the last 72 hours. Urine analysis:    Component Value Date/Time   COLORURINE YELLOW 07/19/2018 2048   APPEARANCEUR HAZY (A) 07/19/2018 2048   LABSPEC 1.016 07/19/2018 2048   PHURINE 5.0 07/19/2018 2048   GLUCOSEU 50 (A) 07/19/2018 2048   HGBUR SMALL (A) 07/19/2018 2048   BILIRUBINUR NEGATIVE 07/19/2018 2048   KETONESUR NEGATIVE 07/19/2018 2048   PROTEINUR 30 (A) 07/19/2018 2048   NITRITE NEGATIVE 07/19/2018 2048   LEUKOCYTESUR TRACE (A) 07/19/2018 2048   Sepsis Labs: @LABRCNTIP (procalcitonin:4,lacticidven:4) )No results found for this or any previous visit (from the past 240 hour(s)).       Radiology Studies: Dg Abd Acute W/chest  Result Date: 07/19/2018 CLINICAL DATA:  Abdominal pain with nausea and vomiting EXAM: DG ABDOMEN ACUTE W/ 1V CHEST COMPARISON:  Chest radiograph May 17, 2018; CT abdomen and pelvis December 16, 2017 FINDINGS: PA chest: There is atelectatic change in the left base. The lungs elsewhere clear. Heart size and pulmonary vascularity are normal. No adenopathy. There is postoperative change in the lower cervical region. Supine and upright abdomen: There are loops of  mildly dilated small bowel without appreciable air-fluid level. No evident free air. There are surgical clips in the right upper abdomen. There are phleboliths in the pelvis as well as a small calcified uterine leiomyoma. IMPRESSION: Loops of mildly dilated small bowel without air-fluid levels. Question enteritis or early ileus. Bowel obstruction felt to be less likely. No free air. There is left base atelectasis. Lungs elsewhere clear. There is a small calcified uterine leiomyoma. Electronically Signed   By: Bretta Bang III M.D.   On: 07/19/2018 21:46   Dg Abd Portable 1v  Result Date: 07/20/2018 CLINICAL DATA:  Nasogastric tube placement. EXAM: PORTABLE ABDOMEN - 1 VIEW COMPARISON:  Abdominal radiograph performed 07/19/2018 FINDINGS: The patient's enteric tube is seen ending overlying the body of the stomach, with the side port about the fundus of the stomach.  The visualized bowel gas pattern is grossly unremarkable. Clips are noted at the right upper quadrant, reflecting prior cholecystectomy. The visualized portions of the lungs are grossly clear. No acute osseous abnormalities are seen. IMPRESSION: Enteric tube noted ending overlying the body of the stomach. Electronically Signed   By: Roanna RaiderJeffery  Chang M.D.   On: 07/20/2018 02:51   Dg Abd Portable 2v  Result Date: 07/21/2018 CLINICAL DATA:  Evaluate vomiting EXAM: PORTABLE ABDOMEN - 2 VIEW COMPARISON:  July 20, 2018 FINDINGS: The NG tube terminates in the left upper quadrant, likely a stomach. Mild atelectasis in the left base. Lung bases otherwise normal. Cholecystectomy clips. No free air, portal venous gas, or pneumatosis. No evidence of bowel obstruction. Possible small calcified fibroid in the uterus. No other abnormalities. IMPRESSION: The NG tube is in good position. No cause for the patient's symptoms identified. Electronically Signed   By: Gerome Samavid  Williams III M.D   On: 07/21/2018 10:09      Scheduled Meds: . amLODipine  10 mg Oral  Daily  . feeding supplement  1 Container Oral TID BM  . ipratropium-albuterol  3 mL Nebulization BID  . montelukast  10 mg Oral QHS  . morphine  30 mg Oral Q12H  . oxyCODONE-acetaminophen  1 tablet Oral QID   And  . oxyCODONE  5 mg Oral QID  . prazosin  2 mg Oral QHS   Continuous Infusions: . ciprofloxacin 400 mg (07/21/18 1004)  . potassium chloride 10 mEq (07/21/18 1238)     LOS: 1 day    Time spent in minutes: 35    Calvert CantorSaima Nakia Remmers, MD [facility] Pager: [email or web address] Password [email or web address] 07/21/2018, 12:49 PM

## 2018-07-22 ENCOUNTER — Inpatient Hospital Stay (HOSPITAL_COMMUNITY): Payer: Medicare Other

## 2018-07-22 LAB — BASIC METABOLIC PANEL
Anion gap: 13 (ref 5–15)
BUN: 6 mg/dL (ref 6–20)
CHLORIDE: 97 mmol/L — AB (ref 98–111)
CO2: 29 mmol/L (ref 22–32)
Calcium: 9.1 mg/dL (ref 8.9–10.3)
Creatinine, Ser: 1.1 mg/dL — ABNORMAL HIGH (ref 0.44–1.00)
GFR calc Af Amer: >60 mL/min (ref >60)
GFR calc non Af Amer: 55 mL/min — ABNORMAL LOW (ref >60)
Glucose, Bld: 128 mg/dL — ABNORMAL HIGH (ref 70–99)
POTASSIUM: 3 mmol/L — AB (ref 3.5–5.1)
SODIUM: 139 mmol/L (ref 135–145)

## 2018-07-22 LAB — CBC
HEMATOCRIT: 47.6 % — AB (ref 36.0–46.0)
HEMOGLOBIN: 15.8 g/dL — AB (ref 12.0–15.0)
MCH: 28.1 pg (ref 26.0–34.0)
MCHC: 33.2 g/dL (ref 30.0–36.0)
MCV: 84.7 fL (ref 78.0–100.0)
Platelets: 412 10*3/uL — ABNORMAL HIGH (ref 150–400)
RBC: 5.62 MIL/uL — AB (ref 3.87–5.11)
RDW: 13.1 % (ref 11.5–15.5)
WBC: 10.4 10*3/uL (ref 4.0–10.5)

## 2018-07-22 LAB — POTASSIUM: Potassium: 3.6 mmol/L (ref 3.5–5.1)

## 2018-07-22 LAB — MAGNESIUM: Magnesium: 1.7 mg/dL (ref 1.7–2.4)

## 2018-07-22 MED ORDER — MORPHINE SULFATE (PF) 2 MG/ML IV SOLN
2.0000 mg | INTRAVENOUS | Status: DC | PRN
  Administered 2018-07-22 (×2): 2 mg via INTRAVENOUS
  Filled 2018-07-22 (×2): qty 1

## 2018-07-22 MED ORDER — POTASSIUM CHLORIDE 10 MEQ/100ML IV SOLN
10.0000 meq | INTRAVENOUS | Status: AC
  Administered 2018-07-22 (×4): 10 meq via INTRAVENOUS
  Filled 2018-07-22 (×4): qty 100

## 2018-07-22 MED ORDER — POTASSIUM CHLORIDE 10 MEQ/100ML IV SOLN: 10.0000 meq | INTRAVENOUS | Status: DC

## 2018-07-22 MED ORDER — ONDANSETRON HCL 4 MG/2ML IJ SOLN
4.0000 mg | Freq: Four times a day (QID) | INTRAMUSCULAR | Status: DC | PRN
  Administered 2018-07-22 – 2018-07-23 (×4): 4 mg via INTRAVENOUS
  Filled 2018-07-22 (×4): qty 2

## 2018-07-22 MED ORDER — DIPHENHYDRAMINE HCL 50 MG/ML IJ SOLN
25.0000 mg | Freq: Once | INTRAMUSCULAR | Status: AC
  Administered 2018-07-22: 25 mg via INTRAVENOUS
  Filled 2018-07-22: qty 1

## 2018-07-22 MED ORDER — POTASSIUM CHLORIDE IN NACL 40-0.9 MEQ/L-% IV SOLN
INTRAVENOUS | Status: DC
  Administered 2018-07-22 – 2018-07-24 (×5): 125 mL/h via INTRAVENOUS
  Filled 2018-07-22 (×8): qty 1000

## 2018-07-22 MED ORDER — LORAZEPAM 2 MG/ML IJ SOLN
1.0000 mg | Freq: Once | INTRAMUSCULAR | Status: AC
  Administered 2018-07-22: 1 mg via INTRAVENOUS
  Filled 2018-07-22: qty 1

## 2018-07-22 MED ORDER — MORPHINE SULFATE (PF) 2 MG/ML IV SOLN
2.0000 mg | INTRAVENOUS | Status: AC
  Administered 2018-07-22: 2 mg via INTRAVENOUS
  Filled 2018-07-22: qty 1

## 2018-07-22 MED ORDER — LORAZEPAM 2 MG/ML IJ SOLN
0.5000 mg | Freq: Four times a day (QID) | INTRAMUSCULAR | Status: DC | PRN
  Administered 2018-07-22 – 2018-07-24 (×5): 0.5 mg via INTRAVENOUS
  Filled 2018-07-22 (×5): qty 1

## 2018-07-22 MED ORDER — DIATRIZOATE MEGLUMINE & SODIUM 66-10 % PO SOLN
90.0000 mL | Freq: Once | ORAL | Status: AC
  Administered 2018-07-22: 90 mL via NASOGASTRIC
  Filled 2018-07-22: qty 90

## 2018-07-22 NOTE — Progress Notes (Addendum)
bp noted , pt states she is in pain, will medicate

## 2018-07-22 NOTE — Progress Notes (Signed)
PROGRESS NOTE    Deanna Yang   [id number]:[id number]  DOB: 09/15/1961  DOA: 07/19/2018 PCP: Eartha InchBadger, Michael C, MD   Brief Narrative:  Deanna Mansolores Yang is a 56y/o female with PMH of chronic pain on narcotics, anxiety/depression, HTN, fibromyalgia, COPD on 3.5 L O2 at home who presents with vomiting x 1 wk. CT abd/pelvis 8/8> IMPRESSION:  Probable partial mid small bowel obstruction, incompletely imaged. Abd Xray in ED: Loops of mildly dilated small bowel without air-fluid levels.  Subjective: Having retching this AM and feels pain throughout the abdomen along with anxiety.     Assessment & Plan:   Principal Problem:   ARF (acute renal failure) / dehydration - baseline Cr about 1- 1.4- Cr about 2.4 on admission- improved to 1.21 and then 1.10 - will cont slow IVF and follow  Active Problems:  Nausea & vomiting-  Paralytic ileus  - Xray abd early AM shows a non-specific bowel gas pattern with no air fluid levels -she had good bowel sounds today with minimal abdominal distension- she is on narcotics chronically but is not constipated - GI is following and plans for CT enterography - she continues to be symptomatic with pain, nausea, retching despite NG tube - will continue to treat symptoms- cont NG on low intermittent suction - NPO except medications    Hypertensive urgency - home meds: Norvasc, HCTZ, Prazosin - currently on Norvasc and Prazosin - cont to hold HCTZ for now to prevent hyponatremia in relation to vomiting/dehydration - suspect GI discomfort is causing BP to rise - PRN Hydralazine and Lopressor ordered as well  Lactic acidosis - improved with IVF- ? Due to vomiting  UTI  - culture shows < 10K colonies d/c antibiotics  Mild hypokalemia - replace and follow  Chronic pain/ Fibromyalgia - cont home medications - IV Morphine ordered in case she is unable to tolerate oral medications  Anxiety/ depression - cont home meds- PRN Ativan in case she does not tolerate  oral Buspar    DVT prophylaxis: SCDs Code Status: Full code Family Communication:  Disposition Plan: cont clamping trial Consultants:   none Procedures:   NG tube Antimicrobials:  Anti-infectives (From admission, onward)   Start     Dose/Rate Route Frequency Ordered Stop   07/21/18 2200  ciprofloxacin (CIPRO) IVPB 400 mg     400 mg 200 mL/hr over 60 Minutes Intravenous Every 12 hours 07/21/18 1527     07/21/18 1300  cefTRIAXone (ROCEPHIN) 1 g in sodium chloride 0.9 % 100 mL IVPB  Status:  Discontinued     1 g 200 mL/hr over 30 Minutes Intravenous Every 24 hours 07/21/18 1259 07/21/18 1526   07/20/18 0730  ciprofloxacin (CIPRO) IVPB 400 mg  Status:  Discontinued     400 mg 200 mL/hr over 60 Minutes Intravenous Every 24 hours 07/20/18 0646 07/21/18 1259       Objective: Vitals:   07/21/18 1935 07/22/18 0052 07/22/18 0500 07/22/18 0742  BP:  (!) 157/112  (!) 172/154  Pulse:  (!) 115  (!) 126  Resp:    20  Temp:  98.8 F (37.1 C)  97.8 F (36.6 C)  TempSrc:  Oral  Axillary  SpO2: 93% 95%    Weight:   97.7 kg   Height:        Intake/Output Summary (Last 24 hours) at 07/22/2018 1135 Last data filed at 07/22/2018 1021 Gross per 24 hour  Intake 1020 ml  Output 1450 ml  Net -430 ml   Filed  Weights   07/20/18 0109 07/22/18 0500  Weight: 99.6 kg 97.7 kg    Examination: General exam: Appears uncomfortable, in pain HEENT: PERRLA, oral mucosa moist, no sclera icterus or thrush Respiratory system: Clear to auscultation. Respiratory effort normal. Cardiovascular system: S1 & S2 heard, RRR.  tachycardia Gastrointestinal system: Abdomen soft, mildly tender throughout abdomen, nondistended. Bowel sounds diminished today. Central nervous system: Alert and oriented. No focal neurological deficits. Extremities: No cyanosis, clubbing or edema Skin: No rashes or ulcers Psychiatry:  anxious    Data Reviewed: I have personally reviewed following labs and imaging  studies  CBC: Recent Labs  Lab 07/19/18 1901 07/20/18 0256 07/21/18 0304 07/22/18 0215  WBC 11.7* 13.9* 9.4 10.4  HGB 15.6* 14.4 13.9 15.8*  HCT 48.5* 44.3 42.2 47.6*  MCV 86.6 86.2 85.6 84.7  PLT 393 391 335 412*   Basic Metabolic Panel: Recent Labs  Lab 07/19/18 1901 07/20/18 0256 07/21/18 0304 07/22/18 0215  NA 141 143 141 139  K 3.7 3.1* 2.9* 3.0*  CL 100 101 103 97*  CO2 28 26 26 29   GLUCOSE 150* 128* 155* 128*  BUN 15 16 8 6   CREATININE 2.48* 2.32* 1.21* 1.10*  CALCIUM 9.2 9.1 8.5* 9.1  MG  --   --  1.3*  --    GFR: Estimated Creatinine Clearance: 68.1 mL/min (A) (by C-G formula based on SCr of 1.1 mg/dL (H)). Liver Function Tests: Recent Labs  Lab 07/19/18 1901 07/20/18 0256  AST 48* 55*  ALT 47* 49*  ALKPHOS 95 91  BILITOT 0.7 1.1  PROT 7.2 6.7  ALBUMIN 4.1 3.8   Recent Labs  Lab 07/19/18 1901  LIPASE 37   No results for input(s): AMMONIA in the last 168 hours. Coagulation Profile: No results for input(s): INR, PROTIME in the last 168 hours. Cardiac Enzymes: No results for input(s): CKTOTAL, CKMB, CKMBINDEX, TROPONINI in the last 168 hours. BNP (last 3 results) No results for input(s): PROBNP in the last 8760 hours. HbA1C: No results for input(s): HGBA1C in the last 72 hours. CBG: No results for input(s): GLUCAP in the last 168 hours. Lipid Profile: No results for input(s): CHOL, HDL, LDLCALC, TRIG, CHOLHDL, LDLDIRECT in the last 72 hours. Thyroid Function Tests: No results for input(s): TSH, T4TOTAL, FREET4, T3FREE, THYROIDAB in the last 72 hours. Anemia Panel: No results for input(s): VITAMINB12, FOLATE, FERRITIN, TIBC, IRON, RETICCTPCT in the last 72 hours. Urine analysis:    Component Value Date/Time   COLORURINE YELLOW 07/19/2018 2048   APPEARANCEUR HAZY (A) 07/19/2018 2048   LABSPEC 1.016 07/19/2018 2048   PHURINE 5.0 07/19/2018 2048   GLUCOSEU 50 (A) 07/19/2018 2048   HGBUR SMALL (A) 07/19/2018 2048   BILIRUBINUR NEGATIVE  07/19/2018 2048   KETONESUR NEGATIVE 07/19/2018 2048   PROTEINUR 30 (A) 07/19/2018 2048   NITRITE NEGATIVE 07/19/2018 2048   LEUKOCYTESUR TRACE (A) 07/19/2018 2048   Sepsis Labs: @LABRCNTIP (procalcitonin:4,lacticidven:4) ) Recent Results (from the past 240 hour(s))  Culture, Urine     Status: Abnormal   Collection Time: 07/19/18  8:48 PM  Result Value Ref Range Status   Specimen Description URINE, RANDOM  Final   Special Requests NONE  Final   Culture (A)  Final    <10,000 COLONIES/mL INSIGNIFICANT GROWTH Performed at [facility], [address], [place], [place] [address]    Report Status 07/21/2018 FINAL  Final         Radiology Studies: Dg Abd Portable 2v  Result Date: 07/21/2018 CLINICAL  DATA:  Evaluate vomiting EXAM: PORTABLE ABDOMEN - 2 VIEW COMPARISON:  July 20, 2018 FINDINGS: The NG tube terminates in the left upper quadrant, likely a stomach. Mild atelectasis in the left base. Lung bases otherwise normal. Cholecystectomy clips. No free air, portal venous gas, or pneumatosis. No evidence of bowel obstruction. Possible small calcified fibroid in the uterus. No other abnormalities. IMPRESSION: The NG tube is in good position. No cause for the patient's symptoms identified. Electronically Signed   By: Gerome Sam III M.D   On: 07/21/2018 10:09      Scheduled Meds: . amLODipine  10 mg Oral Daily  . feeding supplement  1 Container Oral TID BM  . ipratropium-albuterol  3 mL Nebulization BID  . montelukast  10 mg Oral QHS  . morphine  30 mg Oral Q12H  . oxyCODONE-acetaminophen  1 tablet Oral QID   And  . oxyCODONE  5 mg Oral QID  . potassium chloride  40 mEq Oral Once  . prazosin  2 mg Oral QHS   Continuous Infusions: . ciprofloxacin 400 mg (07/22/18 0820)  . potassium chloride 10 mEq (07/22/18 1021)     LOS: 2 days    Time spent in minutes: 35    Calvert Cantor, MD [facility] Pager: [email or web address] Password [facility] 07/22/2018, 11:35 AM

## 2018-07-22 NOTE — Progress Notes (Addendum)
   4:00 pm -Another call from Radiology- Order for the small bowel study has to be entered using order set for small bowel obstruction protocol. This was ordered but Radiology has warning appear that patient may have gastrograffin allergy?  I attempted to contact patient in room to verify but no answer. I contacted RN Seward GraterMaggie who went into room to talk with patient. She has vomited after "white contrast" before but no sob, hives, wheezing etc with oral contrast so will proceed with study.    12:00 pm Stopped by patient's room to give her update. We ordered CT enterography but CT called me and patient really needs IV contrast for that study. Given renal function and contrast allergy IV wasn't ordered. Therefor we will arrange for small bowel follow through with contrast through the NGT

## 2018-07-23 ENCOUNTER — Inpatient Hospital Stay (HOSPITAL_COMMUNITY): Payer: Medicare Other

## 2018-07-23 ENCOUNTER — Encounter (HOSPITAL_COMMUNITY): Payer: Self-pay | Admitting: Internal Medicine

## 2018-07-23 DIAGNOSIS — R1033 Periumbilical pain: Secondary | ICD-10-CM

## 2018-07-23 DIAGNOSIS — K760 Fatty (change of) liver, not elsewhere classified: Secondary | ICD-10-CM

## 2018-07-23 HISTORY — DX: Fatty (change of) liver, not elsewhere classified: K76.0

## 2018-07-23 LAB — BASIC METABOLIC PANEL
ANION GAP: 13 (ref 5–15)
BUN: 9 mg/dL (ref 6–20)
CALCIUM: 8.5 mg/dL — AB (ref 8.9–10.3)
CO2: 25 mmol/L (ref 22–32)
Chloride: 103 mmol/L (ref 98–111)
Creatinine, Ser: 1.14 mg/dL — ABNORMAL HIGH (ref 0.44–1.00)
GFR, EST NON AFRICAN AMERICAN: 53 mL/min — AB (ref >60)
Glucose, Bld: 123 mg/dL — ABNORMAL HIGH (ref 70–99)
POTASSIUM: 3.6 mmol/L (ref 3.5–5.1)
SODIUM: 141 mmol/L (ref 135–145)

## 2018-07-23 LAB — MAGNESIUM: MAGNESIUM: 1.6 mg/dL — AB (ref 1.7–2.4)

## 2018-07-23 MED ORDER — MAGNESIUM SULFATE 4 GM/100ML IV SOLN
4.0000 g | Freq: Once | INTRAVENOUS | Status: AC
  Administered 2018-07-23: 4 g via INTRAVENOUS
  Filled 2018-07-23: qty 100

## 2018-07-23 MED ORDER — ONDANSETRON HCL 4 MG/2ML IJ SOLN
4.0000 mg | Freq: Four times a day (QID) | INTRAMUSCULAR | Status: DC
  Administered 2018-07-23 – 2018-07-24 (×3): 4 mg via INTRAVENOUS
  Filled 2018-07-23 (×3): qty 2

## 2018-07-23 MED ORDER — MORPHINE SULFATE (PF) 4 MG/ML IV SOLN
4.0000 mg | INTRAVENOUS | Status: DC | PRN
  Administered 2018-07-23: 4 mg via INTRAVENOUS
  Filled 2018-07-23: qty 1

## 2018-07-23 NOTE — Progress Notes (Addendum)
Daily Rounding Note  07/23/2018, 12:23 PM  LOS: 3 days   SUBJECTIVE:   Chief complaint:  Continues c/o abd pain and nausea.  NGT was at [employer] as of yesterdays KUB: adjusted,advanced and is in good position in stomach as of KUB this AM. NGT output was zero in last 2 days.      OBJECTIVE:         Vital signs in last 24 hours:    Temp:  [97.8 F (36.6 C)-98.3 F (36.8 C)] 97.8 F (36.6 C) (08/12 1030) Pulse Rate:  [115-116] 116 (08/12 1030) Resp:  [20-22] 20 (08/12 1030) BP: (154-163)/(100-102) 163/102 (08/12 1030) SpO2:  [92 %-98 %] 98 % (08/12 1030) Last BM Date: 07/19/18 Filed Weights   07/20/18 0109 07/22/18 0500  Weight: 99.6 kg 97.7 kg   General: looks chronically unwell.     Heart: RRR Chest: clear bil   No dyspnea Abdomen: soft, tender upper abd, not focal.  Active BS.  No tinkling or tympanitic BS  Extremities: no CCE Neuro/Psych:  Depressed, anxious.  Oriented x 3.  Slightly drowsy.    Intake/Output from previous day: 08/11 0701 - 08/12 0700 In: 2713.3 [P.O.:120; I.V.:1993.3; IV Piggyback:600] Out: 1150 [Emesis/NG output:1150]  Intake/Output this shift: No intake/output data recorded.  Lab Results: Recent Labs    07/21/18 0304 07/22/18 0215  WBC 9.4 10.4  HGB 13.9 15.8*  HCT 42.2 47.6*  PLT 335 412*   BMET Recent Labs    07/21/18 0304 07/22/18 0215 07/22/18 1857 07/23/18 0237  NA 141 139  --  141  K 2.9* 3.0* 3.6 3.6  CL 103 97*  --  103  CO2 26 29  --  25  GLUCOSE 155* 128*  --  123*  BUN 8 6  --  9  CREATININE 1.21* 1.10*  --  1.14*  CALCIUM 8.5* 9.1  --  8.5*   LFT No results for input(s): PROT, ALBUMIN, AST, ALT, ALKPHOS, BILITOT, BILIDIR, IBILI in the last 72 hours. PT/INR No results for input(s): LABPROT, INR in the last 72 hours. Hepatitis Panel No results for input(s): HEPBSAG, HCVAB, HEPAIGM, HEPBIGM in the last 72 hours.  Studies/Results: Ct Abdomen Pelvis Wo  Contrast  Result Date: 07/23/2018 CLINICAL DATA:  Nausea and vomiting with paralytic ileus. Minimal abdominal distention. EXAM: CT ABDOMEN AND PELVIS WITHOUT CONTRAST TECHNIQUE: Multidetector CT imaging of the abdomen and pelvis was performed following the standard protocol without IV contrast. COMPARISON:  12/16/2017 FINDINGS: Lower chest: Atelectasis in the lung bases. Enteric tube is present in the esophagus. Hepatobiliary: Diffuse fatty infiltration of the liver. No focal lesions are identified. Surgical absence of the gallbladder. No bile duct dilatation. Pancreas: Unremarkable. No pancreatic ductal dilatation or surrounding inflammatory changes. Spleen: Normal in size without focal abnormality. Adrenals/Urinary Tract: Adrenal glands are unremarkable. Kidneys are normal, without renal calculi, focal lesion, or hydronephrosis. Bladder is unremarkable. Stomach/Bowel: Enteric tube terminates in the body of the stomach. Stomach, small bowel, and colon are mostly decompressed. Contrast material flows through to the rectum suggesting no evidence of obstruction. No wall thickening is appreciated although under distention limits evaluation of bowel wall. Appendix is not identified. Vascular/Lymphatic: Aortic atherosclerosis. No enlarged abdominal or pelvic lymph nodes. Reproductive: Calcified fibroid in the uterus. Ovaries are not enlarged. Other: No abdominal wall hernia or abnormality. No abdominopelvic ascites. Musculoskeletal: No acute or significant osseous findings. IMPRESSION: No acute process demonstrated in the abdomen or pelvis. No evidence  of bowel dilatation, obstruction or inflammation. Diffuse fatty infiltration of the liver. Aortic atherosclerosis. Calcified uterine fibroid. Electronically Signed   By: Burman NievesWilliam  Stevens M.D.   On: 07/23/2018 01:47   Dg Abd 1 View  Result Date: 07/23/2018 CLINICAL DATA:  Status post repositioning of NG tube this morning. EXAM: ABDOMEN - 1 VIEW COMPARISON:  None.  FINDINGS: NG tube is in good position with the side-port in the stomach. Subsegmental atelectasis left lung base noted. IMPRESSION: NG tube in good position. Electronically Signed   By: Drusilla Kannerhomas  Dalessio M.D.   On: 07/23/2018 08:47   Dg Abd Portable 1v-small Bowel Obstruction Protocol-initial, 8 Hr Delay  Result Date: 07/23/2018 CLINICAL DATA:  Small bowel obstruction protocol. 8 hour delay. EXAM: PORTABLE ABDOMEN - 1 VIEW COMPARISON:  CT abdomen and pelvis 07/23/2018. FINDINGS: Contrast material is demonstrated throughout the colon suggesting no evidence of high-grade bowel obstruction. There a few mildly prominent gas-filled small bowel loops which could indicate ileus. An enteric tube is present with tip in the left upper quadrant consistent with location in the upper stomach. Surgical clips in the right upper quadrant. Calcification in the pelvis likely represents a uterine fibroid. IMPRESSION: Contrast material is demonstrated throughout the colon suggesting no evidence of high-grade bowel obstruction. Electronically Signed   By: Burman NievesWilliam  Stevens M.D.   On: 07/23/2018 03:48   Dg Abd Portable 1v-small Bowel Protocol-position Verification  Result Date: 07/22/2018 CLINICAL DATA:  NG tube placement. EXAM: PORTABLE ABDOMEN - 1 VIEW COMPARISON:  07/21/2018 FINDINGS: NG tube passes below the diaphragm into the stomach. Side hole of the tube lies at the level of the gastroesophageal junction. IMPRESSION: NG tube tip within the stomach. Electronically Signed   By: Amie Portlandavid  Ormond M.D.   On: 07/22/2018 17:53    ASSESMENT:   *   Nausea, vomiting, loose stools, weight loss, "stomach problems", s/p multiple "30" abd/pelvic surgeries.  Previous dx of IBS, chronic abd pain, chronic narcotics ( long and short acting).   Ileus vs enteritis on initial films.   Ct abd/pelvis # 1 on 07/19/18 with ? cbstruction at mid small bowel Ct abd/pelvis # 2 today.  negative for obstructive, inflammatory process.  Fatty liver and  aortic vascular disease noted.  NGT still in place, ? Necessity for this.  ? Narcotic s/e?  Gastorparesis?   No surgical plans per Dr Derrell Lollingamirez.   [place] [facility], [facility]D [facility].    *    AKI.  Resolved hypokalemia.    *   Obesity, OSA      PLAN   *   Asked RN to take NGT off suction, see if it makes any difference in her complaints.     Jennye MoccasinSarah Gribbin  07/23/2018, 12:23 PM Phone [phone]    Attending physician's note   I have taken an interval history, reviewed the chart and examined the patient. I agree with the Advanced Practitioner's note, impression and recommendations. CT from 8/8 read as probably mid SBO. Today's CT report and images reviewed - no obstruction or other acute process is noted. Nausea persists. Pt tolerated NG without suction and she requests to have NGT removed. DC NGT. Change Zofran to q6h RTC. Clear liquids and advance as tolerated. GI follow [facility] as [facility].   Claudette HeadMalcolm Haniyah Maciolek, MD FACG [phone] office

## 2018-07-23 NOTE — Care Management Important Message (Signed)
Important Message  Patient Details  Name: Deanna Yang MRN: [id number] Date of Birth: [birth year]   Medicare Important Message Given:  Yes    [name], [name], RN 07/23/2018, 3:29 PM

## 2018-07-23 NOTE — Progress Notes (Signed)
PROGRESS NOTE    Deanna Yang   ZOX:[id number]  DOB: [birth year]  DOA: 07/19/2018 PCP: Eartha Inch, MD   Brief Narrative:  Deanna Yang is a 56y/o female with PMH of chronic pain on narcotics, anxiety/depression, HTN, fibromyalgia, COPD on 3.5 L O2 at home who presents with vomiting x 1 wk. CT abd/pelvis 8/8> IMPRESSION:  Probable partial mid small bowel obstruction, incompletely imaged. Abd Xray in ED: Loops of mildly dilated small bowel without air-fluid levels.  Subjective: She continues to have nausea and retching despite the NG tube. She has mild diffuse abdominal pain, controlled IV with pain medications.     Assessment & Plan:   Principal Problem:   ARF (acute renal failure) / dehydration - baseline Cr about 1- 1.4- Cr about 2.4 on admission- improved to 1.21 and then 1.10 - will cont slow IVF, follow I and O    Active Problems:  Nausea & vomiting-  Paralytic ileus  - Xray abd shows a non-specific bowel gas pattern with no air fluid levels- not very suggestive of ileus  - she is on narcotics chronically but is not constipated - she continues to be symptomatic with pain, nausea, retching despite NG tube - will continue to treat symptoms - small bowel protocol does not reveal a SBO- cont NG on low intermittent suction - Gi following     Hypertensive urgency - home meds: Norvasc, HCTZ, Prazosin - currently on Norvasc and Prazosin - initially were able to clamp NG long enough to ensure her meds were being digested but now she is declining oral medications and are using IV - cont to hold HCTZ for now to prevent hyponatremia in relation to vomiting/dehydration - suspect GI discomfort is causing BP to rise - PRN Hydralazine and Lopressor ordered    Fatty liver - noted on imaging  Lactic acidosis - improved with IVF- ? Due to vomiting  UTI  - culture shows < 10K colonies d/c antibiotics  Mild hypokalemia - replacing via IV route  Chronic pain/  Fibromyalgia - cont home medications as tolerated (clamping NG) - IV Morphine ordered in case she is unable to tolerate oral medications  Anxiety/ depression - cont home meds- PRN Ativan if she does not tolerate oral Buspar    DVT prophylaxis: SCDs Code Status: Full code Family Communication: mother at bedside Disposition Plan: management per GI- cont NG for now Consultants:   GI Procedures:   NG tube Antimicrobials:  Anti-infectives (From admission, onward)   Start     Dose/Rate Route Frequency Ordered Stop   07/21/18 2200  ciprofloxacin (CIPRO) IVPB 400 mg  Status:  Discontinued     400 mg 200 mL/hr over 60 Minutes Intravenous Every 12 hours 07/21/18 1527 07/22/18 1146   07/21/18 1300  cefTRIAXone (ROCEPHIN) 1 g in sodium chloride 0.9 % 100 mL IVPB  Status:  Discontinued     1 g 200 mL/hr over 30 Minutes Intravenous Every 24 hours 07/21/18 1259 07/21/18 1526   07/20/18 0730  ciprofloxacin (CIPRO) IVPB 400 mg  Status:  Discontinued     400 mg 200 mL/hr over 60 Minutes Intravenous Every 24 hours 07/20/18 0646 07/21/18 1259       Objective: Vitals:   07/22/18 0742 07/22/18 1538 07/22/18 1957 07/23/18 1030  BP: (!) 172/154 (!) 154/100  (!) 163/102  Pulse: (!) 126 (!) 115  (!) 116  Resp: 20 (!) 22  20  Temp: 97.8 F (36.6 C) 98.3 F (36.8 C)  97.8 F (36.6  C)  TempSrc: Axillary Oral  Oral  SpO2:  94% 92% 98%  Weight:      Height:        Intake/Output Summary (Last 24 hours) at 07/23/2018 1135 Last data filed at 07/23/2018 0500 Gross per 24 hour  Intake 2313.33 ml  Output 800 ml  Net 1513.33 ml   Filed Weights   07/20/18 0109 07/22/18 0500  Weight: 99.6 kg 97.7 kg    Examination: General exam: Appears uncomfortable  HEENT: PERRLA, oral mucosa moist, no sclera icterus or thrush Respiratory system: Clear to auscultation. Respiratory effort normal. Cardiovascular system: S1 & S2 heard, RRR.  tachycardia Gastrointestinal system: Abdomen soft, mildly tender  throughout abdomen, nondistended. Poor bowel sounds Central nervous system: Alert and oriented. No focal neurological deficits. Extremities: No cyanosis, clubbing or edema Skin: No rashes or ulcers Psychiatry:  anxious    Data Reviewed: I have personally reviewed following labs and imaging studies  CBC: Recent Labs  Lab 07/19/18 1901 07/20/18 0256 07/21/18 0304 07/22/18 0215  WBC 11.7* 13.9* 9.4 10.4  HGB 15.6* 14.4 13.9 15.8*  HCT 48.5* 44.3 42.2 47.6*  MCV 86.6 86.2 85.6 84.7  PLT 393 391 335 412*   Basic Metabolic Panel: Recent Labs  Lab 07/19/18 1901 07/20/18 0256 07/21/18 0304 07/22/18 0215 07/22/18 1153 07/22/18 1857 07/23/18 0237  NA 141 143 141 139  --   --  141  K 3.7 3.1* 2.9* 3.0*  --  3.6 3.6  CL 100 101 103 97*  --   --  103  CO2 28 26 26 29   --   --  25  GLUCOSE 150* 128* 155* 128*  --   --  123*  BUN 15 16 8 6   --   --  9  CREATININE 2.48* 2.32* 1.21* 1.10*  --   --  1.14*  CALCIUM 9.2 9.1 8.5* 9.1  --   --  8.5*  MG  --   --  1.3*  --  1.7  --  1.6*   GFR: Estimated Creatinine Clearance: 65.8 mL/min (A) (by C-G formula based on SCr of 1.14 mg/dL (H)). Liver Function Tests: Recent Labs  Lab 07/19/18 1901 07/20/18 0256  AST 48* 55*  ALT 47* 49*  ALKPHOS 95 91  BILITOT 0.7 1.1  PROT 7.2 6.7  ALBUMIN 4.1 3.8   Recent Labs  Lab 07/19/18 1901  LIPASE 37   No results for input(s): AMMONIA in the last 168 hours. Coagulation Profile: No results for input(s): INR, PROTIME in the last 168 hours. Cardiac Enzymes: No results for input(s): CKTOTAL, CKMB, CKMBINDEX, TROPONINI in the last 168 hours. BNP (last 3 results) No results for input(s): PROBNP in the last 8760 hours. HbA1C: No results for input(s): HGBA1C in the last 72 hours. CBG: No results for input(s): GLUCAP in the last 168 hours. Lipid Profile: No results for input(s): CHOL, HDL, LDLCALC, TRIG, CHOLHDL, LDLDIRECT in the last 72 hours. Thyroid Function Tests: No results for  input(s): TSH, T4TOTAL, FREET4, T3FREE, THYROIDAB in the last 72 hours. Anemia Panel: No results for input(s): VITAMINB12, FOLATE, FERRITIN, TIBC, IRON, RETICCTPCT in the last 72 hours. Urine analysis:    Component Value Date/Time   COLORURINE YELLOW 07/19/2018 2048   APPEARANCEUR HAZY (A) 07/19/2018 2048   LABSPEC 1.016 07/19/2018 2048   PHURINE 5.0 07/19/2018 2048   GLUCOSEU 50 (A) 07/19/2018 2048   HGBUR SMALL (A) 07/19/2018 2048   BILIRUBINUR NEGATIVE 07/19/2018 2048   KETONESUR NEGATIVE 07/19/2018 2048  PROTEINUR 30 (A) 07/19/2018 2048   NITRITE NEGATIVE 07/19/2018 2048   LEUKOCYTESUR TRACE (A) 07/19/2018 2048   Sepsis Labs: @LABRCNTIP (procalcitonin:4,lacticidven:4) ) Recent Results (from the past 240 hour(s))  Culture, Urine     Status: Abnormal   Collection Time: 07/19/18  8:48 PM  Result Value Ref Range Status   Specimen Description URINE, RANDOM  Final   Special Requests NONE  Final   Culture (A)  Final    <10,000 COLONIES/mL INSIGNIFICANT GROWTH Performed at [facility], [address] [place], [place] [address]    Report Status 07/21/2018 FINAL  Final         Radiology Studies: Ct Abdomen Pelvis Wo Contrast  Result Date: 07/23/2018 CLINICAL DATA:  Nausea and vomiting with paralytic ileus. Minimal abdominal distention. EXAM: CT ABDOMEN AND PELVIS WITHOUT CONTRAST TECHNIQUE: Multidetector CT imaging of the abdomen and pelvis was performed following the standard protocol without IV contrast. COMPARISON:  12/16/2017 FINDINGS: Lower chest: Atelectasis in the lung bases. Enteric tube is present in the esophagus. Hepatobiliary: Diffuse fatty infiltration of the liver. No focal lesions are identified. Surgical absence of the gallbladder. No bile duct dilatation. Pancreas: Unremarkable. No pancreatic ductal dilatation or surrounding inflammatory changes. Spleen: Normal in size without focal abnormality. Adrenals/Urinary Tract: Adrenal glands are unremarkable.  Kidneys are normal, without renal calculi, focal lesion, or hydronephrosis. Bladder is unremarkable. Stomach/Bowel: Enteric tube terminates in the body of the stomach. Stomach, small bowel, and colon are mostly decompressed. Contrast material flows through to the rectum suggesting no evidence of obstruction. No wall thickening is appreciated although under distention limits evaluation of bowel wall. Appendix is not identified. Vascular/Lymphatic: Aortic atherosclerosis. No enlarged abdominal or pelvic lymph nodes. Reproductive: Calcified fibroid in the uterus. Ovaries are not enlarged. Other: No abdominal wall hernia or abnormality. No abdominopelvic ascites. Musculoskeletal: No acute or significant osseous findings. IMPRESSION: No acute process demonstrated in the abdomen or pelvis. No evidence of bowel dilatation, obstruction or inflammation. Diffuse fatty infiltration of the liver. Aortic atherosclerosis. Calcified uterine fibroid. Electronically Signed   By: Burman NievesWilliam  Stevens M.D.   On: 07/23/2018 01:47   Dg Abd 1 View  Result Date: 07/23/2018 CLINICAL DATA:  Status post repositioning of NG tube this morning. EXAM: ABDOMEN - 1 VIEW COMPARISON:  None. FINDINGS: NG tube is in good position with the side-port in the stomach. Subsegmental atelectasis left lung base noted. IMPRESSION: NG tube in good position. Electronically Signed   By: Drusilla Kannerhomas  Dalessio M.D.   On: 07/23/2018 08:47   Dg Abd Portable 1v-small Bowel Obstruction Protocol-initial, 8 Hr Delay  Result Date: 07/23/2018 CLINICAL DATA:  Small bowel obstruction protocol. 8 hour delay. EXAM: PORTABLE ABDOMEN - 1 VIEW COMPARISON:  CT abdomen and pelvis 07/23/2018. FINDINGS: Contrast material is demonstrated throughout the colon suggesting no evidence of high-grade bowel obstruction. There a few mildly prominent gas-filled small bowel loops which could indicate ileus. An enteric tube is present with tip in the left upper quadrant consistent with location  in the upper stomach. Surgical clips in the right upper quadrant. Calcification in the pelvis likely represents a uterine fibroid. IMPRESSION: Contrast material is demonstrated throughout the colon suggesting no evidence of high-grade bowel obstruction. Electronically Signed   By: Burman NievesWilliam  Stevens M.D.   On: 07/23/2018 03:48   Dg Abd Portable 1v-small Bowel Protocol-position Verification  Result Date: 07/22/2018 CLINICAL DATA:  NG tube placement. EXAM: PORTABLE ABDOMEN - 1 VIEW COMPARISON:  07/21/2018 FINDINGS: NG tube passes below the diaphragm into the  stomach. Side hole of the tube lies at the level of the gastroesophageal junction. IMPRESSION: NG tube tip within the stomach. Electronically Signed   By: Amie Portland M.D.   On: 07/22/2018 17:53      Scheduled Meds: . amLODipine  10 mg Oral Daily  . feeding supplement  1 Container Oral TID BM  . ipratropium-albuterol  3 mL Nebulization BID  . montelukast  10 mg Oral QHS  . morphine  30 mg Oral Q12H  . oxyCODONE-acetaminophen  1 tablet Oral QID   And  . oxyCODONE  5 mg Oral QID  . potassium chloride  40 mEq Oral Once  . prazosin  2 mg Oral QHS   Continuous Infusions: . 0.9 % NaCl with KCl 40 mEq / L 125 mL/hr (07/23/18 0859)     LOS: 3 days    Time spent in minutes: 35    Calvert Cantor, MD [facility] Pager: [email or web address] Password [facility] 07/23/2018, 11:35 AM

## 2018-07-24 DIAGNOSIS — N179 Acute kidney failure, unspecified: Secondary | ICD-10-CM

## 2018-07-24 DIAGNOSIS — K760 Fatty (change of) liver, not elsewhere classified: Secondary | ICD-10-CM

## 2018-07-24 DIAGNOSIS — N1831 Acute kidney failure, unspecified: Secondary | ICD-10-CM

## 2018-07-24 DIAGNOSIS — K560 Paralytic ileus: Secondary | ICD-10-CM

## 2018-07-24 DIAGNOSIS — G4733 Obstructive sleep apnea (adult) (pediatric): Secondary | ICD-10-CM

## 2018-07-24 LAB — CBC
HCT: 39.6 % (ref 36.0–46.0)
Hemoglobin: 12.7 g/dL (ref 12.0–15.0)
MCH: 27.7 pg (ref 26.0–34.0)
MCHC: 32.1 g/dL (ref 30.0–36.0)
MCV: 86.3 fL (ref 78.0–100.0)
Platelets: 364 10*3/uL (ref 150–400)
RBC: 4.59 MIL/uL (ref 3.87–5.11)
RDW: 13.2 % (ref 11.5–15.5)
WBC: 10.6 10*3/uL — AB (ref 4.0–10.5)

## 2018-07-24 LAB — BASIC METABOLIC PANEL
Anion gap: 10 (ref 5–15)
BUN: 6 mg/dL (ref 6–20)
CO2: 25 mmol/L (ref 22–32)
CREATININE: 1.08 mg/dL — AB (ref 0.44–1.00)
Calcium: 8.4 mg/dL — ABNORMAL LOW (ref 8.9–10.3)
Chloride: 106 mmol/L (ref 98–111)
GFR calc Af Amer: >60 mL/min (ref >60)
GFR calc non Af Amer: 57 mL/min — ABNORMAL LOW (ref >60)
Glucose, Bld: 128 mg/dL — ABNORMAL HIGH (ref 70–99)
Potassium: 3.4 mmol/L — ABNORMAL LOW (ref 3.5–5.1)
SODIUM: 141 mmol/L (ref 135–145)

## 2018-07-24 MED ORDER — POTASSIUM CHLORIDE CRYS ER 20 MEQ PO TBCR
40.0000 meq | EXTENDED_RELEASE_TABLET | Freq: Once | ORAL | Status: AC
  Administered 2018-07-24: 40 meq via ORAL
  Filled 2018-07-24: qty 2

## 2018-07-24 NOTE — Discharge Instructions (Signed)
You have a fatty liver as we discussed today which puts you at risk for developing cirrhosis. Losing weight may help it to resolve. You can discuss this further with your PCP or GI doctors to determined what type of testing has been done for determining the cause and what further testing and monitoring may be required.   You were cared for by a hospitalist during your hospital stay. If you have any questions about your discharge medications or the care you received while you were in the hospital after you are discharged, you can call the unit and asked to speak with the hospitalist on call if the hospitalist that took care of you is not available. Once you are discharged, your primary care physician will handle any further medical issues.   Please note that NO REFILLS for any discharge medications will be authorized once you are discharged, as it is imperative that you return to your primary care physician (or establish a relationship with a primary care physician if you do not have one) for your aftercare needs so that they can reassess your need for medications and monitor your lab values.  Please take all your medications with you for your next visit with your Primary MD. Please ask your Primary MD to get all Hospital records sent to his/her office. Please request your Primary MD to go over all hospital test results at the follow up.   If you experience worsening of your admission symptoms, develop shortness of breath, chest pain, suicidal or homicidal thoughts or a life threatening emergency, you must seek medical attention immediately by calling 911 or calling your MD.   Bonita QuinYou must read the complete instructions/literature along with all the possible adverse reactions/side effects for all the medicines you take including new medications that have been prescribed to you. Take new medicines after you have completely understood and accpet all the possible adverse reactions/side effects.    Do not drive  when taking pain medications or sedatives.     Do not take more than prescribed Pain, Sleep and Anxiety Medications   If you have smoked or chewed Tobacco in the last 2 yrs please stop. Stop any regular alcohol  and or recreational drug use.   Wear Seat belts while driving.

## 2018-07-24 NOTE — Progress Notes (Addendum)
Daily Rounding Note  07/24/2018, 11:38 AM  LOS: 4 days   SUBJECTIVE:   Chief complaint:     Received 60 mg MS contin, 20 mg Oxycodone yesterday which is her usual standing doses of narcotics.   Abdominal pain back to chronic, tolerable, low level.  No nausea Passing flatus, tolerated clears yest and this AM, to get soft diet starting at lunch.  No BM yet. IVF still running at 125/hour.    OBJECTIVE:         Vital signs in last 24 hours:    Temp:  [98.4 F (36.9 C)-98.5 F (36.9 C)] 98.5 F (36.9 C) (08/13 0828) Pulse Rate:  [97-117] 97 (08/13 0828) Resp:  [20] 20 (08/13 0828) BP: (117-150)/(77-95) 133/80 (08/13 0828) SpO2:  [90 %-96 %] 90 % (08/13 0830) Weight:  [98.7 kg] 98.7 kg (08/13 0600) Last BM Date: 07/19/18 Filed Weights   07/20/18 0109 07/22/18 0500 07/24/18 0600  Weight: 99.6 kg 97.7 kg 98.7 kg   General: looks better, looks somewhat chronically ill   Heart: RRR Chest: clear bil.  No dyspnea or cugh Abdomen: soft, active BS.  ND.  Minor upper abd discomfort  Extremities: no CCE Neuro/Psych:  Alert, calm, oriented x 3.    Intake/Output from previous day: 08/12 0701 - 08/13 0700 In: 1650 [I.V.:1500; NG/GT:150] Out: 800 [Urine:800]  Intake/Output this shift: Total I/O In: 360 [P.O.:360] Out: -   Lab Results: Recent Labs    07/22/18 0215 07/24/18 0206  WBC 10.4 10.6*  HGB 15.8* 12.7  HCT 47.6* 39.6  PLT 412* 364   BMET Recent Labs    07/22/18 0215 07/22/18 1857 07/23/18 0237 07/24/18 0206  NA 139  --  141 141  K 3.0* 3.6 3.6 3.4*  CL 97*  --  103 106  CO2 29  --  25 25  GLUCOSE 128*  --  123* 128*  BUN 6  --  9 6  CREATININE 1.10*  --  1.14* 1.08*  CALCIUM 9.1  --  8.5* 8.4*   LFT No results for input(s): PROT, ALBUMIN, AST, ALT, ALKPHOS, BILITOT, BILIDIR, IBILI in the last 72 hours. PT/INR No results for input(s): LABPROT, INR in the last 72 hours. Hepatitis Panel No  results for input(s): HEPBSAG, HCVAB, HEPAIGM, HEPBIGM in the last 72 hours.  Studies/Results: Ct Abdomen Pelvis Wo Contrast  Result Date: 07/23/2018 CLINICAL DATA:  Nausea and vomiting with paralytic ileus. Minimal abdominal distention. EXAM: CT ABDOMEN AND PELVIS WITHOUT CONTRAST TECHNIQUE: Multidetector CT imaging of the abdomen and pelvis was performed following the standard protocol without IV contrast. COMPARISON:  12/16/2017 FINDINGS: Lower chest: Atelectasis in the lung bases. Enteric tube is present in the esophagus. Hepatobiliary: Diffuse fatty infiltration of the liver. No focal lesions are identified. Surgical absence of the gallbladder. No bile duct dilatation. Pancreas: Unremarkable. No pancreatic ductal dilatation or surrounding inflammatory changes. Spleen: Normal in size without focal abnormality. Adrenals/Urinary Tract: Adrenal glands are unremarkable. Kidneys are normal, without renal calculi, focal lesion, or hydronephrosis. Bladder is unremarkable. Stomach/Bowel: Enteric tube terminates in the body of the stomach. Stomach, small bowel, and colon are mostly decompressed. Contrast material flows through to the rectum suggesting no evidence of obstruction. No wall thickening is appreciated although under distention limits evaluation of bowel wall. Appendix is not identified. Vascular/Lymphatic: Aortic atherosclerosis. No enlarged abdominal or pelvic lymph nodes. Reproductive: Calcified fibroid in the uterus. Ovaries are not enlarged. Other: No abdominal wall hernia  or abnormality. No abdominopelvic ascites. Musculoskeletal: No acute or significant osseous findings. IMPRESSION: No acute process demonstrated in the abdomen or pelvis. No evidence of bowel dilatation, obstruction or inflammation. Diffuse fatty infiltration of the liver. Aortic atherosclerosis. Calcified uterine fibroid. Electronically Signed   By: Burman NievesWilliam  Stevens M.D.   On: 07/23/2018 01:47   Dg Abd 1 View  Result Date:  07/23/2018 CLINICAL DATA:  Status post repositioning of NG tube this morning. EXAM: ABDOMEN - 1 VIEW COMPARISON:  None. FINDINGS: NG tube is in good position with the side-port in the stomach. Subsegmental atelectasis left lung base noted. IMPRESSION: NG tube in good position. Electronically Signed   By: Drusilla Kannerhomas  Dalessio M.D.   On: 07/23/2018 08:47   Dg Abd Portable 1v-small Bowel Obstruction Protocol-initial, 8 Hr Delay  Result Date: 07/23/2018 CLINICAL DATA:  Small bowel obstruction protocol. 8 hour delay. EXAM: PORTABLE ABDOMEN - 1 VIEW COMPARISON:  CT abdomen and pelvis 07/23/2018. FINDINGS: Contrast material is demonstrated throughout the colon suggesting no evidence of high-grade bowel obstruction. There a few mildly prominent gas-filled small bowel loops which could indicate ileus. An enteric tube is present with tip in the left upper quadrant consistent with location in the upper stomach. Surgical clips in the right upper quadrant. Calcification in the pelvis likely represents a uterine fibroid. IMPRESSION: Contrast material is demonstrated throughout the colon suggesting no evidence of high-grade bowel obstruction. Electronically Signed   By: Burman NievesWilliam  Stevens M.D.   On: 07/23/2018 03:48   Dg Abd Portable 1v-small Bowel Protocol-position Verification  Result Date: 07/22/2018 CLINICAL DATA:  NG tube placement. EXAM: PORTABLE ABDOMEN - 1 VIEW COMPARISON:  07/21/2018 FINDINGS: NG tube passes below the diaphragm into the stomach. Side hole of the tube lies at the level of the gastroesophageal junction. IMPRESSION: NG tube tip within the stomach. Electronically Signed   By: Amie Portlandavid  Ormond M.D.   On: 07/22/2018 17:53    ASSESMENT:   *   Nausea, vomiting, loose stools, weight loss, "stomach problems", s/p multiple "30" abd/pelvic surgeries.  Previous dx of IBS, chronic abd pain, chronic narcotics ( long and short acting).   Ileus vs enteritis on initial films.   Ct abd/pelvis # 1 on 07/19/18 with ?  obstruction at mid small bowel Ct abd/pelvis # 2 8/12.  no obstructive or inflammatory process.  Fatty liver and aortic vascular disease noted.  NGT removed 8/12.  Tolerating this well with resolution of acue GI sxs.   ? Narcotic s/e?  Gastorparesis?   No surgical plans per Dr Derrell Lollingamirez.    *    AKI.  hypokalemia.    *   Obesity, OSA    PLAN   *  Asked RN to call Dr Butler Denmarkizwan to get orders for po Potassium.  I am stopping pt's IVF that has been running for a few days.    *  If she tolerates her soft diet lunch, can discharge home.  [facility] [place] [facility] [place] where [place] has been seen in past.  PMD is at [facility].  Jennye Moccasin.     Sarah Gribbin  07/24/2018, 11:38 AM Phone [phone]    Attending physician's note   I have taken an interval history, reviewed the chart and examined the patient. I agree with the Advanced Practitioner's note, impression and recommendations. Resolving partial SBO vs resolving ileus in setting of chronic abdominal pain, chronic frequent nausea. A component of chronic nausea likely related to medication side effects. Stopped Zofran due to  QTc of 454. OK for discharge from GI standpoint. Follow up with PCP, established GI in [place], established Clinical research associate. GI signing off.   Claudette Head, MD FACG [phone] office

## 2018-07-24 NOTE — Discharge Summary (Signed)
Physician Discharge Summary  Hong Timm FAO:[id number] DOB: [birth year] DOA: 07/19/2018  PCP: Eartha Inch, MD  Admit date: 07/19/2018 Discharge date: 07/24/2018  Admitted From: home Disposition:  home   Recommendations for Outpatient Follow-up:  1. F/u with GI if symptoms presist    Discharge Condition:  stable   CODE STATUS:  Full code  Consultations:  GI  Gen surgery    Discharge Diagnoses:  Principal Problem:   Paralytic ileus (HCC) Active Problems:   Obstructive apnea   ARF (acute renal failure) (HCC)   Hypertensive urgency   Nausea & vomiting   Malnutrition of moderate degree   Fatty liver   Brief Summary: Deanna Yang is a 57y/o female with PMH of chronic pain on narcotics, anxiety/depression, HTN, fibromyalgia, COPD on 3.5 L O2 at home who presents with vomiting x 1 wk. CT abd/pelvis 8/8> IMPRESSION:  Probable partial mid small bowel obstruction, incompletely imaged. Abd Xray in ED: Loops of mildly dilated small bowel without air-fluid levels.   Hospital Course:  Principal Problem:   ARF (acute renal failure) / dehydration - baseline Cr about 1- 1.4- Cr about 2.4 on admission- improved to 1.08 today    Active Problems:  Nausea & vomiting-  Paralytic ileus  - GI consult requested - Xray abd shows a non-specific bowel gas pattern with no air fluid levels- not very suggestive of ileus  - she is on narcotics chronically but states she is not constipated-  - small bowel protocol does not reveal a SBO - NG removed yesterday- tolerating liquid diet- today she is able to tolerate solids and would like to be discharged. I feel she is stable at this time. 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 Due to vomiting  UTI  - culture shows < 10K colonies d/c antibiotics    Chronic pain/ Fibromyalgia - cont home medications   Anxiety/ depression - cont home meds       Discharge Exam: Vitals:   07/24/18 0828 07/24/18 0830  BP: 133/80   Pulse: 97   Resp: 20   Temp: 98.5 F (36.9 C)   SpO2: 93% (S) 90%   Vitals:   07/23/18 2338 07/24/18 0600 07/24/18 0828 07/24/18 0830  BP: (!) 117/95  133/80   Pulse: (!) 117  97   Resp:   20   Temp: 98.4 F (36.9 C)  98.5 F (36.9 C)   TempSrc: Oral  Oral   SpO2: 90%  93% (S) 90%  Weight:  98.7 kg    Height:        General: Pt is alert, awake, not in acute distress Cardiovascular: RRR, S1/S2 +, no rubs, no gallops Respiratory: CTA bilaterally, no wheezing, no rhonchi Abdominal: Soft, NT, ND, bowel sounds + Extremities: no edema, no cyanosis   Discharge Instructions  Discharge Instructions    Diet - low sodium heart healthy   Complete by:  As directed    Increase activity slowly   Complete by:  As directed  Allergies as of 07/24/2018      Reactions   Ambien [zolpidem Tartrate] Shortness Of Breath, Swelling   Tongue swelling, throat swelling.   Beta Adrenergic Blockers Anaphylaxis   Fish Allergy Shortness Of Breath, Rash   Iodinated Diagnostic Agents Itching   Benadryl 50MG  prophylaxis before and after and patient reports she did fine   Latex Itching, Other (See Comments)   Blisters skin   Metoprolol Other (See Comments)   ANY MEDICATION THAT ENDS IN -OLOL-   The patient is asthmatic.   Penicillins Shortness Of Breath, Rash, Other (See Comments)   Has patient had a PCN reaction causing immediate rash, facial/tongue/throat swelling, SOB or lightheadedness with hypotension: Yes Has patient had a  PCN reaction causing severe rash involving mucus membranes or skin necrosis: Unknown Has patient had a PCN reaction that required hospitalization No Has patient had a PCN reaction occurring within the last 10 years: No If all of the above answers are "NO", then may proceed with Cephalosporin use.   Red Dye Hives, Shortness Of Breath   Shellfish Allergy Anaphylaxis   Sulfa Antibiotics Hives, Shortness Of Breath   Amoxicillin-pot Clavulanate Nausea And Vomiting   Aspirin Other (See Comments)   Blisters on tongue from higher doses - tolerates 81 mg   Dye Fdc Red  [amaranth (fd&c Red #2)] Hives   X ray DYE  (BENADRYL 50 MG PROPHYLAXIS AND AFTER AND  SHE DID FINE, PER PATIENT.).   Tape Dermatitis, Rash   Adhesive   Talwin [pentazocine] Other (See Comments)   Unknown reaction   Levofloxacin Rash, Other (See Comments)   headache   Seroquel [quetiapine Fumarate] Other (See Comments)   hallucinate      Medication List    STOP taking these medications   busPIRone 10 MG tablet Commonly known as:  BUSPAR   busPIRone 15 MG tablet Commonly known as:  BUSPAR   dextromethorphan-guaiFENesin 30-600 MG 12hr tablet Commonly known as:  MUCINEX DM   ibuprofen 200 MG tablet Commonly known as:  ADVIL,MOTRIN     TAKE these medications   acyclovir ointment 5 % Commonly known as:  ZOVIRAX Apply 1 application topically every 3 (three) hours as needed (mouth sores).   amLODipine 10 MG tablet Commonly known as:  NORVASC Take 10 mg by mouth daily.   budesonide-formoterol 160-4.5 MCG/ACT inhaler Commonly known as:  SYMBICORT Inhale 2 puffs into the lungs 2 (two) times daily.   buPROPion 300 MG 24 hr tablet Commonly known as:  WELLBUTRIN XL Take 1 tablet (300 mg total) by mouth daily.   cetirizine 10 MG tablet Commonly known as:  ZYRTEC Take 10 mg by mouth daily.   citalopram 40 MG tablet Commonly known as:  CELEXA Take 40 mg by mouth daily.   diazepam 2 MG tablet Commonly known as:   VALIUM Take 1 tablet (2 mg total) by mouth every 4 (four) hours as needed for muscle spasms.   fluticasone 50 MCG/ACT nasal spray Commonly known as:  FLONASE Place 1 spray into both nostrils 2 (two) times daily.   gabapentin 100 MG capsule Commonly known as:  NEURONTIN Take 2 capsules (200 mg total) by mouth 3 (three) times daily.   gabapentin 600 MG tablet Commonly known as:  NEURONTIN Take 1,800 mg by mouth at bedtime.   hydrochlorothiazide 25 MG tablet Commonly known as:  HYDRODIURIL Take 25 mg by mouth daily.   hydrOXYzine 25 MG tablet Commonly known as:  ATARAX/VISTARIL Take 1 tablet (25 mg total)  by mouth every 6 (six) hours as needed for anxiety (mild anxiety). What changed:  reasons to take this   Immune Globulin 10% 10 GM/100ML Soln Inject into the vein every 30 (thirty) days.   ipratropium-albuterol 0.5-2.5 (3) MG/3ML Soln Commonly known as:  DUONEB Inhale 3 mLs into the lungs every 4 (four) hours as needed for wheezing or shortness of breath.   montelukast 10 MG tablet Commonly known as:  SINGULAIR Take 10 mg by mouth at bedtime.   morphine 30 MG 12 hr tablet Commonly known as:  MS CONTIN Take 1 tablet (30 mg total) by mouth every 12 (twelve) hours.   nystatin powder Generic drug:  nystatin Apply 1 application topically 3 (three) times daily as needed (blistering sores under breast).   oxyCODONE-acetaminophen 10-325 MG tablet Commonly known as:  PERCOCET Take 1 tablet by mouth 4 (four) times daily. Hold for sedation   prazosin 2 MG capsule Commonly known as:  MINIPRESS Take 2 mg by mouth at bedtime.   PROAIR HFA 108 (90 Base) MCG/ACT inhaler Generic drug:  albuterol Inhale 2 puffs into the lungs every 4 (four) hours as needed for wheezing.   promethazine 25 MG tablet Commonly known as:  PHENERGAN Take 25 mg by mouth every 8 (eight) hours as needed for nausea/vomiting.   tiZANidine 4 MG tablet Commonly known as:  ZANAFLEX Take 4 mg by mouth 3  (three) times daily.      Follow-up Information    Eartha Inch, MD Follow up in 1 week(s).   Specialty:  Family Medicine Contact information: [address] [place] [address] [phone]          Allergies  Allergen Reactions  . Ambien [Zolpidem Tartrate] Shortness Of Breath and Swelling    Tongue swelling, throat swelling.   . Beta Adrenergic Blockers Anaphylaxis  . Fish Allergy Shortness Of Breath and Rash  . Iodinated Diagnostic Agents Itching    Benadryl 50MG  prophylaxis before and after and patient reports she did fine  . Latex Itching and Other (See Comments)    Blisters skin  . Metoprolol Other (See Comments)    ANY MEDICATION THAT ENDS IN -OLOL-   The patient is asthmatic.  Marland Kitchen Penicillins Shortness Of Breath, Rash and Other (See Comments)    Has patient had a PCN reaction causing immediate rash, facial/tongue/throat swelling, SOB or lightheadedness with hypotension: Yes Has patient had a PCN reaction causing severe rash involving mucus membranes or skin necrosis: Unknown Has patient had a PCN reaction that required hospitalization No Has patient had a PCN reaction occurring within the last 10 years: No If all of the above answers are "NO", then may proceed with Cephalosporin use.   . Red Dye Hives and Shortness Of Breath  . Shellfish Allergy Anaphylaxis  . Sulfa Antibiotics Hives and Shortness Of Breath  . Amoxicillin-Pot Clavulanate Nausea And Vomiting  . Aspirin Other (See Comments)    Blisters on tongue from higher doses - tolerates 81 mg   . Dye Fdc Red  [Amaranth (Fd&C Red #2)] Hives    X ray DYE  (BENADRYL 50 MG PROPHYLAXIS AND AFTER AND  SHE DID FINE, PER PATIENT.).  Marland Kitchen Tape Dermatitis and Rash    Adhesive   . Talwin [Pentazocine] Other (See Comments)    Unknown reaction  . Levofloxacin Rash and Other (See Comments)    headache  . Seroquel [Quetiapine Fumarate] Other (See Comments)    hallucinate     Procedures/Studies:  NG  tube  Ct Abdomen Pelvis Wo Contrast  Result Date: 07/23/2018 CLINICAL DATA:  Nausea and vomiting with paralytic ileus. Minimal abdominal distention. EXAM: CT ABDOMEN AND PELVIS WITHOUT CONTRAST TECHNIQUE: Multidetector CT imaging of the abdomen and pelvis was performed following the standard protocol without IV contrast. COMPARISON:  12/16/2017 FINDINGS: Lower chest: Atelectasis in the lung bases. Enteric tube is present in the esophagus. Hepatobiliary: Diffuse fatty infiltration of the liver. No focal lesions are identified. Surgical absence of the gallbladder. No bile duct dilatation. Pancreas: Unremarkable. No pancreatic ductal dilatation or surrounding inflammatory changes. Spleen: Normal in size without focal abnormality. Adrenals/Urinary Tract: Adrenal glands are unremarkable. Kidneys are normal, without renal calculi, focal lesion, or hydronephrosis. Bladder is unremarkable. Stomach/Bowel: Enteric tube terminates in the body of the stomach. Stomach, small bowel, and colon are mostly decompressed. Contrast material flows through to the rectum suggesting no evidence of obstruction. No wall thickening is appreciated although under distention limits evaluation of bowel wall. Appendix is not identified. Vascular/Lymphatic: Aortic atherosclerosis. No enlarged abdominal or pelvic lymph nodes. Reproductive: Calcified fibroid in the uterus. Ovaries are not enlarged. Other: No abdominal wall hernia or abnormality. No abdominopelvic ascites. Musculoskeletal: No acute or significant osseous findings. IMPRESSION: No acute process demonstrated in the abdomen or pelvis. No evidence of bowel dilatation, obstruction or inflammation. Diffuse fatty infiltration of the liver. Aortic atherosclerosis. Calcified uterine fibroid. Electronically Signed   By: Burman NievesWilliam  Stevens M.D.   On: 07/23/2018 01:47   Dg Knee 2 Views Right  Result Date: 07/13/2018 CLINICAL DATA:  Right knee pain following a fall 1 week ago. EXAM: RIGHT KNEE  - 1-2 VIEW COMPARISON:  02/04/2018. FINDINGS: Mild to moderate medial joint space narrowing mild progression. Mild medial spur formation with mild progression. There may be a fracture through the medial tibial spur. Otherwise, no fracture or dislocation is seen. A small effusion is noted. IMPRESSION: 1. Possible fracture through the base of a medial tibial spur. Otherwise, no fracture seen, limited by the lack of oblique views. 2. Small effusion. Electronically Signed   By: Beckie SaltsSteven  Reid M.D.   On: 07/13/2018 18:37   Dg Abd 1 View  Result Date: 07/23/2018 CLINICAL DATA:  Status post repositioning of NG tube this morning. EXAM: ABDOMEN - 1 VIEW COMPARISON:  None. FINDINGS: NG tube is in good position with the side-port in the stomach. Subsegmental atelectasis left lung base noted. IMPRESSION: NG tube in good position. Electronically Signed   By: Drusilla Kannerhomas  Dalessio M.D.   On: 07/23/2018 08:47   Dg Abd Acute W/chest  Result Date: 07/19/2018 CLINICAL DATA:  Abdominal pain with nausea and vomiting EXAM: DG ABDOMEN ACUTE W/ 1V CHEST COMPARISON:  Chest radiograph May 17, 2018; CT abdomen and pelvis December 16, 2017 FINDINGS: PA chest: There is atelectatic change in the left base. The lungs elsewhere clear. Heart size and pulmonary vascularity are normal. No adenopathy. There is postoperative change in the lower cervical region. Supine and upright abdomen: There are loops of mildly dilated small bowel without appreciable air-fluid level. No evident free air. There are surgical clips in the right upper abdomen. There are phleboliths in the pelvis as well as a small calcified uterine leiomyoma. IMPRESSION: Loops of mildly dilated small bowel without air-fluid levels. Question enteritis or early ileus. Bowel obstruction felt to be less likely. No free air. There is left base atelectasis. Lungs elsewhere clear. There is a small calcified uterine leiomyoma. Electronically Signed   By: Bretta BangWilliam  Woodruff III M.D.   On:  07/19/2018 21:46   Dg Abd Portable 1v-small Bowel Obstruction Protocol-initial, 8 Hr Delay  Result Date: 07/23/2018 CLINICAL DATA:  Small bowel obstruction protocol. 8 hour delay. EXAM: PORTABLE ABDOMEN - 1 VIEW COMPARISON:  CT abdomen and pelvis 07/23/2018. FINDINGS: Contrast material is demonstrated throughout the colon suggesting no evidence of high-grade bowel obstruction. There a few mildly prominent gas-filled small bowel loops which could indicate ileus. An enteric tube is present with tip in the left upper quadrant consistent with location in the upper stomach. Surgical clips in the right upper quadrant. Calcification in the pelvis likely represents a uterine fibroid. IMPRESSION: Contrast material is demonstrated throughout the colon suggesting no evidence of high-grade bowel obstruction. Electronically Signed   By: Burman Nieves M.D.   On: 07/23/2018 03:48   Dg Abd Portable 1v-small Bowel Protocol-position Verification  Result Date: 07/22/2018 CLINICAL DATA:  NG tube placement. EXAM: PORTABLE ABDOMEN - 1 VIEW COMPARISON:  07/21/2018 FINDINGS: NG tube passes below the diaphragm into the stomach. Side hole of the tube lies at the level of the gastroesophageal junction. IMPRESSION: NG tube tip within the stomach. Electronically Signed   By: Amie Portland M.D.   On: 07/22/2018 17:53   Dg Abd Portable 1v  Result Date: 07/20/2018 CLINICAL DATA:  Nasogastric tube placement. EXAM: PORTABLE ABDOMEN - 1 VIEW COMPARISON:  Abdominal radiograph performed 07/19/2018 FINDINGS: The patient's enteric tube is seen ending overlying the body of the stomach, with the side port about the fundus of the stomach. The visualized bowel gas pattern is grossly unremarkable. Clips are noted at the right upper quadrant, reflecting prior cholecystectomy. The visualized portions of the lungs are grossly clear. No acute osseous abnormalities are seen. IMPRESSION: Enteric tube noted ending overlying the body of the stomach.  Electronically Signed   By: Roanna Raider M.D.   On: 07/20/2018 02:51   Dg Abd Portable 2v  Result Date: 07/21/2018 CLINICAL DATA:  Evaluate vomiting EXAM: PORTABLE ABDOMEN - 2 VIEW COMPARISON:  July 20, 2018 FINDINGS: The NG tube terminates in the left upper quadrant, likely a stomach. Mild atelectasis in the left base. Lung bases otherwise normal. Cholecystectomy clips. No free air, portal venous gas, or pneumatosis. No evidence of bowel obstruction. Possible small calcified fibroid in the uterus. No other abnormalities. IMPRESSION: The NG tube is in good position. No cause for the patient's symptoms identified. Electronically Signed   By: Gerome Sam III M.D   On: 07/21/2018 10:09     The results of significant diagnostics from this hospitalization (including imaging, microbiology, ancillary and laboratory) are listed below for reference.     Microbiology: Recent Results (from the past 240 hour(s))  Culture, Urine     Status: Abnormal   Collection Time: 07/19/18  8:48 PM  Result Value Ref Range Status   Specimen Description URINE, RANDOM  Final   Special Requests NONE  Final   Culture (A)  Final    <10,000 COLONIES/mL INSIGNIFICANT GROWTH Performed at [facility], [address] [place], [place] [address]    Report Status 07/21/2018 FINAL  Final     Labs: BNP (last 3 results) No results for input(s): BNP in the last 8760 hours. Basic Metabolic Panel: Recent Labs  Lab 07/20/18 0256 07/21/18 0304 07/22/18 0215 07/22/18 1153 07/22/18 1857 07/23/18 0237 07/24/18 0206  NA 143 141 139  --   --  141 141  K 3.1* 2.9* 3.0*  --  3.6 3.6 3.4*  CL 101 103 97*  --   --  103 106  CO2 26 26 29   --   --  25 25  GLUCOSE 128* 155* 128*  --   --  123* 128*  BUN 16 8 6   --   --  9 6  CREATININE 2.32* 1.21* 1.10*  --   --  1.14* 1.08*  CALCIUM 9.1 8.5* 9.1  --   --  8.5* 8.4*  MG  --  1.3*  --  1.7  --  1.6*  --    Liver Function Tests: Recent Labs  Lab 07/19/18 1901  07/20/18 0256  AST 48* 55*  ALT 47* 49*  ALKPHOS 95 91  BILITOT 0.7 1.1  PROT 7.2 6.7  ALBUMIN 4.1 3.8   Recent Labs  Lab 07/19/18 1901  LIPASE 37   No results for input(s): AMMONIA in the last 168 hours. CBC: Recent Labs  Lab 07/19/18 1901 07/20/18 0256 07/21/18 0304 07/22/18 0215 07/24/18 0206  WBC 11.7* 13.9* 9.4 10.4 10.6*  HGB 15.6* 14.4 13.9 15.8* 12.7  HCT 48.5* 44.3 42.2 47.6* 39.6  MCV 86.6 86.2 85.6 84.7 86.3  PLT 393 391 335 412* 364   Cardiac Enzymes: No results for input(s): CKTOTAL, CKMB, CKMBINDEX, TROPONINI in the last 168 hours. BNP: Invalid input(s): POCBNP CBG: No results for input(s): GLUCAP in the last 168 hours. D-Dimer No results for input(s): DDIMER in the last 72 hours. Hgb A1c No results for input(s): HGBA1C in the last 72 hours. Lipid Profile No results for input(s): CHOL, HDL, LDLCALC, TRIG, CHOLHDL, LDLDIRECT in the last 72 hours. Thyroid function studies No results for input(s): TSH, T4TOTAL, T3FREE, THYROIDAB in the last 72 hours.  Invalid input(s): FREET3 Anemia work up No results for input(s): VITAMINB12, FOLATE, FERRITIN, TIBC, IRON, RETICCTPCT in the last 72 hours. Urinalysis    Component Value Date/Time   COLORURINE YELLOW 07/19/2018 2048   APPEARANCEUR HAZY (A) 07/19/2018 2048   LABSPEC 1.016 07/19/2018 2048   PHURINE 5.0 07/19/2018 2048   GLUCOSEU 50 (A) 07/19/2018 2048   HGBUR SMALL (A) 07/19/2018 2048   BILIRUBINUR NEGATIVE 07/19/2018 2048   KETONESUR NEGATIVE 07/19/2018 2048   PROTEINUR 30 (A) 07/19/2018 2048   NITRITE NEGATIVE 07/19/2018 2048   LEUKOCYTESUR TRACE (A) 07/19/2018 2048   Sepsis Labs Invalid input(s): PROCALCITONIN,  WBC,  LACTICIDVEN Microbiology Recent Results (from the past 240 hour(s))  Culture, Urine     Status: Abnormal   Collection Time: 07/19/18  8:48 PM  Result Value Ref Range Status   Specimen Description URINE, RANDOM  Final   Special Requests NONE  Final   Culture (A)  Final     <10,000 COLONIES/mL INSIGNIFICANT GROWTH Performed at [facility], [address], [place], [place] [address]    Report Status 07/21/2018 FINAL  Final     Time coordinating discharge in minutes: 60  SIGNED:   Calvert Cantor, MD  [facility] 07/24/2018, 2:09 PM Pager   If 7PM-7AM, please contact night-coverage [email or web address] Password [email or web address]

## 2018-08-06 ENCOUNTER — Encounter (HOSPITAL_COMMUNITY): Payer: Self-pay | Admitting: Emergency Medicine

## 2018-08-06 ENCOUNTER — Emergency Department (HOSPITAL_COMMUNITY): Payer: Medicare Other

## 2018-08-06 ENCOUNTER — Other Ambulatory Visit: Payer: Self-pay

## 2018-08-06 ENCOUNTER — Observation Stay (HOSPITAL_COMMUNITY)
Admission: EM | Admit: 2018-08-06 | Discharge: 2018-08-09 | Disposition: A | Discharge status: Home / Self Care | Payer: Medicare Other | Attending: Internal Medicine | Admitting: Internal Medicine

## 2018-08-06 DIAGNOSIS — R5382 Chronic fatigue, unspecified: Secondary | ICD-10-CM | POA: Insufficient documentation

## 2018-08-06 DIAGNOSIS — N179 Acute kidney failure, unspecified: Secondary | ICD-10-CM | POA: Diagnosis not present

## 2018-08-06 DIAGNOSIS — R112 Nausea with vomiting, unspecified: Secondary | ICD-10-CM | POA: Diagnosis not present

## 2018-08-06 DIAGNOSIS — Z87891 Personal history of nicotine dependence: Secondary | ICD-10-CM | POA: Insufficient documentation

## 2018-08-06 DIAGNOSIS — K5903 Drug induced constipation: Principal | ICD-10-CM | POA: Diagnosis present

## 2018-08-06 DIAGNOSIS — F603 Borderline personality disorder: Secondary | ICD-10-CM | POA: Diagnosis not present

## 2018-08-06 DIAGNOSIS — F319 Bipolar disorder, unspecified: Secondary | ICD-10-CM | POA: Insufficient documentation

## 2018-08-06 DIAGNOSIS — Z79891 Long term (current) use of opiate analgesic: Secondary | ICD-10-CM | POA: Insufficient documentation

## 2018-08-06 DIAGNOSIS — G4733 Obstructive sleep apnea (adult) (pediatric): Secondary | ICD-10-CM | POA: Diagnosis present

## 2018-08-06 DIAGNOSIS — F4310 Post-traumatic stress disorder, unspecified: Secondary | ICD-10-CM | POA: Diagnosis not present

## 2018-08-06 DIAGNOSIS — G8929 Other chronic pain: Secondary | ICD-10-CM | POA: Insufficient documentation

## 2018-08-06 DIAGNOSIS — M797 Fibromyalgia: Secondary | ICD-10-CM | POA: Diagnosis not present

## 2018-08-06 DIAGNOSIS — Z6835 Body mass index (BMI) 35.0-35.9, adult: Secondary | ICD-10-CM | POA: Insufficient documentation

## 2018-08-06 DIAGNOSIS — E440 Moderate protein-calorie malnutrition: Secondary | ICD-10-CM | POA: Diagnosis not present

## 2018-08-06 DIAGNOSIS — E861 Hypovolemia: Secondary | ICD-10-CM | POA: Diagnosis not present

## 2018-08-06 DIAGNOSIS — K5900 Constipation, unspecified: Secondary | ICD-10-CM | POA: Diagnosis present

## 2018-08-06 DIAGNOSIS — R1084 Generalized abdominal pain: Secondary | ICD-10-CM

## 2018-08-06 DIAGNOSIS — I160 Hypertensive urgency: Secondary | ICD-10-CM | POA: Diagnosis not present

## 2018-08-06 DIAGNOSIS — Z880 Allergy status to penicillin: Secondary | ICD-10-CM | POA: Insufficient documentation

## 2018-08-06 DIAGNOSIS — R000 Tachycardia, unspecified: Secondary | ICD-10-CM | POA: Diagnosis not present

## 2018-08-06 DIAGNOSIS — K760 Fatty (change of) liver, not elsewhere classified: Secondary | ICD-10-CM | POA: Diagnosis not present

## 2018-08-06 DIAGNOSIS — Z9981 Dependence on supplemental oxygen: Secondary | ICD-10-CM | POA: Insufficient documentation

## 2018-08-06 DIAGNOSIS — N1831 Acute kidney failure, unspecified: Secondary | ICD-10-CM | POA: Diagnosis present

## 2018-08-06 DIAGNOSIS — G629 Polyneuropathy, unspecified: Secondary | ICD-10-CM | POA: Insufficient documentation

## 2018-08-06 DIAGNOSIS — Z888 Allergy status to other drugs, medicaments and biological substances status: Secondary | ICD-10-CM | POA: Insufficient documentation

## 2018-08-06 DIAGNOSIS — R9431 Abnormal electrocardiogram [ECG] [EKG]: Secondary | ICD-10-CM | POA: Diagnosis present

## 2018-08-06 DIAGNOSIS — E876 Hypokalemia: Secondary | ICD-10-CM | POA: Insufficient documentation

## 2018-08-06 DIAGNOSIS — I4581 Long QT syndrome: Secondary | ICD-10-CM | POA: Diagnosis not present

## 2018-08-06 DIAGNOSIS — F418 Other specified anxiety disorders: Secondary | ICD-10-CM

## 2018-08-06 DIAGNOSIS — Z79899 Other long term (current) drug therapy: Secondary | ICD-10-CM | POA: Insufficient documentation

## 2018-08-06 DIAGNOSIS — J449 Chronic obstructive pulmonary disease, unspecified: Secondary | ICD-10-CM | POA: Diagnosis not present

## 2018-08-06 DIAGNOSIS — Z9049 Acquired absence of other specified parts of digestive tract: Secondary | ICD-10-CM | POA: Diagnosis not present

## 2018-08-06 DIAGNOSIS — Z818 Family history of other mental and behavioral disorders: Secondary | ICD-10-CM | POA: Insufficient documentation

## 2018-08-06 DIAGNOSIS — Z9660 Presence of unspecified orthopedic joint implant: Secondary | ICD-10-CM | POA: Insufficient documentation

## 2018-08-06 DIAGNOSIS — Z886 Allergy status to analgesic agent status: Secondary | ICD-10-CM | POA: Insufficient documentation

## 2018-08-06 DIAGNOSIS — Z882 Allergy status to sulfonamides status: Secondary | ICD-10-CM | POA: Insufficient documentation

## 2018-08-06 DIAGNOSIS — T402X5A Adverse effect of other opioids, initial encounter: Secondary | ICD-10-CM | POA: Insufficient documentation

## 2018-08-06 DIAGNOSIS — Z811 Family history of alcohol abuse and dependence: Secondary | ICD-10-CM | POA: Insufficient documentation

## 2018-08-06 LAB — URINALYSIS, ROUTINE W REFLEX MICROSCOPIC
Bilirubin Urine: NEGATIVE
GLUCOSE, UA: NEGATIVE mg/dL
Hgb urine dipstick: NEGATIVE
KETONES UR: NEGATIVE mg/dL
Leukocytes, UA: NEGATIVE
NITRITE: NEGATIVE
PROTEIN: NEGATIVE mg/dL
Specific Gravity, Urine: 1.006 (ref 1.005–1.030)
pH: 8 (ref 5.0–8.0)

## 2018-08-06 LAB — CBC
HCT: 47 % — ABNORMAL HIGH (ref 36.0–46.0)
Hemoglobin: 14.9 g/dL (ref 12.0–15.0)
MCH: 27.4 pg (ref 26.0–34.0)
MCHC: 31.7 g/dL (ref 30.0–36.0)
MCV: 86.4 fL (ref 78.0–100.0)
PLATELETS: 504 10*3/uL — AB (ref 150–400)
RBC: 5.44 MIL/uL — AB (ref 3.87–5.11)
RDW: 13.2 % (ref 11.5–15.5)
WBC: 10 10*3/uL (ref 4.0–10.5)

## 2018-08-06 LAB — COMPREHENSIVE METABOLIC PANEL
ALT: 30 U/L (ref 0–44)
AST: 29 U/L (ref 15–41)
Albumin: 3.8 g/dL (ref 3.5–5.0)
Alkaline Phosphatase: 95 U/L (ref 38–126)
Anion gap: 13 (ref 5–15)
BILIRUBIN TOTAL: 0.7 mg/dL (ref 0.3–1.2)
BUN: 9 mg/dL (ref 6–20)
CO2: 29 mmol/L (ref 22–32)
CREATININE: 1.63 mg/dL — AB (ref 0.44–1.00)
Calcium: 9.5 mg/dL (ref 8.9–10.3)
Chloride: 98 mmol/L (ref 98–111)
GFR, EST AFRICAN AMERICAN: 40 mL/min — AB (ref >60)
GFR, EST NON AFRICAN AMERICAN: 34 mL/min — AB (ref >60)
Glucose, Bld: 123 mg/dL — ABNORMAL HIGH (ref 70–99)
Potassium: 3.4 mmol/L — ABNORMAL LOW (ref 3.5–5.1)
Sodium: 140 mmol/L (ref 135–145)
TOTAL PROTEIN: 7.6 g/dL (ref 6.5–8.1)

## 2018-08-06 LAB — I-STAT BETA HCG BLOOD, ED (MC, WL, AP ONLY)

## 2018-08-06 LAB — LIPASE, BLOOD: Lipase: 31 U/L (ref 11–51)

## 2018-08-06 MED ORDER — SODIUM CHLORIDE 0.9 % IV BOLUS
1000.0000 mL | Freq: Once | INTRAVENOUS | Status: AC
  Administered 2018-08-06: 1000 mL via INTRAVENOUS

## 2018-08-06 MED ORDER — ACETAMINOPHEN 650 MG RE SUPP: 650.0000 mg | Freq: Four times a day (QID) | RECTAL | Status: DC | PRN

## 2018-08-06 MED ORDER — MORPHINE SULFATE (PF) 2 MG/ML IV SOLN
2.0000 mg | INTRAVENOUS | Status: AC | PRN
  Administered 2018-08-07 (×4): 2 mg via INTRAVENOUS
  Filled 2018-08-06 (×4): qty 1

## 2018-08-06 MED ORDER — MONTELUKAST SODIUM 10 MG PO TABS
10.0000 mg | ORAL_TABLET | Freq: Every day | ORAL | Status: DC
  Administered 2018-08-07 – 2018-08-08 (×2): 10 mg via ORAL
  Filled 2018-08-06 (×2): qty 1

## 2018-08-06 MED ORDER — SORBITOL 70 % SOLN
960.0000 mL | TOPICAL_OIL | Freq: Once | ORAL | Status: AC
  Administered 2018-08-07: 960 mL via RECTAL
  Filled 2018-08-06 (×2): qty 473

## 2018-08-06 MED ORDER — POTASSIUM CHLORIDE 10 MEQ/100ML IV SOLN
10.0000 meq | INTRAVENOUS | Status: AC
  Administered 2018-08-07 (×3): 10 meq via INTRAVENOUS
  Filled 2018-08-06 (×3): qty 100

## 2018-08-06 MED ORDER — LORAZEPAM 2 MG/ML IJ SOLN
0.5000 mg | Freq: Once | INTRAMUSCULAR | Status: AC
  Administered 2018-08-07: 0.5 mg via INTRAVENOUS
  Filled 2018-08-06: qty 1

## 2018-08-06 MED ORDER — BARIUM SULFATE 2.1 % PO SUSP
ORAL | Status: AC
  Filled 2018-08-06: qty 2

## 2018-08-06 MED ORDER — SODIUM CHLORIDE 0.9% FLUSH
3.0000 mL | Freq: Two times a day (BID) | INTRAVENOUS | Status: DC
  Administered 2018-08-07 – 2018-08-09 (×4): 3 mL via INTRAVENOUS

## 2018-08-06 MED ORDER — NYSTATIN 100000 UNIT/GM EX POWD: Freq: Three times a day (TID) | CUTANEOUS | Status: DC | PRN

## 2018-08-06 MED ORDER — ACETAMINOPHEN 325 MG PO TABS
650.0000 mg | ORAL_TABLET | Freq: Four times a day (QID) | ORAL | Status: DC | PRN
  Administered 2018-08-08 (×2): 650 mg via ORAL
  Filled 2018-08-06 (×2): qty 2

## 2018-08-06 MED ORDER — ONDANSETRON 4 MG PO TBDP
4.0000 mg | ORAL_TABLET | Freq: Once | ORAL | Status: AC
  Administered 2018-08-06: 4 mg via ORAL
  Filled 2018-08-06: qty 1

## 2018-08-06 MED ORDER — AMLODIPINE BESYLATE 5 MG PO TABS
10.0000 mg | ORAL_TABLET | Freq: Every day | ORAL | Status: DC
  Administered 2018-08-07 – 2018-08-09 (×3): 10 mg via ORAL
  Filled 2018-08-06 (×3): qty 2

## 2018-08-06 MED ORDER — PROMETHAZINE HCL 25 MG/ML IJ SOLN
25.0000 mg | Freq: Once | INTRAMUSCULAR | Status: AC
Start: 2018-08-06 — End: 2018-08-06
  Administered 2018-08-06: 25 mg via INTRAVENOUS
  Filled 2018-08-06: qty 1

## 2018-08-06 MED ORDER — PRAZOSIN HCL 2 MG PO CAPS
2.0000 mg | ORAL_CAPSULE | Freq: Every day | ORAL | Status: DC
  Administered 2018-08-07 – 2018-08-08 (×3): 2 mg via ORAL
  Filled 2018-08-06 (×4): qty 1

## 2018-08-06 MED ORDER — SODIUM CHLORIDE 0.9 % IV SOLN
INTRAVENOUS | Status: DC
  Administered 2018-08-07 (×2): via INTRAVENOUS

## 2018-08-06 MED ORDER — MORPHINE SULFATE (PF) 4 MG/ML IV SOLN
4.0000 mg | Freq: Once | INTRAVENOUS | Status: AC
  Administered 2018-08-06: 4 mg via INTRAVENOUS
  Filled 2018-08-06: qty 1

## 2018-08-06 MED ORDER — LORAZEPAM 2 MG/ML IJ SOLN
0.5000 mg | Freq: Four times a day (QID) | INTRAMUSCULAR | Status: DC | PRN
  Administered 2018-08-07: 0.5 mg via INTRAVENOUS
  Filled 2018-08-06: qty 1

## 2018-08-06 MED ORDER — BUPROPION HCL ER (XL) 150 MG PO TB24
300.0000 mg | ORAL_TABLET | Freq: Every day | ORAL | Status: DC
  Administered 2018-08-07 – 2018-08-09 (×3): 300 mg via ORAL
  Filled 2018-08-06 (×3): qty 2

## 2018-08-06 MED ORDER — CITALOPRAM HYDROBROMIDE 10 MG PO TABS: 40.0000 mg | ORAL_TABLET | Freq: Every day | ORAL | Status: DC

## 2018-08-06 MED ORDER — SENNA 8.6 MG PO TABS
1.0000 | ORAL_TABLET | Freq: Two times a day (BID) | ORAL | Status: DC
  Administered 2018-08-07 – 2018-08-09 (×5): 8.6 mg via ORAL
  Filled 2018-08-06 (×5): qty 1

## 2018-08-06 MED ORDER — ENOXAPARIN SODIUM 40 MG/0.4ML ~~LOC~~ SOLN
40.0000 mg | Freq: Every day | SUBCUTANEOUS | Status: DC
  Administered 2018-08-07 – 2018-08-08 (×3): 40 mg via SUBCUTANEOUS
  Filled 2018-08-06 (×3): qty 0.4

## 2018-08-06 MED ORDER — GABAPENTIN 600 MG PO TABS
1800.0000 mg | ORAL_TABLET | Freq: Every day | ORAL | Status: DC
  Administered 2018-08-07 – 2018-08-08 (×2): 1800 mg via ORAL
  Filled 2018-08-06 (×2): qty 3

## 2018-08-06 NOTE — H&P (Signed)
History and Physical    Deanna MansDolores Woodlief ZOX:[id number] DOB: Dec 23, 1960 DOA: 08/06/2018  Referring MD/NP/PA: Jodi GeraldsKelsey Ford, PA-C PCP: Eartha InchBadger, Michael C, MD  Patient coming from:  Home via EMS  Chief Complaint: Constipation  I have personally briefly reviewed patient's old medical records in [facility]   HPI: Deanna Yang is a 57 y.o. female with medical history significant of HTN, chronic pain, depression, sleep apnea; who presents with complaints of constipation.  Patient reports not having a bowel movement over 14 days.  She reports that she has not passed gas since the day of leaving the hospital.  She is tried suppositories, at least 3 Fleet enemas, mag citrate, and others without relief of symptoms.  Associated symptoms include malaise, nausea, vomiting, and epigastric pain that she describes as "tightness feeling".  Patient reports normally not looking at emesis, but reports seeing some brown liquid in the trash can at home previously.  Last admitted into the hospital on 8/8-8/13 for paralytic ileus and acute renal failure.  GI have been evaluated patient during last hospitalization who recommended tapering narcotics of ileus recurs has no other cause was found for symptoms.  Patient denies having any fever, chills, chest pain, diarrhea, or dysuria.    ED Course: Upon admission into the emergency department patient was seen to be afebrile, heart rates 10 4-106, blood pressure elevated up to 193/104, and all other vital signs maintained.  Labs revealed WBC 10, platelets 504, potassium 3.4, BUN 9, and creatinine 1.63.  Urinalysis was negative for signs of infection.  CT scan of the abdomen showed a large amount of stool without signs of obstruction.  Patient was given morphine 4 mg, Zofran, Phenergan, 1 L of normal saline IV fluids.  TRH called to admit.   Review of Systems  Constitutional: Positive for malaise/fatigue. Negative for chills and fever.  HENT: Negative for ear pain and  nosebleeds.   Eyes: Negative for photophobia and pain.  Respiratory: Negative for cough and shortness of breath.   Cardiovascular: Negative for chest pain, claudication and leg swelling.  Gastrointestinal: Positive for abdominal pain, constipation, nausea and vomiting.  Genitourinary: Negative for dysuria and hematuria.  Musculoskeletal: Positive for myalgias. Negative for falls and joint pain.  Neurological: Positive for weakness. Negative for focal weakness and loss of consciousness.  Endo/Heme/Allergies: Negative for polydipsia. Does not bruise/bleed easily.  Psychiatric/Behavioral: The patient is nervous/anxious.     Past Medical History:  Diagnosis Date  . Anxiety   . COPD (chronic obstructive pulmonary disease) (HCC)   . Depression   . Fatty liver 07/23/2018  . Fibromyalgia   . Hypertension   . Hypertension   . Manic depression (HCC)   . Neuropathy   . OSA (obstructive sleep apnea)     Past Surgical History:  Procedure Laterality Date  . ABDOMINAL SURGERY    . APPENDECTOMY    . BACK SURGERY    . BREAST REDUCTION SURGERY    . BREAST SURGERY    . CARPAL TUNNEL RELEASE Bilateral   . CESAREAN SECTION    . CHOLECYSTECTOMY    . HERNIA REPAIR    . JOINT REPLACEMENT    . KNEE SURGERY    . TONSILLECTOMY       reports that she quit smoking about 18 months ago. Her smoking use included cigarettes. She smoked 1.00 pack per day. She has never used smokeless tobacco. She reports that she does not drink alcohol or use drugs.  Allergies  Allergen Reactions  .  Ambien [Zolpidem Tartrate] Shortness Of Breath and Swelling    Tongue swelling, throat swelling.   . Beta Adrenergic Blockers Anaphylaxis  . Fish Allergy Shortness Of Breath and Rash  . Iodinated Diagnostic Agents Itching    Benadryl 50MG  prophylaxis before and after and patient reports she did fine  . Latex Itching and Other (See Comments)    Blisters skin  . Metoprolol Other (See Comments)    ANY MEDICATION THAT  ENDS IN -OLOL-   The patient is asthmatic.  Marland Kitchen Penicillins Shortness Of Breath, Rash and Other (See Comments)    Has patient had a PCN reaction causing immediate rash, facial/tongue/throat swelling, SOB or lightheadedness with hypotension: Yes Has patient had a PCN reaction causing severe rash involving mucus membranes or skin necrosis: Unknown Has patient had a PCN reaction that required hospitalization No Has patient had a PCN reaction occurring within the last 10 years: No If all of the above answers are "NO", then may proceed with Cephalosporin use.   . Red Dye Hives and Shortness Of Breath  . Shellfish Allergy Anaphylaxis  . Sulfa Antibiotics Hives and Shortness Of Breath  . Amoxicillin-Pot Clavulanate Nausea And Vomiting  . Aspirin Other (See Comments)    Blisters on tongue from higher doses - tolerates 81 mg   . Dye Fdc Red  [Amaranth (Fd&C Red #2)] Hives    X ray DYE  (BENADRYL 50 MG PROPHYLAXIS AND AFTER AND  SHE DID FINE, PER PATIENT.).  Marland Kitchen Tape Dermatitis and Rash    Adhesive   . Talwin [Pentazocine] Other (See Comments)    Unknown reaction  . Levofloxacin Rash and Other (See Comments)    headache  . Seroquel [Quetiapine Fumarate] Other (See Comments)    hallucinate    Family History  Problem Relation Age of Onset  . Alcohol abuse Mother   . Alcohol abuse Father   . Depression Sister   . Dementia Neg Hx     Prior to Admission medications   Medication Sig Start Date End Date Taking? Authorizing Provider  acyclovir ointment (ZOVIRAX) 5 % Apply 1 application topically every 3 (three) hours as needed (mouth sores).  01/27/18  Yes [provider]  albuterol (PROAIR HFA) 108 (90 Base) MCG/ACT inhaler Inhale 2 puffs into the lungs every 4 (four) hours as needed for wheezing. 06/30/17  Yes [provider]  amLODipine (NORVASC) 10 MG tablet Take 10 mg by mouth daily.  07/05/17  Yes [provider]  budesonide-formoterol (SYMBICORT) 160-4.5 MCG/ACT  inhaler Inhale 2 puffs into the lungs 2 (two) times daily.   Yes [provider]  buPROPion (WELLBUTRIN XL) 300 MG 24 hr tablet Take 1 tablet (300 mg total) by mouth daily. 01/09/18  Yes Thresa Ross, MD  cetirizine (ZYRTEC) 10 MG tablet Take 10 mg by mouth daily.   Yes [provider]  citalopram (CELEXA) 40 MG tablet Take 40 mg by mouth daily. 06/18/18  Yes [provider]  fluticasone (FLONASE) 50 MCG/ACT nasal spray Place 1 spray into both nostrils 2 (two) times daily. 05/31/18  Yes [provider]  gabapentin (NEURONTIN) 600 MG tablet Take 1,800 mg by mouth at bedtime.  05/03/18  Yes [provider]  hydrochlorothiazide (HYDRODIURIL) 25 MG tablet Take 25 mg by mouth daily. 04/03/18  Yes [provider]  hydrOXYzine (ATARAX/VISTARIL) 25 MG tablet Take 1 tablet (25 mg total) by mouth every 6 (six) hours as needed for anxiety (mild anxiety). Patient taking differently: Take 25 mg by  mouth every 6 (six) hours as needed for anxiety (panic attacks).  07/24/17  Yes Shaune Pollack, MD  Immune Globulin 10% (GAMMAGARD) 10 GM/100ML SOLN Inject into the vein every 30 (thirty) days.   Yes [provider]  ipratropium-albuterol (DUONEB) 0.5-2.5 (3) MG/3ML SOLN Inhale 3 mLs into the lungs every 4 (four) hours as needed for wheezing or shortness of breath. 04/15/17  Yes [provider]  montelukast (SINGULAIR) 10 MG tablet Take 10 mg by mouth at bedtime.  05/01/18  Yes [provider]  morphine (MS CONTIN) 30 MG 12 hr tablet Take 1 tablet (30 mg total) by mouth every 12 (twelve) hours. 04/28/17  Yes Narda Bonds, MD  NYSTATIN powder Apply 1 application topically 3 (three) times daily as needed (blistering sores under breast).  05/23/17  Yes [provider]  oxyCODONE-acetaminophen (PERCOCET) 10-325 MG tablet Take 1 tablet by mouth 4 (four) times daily. Hold for sedation    Yes [provider]  prazosin (MINIPRESS) 2 MG  capsule Take 2 mg by mouth at bedtime.  05/03/18  Yes [provider]  promethazine (PHENERGAN) 25 MG tablet Take 25 mg by mouth every 8 (eight) hours as needed for nausea/vomiting.  04/15/17  Yes [provider]  tiZANidine (ZANAFLEX) 4 MG tablet Take 4 mg by mouth 3 (three) times daily. 04/02/18  Yes [provider]  diazepam (VALIUM) 2 MG tablet Take 1 tablet (2 mg total) by mouth every 4 (four) hours as needed for muscle spasms. Patient not taking: Reported on 05/17/2018 03/02/18   Lorre Nick, MD  gabapentin (NEURONTIN) 100 MG capsule Take 2 capsules (200 mg total) by mouth 3 (three) times daily. Patient not taking: Reported on 07/13/2018 07/31/17 02/10/18  Thresa Ross, MD  QUEtiapine (SEROQUEL) 25 MG tablet Take 12.5-50 mg by mouth See admin instructions. Take 1/2 -1 tablet (12.5-25 mg) by mouth up to twice daily for anxiety and hallucinations, take 2 tablets (50 mg) at bedtime for sleep 08/06/18   [provider]  amphetamine-dextroamphetamine (ADDERALL) 30 MG tablet Take 30 mg by mouth 2 (two) times daily.  10/02/15  [provider]  FLUoxetine (PROZAC) 40 MG capsule Take 1 capsule (40 mg total) by mouth daily. 10/02/15 01/10/17  Thresa Ross, MD  pregabalin (LYRICA) 225 MG capsule Take 225 mg by mouth 2 (two) times daily.  01/10/17  [provider]    Physical Exam:  Constitutional: Overweight female who appears to be in moderate distress currently dry heaving. Vitals:   08/06/18 1757 08/06/18 2000 08/06/18 2130  BP: (!) 154/118 (!) 193/104 (!) 174/100  Pulse: (!) 106 (!) 105 (!) 104  Resp: 20  16  Temp: 98.2 F (36.8 C)    SpO2: 98% 96% 91%   Eyes: PERRL, lids and conjunctivae normal ENMT: Mucous membranes are dry posterior pharynx clear of any exudate or lesions.Normal dentition.  Neck: normal, supple, no masses, no thyromegaly Respiratory: clear to auscultation bilaterally, no wheezing, no crackles. Normal respiratory effort. No  accessory muscle use.  Cardiovascular: Mildly tachycardic, no murmurs / rubs / gallops. No extremity edema. 2+ pedal pulses. No carotid bruits.  Abdomen: Abdominal fullness present with tenderness palpation of the epigastric region.  Bowel sounds decreased. Musculoskeletal: no clubbing / cyanosis. No joint deformity upper and lower extremities. Good ROM, no contractures. Normal muscle tone.  Skin: no rashes, lesions, ulcers. No induration Neurologic: CN 2-12 grossly intact. Sensation intact, DTR normal. Strength 5/5 in all 4.  Psychiatric: Normal judgment and insight.  Alert and oriented x 3.  Anxious mood.     Labs on Admission: I have personally reviewed following labs and imaging studies  CBC: Recent Labs  Lab 08/06/18 1812  WBC 10.0  HGB 14.9  HCT 47.0*  MCV 86.4  PLT 504*   Basic Metabolic Panel: Recent Labs  Lab 08/06/18 1812  NA 140  K 3.4*  CL 98  CO2 29  GLUCOSE 123*  BUN 9  CREATININE 1.63*  CALCIUM 9.5   GFR: Estimated Creatinine Clearance: 46.2 mL/min (A) (by C-G formula based on SCr of 1.63 mg/dL (H)). Liver Function Tests: Recent Labs  Lab 08/06/18 1812  AST 29  ALT 30  ALKPHOS 95  BILITOT 0.7  PROT 7.6  ALBUMIN 3.8   Recent Labs  Lab 08/06/18 1812  LIPASE 31   No results for input(s): AMMONIA in the last 168 hours. Coagulation Profile: No results for input(s): INR, PROTIME in the last 168 hours. Cardiac Enzymes: No results for input(s): CKTOTAL, CKMB, CKMBINDEX, TROPONINI in the last 168 hours. BNP (last 3 results) No results for input(s): PROBNP in the last 8760 hours. HbA1C: No results for input(s): HGBA1C in the last 72 hours. CBG: No results for input(s): GLUCAP in the last 168 hours. Lipid Profile: No results for input(s): CHOL, HDL, LDLCALC, TRIG, CHOLHDL, LDLDIRECT in the last 72 hours. Thyroid Function Tests: No results for input(s): TSH, T4TOTAL, FREET4, T3FREE, THYROIDAB in the last 72 hours. Anemia Panel: No results for  input(s): VITAMINB12, FOLATE, FERRITIN, TIBC, IRON, RETICCTPCT in the last 72 hours. Urine analysis:    Component Value Date/Time   COLORURINE STRAW (A) 08/06/2018 2117   APPEARANCEUR CLEAR 08/06/2018 2117   LABSPEC 1.006 08/06/2018 2117   PHURINE 8.0 08/06/2018 2117   GLUCOSEU NEGATIVE 08/06/2018 2117   HGBUR NEGATIVE 08/06/2018 2117   BILIRUBINUR NEGATIVE 08/06/2018 2117   KETONESUR NEGATIVE 08/06/2018 2117   PROTEINUR NEGATIVE 08/06/2018 2117   NITRITE NEGATIVE 08/06/2018 2117   LEUKOCYTESUR NEGATIVE 08/06/2018 2117   Sepsis Labs: No results found for this or any previous visit (from the past 240 hour(s)).   Radiological Exams on Admission: Ct Abdomen Pelvis Wo Contrast  Result Date: 08/06/2018 CLINICAL DATA:  57 year old female with abdominal pain and distention. EXAM: CT ABDOMEN AND PELVIS WITHOUT CONTRAST TECHNIQUE: Multidetector CT imaging of the abdomen and pelvis was performed following the standard protocol without IV contrast. COMPARISON:  Abdominal radiograph dated 08/06/2018 and CT dated 07/23/2018. FINDINGS: Evaluation of this exam is limited in the absence of intravenous contrast. Lower chest: The visualized lung bases are clear. No intra-abdominal free air or free fluid. Hepatobiliary: There is diffuse fatty infiltration of the liver. No intrahepatic biliary ductal dilatation. Cholecystectomy. Pancreas: Unremarkable. No pancreatic ductal dilatation or surrounding inflammatory changes. Spleen: Normal in size without focal abnormality. Adrenals/Urinary Tract: The adrenal glands are unremarkable. The kidneys, visualized ureters, and urinary bladder appear unremarkable as well. Stomach/Bowel: There is large amount of stool throughout the colon mixed with oral contrast from prior study. There is no bowel obstruction or active inflammation. Appendectomy. Vascular/Lymphatic: Mild aortoiliac atherosclerotic disease. No portal venous gas. There is no adenopathy. Reproductive: The uterus  is anteverted. There is a 2 cm calcified fundal fibroid. The ovaries are grossly unremarkable as visualized. Other: Midline vertical anterior abdominal wall incisional scar. Musculoskeletal: Degenerative changes primarily at L5-S1. No acute osseous pathology. IMPRESSION: 1. No acute intra-abdominal or pelvic pathology. 2. Fatty liver. Electronically Signed   By: Elgie Collard M.D.   On: 08/06/2018  21:13   Dg Abdomen 1 View  Result Date: 08/06/2018 CLINICAL DATA:  History of small bowel obstruction. EXAM: ABDOMEN - 1 VIEW COMPARISON:  Abdominal radiograph July 23, 2018 and CT abdomen and pelvis July 23, 2018. FINDINGS: The bowel gas pattern is normal. Moderate amount of retained large bowel stool. Surgical clips in the included right abdomen compatible with cholecystectomy. No radio-opaque calculi or other significant radiographic abnormality are seen. Phleboliths project in the pelvis. IMPRESSION: 1. Moderate amount of retained large bowel stool. Nonobstructive bowel gas pattern. Electronically Signed   By: Awilda Metro M.D.   On: 08/06/2018 19:05    EKG: Independently reviewed.  Sinus rhythm at 98 bpm with QTc 502  Assessment/Plan Suspected constipation secondary to pain medication therapy, abdominal pain: Acute on chronic.  Patient presents with complaints of constipation for the last 14 days with abdominal pain.  Reportedly tried multiple modalities without relief. imaging studies showed large stool burden without signs of obstruction.  Patient with similar presentation where reduction in pain medication was recommended during last admission. - Admit to a telemetry bed - Monitor intake and output - Trial of smog enema - Senokot daily - Morphine IV prn pain overnight  Nausea and vomiting: Acute.  Patient with nausea and vomiting unable to take any significant amount of p.o. liquids at this time.  Initially given antiemetics of Zofran and Phenergan, but QTc noted to be prolonged at  502. - Avoiding QT prolonging medications at this time -Trial of Ativan IV prn anxiety/emesis - Advance diet as tolerated  Acute kidney injury: Baseline creatinine previously noted to be around 1-1.1, but patient presents with a creatinine of 1.63 with BUN 9.  Patient on a diuretic medication. - IV fluids at 125 mL/h - Hold nephrotoxic agents - Recheck creatine in a.m.  Hypertensive urgency: Acute.  Initial Blood pressures elevated up to 193/104. - Hold hydrochlorothiazide due to acute kidney injury - Continue amlodipine and prazosin - Hydralazine IV prn BP >180   Hypokalemia: Acute.  Initial potassium 3.4 on admission. - Give 30 mEq of potassium chloride IV - Continue to monitor and replace as needed  Prolonged QTc interval: Acute. initial QTc 502 on admission. - Hold QT prolonging medications - Correcting electrolyte abnormalities - Recheck EKG in a.m.  COPD: Patient without acute exacerbation at this time lungs appear grossly clear. - Restart breathing treatments when medically appropriate  Chronic pain/fibromyalgia: Chronic.  Patient on MS Contin and Percocet as needed.  Chronic pain use is the likely cause of the above. - Continue gabapentin - Adjust/restart pain medications when medically appropriate  Anxiety and depression - Continue Wellbutrin - Restart Celexa, Seroquel, and hydroxyzine prn once medically appropriate  Obstructive sleep apnea - CPAP q HS   IV immunoglobulin: Patient reportedly on IV immunoglobulin every month after her appendix surgery approximately 5 years ago.  DVT prophylaxis: lovenox Code Status: Full  Family Communication: No family present at bedside Disposition Plan: Discharge home once medically stable Consults called: none  Admission status:observation  Clydie Braun MD [facility]  Pager [phone]   If 7PM-7AM, please contact night-coverage [email or web address] Password [facility]  08/06/2018, 10:48 PM

## 2018-08-06 NOTE — ED Provider Notes (Signed)
Patient placed in Quick Look pathway, seen and evaluated   Chief Complaint: Abdominal Pain, Constipation, Vomiting   HPI:   57 y.o. F who presents for evaluation of constipation x14 days and abdominal pain, nausea/vomiting that has been ongoing.  Patient reports that she was recently admitted for small bowel obstruction discharged from the hospital on 07/24/2017.  She was on bowel rest during that time.  Patient reports that since her discharge from the hospital, she has had intermittent vomiting.  She reports sometimes she is able to take small bites of food difficulty but other times she has vomiting right after she eats.  Patient reports to have a bowel movement 2 weeks.  She states that they had told her she needed to follow-up with a stomach doctor but she has not seen one yet.  Patient reports that her abdomen feels tight and hurts up in the epigastric and left upper quadrant.  She states that she does not know if she has been passing flatus.  Patient denies any fevers, chest pain or difficulty breathing.  ROS: Abdominal Pain, Constipation, Vomitin  Physical Exam:   Gen: Appears uncomfortable   Neuro: Awake and Alert  Skin: Warm    Focused Exam: Decreased bowel sounds.  Tenderness palpation noted in the epigastric and bilateral upper quadrants.  Tearful throughout examination.   Initiation of care has begun. The patient has been counseled on the process, plan, and necessity for staying for the completion/evaluation, and the remainder of the medical screening examination    Maxwell CaulLayden, Corde Antonini A, PA-C 08/06/18 1810    Rolan BuccoBelfi, Melanie, MD 08/06/18 [phone]

## 2018-08-06 NOTE — ED Triage Notes (Signed)
Pt BIB [facility], c/o constipation x 14 days, also reports nausea/vomitig as well. Recent admission for SBO, d/c on 07/24/18, reports vomiting everyday since.

## 2018-08-06 NOTE — ED Notes (Signed)
MD Smith at bedside.

## 2018-08-06 NOTE — ED Provider Notes (Signed)
[facility]   CSN: [id number] Arrival date & time: 08/06/18  1757     History   Chief Complaint Chief Complaint  Patient presents with  . Constipation  . Emesis    HPI Deanna Yang is a 57 y.o. female.  Deanna Yang is a 57 y.o. Female with history of hypertension, COPD, fibromyalgia, manic depression and sleep apnea, who presents to the emergency department for evaluation of constipation, with generalized abdominal pain and persistent vomiting.  Patient reports she was recently hospitalized for a partial small bowel obstruction, discharged on 8/13.  Patient was on bowel rest during that time, and reports since her discharge from the hospital she has not had a bowel movement at all, and has had intermittent vomiting which is been becoming more persistent over the last few days, she has been unable to tolerate liquids or even small bites of food over the past few days.  Patient has tried 3 fleets enemas, has been taking stool softeners regularly, and has also tried mag citrate and has not been able to produce a bowel movement, she is unsure if she has been able to pass gas.  She has not had any fevers.  No urinary symptoms.  Patient reports she is on chronic narcotics, but has not had issues with constipation associated with this before.     Past Medical History:  Diagnosis Date  . Anxiety   . COPD (chronic obstructive pulmonary disease) (HCC)   . Depression   . Fatty liver 07/23/2018  . Fibromyalgia   . Hypertension   . Hypertension   . Manic depression (HCC)   . Neuropathy   . OSA (obstructive sleep apnea)     Patient Active Problem List   Diagnosis Date Noted  . Constipation due to pain medication therapy 08/06/2018  . Paralytic ileus (HCC) 07/24/2018  . AKI (acute kidney injury) (HCC)   . Fatty liver 07/23/2018  . Malnutrition of moderate degree 07/21/2018  . Hypertensive urgency 07/20/2018  . Nausea & vomiting  07/20/2018  . ARF (acute renal failure) (HCC) 07/19/2018  . Vocal cord dysfunction 04/25/2017  . PTSD (post-traumatic stress disorder) 04/25/2017  . 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 Acute respiratory failure with hypoxia (HCC) 04/22/2017  . Hypokalemia 04/22/2017  . Leukocytosis 04/22/2017  . Prolonged QT interval 04/22/2017  . CFIDS (chronic fatigue and immune dysfunction syndrome) (HCC) 08/20/2015  . Foot pain 08/20/2015  . Essential (primary) hypertension 08/20/2015  . COPD with acute exacerbation (HCC) 08/20/2015  . Obstructive apnea 08/20/2015  . Cervical osteoarthritis 08/20/2015  . Sleep disorder 08/20/2015  . Other specified postprocedural states 07/17/2015  . Borderline personality disorder (HCC) 06/11/2015    Past Surgical History:  Procedure Laterality Date  . ABDOMINAL SURGERY    . APPENDECTOMY    . BACK SURGERY    . BREAST REDUCTION SURGERY    . BREAST SURGERY    . CARPAL TUNNEL RELEASE Bilateral   . CESAREAN SECTION    . CHOLECYSTECTOMY    . HERNIA REPAIR    . JOINT REPLACEMENT    . KNEE SURGERY    . TONSILLECTOMY       OB History   None      Home Medications    Prior to Admission medications   Medication Sig Start Date End Date Taking? Authorizing Provider  acyclovir ointment (ZOVIRAX) 5 % Apply 1 application topically every 3 (three) hours as needed (mouth sores).  01/27/18  Yes  [provider]  albuterol (PROAIR HFA) 108 (90 Base) MCG/ACT inhaler Inhale 2 puffs into the lungs every 4 (four) hours as needed for wheezing. 06/30/17  Yes [provider]  amLODipine (NORVASC) 10 MG tablet Take 10 mg by mouth daily.  07/05/17  Yes [provider]  budesonide-formoterol (SYMBICORT) 160-4.5 MCG/ACT inhaler Inhale 2 puffs into the lungs 2 (two) times daily.   Yes [provider]  buPROPion (WELLBUTRIN XL) 300 MG 24 hr tablet Take 1 tablet (300 mg total) by mouth daily. 01/09/18  Yes Thresa Ross, MD  cetirizine  (ZYRTEC) 10 MG tablet Take 10 mg by mouth daily.   Yes [provider]  citalopram (CELEXA) 40 MG tablet Take 40 mg by mouth daily. 06/18/18  Yes [provider]  fluticasone (FLONASE) 50 MCG/ACT nasal spray Place 1 spray into both nostrils 2 (two) times daily. 05/31/18  Yes [provider]  gabapentin (NEURONTIN) 600 MG tablet Take 1,800 mg by mouth at bedtime.  05/03/18  Yes [provider]  hydrochlorothiazide (HYDRODIURIL) 25 MG tablet Take 25 mg by mouth daily. 04/03/18  Yes [provider]  hydrOXYzine (ATARAX/VISTARIL) 25 MG tablet Take 1 tablet (25 mg total) by mouth every 6 (six) hours as needed for anxiety (mild anxiety). Patient taking differently: Take 25 mg by mouth every 6 (six) hours as needed for anxiety (panic attacks).  07/24/17  Yes Shaune Pollack, MD  Immune Globulin 10% (GAMMAGARD) 10 GM/100ML SOLN Inject into the vein every 30 (thirty) days.   Yes [provider]  ipratropium-albuterol (DUONEB) 0.5-2.5 (3) MG/3ML SOLN Inhale 3 mLs into the lungs every 4 (four) hours as needed for wheezing or shortness of breath. 04/15/17  Yes [provider]  montelukast (SINGULAIR) 10 MG tablet Take 10 mg by mouth at bedtime.  05/01/18  Yes [provider]  morphine (MS CONTIN) 30 MG 12 hr tablet Take 1 tablet (30 mg total) by mouth every 12 (twelve) hours. 04/28/17  Yes Narda Bonds, MD  NYSTATIN powder Apply 1 application topically 3 (three) times daily as needed (blistering sores under breast).  05/23/17  Yes [provider]  oxyCODONE-acetaminophen (PERCOCET) 10-325 MG tablet Take 1 tablet by mouth 4 (four) times daily. Hold for sedation    Yes [provider]  prazosin (MINIPRESS) 2 MG capsule Take 2 mg by mouth at bedtime.  05/03/18  Yes [provider]  promethazine (PHENERGAN) 25 MG tablet Take 25 mg by mouth every 8 (eight) hours as needed for nausea/vomiting.  04/15/17  Yes [provider]    tiZANidine (ZANAFLEX) 4 MG tablet Take 4 mg by mouth 3 (three) times daily. 04/02/18  Yes [provider]  diazepam (VALIUM) 2 MG tablet Take 1 tablet (2 mg total) by mouth every 4 (four) hours as needed for muscle spasms. Patient not taking: Reported on 05/17/2018 03/02/18   Lorre Nick, MD  gabapentin (NEURONTIN) 100 MG capsule Take 2 capsules (200 mg total) by mouth 3 (three) times daily. Patient not taking: Reported on 07/13/2018 07/31/17 02/10/18  Thresa Ross, MD  QUEtiapine (SEROQUEL) 25 MG tablet Take 12.5-50 mg by mouth See admin instructions. Take 1/2 -1 tablet (12.5-25 mg) by mouth up to twice daily for anxiety and hallucinations, take 2 tablets (50 mg) at bedtime for sleep 08/06/18   [provider]  amphetamine-dextroamphetamine (ADDERALL) 30 MG tablet Take 30 mg by mouth 2 (two) times daily.  10/02/15  [provider]  FLUoxetine (PROZAC) 40 MG capsule  Take 1 capsule (40 mg total) by mouth daily. 10/02/15 01/10/17  Thresa Ross, MD  pregabalin (LYRICA) 225 MG capsule Take 225 mg by mouth 2 (two) times daily.  01/10/17  [provider]    Family History Family History  Problem Relation Age of Onset  . Alcohol abuse Mother   . Alcohol abuse Father   . Depression Sister   . Dementia Neg Hx     Social History Social History   Tobacco Use  . Smoking status: Former Smoker    Packs/day: 1.00    Types: Cigarettes    Last attempt to quit: 01/17/2017    Years since quitting: 1.5  . Smokeless tobacco: Never Used  Substance Use Topics  . Alcohol use: No    Comment: last use 2-3 years ago  . Drug use: No     Allergies   Ambien [zolpidem tartrate]; Beta adrenergic blockers; Fish allergy; Iodinated diagnostic agents; Latex; Metoprolol; Penicillins; Red dye; Shellfish allergy; Sulfa antibiotics; Amoxicillin-pot clavulanate; Aspirin; Dye fdc red  [amaranth (fd&c red #2)]; Tape; Talwin [pentazocine]; Levofloxacin; and Seroquel [quetiapine  fumarate]   Review of Systems Review of Systems  Constitutional: Positive for appetite change. Negative for chills and fever.  HENT: Negative.   Eyes: Negative for visual disturbance.  Respiratory: Negative for cough and shortness of breath.   Cardiovascular: Negative for chest pain.  Gastrointestinal: Positive for abdominal distention, abdominal pain, constipation, nausea and vomiting.  Genitourinary: Negative for dysuria and frequency.  Musculoskeletal: Negative for arthralgias and myalgias.  Skin: Negative for color change and rash.  Neurological: Negative for dizziness, syncope and Iyer-headedness.     Physical Exam Updated Vital Signs BP (!) 154/118 (BP Location: Right Arm)   Pulse (!) 106   Temp 98.2 F (36.8 C)   Resp 20   SpO2 98%   Physical Exam  Constitutional: She is oriented to person, place, and time. She appears well-developed and well-nourished. No distress.  Appears uncomfortable, actively vomiting, but not in acute distress  HENT:  Head: Normocephalic and atraumatic.  Mouth/Throat: Oropharynx is clear and moist.  Eyes: Pupils are equal, round, and reactive to Heft. EOM are normal. Right eye exhibits no discharge. Left eye exhibits no discharge.  Neck: Neck supple.  Cardiovascular: Regular rhythm, normal heart sounds and intact distal pulses.  Mild tacycardia  Pulmonary/Chest: Effort normal and breath sounds normal. No respiratory distress.  Respirations equal and unlabored, patient able to speak in full sentences, lungs clear to auscultation bilaterally  Abdominal: Soft. Bowel sounds are normal. She exhibits distension. She exhibits no mass. There is tenderness. There is guarding. There is no rebound.  Abdomen soft with mild distention, hypoactive bowel sounds throughout, generalized tenderness to palpation with minimal guarding and no rebound tenderness, no CVA tenderness  Genitourinary:  Genitourinary Comments: Digital Rectal Exam reveals sphincter with  good tone. No external hemorrhoids. No masses or fissures. No palpable impaction or removable stool  Musculoskeletal: She exhibits no edema or deformity.  Neurological: She is alert and oriented to person, place, and time. Coordination normal.  Skin: Skin is warm and dry. Capillary refill takes less than 2 seconds. She is not diaphoretic.  Psychiatric: She has a normal mood and affect. Her behavior is normal.  Nursing note and vitals reviewed.    ED Treatments / Results  Labs (all labs ordered are listed, but only abnormal results are displayed) Labs Reviewed  COMPREHENSIVE METABOLIC PANEL - Abnormal; Notable for the following components:      Result  Value   Potassium 3.4 (*)    Glucose, Bld 123 (*)    Creatinine, Ser 1.63 (*)    GFR calc non Af Amer 34 (*)    GFR calc Af Amer 40 (*)    All other components within normal limits  CBC - Abnormal; Notable for the following components:   RBC 5.44 (*)    HCT 47.0 (*)    Platelets 504 (*)    All other components within normal limits  URINALYSIS, ROUTINE W REFLEX MICROSCOPIC - Abnormal; Notable for the following components:   Color, Urine STRAW (*)    All other components within normal limits  LIPASE, BLOOD  CBC  BASIC METABOLIC PANEL  I-STAT BETA HCG BLOOD, ED ([facility], [facility], [facility] ONLY)    EKG EKG Interpretation  Date/Time:  Monday August 06 2018 20:11:51 EDT Ventricular Rate:  98 PR Interval:    QRS Duration: 100 QT Interval:  393 QTC Calculation: 502 R Axis:   41 Text Interpretation:  Sinus rhythm Borderline prolonged QT interval No old tracing to compare Confirmed by Linwood Dibbles [phone]) on 08/06/2018 8:33:41 PM   Radiology Ct Abdomen Pelvis Wo Contrast  Result Date: 08/06/2018 CLINICAL DATA:  57 year old female with abdominal pain and distention. EXAM: CT ABDOMEN AND PELVIS WITHOUT CONTRAST TECHNIQUE: Multidetector CT imaging of the abdomen and pelvis was performed following the standard protocol without IV contrast. COMPARISON:   Abdominal radiograph dated 08/06/2018 and CT dated 07/23/2018. FINDINGS: Evaluation of this exam is limited in the absence of intravenous contrast. Lower chest: The visualized lung bases are clear. No intra-abdominal free air or free fluid. Hepatobiliary: There is diffuse fatty infiltration of the liver. No intrahepatic biliary ductal dilatation. Cholecystectomy. Pancreas: Unremarkable. No pancreatic ductal dilatation or surrounding inflammatory changes. Spleen: Normal in size without focal abnormality. Adrenals/Urinary Tract: The adrenal glands are unremarkable. The kidneys, visualized ureters, and urinary bladder appear unremarkable as well. Stomach/Bowel: There is large amount of stool throughout the colon mixed with oral contrast from prior study. There is no bowel obstruction or active inflammation. Appendectomy. Vascular/Lymphatic: Mild aortoiliac atherosclerotic disease. No portal venous gas. There is no adenopathy. Reproductive: The uterus is anteverted. There is a 2 cm calcified fundal fibroid. The ovaries are grossly unremarkable as visualized. Other: Midline vertical anterior abdominal wall incisional scar. Musculoskeletal: Degenerative changes primarily at L5-S1. No acute osseous pathology. IMPRESSION: 1. No acute intra-abdominal or pelvic pathology. 2. Fatty liver. Electronically Signed   By: Elgie Collard M.D.   On: 08/06/2018 21:13   Dg Abdomen 1 View  Result Date: 08/06/2018 CLINICAL DATA:  History of small bowel obstruction. EXAM: ABDOMEN - 1 VIEW COMPARISON:  Abdominal radiograph July 23, 2018 and CT abdomen and pelvis July 23, 2018. FINDINGS: The bowel gas pattern is normal. Moderate amount of retained large bowel stool. Surgical clips in the included right abdomen compatible with cholecystectomy. No radio-opaque calculi or other significant radiographic abnormality are seen. Phleboliths project in the pelvis. IMPRESSION: 1. Moderate amount of retained large bowel stool. Nonobstructive  bowel gas pattern. Electronically Signed   By: Awilda Metro M.D.   On: 08/06/2018 19:05    Procedures Procedures (including critical care time)  Medications Ordered in ED Medications  ondansetron (ZOFRAN-ODT) disintegrating tablet 4 mg (4 mg Oral Given 08/06/18 1810)  sodium chloride 0.9 % bolus 1,000 mL (0 mLs Intravenous Stopped 08/06/18 2223)  morphine 4 MG/ML injection 4 mg (4 mg Intravenous Given 08/06/18 2018)  promethazine (PHENERGAN) injection 25 mg (25 mg Intravenous Given  08/06/18 2115)     Initial Impression / Assessment and Plan / ED Course  I have reviewed the triage vital signs and the nursing notes.  Pertinent labs & imaging results that were available during my care of the patient were reviewed by me and considered in my medical decision making (see chart for details).  Presents for evaluation of constipation, generalized abdominal pain and persistent vomiting.  Was recently admitted to the hospital with partial bowel obstruction, and reports since discharge she has not been able to pass any stools, and has had worsening abdominal pain and persistent vomiting, unable to keep down fluids or small bites of food.  Afebrile, mildly tachycardic and hypertensive on arrival.  Abdomen is mildly distended, bowel sounds are present although hypoactive throughout, abdomen with mild diffuse tenderness.  Rectal exam without palpable impaction or easily movable stool.  Abdominal labs ordered from triage and overall reassuring, no leukocytosis and normal hemoglobin, mild hypokalemia of 3.4, no other acute electrolyte derangements.  Creatinine is increased from baseline at 1.63, normal LFTs and normal lipase.  Urinalysis without signs of infection and negative pregnancy.  KUB shows large stool burden with normal bowel gas pattern.  Given persistent pain and vomiting will get CT abdomen pelvis without contrast given allergy to assess for any obstruction.  CT shows no acute intra-abdominal  pathology, no evidence of obstruction or transition point and no dilated loops of bowel.  Reports improvement in pain, but despite Zofran and Phenergan patient continues to have intractable nausea and vomiting patient has known history of QT prolongation and has borderline prolonged QT on EKG today limiting options for further antiemetics.  Given intractable vomiting and mild AKI and persistent constipation feel patient will require observational admission for continued rehydration, control of vomiting and treatment with bowel regimen.  Consult placed for medicine admission.  Discussed with Dr. Madelyn Flavors with [facility] hospitalist who will see and admit the patient.  Final Clinical Impressions(s) / ED Diagnoses   Final diagnoses:  Intractable vomiting with nausea, unspecified vomiting type  Generalized abdominal pain  Constipation, unspecified constipation type  Prolonged QT interval  AKI (acute kidney injury) [facility])    ED Discharge Orders    None       Legrand Rams 08/07/18 0009    Linwood Dibbles, MD 08/08/18 1123

## 2018-08-07 ENCOUNTER — Institutional Professional Consult (permissible substitution): Payer: Medicare Other | Admitting: Internal Medicine

## 2018-08-07 ENCOUNTER — Other Ambulatory Visit: Payer: Self-pay

## 2018-08-07 DIAGNOSIS — R1084 Generalized abdominal pain: Secondary | ICD-10-CM | POA: Diagnosis not present

## 2018-08-07 DIAGNOSIS — K5900 Constipation, unspecified: Secondary | ICD-10-CM | POA: Diagnosis not present

## 2018-08-07 DIAGNOSIS — K5903 Drug induced constipation: Secondary | ICD-10-CM | POA: Diagnosis not present

## 2018-08-07 DIAGNOSIS — R112 Nausea with vomiting, unspecified: Secondary | ICD-10-CM | POA: Diagnosis not present

## 2018-08-07 DIAGNOSIS — G4733 Obstructive sleep apnea (adult) (pediatric): Secondary | ICD-10-CM | POA: Diagnosis not present

## 2018-08-07 DIAGNOSIS — R000 Tachycardia, unspecified: Secondary | ICD-10-CM | POA: Diagnosis present

## 2018-08-07 DIAGNOSIS — R9431 Abnormal electrocardiogram [ECG] [EKG]: Secondary | ICD-10-CM | POA: Diagnosis not present

## 2018-08-07 DIAGNOSIS — T402X5A Adverse effect of other opioids, initial encounter: Secondary | ICD-10-CM | POA: Diagnosis not present

## 2018-08-07 LAB — BASIC METABOLIC PANEL
ANION GAP: 11 (ref 5–15)
BUN: 7 mg/dL (ref 6–20)
CO2: 28 mmol/L (ref 22–32)
Calcium: 9 mg/dL (ref 8.9–10.3)
Chloride: 102 mmol/L (ref 98–111)
Creatinine, Ser: 1.29 mg/dL — ABNORMAL HIGH (ref 0.44–1.00)
GFR calc Af Amer: 53 mL/min — ABNORMAL LOW (ref >60)
GFR calc non Af Amer: 46 mL/min — ABNORMAL LOW (ref >60)
GLUCOSE: 125 mg/dL — AB (ref 70–99)
POTASSIUM: 3.4 mmol/L — AB (ref 3.5–5.1)
Sodium: 141 mmol/L (ref 135–145)

## 2018-08-07 LAB — CBC
HEMATOCRIT: 43.9 % (ref 36.0–46.0)
Hemoglobin: 14.2 g/dL (ref 12.0–15.0)
MCH: 27.8 pg (ref 26.0–34.0)
MCHC: 32.3 g/dL (ref 30.0–36.0)
MCV: 86.1 fL (ref 78.0–100.0)
Platelets: 472 10*3/uL — ABNORMAL HIGH (ref 150–400)
RBC: 5.1 MIL/uL (ref 3.87–5.11)
RDW: 13.3 % (ref 11.5–15.5)
WBC: 8.5 10*3/uL (ref 4.0–10.5)

## 2018-08-07 LAB — MAGNESIUM: Magnesium: 1.7 mg/dL (ref 1.7–2.4)

## 2018-08-07 MED ORDER — ADULT MULTIVITAMIN LIQUID CH
15.0000 mL | Freq: Every day | ORAL | Status: DC
  Administered 2018-08-07: 15 mL via ORAL
  Filled 2018-08-07: qty 15

## 2018-08-07 MED ORDER — ADULT MULTIVITAMIN W/MINERALS CH
1.0000 | ORAL_TABLET | Freq: Every day | ORAL | Status: DC
  Administered 2018-08-08 – 2018-08-09 (×2): 1 via ORAL
  Filled 2018-08-07 (×2): qty 1

## 2018-08-07 MED ORDER — BOOST / RESOURCE BREEZE PO LIQD CUSTOM
1.0000 | Freq: Three times a day (TID) | ORAL | Status: DC
  Administered 2018-08-08 (×2): 1 via ORAL

## 2018-08-07 MED ORDER — HYDRALAZINE HCL 20 MG/ML IJ SOLN
5.0000 mg | Freq: Four times a day (QID) | INTRAMUSCULAR | Status: DC | PRN
  Administered 2018-08-07 – 2018-08-09 (×3): 10 mg via INTRAVENOUS
  Filled 2018-08-07 (×3): qty 1

## 2018-08-07 MED ORDER — MORPHINE SULFATE ER 15 MG PO TBCR
30.0000 mg | EXTENDED_RELEASE_TABLET | Freq: Two times a day (BID) | ORAL | Status: DC
  Administered 2018-08-07 – 2018-08-09 (×5): 30 mg via ORAL
  Filled 2018-08-07 (×5): qty 2

## 2018-08-07 MED ORDER — POTASSIUM CHLORIDE 20 MEQ PO PACK
40.0000 meq | PACK | Freq: Once | ORAL | Status: AC
  Administered 2018-08-07: 40 meq via ORAL
  Filled 2018-08-07: qty 2

## 2018-08-07 MED ORDER — SODIUM CHLORIDE 0.9 % IV SOLN
INTRAVENOUS | Status: DC
  Administered 2018-08-07 – 2018-08-08 (×2): via INTRAVENOUS

## 2018-08-07 MED ORDER — LORAZEPAM 2 MG/ML IJ SOLN
0.5000 mg | INTRAMUSCULAR | Status: DC | PRN
  Administered 2018-08-07 – 2018-08-09 (×7): 0.5 mg via INTRAVENOUS
  Filled 2018-08-07 (×7): qty 1

## 2018-08-07 NOTE — Progress Notes (Signed)
RT put CPAP in patient's room but did not put on patient at this time due to patient vomiting, and concern for aspiration.  Patient stated that she would just wear 3L [place] at this time. CPAP is at patient's bedside. RT will monitor as needed.

## 2018-08-07 NOTE — Progress Notes (Signed)
Initial Nutrition Assessment  DOCUMENTATION CODES:   Obesity unspecified  INTERVENTION:   -Boost Breeze po TID, each supplement provides 250 kcal and 9 grams of protein -15 ml liquid MVI daily  NUTRITION DIAGNOSIS:   Inadequate oral intake related to altered GI function, nausea as evidenced by meal completion < 25%.  GOAL:   Patient will meet greater than or equal to 90% of their needs  MONITOR:   PO intake, Supplement acceptance, Diet advancement, Labs, Weight trends, Skin, I & O's  REASON FOR ASSESSMENT:   Malnutrition Screening Tool    ASSESSMENT:   Deanna Yang is a 57 y.o. female with medical history significant of HTN, chronic pain, depression, sleep apnea; who presents with complaints of constipation  Pt admitted with suspected constipation.   Attempted to see pt x 2, however, pt either receiving nursing care of actively dry heaving at times of visits.   Pt is currently on a clear liquid diet, however, suspect eating very little. Pt has experienced a 7.7% wt loss over the past 3 months, which is significant for time frame, however, suspect some wt loss may be related to dehydration.   Per previous RD note, pt has had poor oral intake for approximately 1 month, consuming mainly fruit, jello, and popsicles.   Labs reviewed: K: 3.4.   NUTRITION - FOCUSED PHYSICAL EXAM:    Most Recent Value  Orbital Region  Unable to assess  Upper Arm Region  Unable to assess  Thoracic and Lumbar Region  Unable to assess  Buccal Region  Unable to assess  Temple Region  Unable to assess  Clavicle Bone Region  Unable to assess  Clavicle and Acromion Bone Region  Unable to assess  Scapular Bone Region  Unable to assess  Dorsal Hand  Unable to assess  Patellar Region  Unable to assess  Anterior Thigh Region  Unable to assess  Posterior Calf Region  Unable to assess  Edema (RD Assessment)  Unable to assess  Hair  Unable to assess  Eyes  Unable to assess  Mouth  Unable to  assess  Skin  Unable to assess  Nails  Unable to assess       Diet Order:   Diet Order            Diet clear liquid Room service appropriate? Yes; Fluid consistency: Thin  Diet effective now              EDUCATION NEEDS:   Not appropriate for education at this time  Skin:  Skin Assessment: Reviewed RN Assessment  Last BM:  08/07/18  Height:   Ht Readings from Last 1 Encounters:  08/07/18 5\' 6"  (1.676 m)    Weight:   Wt Readings from Last 1 Encounters:  08/07/18 98.4 kg    Ideal Body Weight:  59.1 kg  BMI:  Body mass index is 35.02 kg/m.  Estimated Nutritional Needs:   Kcal:  1750-1950  Protein:  85-100 grams  Fluid:  1.7-1.9 L    Marylynn Rigdon A. Mayford KnifeWilliams, RD, LDN, CDE Pager: [phone] After hours Pager: [phone]

## 2018-08-07 NOTE — Progress Notes (Signed)
Patient states she is nauseated and may not be able to wear CPAP tonight.  RT will follow up with patient later.

## 2018-08-07 NOTE — Progress Notes (Signed)
Patient is refusing the use of CPAP for tonight. Patient states she is nauseas. RT informed patient if she changes her mind have RN contact RT.

## 2018-08-07 NOTE — Progress Notes (Signed)
PROGRESS NOTE  Deanna Yang WUJ:[id number] DOB: July 11, 1961 DOA: 08/06/2018 PCP: Eartha Inch, MD  HPI/Brief Narrative  Deanna Yang is a 57 y.o. year old female with medical history significant for HTN, chronic pain on narcotics, fibromyalgia, COPD on 3.5L [place] O2 depression, OSA who presented on 08/06/2018 with vomiting and abdominal pain in setting of no BM over the past 2 weeks. Was recently discharged on 07/24/18 for ileus related to opioid regimen that improved with NGT and optimized bowel regimen. Since recent discharge has been eating a liquid diet because of persistent nausea of abdominal pain. She tried eating a cheeseburger on 08/06/18 and had several episodes of emesis. She confirms she has not had a bowel movement since the last discharge on 07/24/18. She confirms taking MS contin twice daily and percocet 4 times daily, she's had multiple surgeries and is treated for chronic pain.   In the ED patient was medically stable, initial SPO2 88%, heart rate 102-106, blood pressure range 154/118-193/104.  UA was negative, Lipase 31, CMP notable for creatinine of 1.63 (baseline 1-1 0.14) CBC unremarkable.  X-ray show moderate amount of retained large bowel stool with nonobstructive bowel gas pattern.  CT abdomen showed no acute abdominal or large amount of stool throughout the colon no signs of bowel obstruction or active inflammation.  Patient received IV morphine 4 mg x 1 and Zofran before [facility]  Subjective She is still having abdominal pain and is nauseous.  It has improved with bowel movements  Assessment/Plan:  Nausea and vomiting secondary to significant constipation without signs of bowel obstruction, improving.Secondary to opioid regimen and has not been on optimize bowel regimen since recent discharge.  Has had some stool overnight.  Optimize bowel regimen.  Monitor.  Antiemetics PRN.  AKI, secondary to emesis, improving.  Prerenal related to  hypovolemia (emesis, decreased p.o. intake).  Baseline creatinine 1- 1.1.63 on admission, repeat 1.29 (8/27).  Monitor BMP a.m.  Sinus Tachycardia. Suspect related to volume loss due to above. Continue timed IVF and monitor  Hypertensive urgency.  Blood pressure elevated to 193/104.  Has improved with initiation of home amlodipine and prazosin but went back up again on afternoon of 8/27. Likely worsened in setting of pain.  Currently holding HCTZ as monitoring recovering kidney function.  IV hydralazine PRN SBP is greater than 170.  Hypokalemia, .  Related to emesis. Mag wnl. Status post IV supplementation.  Give oral repletion  Prolonged QTc interval.  Initial QTC on admission 502.  Fortunately, repeat EKG on 8/27 improved. Avoiding traditional antiemetics for SL ativan to avoid QTC prolonging agents.  Chronic pain/fibromyalgia, stable.  Continue home MS Contin 30 mg BID, percocet q6 HPRN(scheduled at home), gabapentin.  COPD, stable.  Reportedly on oxygen at home, doing well on room air here. No wheezing or other signs of respiratory distress.  Monitor.  Anxiety depression, stable.  Continue home Wellbutrin.  Currently holding home Celexa and Seroquel in the setting of prolonged QTC will reinitiate.  OSA, stable.  CPAP nightly.   Code Status: FULL CODE   Family Communication: no family at bedside   Disposition Plan: optimize bowel regimen, correct K, BP control    Consultants:  none     Procedures:  none   Antimicrobials: Anti-infectives (From admission, onward)   None         Cultures:  no  Telemetry:no  DVT prophylaxis: Lovenox   Objective: Vitals:   08/06/18 2345 08/07/18 0010 08/07/18 0142 08/07/18 0440  BP: (!) 163/92 (!) 181/93 (!) 177/92 (!) 166/92  Pulse: (!) 102 (!) 104  (!) 114  Resp: (!) 21 18  20   Temp:  98.5 F (36.9 C)  98.6 F (37 C)  TempSrc:  Oral  Oral  SpO2: (!) 88% 97%  94%  Weight:  98.4 kg    Height:  5\' 6"  (1.676 m)       Intake/Output Summary (Last 24 hours) at 08/07/2018 1140 Last data filed at 08/07/2018 4098 Gross per 24 hour  Intake 1913.26 ml  Output 251 ml  Net 1662.26 ml   Filed Weights   08/07/18 0010  Weight: 98.4 kg    Exam:  Constitutional:normal appearing female Eyes: EOMI, anicteric, normal conjunctivae ENMT: Oropharynx with dry mucous membranes, normal dentition Neck: FROM, no thyromegaly Cardiovascular: RRR no MRGs, with no peripheral edema Respiratory: Normal respiratory effort on room air, clear breath sounds  Abdomen: Soft,some tenderness with palpation, no rebound or guarding, hyperactive bowel sounds Skin: No rash ulcers, or lesions. Without skin tenting  Neurologic: Grossly no focal neuro deficit. Psychiatric:Appropriate affect, and mood. Mental status AAOx3  Data Reviewed: CBC: Recent Labs  Lab 08/06/18 1812 08/07/18 0651  WBC 10.0 8.5  HGB 14.9 14.2  HCT 47.0* 43.9  MCV 86.4 86.1  PLT 504* 472*   Basic Metabolic Panel: Recent Labs  Lab 08/06/18 1812 08/07/18 0651  NA 140 141  K 3.4* 3.4*  CL 98 102  CO2 29 28  GLUCOSE 123* 125*  BUN 9 7  CREATININE 1.63* 1.29*  CALCIUM 9.5 9.0  MG  --  1.7   GFR: Estimated Creatinine Clearance: 58.3 mL/min (A) (by C-G formula based on SCr of 1.29 mg/dL (H)). Liver Function Tests: Recent Labs  Lab 08/06/18 1812  AST 29  ALT 30  ALKPHOS 95  BILITOT 0.7  PROT 7.6  ALBUMIN 3.8   Recent Labs  Lab 08/06/18 1812  LIPASE 31   No results for input(s): AMMONIA in the last 168 hours. Coagulation Profile: No results for input(s): INR, PROTIME in the last 168 hours. Cardiac Enzymes: No results for input(s): CKTOTAL, CKMB, CKMBINDEX, TROPONINI in the last 168 hours. BNP (last 3 results) No results for input(s): PROBNP in the last 8760 hours. HbA1C: No results for input(s): HGBA1C in the last 72 hours. CBG: No results for input(s): GLUCAP in the last 168 hours. Lipid Profile: No results for input(s): CHOL,  HDL, LDLCALC, TRIG, CHOLHDL, LDLDIRECT in the last 72 hours. Thyroid Function Tests: No results for input(s): TSH, T4TOTAL, FREET4, T3FREE, THYROIDAB in the last 72 hours. Anemia Panel: No results for input(s): VITAMINB12, FOLATE, FERRITIN, TIBC, IRON, RETICCTPCT in the last 72 hours. Urine analysis:    Component Value Date/Time   COLORURINE STRAW (A) 08/06/2018 2117   APPEARANCEUR CLEAR 08/06/2018 2117   LABSPEC 1.006 08/06/2018 2117   PHURINE 8.0 08/06/2018 2117   GLUCOSEU NEGATIVE 08/06/2018 2117   HGBUR NEGATIVE 08/06/2018 2117   BILIRUBINUR NEGATIVE 08/06/2018 2117   KETONESUR NEGATIVE 08/06/2018 2117   PROTEINUR NEGATIVE 08/06/2018 2117   NITRITE NEGATIVE 08/06/2018 2117   LEUKOCYTESUR NEGATIVE 08/06/2018 2117   Sepsis Labs: @LABRCNTIP (procalcitonin:4,lacticidven:4)  )No results found for this or any previous visit (from the past 240 hour(s)).    Studies: Ct Abdomen Pelvis Wo Contrast  Result Date: 08/06/2018 CLINICAL DATA:  57 year old female with abdominal pain and distention. EXAM: CT ABDOMEN AND PELVIS WITHOUT CONTRAST TECHNIQUE: Multidetector CT imaging of the abdomen and pelvis was performed following the standard protocol without  IV contrast. COMPARISON:  Abdominal radiograph dated 08/06/2018 and CT dated 07/23/2018. FINDINGS: Evaluation of this exam is limited in the absence of intravenous contrast. Lower chest: The visualized lung bases are clear. No intra-abdominal free air or free fluid. Hepatobiliary: There is diffuse fatty infiltration of the liver. No intrahepatic biliary ductal dilatation. Cholecystectomy. Pancreas: Unremarkable. No pancreatic ductal dilatation or surrounding inflammatory changes. Spleen: Normal in size without focal abnormality. Adrenals/Urinary Tract: The adrenal glands are unremarkable. The kidneys, visualized ureters, and urinary bladder appear unremarkable as well. Stomach/Bowel: There is large amount of stool throughout the colon mixed with  oral contrast from prior study. There is no bowel obstruction or active inflammation. Appendectomy. Vascular/Lymphatic: Mild aortoiliac atherosclerotic disease. No portal venous gas. There is no adenopathy. Reproductive: The uterus is anteverted. There is a 2 cm calcified fundal fibroid. The ovaries are grossly unremarkable as visualized. Other: Midline vertical anterior abdominal wall incisional scar. Musculoskeletal: Degenerative changes primarily at L5-S1. No acute osseous pathology. IMPRESSION: 1. No acute intra-abdominal or pelvic pathology. 2. Fatty liver. Electronically Signed   By: Elgie CollardArash  Radparvar M.D.   On: 08/06/2018 21:13   Dg Abdomen 1 View  Result Date: 08/06/2018 CLINICAL DATA:  History of small bowel obstruction. EXAM: ABDOMEN - 1 VIEW COMPARISON:  Abdominal radiograph July 23, 2018 and CT abdomen and pelvis July 23, 2018. FINDINGS: The bowel gas pattern is normal. Moderate amount of retained large bowel stool. Surgical clips in the included right abdomen compatible with cholecystectomy. No radio-opaque calculi or other significant radiographic abnormality are seen. Phleboliths project in the pelvis. IMPRESSION: 1. Moderate amount of retained large bowel stool. Nonobstructive bowel gas pattern. Electronically Signed   By: Awilda Metroourtnay  Bloomer M.D.   On: 08/06/2018 19:05    Scheduled Meds: . amLODipine  10 mg Oral Daily  . buPROPion  300 mg Oral Daily  . enoxaparin (LOVENOX) injection  40 mg Subcutaneous QHS  . gabapentin  1,800 mg Oral QHS  . montelukast  10 mg Oral QHS  . prazosin  2 mg Oral QHS  . senna  1 tablet Oral BID  . sodium chloride flush  3 mL Intravenous Q12H    Continuous Infusions: . sodium chloride 125 mL/hr at 08/07/18 0828     LOS: 0 days     Laverna PeaceShayla D Kaniesha Barile, MD [facility] Pager [phone]  If 7PM-7AM, please contact night-coverage [email or web address] Password [email or web address] 08/07/2018, 11:40 AM

## 2018-08-08 DIAGNOSIS — K5903 Drug induced constipation: Secondary | ICD-10-CM

## 2018-08-08 DIAGNOSIS — R9431 Abnormal electrocardiogram [ECG] [EKG]: Secondary | ICD-10-CM | POA: Diagnosis not present

## 2018-08-08 DIAGNOSIS — G4733 Obstructive sleep apnea (adult) (pediatric): Secondary | ICD-10-CM | POA: Diagnosis not present

## 2018-08-08 DIAGNOSIS — N179 Acute kidney failure, unspecified: Secondary | ICD-10-CM

## 2018-08-08 LAB — BASIC METABOLIC PANEL
Anion gap: 11 (ref 5–15)
BUN: <5 mg/dL — ABNORMAL LOW (ref 6–20)
CALCIUM: 8.6 mg/dL — AB (ref 8.9–10.3)
CO2: 24 mmol/L (ref 22–32)
Chloride: 105 mmol/L (ref 98–111)
Creatinine, Ser: 1.04 mg/dL — ABNORMAL HIGH (ref 0.44–1.00)
GFR calc Af Amer: >60 mL/min (ref >60)
GFR, EST NON AFRICAN AMERICAN: 59 mL/min — AB (ref >60)
GLUCOSE: 130 mg/dL — AB (ref 70–99)
Potassium: 3.1 mmol/L — ABNORMAL LOW (ref 3.5–5.1)
Sodium: 140 mmol/L (ref 135–145)

## 2018-08-08 MED ORDER — ALBUTEROL SULFATE (2.5 MG/3ML) 0.083% IN NEBU: 3.0000 mL | INHALATION_SOLUTION | RESPIRATORY_TRACT | Status: DC | PRN

## 2018-08-08 MED ORDER — IPRATROPIUM-ALBUTEROL 0.5-2.5 (3) MG/3ML IN SOLN: 3.0000 mL | RESPIRATORY_TRACT | Status: DC | PRN

## 2018-08-08 MED ORDER — MAGNESIUM SULFATE 2 GM/50ML IV SOLN
2.0000 g | Freq: Once | INTRAVENOUS | Status: AC
  Administered 2018-08-08: 2 g via INTRAVENOUS
  Filled 2018-08-08: qty 50

## 2018-08-08 MED ORDER — MAGNESIUM OXIDE 400 (241.3 MG) MG PO TABS
400.0000 mg | ORAL_TABLET | Freq: Two times a day (BID) | ORAL | Status: DC
  Administered 2018-08-09: 400 mg via ORAL
  Filled 2018-08-08 (×2): qty 1

## 2018-08-08 MED ORDER — MOMETASONE FURO-FORMOTEROL FUM 200-5 MCG/ACT IN AERO
2.0000 | INHALATION_SPRAY | Freq: Two times a day (BID) | RESPIRATORY_TRACT | Status: DC
  Administered 2018-08-08 – 2018-08-09 (×3): 2 via RESPIRATORY_TRACT
  Filled 2018-08-08: qty 8.8

## 2018-08-08 MED ORDER — POTASSIUM CHLORIDE 10 MEQ/100ML IV SOLN
10.0000 meq | INTRAVENOUS | Status: AC
  Administered 2018-08-08 (×4): 10 meq via INTRAVENOUS
  Filled 2018-08-08: qty 100

## 2018-08-08 MED ORDER — POLYETHYLENE GLYCOL 3350 17 G PO PACK
17.0000 g | PACK | Freq: Every day | ORAL | Status: DC
  Administered 2018-08-08: 17 g via ORAL
  Filled 2018-08-08: qty 1

## 2018-08-08 NOTE — Progress Notes (Signed)
PROGRESS NOTE  Deanna MansDolores Giacobbe ZOX:[id number] DOB: 21-Jan-1961 DOA: 08/06/2018 PCP: Eartha InchBadger, Michael C, MD  HPI/Brief Narrative  Deanna Yang is a 57 y.o. year old female with medical history significant for HTN, chronic pain on narcotics, fibromyalgia, COPD on 3.5L [place] O2 depression, OSA who presented on 08/06/2018 with vomiting and abdominal pain in setting of no BM over the past 2 weeks. Was recently discharged on 07/24/18 for ileus related to opioid regimen that improved with NGT and optimized bowel regimen. Since recent discharge has been eating a liquid diet because of persistent nausea of abdominal pain. She tried eating a cheeseburger on 08/06/18 and had several episodes of emesis. She confirms she has not had a bowel movement since the last discharge on 07/24/18. She confirms taking MS contin twice daily and percocet 4 times daily, she's had multiple surgeries and is treated for chronic pain.   In the ED patient was medically stable, initial SPO2 88%, heart rate 102-106, blood pressure range 154/118-193/104.  UA was negative, Lipase 31, CMP notable for creatinine of 1.63 (baseline 1-1 0.14) CBC unremarkable.  X-ray show moderate amount of retained large bowel stool with nonobstructive bowel gas pattern.  CT abdomen showed no acute abdominal or large amount of stool throughout the colon no signs of bowel obstruction or active inflammation.  Patient received IV morphine 4 mg x 1 and Zofran before admission to [facility]  Subjective Had large BMs yesterday Stil not eating much +flatus  Assessment/Plan:  Nausea and vomiting secondary to significant constipation without signs of bowel obstruction -most likely secondary to opioid regimen and not being active -Optimize bowel regimen -advance diet as tolerated  AKI, secondary to emesis, improving.   -Prerenal related to hypovolemia (emesis, decreased p.o. Intake) -BMP daily  Sinus Tachycardia.  -seems to be chronic in nature--  last admission was also with increased HR  Hypertensive urgency.   -improved with initiation of home amlodipine and prazosin   Hypokalemia -replete  Prolonged QTc interval -recheck EKG this AM so home regimen can be resumed  Chronic pain/fibromyalgia, stable.   -Continue home MS Contin 30 mg BID, percocet q6 PRN(scheduled at home), gabapentin.  COPD, stable -resume home inhalers -not wheezing on lung exam but does have audible wheezes-- suspect upper airway issue-- outpatient follow up  Anxiety depression, stable.   -resume home meds with .  OSA, stable.  CPAP nightly.  Still not back to baseline with PO intake and concern is for   Code Status: FULL CODE   Family Communication: no family at bedside   Disposition Plan: pending increase in PO intake and improvement of EKG   Consultants:  none    Procedures:  none     DVT prophylaxis: Lovenox   Objective: Vitals:   08/07/18 2144 08/08/18 0144 08/08/18 0233 08/08/18 0631  BP: (!) 174/109 (!) 134/105 (!) 132/96   Pulse: (!) 116 (!) 122 (!) 120   Resp:  18    Temp: 98.3 F (36.8 C)     TempSrc: Oral     SpO2: (!) 89% 93%  94%  Weight:      Height:        Intake/Output Summary (Last 24 hours) at 08/08/2018 0946 Last data filed at 08/08/2018 [id number] Gross per 24 hour  Intake 3350.38 ml  Output -  Net 3350.38 ml   Filed Weights   08/07/18 0010  Weight: 98.4 kg    Exam:  In bed, chronically ill appearing Sinus tachy Audible wheezing-- appears  to be upper airway; lung fields diminished with no wheezing Diminished bowel sounds, obese, NT  Data Reviewed: CBC: Recent Labs  Lab 08/06/18 1812 08/07/18 0651  WBC 10.0 8.5  HGB 14.9 14.2  HCT 47.0* 43.9  MCV 86.4 86.1  PLT 504* 472*   Basic Metabolic Panel: Recent Labs  Lab 08/06/18 1812 08/07/18 0651 08/08/18 0452  NA 140 141 140  K 3.4* 3.4* 3.1*  CL 98 102 105  CO2 29 28 24   GLUCOSE 123* 125* 130*  BUN 9 7 <5*  CREATININE 1.63* 1.29*  1.04*  CALCIUM 9.5 9.0 8.6*  MG  --  1.7  --    GFR: Estimated Creatinine Clearance: 72.3 mL/min (A) (by C-G formula based on SCr of 1.04 mg/dL (H)). Liver Function Tests: Recent Labs  Lab 08/06/18 1812  AST 29  ALT 30  ALKPHOS 95  BILITOT 0.7  PROT 7.6  ALBUMIN 3.8   Recent Labs  Lab 08/06/18 1812  LIPASE 31   No results for input(s): AMMONIA in the last 168 hours. Coagulation Profile: No results for input(s): INR, PROTIME in the last 168 hours. Cardiac Enzymes: No results for input(s): CKTOTAL, CKMB, CKMBINDEX, TROPONINI in the last 168 hours. BNP (last 3 results) No results for input(s): PROBNP in the last 8760 hours. HbA1C: No results for input(s): HGBA1C in the last 72 hours. CBG: No results for input(s): GLUCAP in the last 168 hours. Lipid Profile: No results for input(s): CHOL, HDL, LDLCALC, TRIG, CHOLHDL, LDLDIRECT in the last 72 hours. Thyroid Function Tests: No results for input(s): TSH, T4TOTAL, FREET4, T3FREE, THYROIDAB in the last 72 hours. Anemia Panel: No results for input(s): VITAMINB12, FOLATE, FERRITIN, TIBC, IRON, RETICCTPCT in the last 72 hours. Urine analysis:    Component Value Date/Time   COLORURINE STRAW (A) 08/06/2018 2117   APPEARANCEUR CLEAR 08/06/2018 2117   LABSPEC 1.006 08/06/2018 2117   PHURINE 8.0 08/06/2018 2117   GLUCOSEU NEGATIVE 08/06/2018 2117   HGBUR NEGATIVE 08/06/2018 2117   BILIRUBINUR NEGATIVE 08/06/2018 2117   KETONESUR NEGATIVE 08/06/2018 2117   PROTEINUR NEGATIVE 08/06/2018 2117   NITRITE NEGATIVE 08/06/2018 2117   LEUKOCYTESUR NEGATIVE 08/06/2018 2117     )No results found for this or any previous visit (from the past 240 hour(s)).    Studies: No results found.  Scheduled Meds: . amLODipine  10 mg Oral Daily  . buPROPion  300 mg Oral Daily  . enoxaparin (LOVENOX) injection  40 mg Subcutaneous QHS  . feeding supplement  1 Container Oral TID BM  . gabapentin  1,800 mg Oral QHS  . mometasone-formoterol  2  puff Inhalation BID  . montelukast  10 mg Oral QHS  . morphine  30 mg Oral Q12H  . multivitamin with minerals  1 tablet Oral Daily  . prazosin  2 mg Oral QHS  . senna  1 tablet Oral BID  . sodium chloride flush  3 mL Intravenous Q12H    Continuous Infusions: . potassium chloride 10 mEq (08/08/18 0928)     LOS: 0 days     Joseph Art, DO [facility]   If 7PM-7AM, please contact night-coverage [email or web address] Password [facility] 08/08/2018, 9:46 AM

## 2018-08-08 NOTE — Care Management Obs Status (Signed)
MEDICARE OBSERVATION STATUS NOTIFICATION   Patient Details  Name: Deanna Yang MRN: [id number] Date of Birth: January 21, 1961   Medicare Observation Status Notification Given:  Yes    [name], Shardee Dieu Marie, RN 08/08/2018, 11:14 AM

## 2018-08-08 NOTE — Care Management Note (Addendum)
Case Management Note  Patient Details  Name: Deanna Yang MRN: [id number] Date of Birth: 09/29/1961  Subjective/Objective:                    Action/Plan:  Patient from home with home oxygen as needed at night will continue to follow for discharge needs. Expected Discharge Date:  08/08/18               Expected Discharge Plan:  Home/Self Care  In-House Referral:     Discharge planning Services     Post Acute Care Choice:    Choice offered to:  Patient  DME Arranged:    DME Agency:     HH Arranged:    HH Agency:     Status of Service:  In process, will continue to follow  If discussed at Long Length of Stay Meetings, dates discussed:    Additional Comments:  [name], [name], RN 08/08/2018, 11:11 AM

## 2018-08-09 DIAGNOSIS — R112 Nausea with vomiting, unspecified: Secondary | ICD-10-CM | POA: Diagnosis not present

## 2018-08-09 DIAGNOSIS — N179 Acute kidney failure, unspecified: Secondary | ICD-10-CM | POA: Diagnosis not present

## 2018-08-09 DIAGNOSIS — R000 Tachycardia, unspecified: Secondary | ICD-10-CM

## 2018-08-09 DIAGNOSIS — R9431 Abnormal electrocardiogram [ECG] [EKG]: Secondary | ICD-10-CM | POA: Diagnosis not present

## 2018-08-09 DIAGNOSIS — K5903 Drug induced constipation: Secondary | ICD-10-CM | POA: Diagnosis not present

## 2018-08-09 LAB — CBC
HEMATOCRIT: 40.5 % (ref 36.0–46.0)
HEMOGLOBIN: 13.2 g/dL (ref 12.0–15.0)
MCH: 28 pg (ref 26.0–34.0)
MCHC: 32.6 g/dL (ref 30.0–36.0)
MCV: 86 fL (ref 78.0–100.0)
Platelets: 377 10*3/uL (ref 150–400)
RBC: 4.71 MIL/uL (ref 3.87–5.11)
RDW: 13.5 % (ref 11.5–15.5)
WBC: 9.2 10*3/uL (ref 4.0–10.5)

## 2018-08-09 LAB — BASIC METABOLIC PANEL
Anion gap: 9 (ref 5–15)
BUN: <5 mg/dL — ABNORMAL LOW (ref 6–20)
CHLORIDE: 106 mmol/L (ref 98–111)
CO2: 27 mmol/L (ref 22–32)
CREATININE: 1.12 mg/dL — AB (ref 0.44–1.00)
Calcium: 8.6 mg/dL — ABNORMAL LOW (ref 8.9–10.3)
GFR calc non Af Amer: 54 mL/min — ABNORMAL LOW (ref >60)
Glucose, Bld: 140 mg/dL — ABNORMAL HIGH (ref 70–99)
Potassium: 3.1 mmol/L — ABNORMAL LOW (ref 3.5–5.1)
Sodium: 142 mmol/L (ref 135–145)

## 2018-08-09 MED ORDER — MAGNESIUM OXIDE 400 (241.3 MG) MG PO TABS: 400.0000 mg | ORAL_TABLET | Freq: Two times a day (BID) | ORAL | 0 refills | Status: DC

## 2018-08-09 MED ORDER — OXYCODONE-ACETAMINOPHEN 10-325 MG PO TABS: 1.0000 | ORAL_TABLET | Freq: Four times a day (QID) | ORAL | 0 refills | Status: AC | PRN

## 2018-08-09 MED ORDER — HYDROXYZINE HCL 25 MG PO TABS: 25.0000 mg | ORAL_TABLET | Freq: Four times a day (QID) | ORAL | Status: DC | PRN

## 2018-08-09 MED ORDER — FLUTICASONE PROPIONATE 50 MCG/ACT NA SUSP
1.0000 | Freq: Two times a day (BID) | NASAL | Status: DC
  Filled 2018-08-09: qty 16

## 2018-08-09 MED ORDER — MINERAL OIL RE ENEM
1.0000 | ENEMA | Freq: Once | RECTAL | Status: AC
  Administered 2018-08-09: 1 via RECTAL
  Filled 2018-08-09: qty 1

## 2018-08-09 MED ORDER — POLYETHYLENE GLYCOL 3350 17 G PO PACK
17.0000 g | PACK | Freq: Every day | ORAL | Status: DC
  Filled 2018-08-09: qty 1

## 2018-08-09 MED ORDER — POTASSIUM CHLORIDE CRYS ER 20 MEQ PO TBCR
40.0000 meq | EXTENDED_RELEASE_TABLET | Freq: Once | ORAL | Status: AC
  Administered 2018-08-09: 40 meq via ORAL
  Filled 2018-08-09: qty 2

## 2018-08-09 MED ORDER — BISACODYL 10 MG RE SUPP: 10.0000 mg | Freq: Every day | RECTAL | Status: DC | PRN

## 2018-08-09 MED ORDER — SORBITOL 70 % SOLN
15.0000 mL | Freq: Every day | Status: DC | PRN
  Filled 2018-08-09: qty 30

## 2018-08-09 MED ORDER — SORBITOL 70 % SOLN: 15.0000 mL | Freq: Every day | 0 refills | Status: DC | PRN

## 2018-08-09 MED ORDER — SENNA 8.6 MG PO TABS: 1.0000 | ORAL_TABLET | Freq: Two times a day (BID) | ORAL | 0 refills | Status: DC

## 2018-08-09 MED ORDER — BISACODYL 10 MG RE SUPP: 10.0000 mg | Freq: Every day | RECTAL | 0 refills | Status: DC | PRN

## 2018-08-09 NOTE — Discharge Summary (Signed)
Physician Discharge Summary  Deanna Yang ZOX:[id number] DOB: 07/08/1961 DOA: 08/06/2018  PCP: Eartha Inch, MD  Admit date: 08/06/2018 Discharge date: 08/09/2018  Admitted From: home Discharge disposition: home   Recommendations for Outpatient Follow-Up:   1. Needs bowel regimen and close GI follow up-- may need agent such as movantik for opioid induced constipation 2. BMP 1 week re: K 3. Monitor Qtc as on multiple prolonging medications   Discharge Diagnosis:   Principal Problem:   Constipation due to pain medication therapy Active Problems:   Obstructive apnea   Prolonged QT interval   Nausea and vomiting   AKI (acute kidney injury) (HCC)   Sinus tachycardia    Discharge Condition: Improved.  Diet recommendation: Low sodium, heart healthy.  Carbohydrate-modified  Wound care: None.  Code status: Full.   History of Present Illness:   Deanna Yang is a 57 y.o. female with medical history significant of HTN, chronic pain, depression, sleep apnea; who presents with complaints of constipation.  Patient reports not having a bowel movement over 14 days.  She reports that she has not passed gas since the day of leaving the hospital.  She is tried suppositories, at least 3 Fleet enemas, mag citrate, and others without relief of symptoms.  Associated symptoms include malaise, nausea, vomiting, and epigastric pain that she describes as "tightness feeling".  Patient reports normally not looking at emesis, but reports seeing some brown liquid in the trash can at home previously.  Last admitted into the hospital on 8/8-8/13 for paralytic ileus and acute renal failure.  GI have been evaluated patient during last hospitalization who recommended tapering narcotics of ileus recurs has no other cause was found for symptoms.  Patient denies having any fever, chills, chest pain, diarrhea, or dysuria.   Hospital Course by Problem:   Nausea and vomiting secondary to significant  constipation without signs of bowel obstruction -most likely secondary to opioid regimen and not being active -Optimize bowel regimen-- can not tolerate powder such as miralax -eating well  AKI, secondary to emesis, improving.   -Prerenal related to hypovolemia (emesis, decreased p.o. Intake) -improved  Sinus Tachycardia.  -seems to be chronic in nature-- last admission was also with increased HR -improved  Hypertensive urgency.   -improved with initiation of home amlodipine and prazosin   Hypokalemia -repleted -will need close follow up -Mg given as well  Prolonged QTc interval -resolved -will need to be monitored as an outpatient  Chronic pain/fibromyalgia, stable.   -Continue home MS Contin 30 mg BID, percocet q6 PRN(scheduled at home), gabapentin.  COPD, stable -resume home inhalers  Anxiety depression, stable.   -resume home meds  OSA, stable.  CPAP nightly.     Medical Consultants:      Discharge Exam:   Vitals:   08/09/18 0434 08/09/18 0750  BP: (!) 148/90   Pulse: (!) 107   Resp: 18   Temp: 97.8 F (36.6 C)   SpO2: 98% 97%   Vitals:   08/08/18 2028 08/08/18 2049 08/09/18 0434 08/09/18 0750  BP:  (!) 149/92 (!) 148/90   Pulse: (!) 117 (!) 103 (!) 107   Resp: 18 18 18    Temp:  99.4 F (37.4 C) 97.8 F (36.6 C)   TempSrc:  Axillary Oral   SpO2: 96% 95% 98% 97%  Weight:      Height:        General exam: Appears calm and comfortable- felt as if SMOG enema helped her the  most  The results of significant diagnostics from this hospitalization (including imaging, microbiology, ancillary and laboratory) are listed below for reference.     Procedures and Diagnostic Studies:   Ct Abdomen Pelvis Wo Contrast  Result Date: 08/06/2018 CLINICAL DATA:  57 year old female with abdominal pain and distention. EXAM: CT ABDOMEN AND PELVIS WITHOUT CONTRAST TECHNIQUE: Multidetector CT imaging of the abdomen and pelvis was performed following the  standard protocol without IV contrast. COMPARISON:  Abdominal radiograph dated 08/06/2018 and CT dated 07/23/2018. FINDINGS: Evaluation of this exam is limited in the absence of intravenous contrast. Lower chest: The visualized lung bases are clear. No intra-abdominal free air or free fluid. Hepatobiliary: There is diffuse fatty infiltration of the liver. No intrahepatic biliary ductal dilatation. Cholecystectomy. Pancreas: Unremarkable. No pancreatic ductal dilatation or surrounding inflammatory changes. Spleen: Normal in size without focal abnormality. Adrenals/Urinary Tract: The adrenal glands are unremarkable. The kidneys, visualized ureters, and urinary bladder appear unremarkable as well. Stomach/Bowel: There is large amount of stool throughout the colon mixed with oral contrast from prior study. There is no bowel obstruction or active inflammation. Appendectomy. Vascular/Lymphatic: Mild aortoiliac atherosclerotic disease. No portal venous gas. There is no adenopathy. Reproductive: The uterus is anteverted. There is a 2 cm calcified fundal fibroid. The ovaries are grossly unremarkable as visualized. Other: Midline vertical anterior abdominal wall incisional scar. Musculoskeletal: Degenerative changes primarily at L5-S1. No acute osseous pathology. IMPRESSION: 1. No acute intra-abdominal or pelvic pathology. 2. Fatty liver. Electronically Signed   By: Elgie Collard M.D.   On: 08/06/2018 21:13   Dg Abdomen 1 View  Result Date: 08/06/2018 CLINICAL DATA:  History of small bowel obstruction. EXAM: ABDOMEN - 1 VIEW COMPARISON:  Abdominal radiograph July 23, 2018 and CT abdomen and pelvis July 23, 2018. FINDINGS: The bowel gas pattern is normal. Moderate amount of retained large bowel stool. Surgical clips in the included right abdomen compatible with cholecystectomy. No radio-opaque calculi or other significant radiographic abnormality are seen. Phleboliths project in the pelvis. IMPRESSION: 1. Moderate  amount of retained large bowel stool. Nonobstructive bowel gas pattern. Electronically Signed   By: Awilda Metro M.D.   On: 08/06/2018 19:05     Labs:   Basic Metabolic Panel: Recent Labs  Lab 08/06/18 1812 08/07/18 0651 08/08/18 0452 08/09/18 0609  NA 140 141 140 142  K 3.4* 3.4* 3.1* 3.1*  CL 98 102 105 106  CO2 29 28 24 27   GLUCOSE 123* 125* 130* 140*  BUN 9 7 <5* <5*  CREATININE 1.63* 1.29* 1.04* 1.12*  CALCIUM 9.5 9.0 8.6* 8.6*  MG  --  1.7  --   --    GFR Estimated Creatinine Clearance: 67.1 mL/min (A) (by C-G formula based on SCr of 1.12 mg/dL (H)). Liver Function Tests: Recent Labs  Lab 08/06/18 1812  AST 29  ALT 30  ALKPHOS 95  BILITOT 0.7  PROT 7.6  ALBUMIN 3.8   Recent Labs  Lab 08/06/18 1812  LIPASE 31   No results for input(s): AMMONIA in the last 168 hours. Coagulation profile No results for input(s): INR, PROTIME in the last 168 hours.  CBC: Recent Labs  Lab 08/06/18 1812 08/07/18 0651 08/09/18 0609  WBC 10.0 8.5 9.2  HGB 14.9 14.2 13.2  HCT 47.0* 43.9 40.5  MCV 86.4 86.1 86.0  PLT 504* 472* 377   Cardiac Enzymes: No results for input(s): CKTOTAL, CKMB, CKMBINDEX, TROPONINI in the last 168 hours. BNP: Invalid input(s): POCBNP CBG: No results for input(s): GLUCAP in  the last 168 hours. D-Dimer No results for input(s): DDIMER in the last 72 hours. Hgb A1c No results for input(s): HGBA1C in the last 72 hours. Lipid Profile No results for input(s): CHOL, HDL, LDLCALC, TRIG, CHOLHDL, LDLDIRECT in the last 72 hours. Thyroid function studies No results for input(s): TSH, T4TOTAL, T3FREE, THYROIDAB in the last 72 hours.  Invalid input(s): FREET3 Anemia work up No results for input(s): VITAMINB12, FOLATE, FERRITIN, TIBC, IRON, RETICCTPCT in the last 72 hours. Microbiology No results found for this or any previous visit (from the past 240 hour(s)).   Discharge Instructions:   Discharge Instructions    Diet - low sodium heart  healthy   Complete by:  As directed    Discharge instructions   Complete by:  As directed    Need bowel regimen to avoid constipation induced by opioids-- may need Movantik or relistor if bowel regimen does not work or may need a decrease in chronic pain medications   Increase activity slowly   Complete by:  As directed      Allergies as of 08/09/2018      Reactions   Ambien [zolpidem Tartrate] Shortness Of Breath, Swelling   Tongue swelling, throat swelling.   Beta Adrenergic Blockers Anaphylaxis   Fish Allergy Shortness Of Breath, Rash   Iodinated Diagnostic Agents Itching   Benadryl 50MG  prophylaxis before and after and patient reports she did fine   Latex Itching, Other (See Comments)   Blisters skin   Metoprolol Other (See Comments)   ANY MEDICATION THAT ENDS IN -OLOL-   The patient is asthmatic.   Penicillins Shortness Of Breath, Rash, Other (See Comments)   Has patient had a PCN reaction causing immediate rash, facial/tongue/throat swelling, SOB or lightheadedness with hypotension: Yes Has patient had a PCN reaction causing severe rash involving mucus membranes or skin necrosis: Unknown Has patient had a PCN reaction that required hospitalization No Has patient had a PCN reaction occurring within the last 10 years: No If all of the above answers are "NO", then may proceed with Cephalosporin use.   Red Dye Hives, Shortness Of Breath   Shellfish Allergy Anaphylaxis   Sulfa Antibiotics Hives, Shortness Of Breath   Amoxicillin-pot Clavulanate Nausea And Vomiting   Aspirin Other (See Comments)   Blisters on tongue from higher doses - tolerates 81 mg   Dye Fdc Red  [amaranth (fd&c Red #2)] Hives   X ray DYE  (BENADRYL 50 MG PROPHYLAXIS AND AFTER AND  SHE DID FINE, PER PATIENT.).   Tape Dermatitis, Rash   Adhesive   Talwin [pentazocine] Other (See Comments)   Unknown reaction   Levofloxacin Rash, Other (See Comments)   headache   Seroquel [quetiapine Fumarate] Other (See  Comments)   hallucinate      Medication List    STOP taking these medications   cetirizine 10 MG tablet Commonly known as:  ZYRTEC   citalopram 40 MG tablet Commonly known as:  CELEXA   diazepam 2 MG tablet Commonly known as:  VALIUM   hydrochlorothiazide 25 MG tablet Commonly known as:  HYDRODIURIL   promethazine 25 MG tablet Commonly known as:  PHENERGAN   tiZANidine 4 MG tablet Commonly known as:  ZANAFLEX     TAKE these medications   acyclovir ointment 5 % Commonly known as:  ZOVIRAX Apply 1 application topically every 3 (three) hours as needed (mouth sores).   amLODipine 10 MG tablet Commonly known as:  NORVASC Take 10 mg by mouth daily.  bisacodyl 10 MG suppository Commonly known as:  DULCOLAX Place 1 suppository (10 mg total) rectally daily as needed for moderate constipation.   budesonide-formoterol 160-4.5 MCG/ACT inhaler Commonly known as:  SYMBICORT Inhale 2 puffs into the lungs 2 (two) times daily.   buPROPion 300 MG 24 hr tablet Commonly known as:  WELLBUTRIN XL Take 1 tablet (300 mg total) by mouth daily.   fluticasone 50 MCG/ACT nasal spray Commonly known as:  FLONASE Place 1 spray into both nostrils 2 (two) times daily.   gabapentin 600 MG tablet Commonly known as:  NEURONTIN Take 1,800 mg by mouth at bedtime. What changed:  Another medication with the same name was removed. Continue taking this medication, and follow the directions you see here.   hydrOXYzine 25 MG tablet Commonly known as:  ATARAX/VISTARIL Take 1 tablet (25 mg total) by mouth every 6 (six) hours as needed for anxiety (mild anxiety). What changed:  reasons to take this   Immune Globulin 10% 10 GM/100ML Soln Inject into the vein every 30 (thirty) days.   ipratropium-albuterol 0.5-2.5 (3) MG/3ML Soln Commonly known as:  DUONEB Inhale 3 mLs into the lungs every 4 (four) hours as needed for wheezing or shortness of breath.   magnesium oxide 400 (241.3 Mg) MG  tablet Commonly known as:  MAG-OX Take 1 tablet (400 mg total) by mouth 2 (two) times daily.   montelukast 10 MG tablet Commonly known as:  SINGULAIR Take 10 mg by mouth at bedtime.   morphine 30 MG 12 hr tablet Commonly known as:  MS CONTIN Take 1 tablet (30 mg total) by mouth every 12 (twelve) hours.   nystatin powder Generic drug:  nystatin Apply 1 application topically 3 (three) times daily as needed (blistering sores under breast).   oxyCODONE-acetaminophen 10-325 MG tablet Commonly known as:  PERCOCET Take 1 tablet by mouth every 6 (six) hours as needed for pain. Hold for sedation What changed:    when to take this  reasons to take this   prazosin 2 MG capsule Commonly known as:  MINIPRESS Take 2 mg by mouth at bedtime.   PROAIR HFA 108 (90 Base) MCG/ACT inhaler Generic drug:  albuterol Inhale 2 puffs into the lungs every 4 (four) hours as needed for wheezing.   QUEtiapine 25 MG tablet Commonly known as:  SEROQUEL Take 12.5-50 mg by mouth See admin instructions. Take 1/2 -1 tablet (12.5-25 mg) by mouth up to twice daily for anxiety and hallucinations, take 2 tablets (50 mg) at bedtime for sleep   senna 8.6 MG Tabs tablet Commonly known as:  SENOKOT Take 1 tablet (8.6 mg total) by mouth 2 (two) times daily.   sorbitol 70 % Soln Take 15 mLs by mouth daily as needed for moderate constipation.      Follow-up Information    Eartha InchBadger, Michael C, MD Follow up in 1 week(s).   Specialty:  Family Medicine Why:  BMP for Potassium levels Contact information: [address] [place] [id number] [phone]        schedule appointment with GI doc in [place] Follow up.            Time coordinating discharge: 25 min  Signed:  Joseph ArtJessica U Simran Mannis  [facility] 08/09/2018, 9:06 AM

## 2018-08-09 NOTE — Progress Notes (Signed)
Pt's b/p 166/106.  Hydralazine 10 mg given.  Recheck b/p 153/96

## 2020-01-06 IMAGING — DX DG ABD PORTABLE 1V
1 series · 1 of 1 positions shown · non-contrast
Comparison: Abdominal radiograph performed 07/19/2018

CLINICAL DATA: Nasogastric tube placement.

EXAM:
PORTABLE ABDOMEN - 1 VIEW

[abdomen kub]
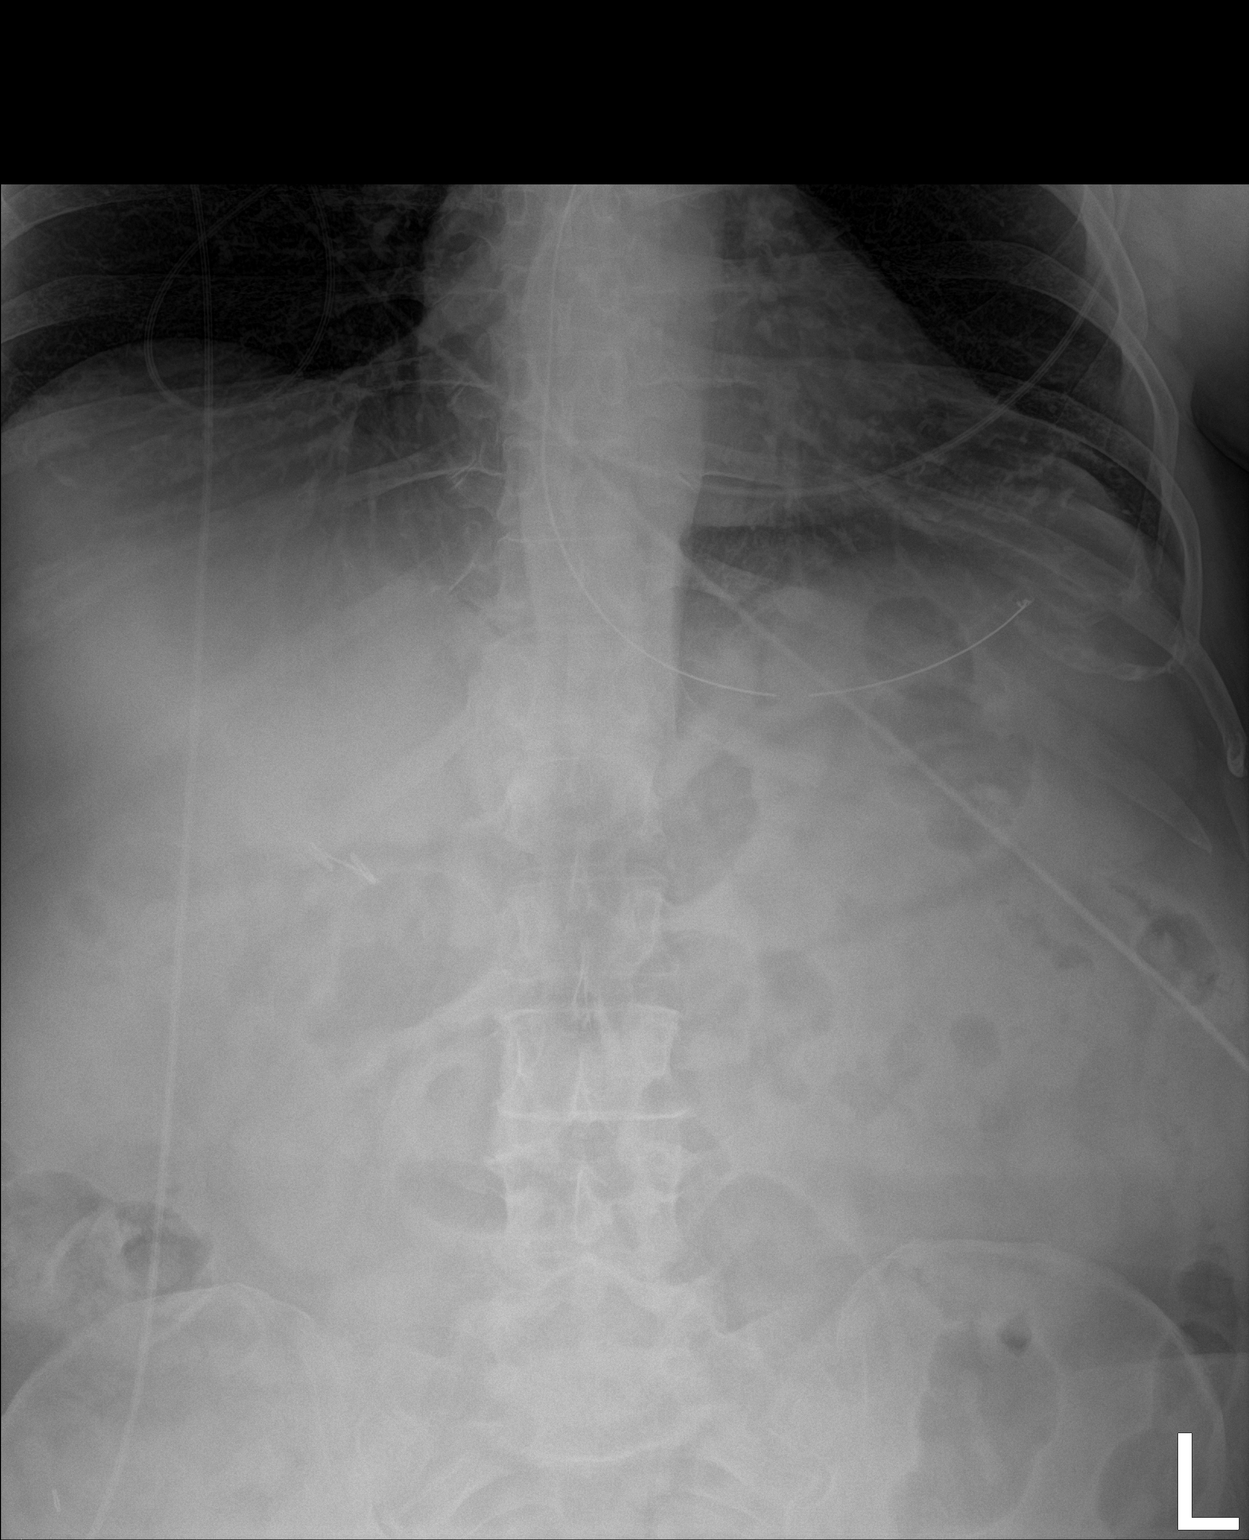

[1 of 1 positions shown; findings below may reference images not displayed]

FINDINGS: The patient's enteric tube is seen ending overlying the body of the
stomach, with the side port about the fundus of the stomach.

The visualized bowel gas pattern is grossly unremarkable. Clips are
noted at the right upper quadrant, reflecting prior cholecystectomy.
The visualized portions of the lungs are grossly clear. No acute
osseous abnormalities are seen.
IMPRESSION: Enteric tube noted ending overlying the body of the stomach.

## 2020-01-07 IMAGING — DX DG ABD PORTABLE 2V
1 series · 3 of 3 positions shown · non-contrast
Comparison: July 20, 2018

CLINICAL DATA: Evaluate vomiting

EXAM:
PORTABLE ABDOMEN - 2 VIEW

[Series 1: abdomen · 0.14mm/px · 3 of 3 slices shown]
[im 1/3]
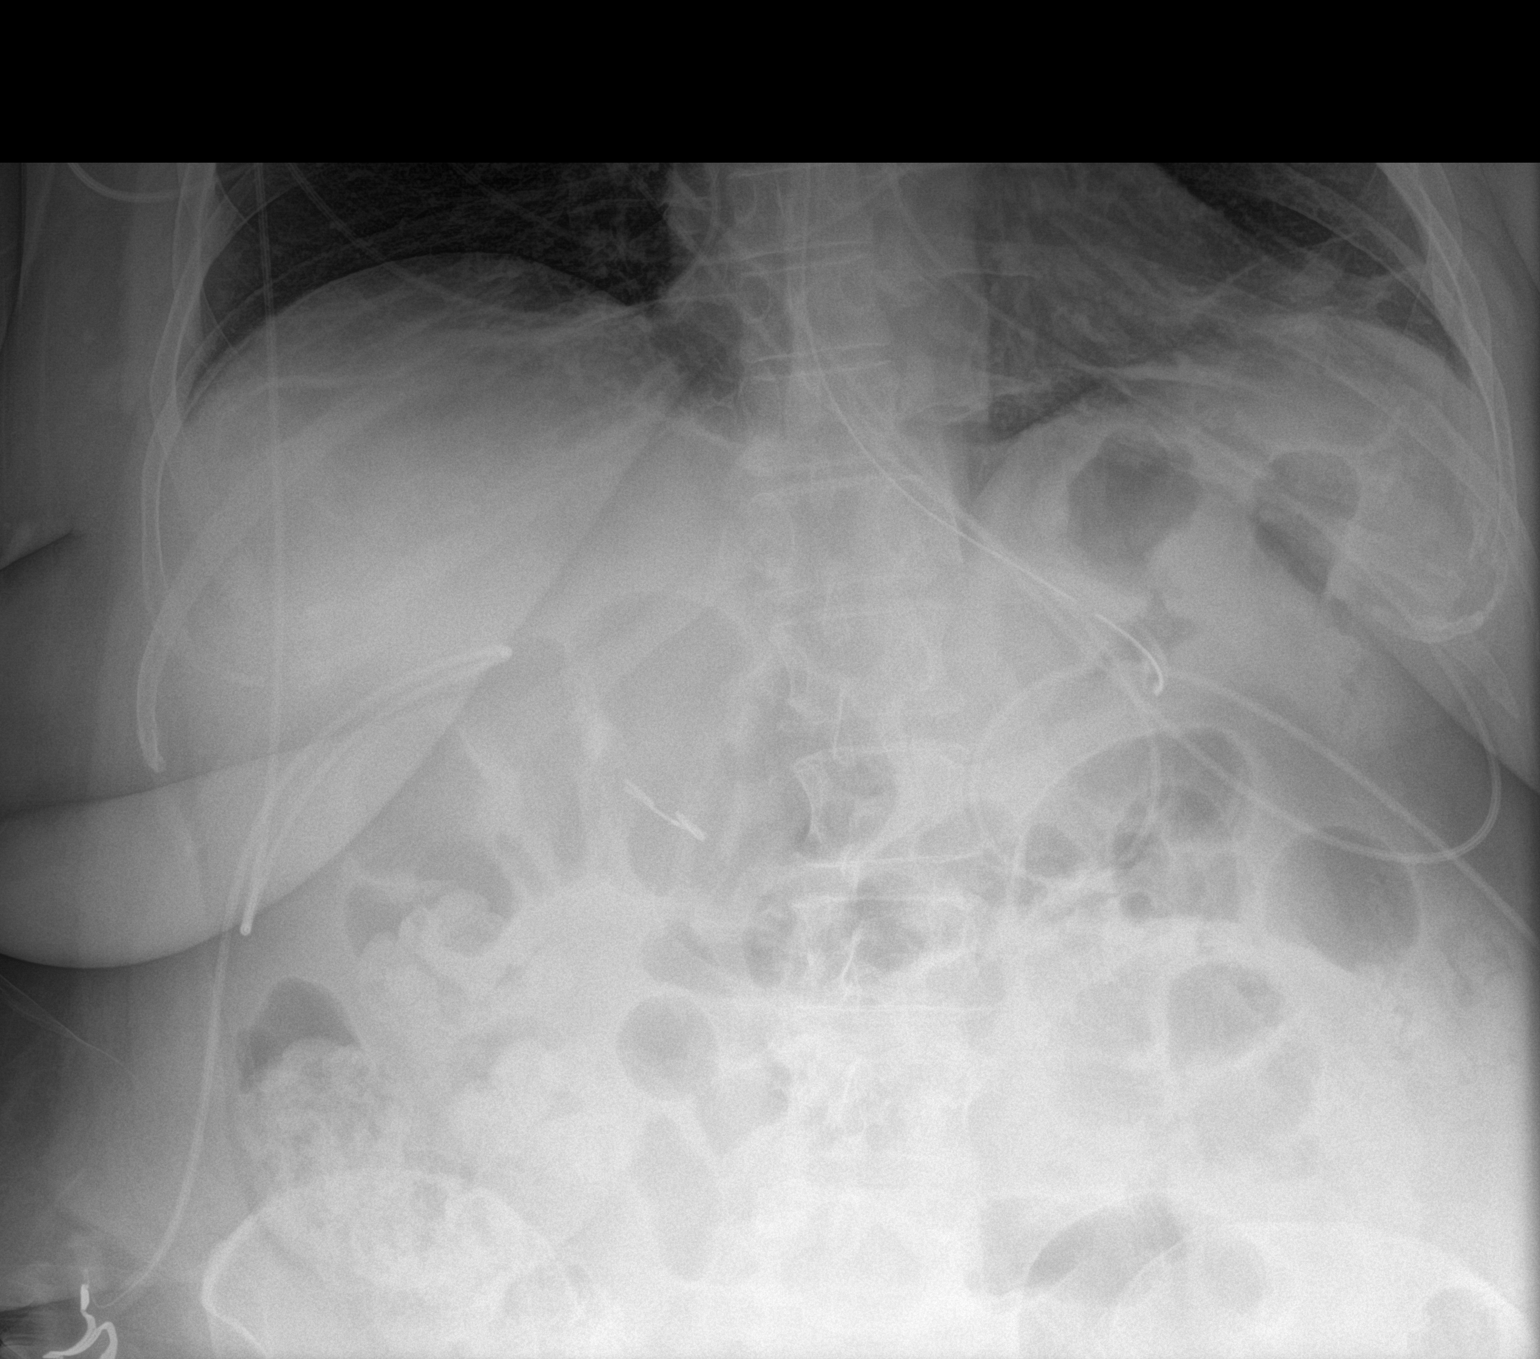
[im 2/3]
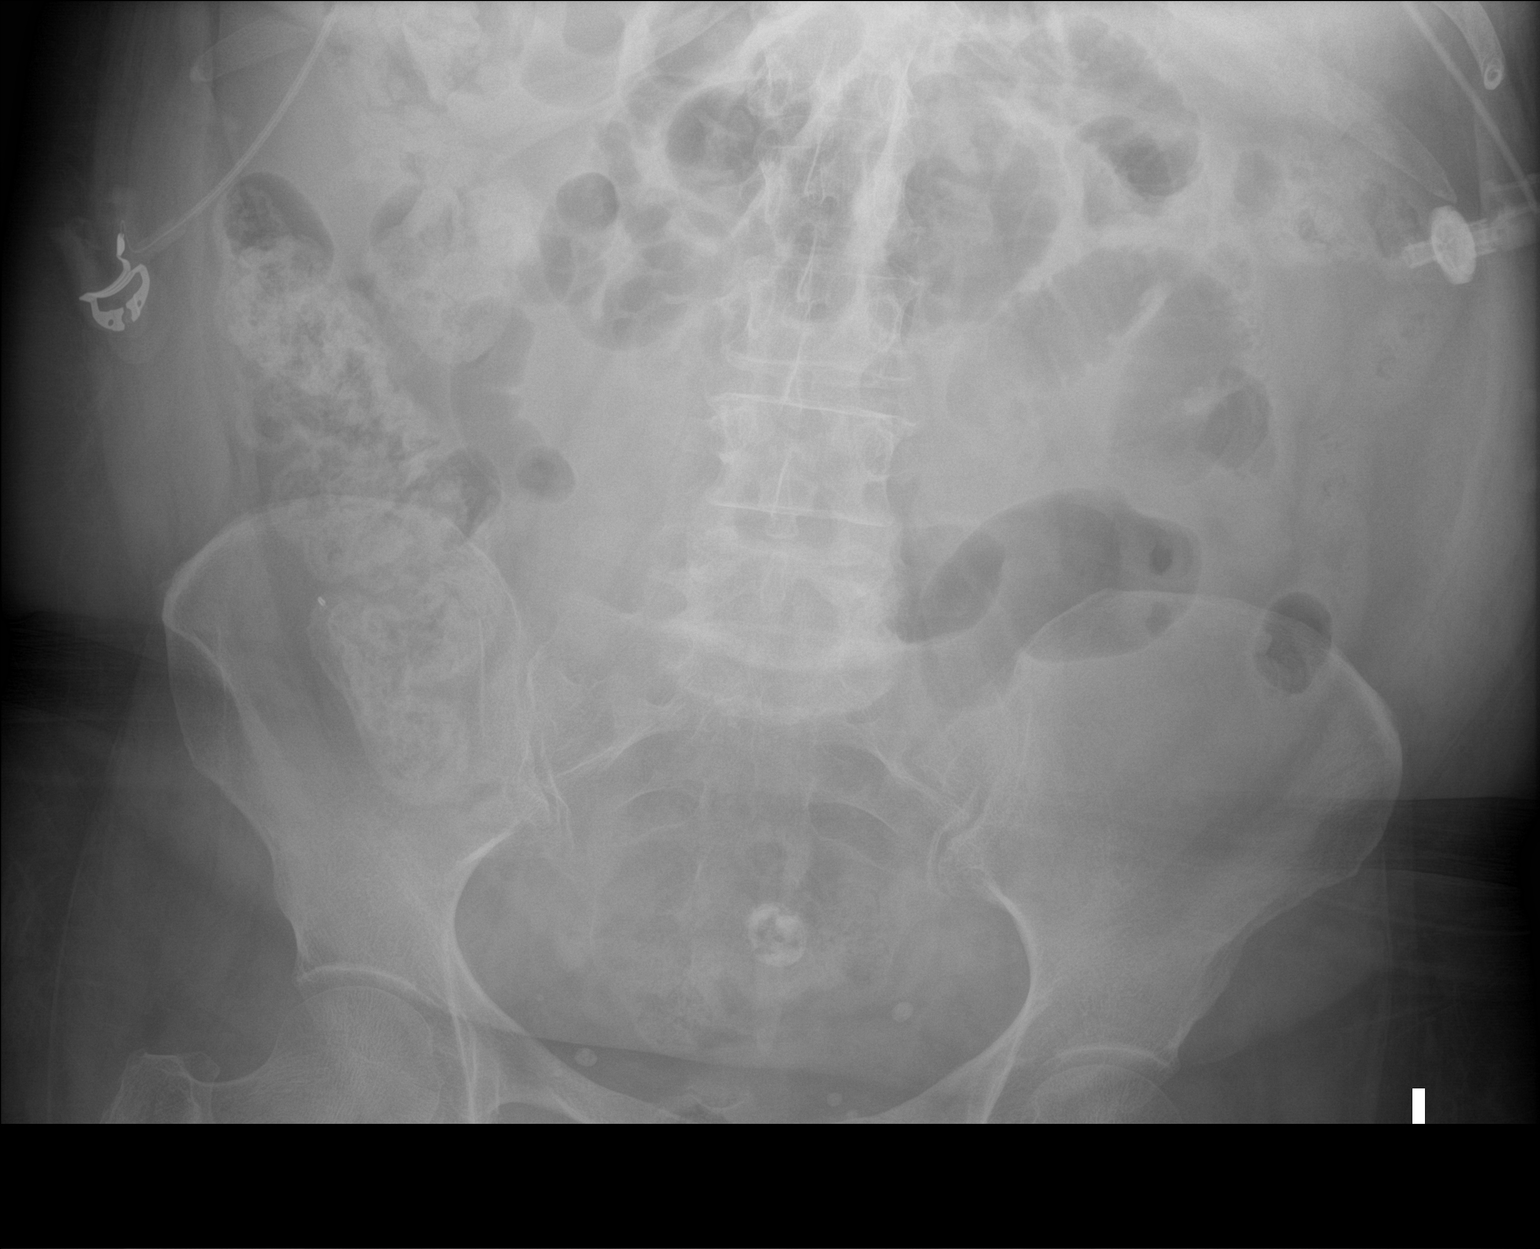
[im 3/3]
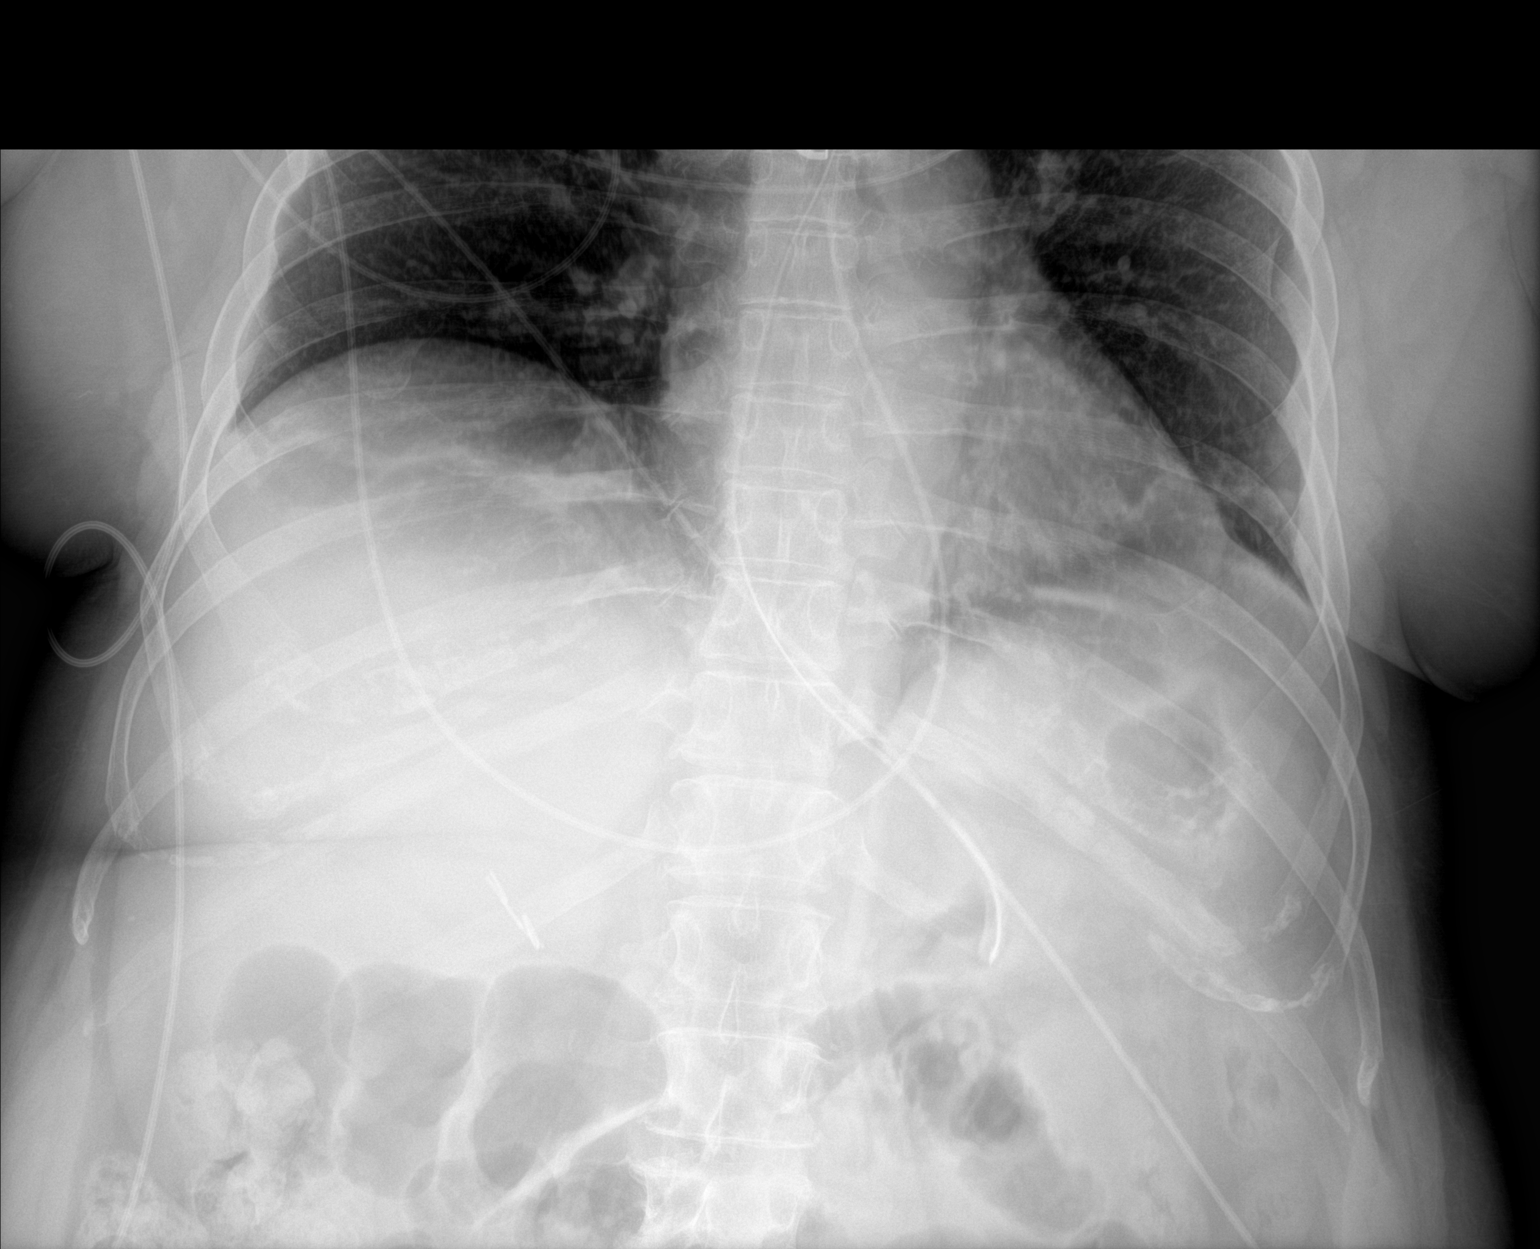

[3 of 3 positions shown; findings below may reference images not displayed]

FINDINGS: The NG tube terminates in the left upper quadrant, likely a stomach.
Mild atelectasis in the left base. Lung bases otherwise normal.
Cholecystectomy clips. No free air, portal venous gas, or
pneumatosis. No evidence of bowel obstruction. Possible small
calcified fibroid in the uterus. No other abnormalities.
IMPRESSION: The NG tube is in good position. No cause for the patient's symptoms
identified.

## 2020-01-09 IMAGING — DX DG ABD PORTABLE 1V
1 series · 1 of 1 positions shown · non-contrast
Comparison: CT abdomen and pelvis 07/23/2018.

CLINICAL DATA: Small bowel obstruction protocol. 8 hour delay.

EXAM:
PORTABLE ABDOMEN - 1 VIEW

[abdomen]
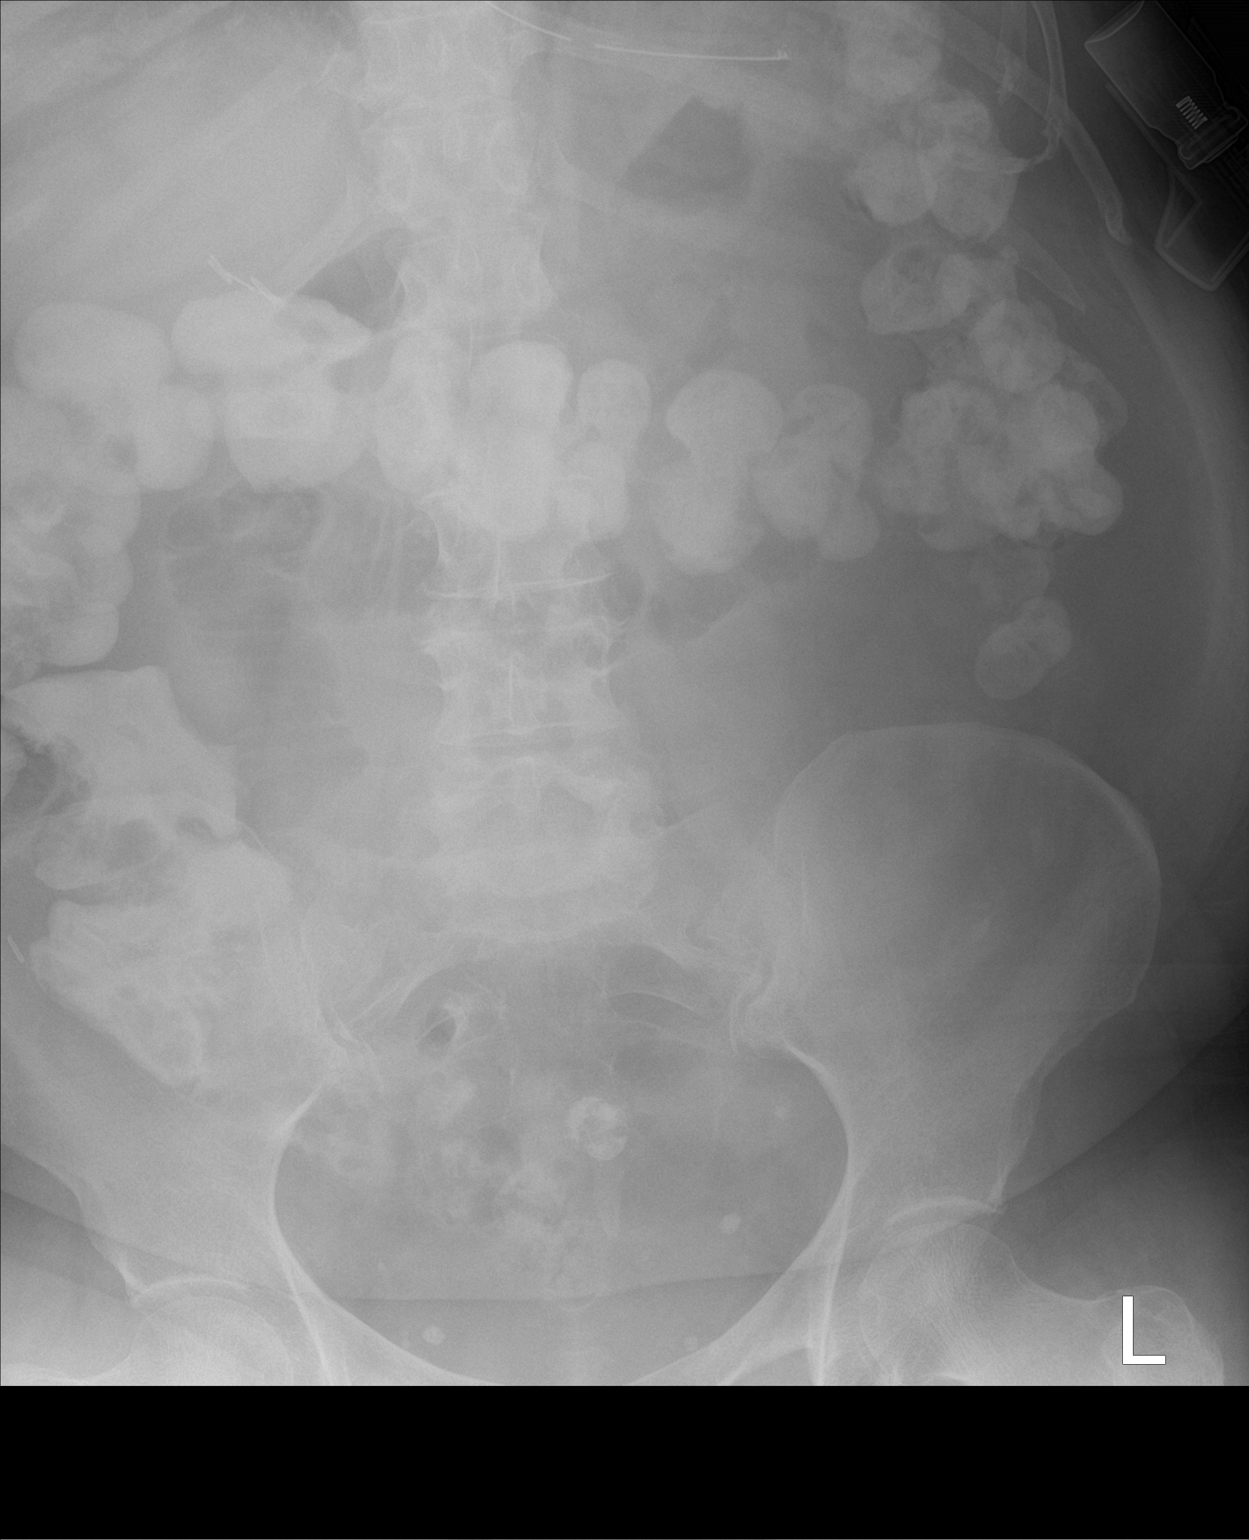

[1 of 1 positions shown; findings below may reference images not displayed]

FINDINGS: Contrast material is demonstrated throughout the colon suggesting no
evidence of high-grade bowel obstruction. There a few mildly
prominent gas-filled small bowel loops which could indicate ileus.
An enteric tube is present with tip in the left upper quadrant
consistent with location in the upper stomach. Surgical clips in the
right upper quadrant. Calcification in the pelvis likely represents
a uterine fibroid.
IMPRESSION: Contrast material is demonstrated throughout the colon suggesting no
evidence of high-grade bowel obstruction.

## 2022-04-01 ENCOUNTER — Emergency Department (HOSPITAL_COMMUNITY): Payer: 59

## 2022-04-01 ENCOUNTER — Emergency Department (HOSPITAL_COMMUNITY)
Admission: EM | Admit: 2022-04-01 | Discharge: 2022-04-01 | Discharge status: Against Medical Advice | Payer: 59 | Attending: Emergency Medicine | Admitting: Emergency Medicine

## 2022-04-01 ENCOUNTER — Other Ambulatory Visit: Payer: Self-pay

## 2022-04-01 DIAGNOSIS — Z79899 Other long term (current) drug therapy: Secondary | ICD-10-CM | POA: Diagnosis not present

## 2022-04-01 DIAGNOSIS — Z91040 Latex allergy status: Secondary | ICD-10-CM | POA: Insufficient documentation

## 2022-04-01 DIAGNOSIS — S52511A Displaced fracture of right radial styloid process, initial encounter for closed fracture: Secondary | ICD-10-CM | POA: Insufficient documentation

## 2022-04-01 DIAGNOSIS — S6991XA Unspecified injury of right wrist, hand and finger(s), initial encounter: Secondary | ICD-10-CM | POA: Diagnosis present

## 2022-04-01 DIAGNOSIS — R778 Other specified abnormalities of plasma proteins: Secondary | ICD-10-CM

## 2022-04-01 DIAGNOSIS — R61 Generalized hyperhidrosis: Secondary | ICD-10-CM | POA: Insufficient documentation

## 2022-04-01 DIAGNOSIS — W01198A Fall on same level from slipping, tripping and stumbling with subsequent striking against other object, initial encounter: Secondary | ICD-10-CM | POA: Insufficient documentation

## 2022-04-01 DIAGNOSIS — M7989 Other specified soft tissue disorders: Secondary | ICD-10-CM | POA: Diagnosis not present

## 2022-04-01 DIAGNOSIS — G20 Parkinson's disease: Secondary | ICD-10-CM | POA: Diagnosis not present

## 2022-04-01 DIAGNOSIS — I10 Essential (primary) hypertension: Secondary | ICD-10-CM

## 2022-04-01 DIAGNOSIS — S52614A Nondisplaced fracture of right ulna styloid process, initial encounter for closed fracture: Secondary | ICD-10-CM | POA: Diagnosis not present

## 2022-04-01 DIAGNOSIS — R21 Rash and other nonspecific skin eruption: Secondary | ICD-10-CM | POA: Insufficient documentation

## 2022-04-01 DIAGNOSIS — W19XXXA Unspecified fall, initial encounter: Secondary | ICD-10-CM

## 2022-04-01 DIAGNOSIS — M542 Cervicalgia: Secondary | ICD-10-CM | POA: Insufficient documentation

## 2022-04-01 DIAGNOSIS — R55 Syncope and collapse: Secondary | ICD-10-CM | POA: Insufficient documentation

## 2022-04-01 DIAGNOSIS — R0602 Shortness of breath: Secondary | ICD-10-CM | POA: Insufficient documentation

## 2022-04-01 DIAGNOSIS — N1831 Chronic kidney disease, stage 3a: Secondary | ICD-10-CM | POA: Diagnosis not present

## 2022-04-01 DIAGNOSIS — S52531A Colles' fracture of right radius, initial encounter for closed fracture: Secondary | ICD-10-CM

## 2022-04-01 LAB — COMPREHENSIVE METABOLIC PANEL
ALT: 44 U/L (ref 0–44)
AST: 35 U/L (ref 15–41)
Albumin: 4 g/dL (ref 3.5–5.0)
Alkaline Phosphatase: 101 U/L (ref 38–126)
Anion gap: 9 (ref 5–15)
BUN: 20 mg/dL (ref 6–20)
CO2: 24 mmol/L (ref 22–32)
Calcium: 8.7 mg/dL — ABNORMAL LOW (ref 8.9–10.3)
Chloride: 106 mmol/L (ref 98–111)
Creatinine, Ser: 1.78 mg/dL — ABNORMAL HIGH (ref 0.44–1.00)
GFR, Estimated: 32 mL/min — ABNORMAL LOW (ref >60)
Glucose, Bld: 99 mg/dL (ref 70–99)
Potassium: 4.1 mmol/L (ref 3.5–5.1)
Sodium: 139 mmol/L (ref 135–145)
Total Bilirubin: 0.7 mg/dL (ref 0.3–1.2)
Total Protein: 7.1 g/dL (ref 6.5–8.1)

## 2022-04-01 LAB — CBC WITH DIFFERENTIAL/PLATELET
Abs Immature Granulocytes: 0.04 10*3/uL (ref 0.00–0.07)
Basophils Absolute: 0.1 10*3/uL (ref 0.0–0.1)
Basophils Relative: 1 %
Eosinophils Absolute: 0.4 10*3/uL (ref 0.0–0.5)
Eosinophils Relative: 4 %
HCT: 39.9 % (ref 36.0–46.0)
Hemoglobin: 12.6 g/dL (ref 12.0–15.0)
Immature Granulocytes: 0 %
Lymphocytes Relative: 17 %
Lymphs Abs: 1.8 10*3/uL (ref 0.7–4.0)
MCH: 28.1 pg (ref 26.0–34.0)
MCHC: 31.6 g/dL (ref 30.0–36.0)
MCV: 88.9 fL (ref 80.0–100.0)
Monocytes Absolute: 0.6 10*3/uL (ref 0.1–1.0)
Monocytes Relative: 6 %
Neutro Abs: 7.3 10*3/uL (ref 1.7–7.7)
Neutrophils Relative %: 72 %
Platelets: 333 10*3/uL (ref 150–400)
RBC: 4.49 MIL/uL (ref 3.87–5.11)
RDW: 13.8 % (ref 11.5–15.5)
WBC: 10.1 10*3/uL (ref 4.0–10.5)
nRBC: 0 % (ref 0.0–0.2)

## 2022-04-01 LAB — TROPONIN I (HIGH SENSITIVITY)
Troponin I (High Sensitivity): 21 ng/L — ABNORMAL HIGH (ref <18)
Troponin I (High Sensitivity): 27 ng/L — ABNORMAL HIGH (ref <18)

## 2022-04-01 LAB — BRAIN NATRIURETIC PEPTIDE: B Natriuretic Peptide: 27.2 pg/mL (ref 0.0–100.0)

## 2022-04-01 MED ORDER — SPIRIVA HANDIHALER 18 MCG IN CAPS: 18.0000 ug | ORAL_CAPSULE | Freq: Every day | RESPIRATORY_TRACT | 2 refills | Status: DC

## 2022-04-01 MED ORDER — FENTANYL CITRATE PF 50 MCG/ML IJ SOSY
50.0000 ug | PREFILLED_SYRINGE | Freq: Once | INTRAMUSCULAR | Status: AC
  Administered 2022-04-01: 50 ug via INTRAVENOUS
  Filled 2022-04-01: qty 1

## 2022-04-01 MED ORDER — SODIUM CHLORIDE 0.9 % IV BOLUS
1000.0000 mL | Freq: Once | INTRAVENOUS | Status: AC
  Administered 2022-04-01: 1000 mL via INTRAVENOUS

## 2022-04-01 MED ORDER — PREGABALIN 25 MG PO CAPS: 25.0000 mg | ORAL_CAPSULE | Freq: Two times a day (BID) | ORAL | 2 refills | Status: DC

## 2022-04-01 NOTE — ED Provider Notes (Signed)
4:41 PM I was notified by nursing that the patient had wanted to go home and declined admission.  I had a long discussion with the patient's about her symptoms and her work-up.  She has had a slowly escalating troponin with shortness of breath on exertion which could be concerning for a possible MI or other concerning underlying pathology.  I discussed with her and had a repeat back to me that this is something that could kill her or leave her permanently disabled.  She stated understanding and would return for any worsening.  She agreed to call her family doctor in the morning.  She has a distal radius fracture and is in splints.  She is chronically on narcotics.  I will hold off on prescribing her more from the hospital setting and suggested that she calls whoever manages that for her as an outpatient and see what they recommended for acute pain. ? ?  ?Melene Plan, DO ?04/01/22 1642 ? ?

## 2022-04-01 NOTE — ED Triage Notes (Signed)
Pt reports fall last night and now states R wrist pain and R sided neck pain. GCS 15. Denies blood thinners, - LOC . Pt states "ive been falling a lot" pt unsure if mechanical/dizzy fall. Pt states fall from standing and did hit head on door frame.  ?

## 2022-04-01 NOTE — Discharge Summary (Signed)
?Physician Discharge Summary ?  ?Patient: Deanna Yang MRN: [id number] DOB: 1961-10-21  ?Admit date:     04/01/2022  ?Discharge date: 04/01/22  ?Discharge Physician: Emeline Generaling T Gerell Fortson  ? ?PCP: Eartha InchBadger, Michael C, MD  ? ?Recommendations at discharge:  ? ?Follow-up with orthopedic surgery in 2 weeks ?Renal ultrasound in 1 week and then follow-up with PCP/nephrology ?Follow-up with pain management doctor in 1 week to discuss about cutting down narcotics and Lyrica dosages ? ?Discharge Diagnoses: ? ?Frequent falls likely secondary to polypharmacy ?Right wrist nondisplaced fracture ?Uncontrolled COPD ? ?Hospital Course: ? ?Patient came to ED for frequent falls, unsteady gait, dizziness for 3+ month.  Work-up showed patient likely has polypharmacy with worsening of her baseline kidney function interaction with high-dose narcotics and high-dose of Lyrica/gabapentin caused her neuro symptoms.  Recommend cut down Lyrica and narcotics, please refer to H&P for details. ? ?Assessment and Plan: ? ?Cut down Lyrica narcotic dosage ?Reevaluate. ? ?Recommend renal ultrasound and set up care with nephrology for follow-up of her CKD ? ?Patient did not tolerate Trelegy, start patient on Spiriva. ? ?  ? ? ?Consultants: None ?Procedures performed: None ?Disposition: Home ?Diet recommendation:  ?Regular diet ?DISCHARGE MEDICATION: ?Allergies as of 04/01/2022   ? ?   Reactions  ? Ambien [zolpidem Tartrate] Shortness Of Breath, Swelling  ? Tongue swelling, throat swelling.  ? Beta Adrenergic Blockers Anaphylaxis  ? Fish Allergy Shortness Of Breath, Rash  ? Iodinated Contrast Media Itching  ? Benadryl 50MG  prophylaxis before and after and patient reports she did fine  ? Latex Itching, Other (See Comments)  ? Blisters skin  ? Metoprolol Other (See Comments)  ? ANY MEDICATION THAT ENDS IN -OLOL-   The patient is asthmatic.  ? Penicillins Shortness Of Breath, Rash, Other (See Comments)  ? Has patient had a PCN reaction causing immediate rash,  facial/tongue/throat swelling, SOB or lightheadedness with hypotension: Yes ?Has patient had a PCN reaction causing severe rash involving mucus membranes or skin necrosis: Unknown ?Has patient had a PCN reaction that required hospitalization No ?Has patient had a PCN reaction occurring within the last 10 years: No ?If all of the above answers are "NO", then may proceed with Cephalosporin use.  ? Red Dye Hives, Shortness Of Breath  ? Shellfish Allergy Anaphylaxis  ? Sulfa Antibiotics Hives, Shortness Of Breath  ? Amoxicillin-pot Clavulanate Nausea And Vomiting  ? Aspirin Other (See Comments)  ? Blisters on tongue from higher doses - tolerates 81 mg  ? Dye Fdc Red  [amaranth (fd&c Red #2)] Hives  ? X ray DYE  (BENADRYL 50 MG PROPHYLAXIS AND AFTER AND  SHE DID FINE, PER PATIENT.).  ? Tape Dermatitis, Rash  ? Adhesive  ? Talwin [pentazocine] Other (See Comments)  ? Unknown reaction  ? Levofloxacin Rash, Other (See Comments)  ? headache  ? Seroquel [quetiapine Fumarate] Other (See Comments)  ? hallucinate  ? ?  ? ?  ?Medication List  ?  ? ?STOP taking these medications   ? ?budesonide-formoterol 160-4.5 MCG/ACT inhaler ?Commonly known as: SYMBICORT ?  ?diphenhydrAMINE 50 MG/ML injection ?Commonly known as: BENADRYL ?  ?gabapentin 600 MG tablet ?Commonly known as: NEURONTIN ?  ?magnesium oxide 400 (241.3 Mg) MG tablet ?Commonly known as: MAG-OX ?  ?senna 8.6 MG Tabs tablet ?Commonly known as: SENOKOT ?  ?sorbitol 70 % Soln ?  ? ?  ? ?TAKE these medications   ? ?acyclovir ointment 5 % ?Commonly known as: ZOVIRAX ?Apply 1 application topically every 3 (three)  hours as needed (mouth sores). ?  ?albuterol 108 (90 Base) MCG/ACT inhaler ?Commonly known as: VENTOLIN HFA ?Inhale 2 puffs into the lungs every 4 (four) hours as needed for wheezing. ?  ?amLODipine 10 MG tablet ?Commonly known as: NORVASC ?Take 10 mg by mouth daily. ?  ?bisacodyl 10 MG suppository ?Commonly known as: DULCOLAX ?Place 1 suppository (10 mg total)  rectally daily as needed for moderate constipation. ?  ?buPROPion 300 MG 24 hr tablet ?Commonly known as: WELLBUTRIN XL ?Take 1 tablet (300 mg total) by mouth daily. ?  ?busPIRone 15 MG tablet ?Commonly known as: BUSPAR ?Take 15 mg by mouth 2 (two) times daily as needed. ?  ?chlorhexidine 0.12 % solution ?Commonly known as: PERIDEX ?15 mLs by Mouth Rinse route as needed. ?  ?fluticasone 50 MCG/ACT nasal spray ?Commonly known as: FLONASE ?Place 1 spray into both nostrils 2 (two) times daily. ?  ?hydrOXYzine 25 MG tablet ?Commonly known as: ATARAX ?Take 1 tablet (25 mg total) by mouth every 6 (six) hours as needed for anxiety (mild anxiety). ?What changed: reasons to take this ?  ?Immune Globulin 10% 10 GM/100ML Soln ?Inject into the vein every 30 (thirty) days. ?  ?ipratropium-albuterol 0.5-2.5 (3) MG/3ML Soln ?Commonly known as: DUONEB ?Inhale 3 mLs into the lungs every 4 (four) hours as needed for wheezing or shortness of breath. ?  ?ketoconazole 2 % cream ?Commonly known as: NIZORAL ?Apply 1 application. topically as needed. ?  ?Linzess 145 MCG Caps capsule ?Generic drug: linaclotide ?Take 145 mcg by mouth daily. ?  ?montelukast 10 MG tablet ?Commonly known as: SINGULAIR ?Take 10 mg by mouth at bedtime. ?  ?morphine 30 MG 12 hr tablet ?Commonly known as: MS CONTIN ?Take 1 tablet (30 mg total) by mouth every 12 (twelve) hours. ?  ?nystatin powder ?Generic drug: nystatin ?Apply 1 application topically 3 (three) times daily as needed (blistering sores under breast). ?  ?oxyCODONE-acetaminophen 10-325 MG tablet ?Commonly known as: PERCOCET ?Take 1 tablet by mouth every 6 (six) hours as needed for pain. Hold for sedation ?  ?pantoprazole 40 MG tablet ?Commonly known as: PROTONIX ?Take 40 mg by mouth every morning. ?  ?prazosin 2 MG capsule ?Commonly known as: MINIPRESS ?Take 2 mg by mouth at bedtime. ?  ?pregabalin 25 MG capsule ?Commonly known as: Lyrica ?Take 1 capsule (25 mg total) by mouth 2 (two) times  daily. ?What changed:  ?medication strength ?how much to take ?when to take this ?  ?promethazine 25 MG tablet ?Commonly known as: PHENERGAN ?Take 25 mg by mouth every 8 (eight) hours as needed. ?  ?QUEtiapine 25 MG tablet ?Commonly known as: SEROQUEL ?Take 75 mg by mouth at bedtime. ?  ?Spiriva HandiHaler 18 MCG inhalation capsule ?Generic drug: tiotropium ?Place 1 capsule (18 mcg total) into inhaler and inhale daily. ?  ?tiZANidine 4 MG tablet ?Commonly known as: ZANAFLEX ?Take 8 mg by mouth 3 (three) times daily. ?  ?Trelegy Ellipta 100-62.5-25 MCG/ACT Aepb ?Generic drug: Fluticasone-Umeclidin-Vilant ?Inhale 1 puff into the lungs daily. ?  ? ?  ? ? ?Discharge Exam: ?Filed Weights  ? 04/01/22 1018  ?Weight: 99.8 kg  ? ? ? ?Condition at discharge: Good ? ?The results of significant diagnostics from this hospitalization (including imaging, microbiology, ancillary and laboratory) are listed below for reference.  ? ?Imaging Studies: ?DG Wrist Complete Right ? ?Result Date: 04/01/2022 ?CLINICAL DATA:  Trauma to wrist.  Larey Seat.  Swelling. EXAM: RIGHT WRIST - COMPLETE 3+ VIEW COMPARISON:  None. FINDINGS:  There is curvilinear lucency and minimal 2 mm cortical step-off at the lateral aspect of the distal radial metaphysis, a nondisplaced fracture that extends to the distal radial articular surface and also extends through the far medial distal radial metaphyseal cortex. On lateral view, there is up to 2.5 mm diastasis of the distal dorsal radial fracture line. Severe thumb carpometacarpal joint space narrowing with multiple peripheral osteophytes and degenerative ossicles. Moderate triscaphe joint space narrowing. Nondisplaced acute fracture of the base of the ulnar styloid. IMPRESSION:: IMPRESSION: 1. Acute intra-articular fracture of the distal radial styloid extending through the dorsal, lateral, and medial cortical surfaces as well as the distal lateral radial articular surface. 2. Nondisplaced acute fracture of the base  of the ulnar styloid. 3. Severe thumb carpometacarpal osteoarthritis. Electronically Signed   By: Neita Garnet M.D.   On: 04/01/2022 10:54  ? ?CT Head Wo Contrast ? ?Result Date: 04/01/2022 ?CLINICAL DATA:  Head trauma, abnorma

## 2022-04-01 NOTE — ED Notes (Signed)
Patient transported to X-ray 

## 2022-04-01 NOTE — ED Notes (Signed)
Ortho tech paged  

## 2022-04-01 NOTE — Discharge Instructions (Addendum)
It was recommended that you come to the hospital for your difficulty breathing and your instability when you walk and your shortness of breath on exertion.  You are choosing to go home.  As we discussed in the room this is something that could be much more severe that we would be able to more closely monitor if we kept you in the hospital.  This something that could kill you or leave the permanently disabled.  Please call your doctor tomorrow and let them know how you are doing.  At any point you wish please return to the hospital for repeat evaluation. ? ?You have broken your wrist.  This was discussed with the hand surgeon.  I provided his name and information in this paperwork for you to call him and set up an appointment. ?

## 2022-04-01 NOTE — ED Provider Notes (Cosign Needed)
[facility] EMERGENCY DEPARTMENT Provider Note   CSN: [id number] Arrival date & time: 04/01/22  1001     History  Chief Complaint  Patient presents with   Deanna Yang is a 61 y.o. female who presents to the ED after fall last night. 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 She states that her balance has been very poor and she has been having increased falls recently.  She states that last night she lost her footing, fell, slid on the floor and then hit her head against the door frame cracking her neck to her the right side.  She complains of pain on the right side of her neck and severe pain in her right wrist.  She has a history of previous cervical surgery with "a neck rod" in place.  She is right-hand dominant.  She denies loss of consciousness.  She is not on any blood thinners.  Patient has significant tremor.  She states that this is worsened recently.  She denies shortness of breath, syncope or near syncope, melena, hematochezia.  She does not take any blood thinners.  Patient reports that recently she has had increasingly worsening mental health issues that includes increased audiovisual hallucinations hallucination has been unable to follow-up with her psychiatric care provider for medication adjustments.  Fall      Home Medications Prior to Admission medications   Medication Sig Start Date End Date Taking? Authorizing Provider  acyclovir ointment (ZOVIRAX) 5 % Apply 1 application topically every 3 (three) hours as needed (mouth sores).  01/27/18   [provider]  albuterol (PROAIR HFA) 108 (90 Base) MCG/ACT inhaler Inhale 2 puffs into the lungs every 4 (four) hours as needed for wheezing. 06/30/17   [provider]  amLODipine (NORVASC) 10 MG tablet Take 10 mg by mouth daily.  07/05/17   [provider]   bisacodyl (DULCOLAX) 10 MG suppository Place 1 suppository (10 mg total) rectally daily as needed for moderate constipation. 08/09/18   Joseph Art, DO  budesonide-formoterol (SYMBICORT) 160-4.5 MCG/ACT inhaler Inhale 2 puffs into the lungs 2 (two) times daily.    [provider]  buPROPion (WELLBUTRIN XL) 300 MG 24 hr tablet Take 1 tablet (300 mg total) by mouth daily. 01/09/18   Thresa Ross, MD  fluticasone (FLONASE) 50 MCG/ACT nasal spray Place 1 spray into both nostrils 2 (two) times daily. 05/31/18   [provider]  gabapentin (NEURONTIN) 600 MG tablet Take 1,800 mg by mouth at bedtime.  05/03/18   [provider]  hydrOXYzine (ATARAX/VISTARIL) 25 MG tablet Take 1 tablet (25 mg total) by mouth every 6 (six) hours as needed for anxiety (mild anxiety). Patient taking differently: Take 25 mg by mouth every 6 (six) hours as needed for anxiety (panic attacks).  07/24/17   Shaune Pollack, MD  Immune Globulin 10% [facility]) 10 GM/100ML SOLN Inject into the vein every 30 (thirty) days.    [provider]  ipratropium-albuterol (DUONEB) 0.5-2.5 (3) MG/3ML SOLN Inhale 3 mLs into the lungs every 4 (four) hours as needed for wheezing or shortness of breath. 04/15/17   [provider]  magnesium oxide (MAG-OX) 400 (241.3 Mg) MG tablet Take 1 tablet (400 mg total) by mouth 2 (two) times daily. 08/09/18   Joseph Art, DO  montelukast (SINGULAIR) 10 MG tablet Take 10 mg by mouth at bedtime.  05/01/18   [provider]  morphine (MS CONTIN) 30 MG 12 hr tablet Take 1 tablet (30 mg total) by mouth every 12 (twelve) hours. 04/28/17   Narda Bonds, MD  NYSTATIN powder Apply 1 application topically 3 (three) times daily as needed (blistering sores under breast).  05/23/17   [provider]  oxyCODONE-acetaminophen (PERCOCET) 10-325 MG tablet Take 1 tablet by mouth every 6 (six) hours as needed for pain. Hold for sedation 08/09/18   Marlin Canary U, DO   prazosin (MINIPRESS) 2 MG capsule Take 2 mg by mouth at bedtime.  05/03/18   [provider]  QUEtiapine (SEROQUEL) 25 MG tablet Take 12.5-50 mg by mouth See admin instructions. Take 1/2 -1 tablet (12.5-25 mg) by mouth up to twice daily for anxiety and hallucinations, take 2 tablets (50 mg) at bedtime for sleep 08/06/18   [provider]  senna (SENOKOT) 8.6 MG TABS tablet Take 1 tablet (8.6 mg total) by mouth 2 (two) times daily. 08/09/18   Joseph Art, DO  sorbitol 70 % SOLN Take 15 mLs by mouth daily as needed for moderate constipation. 08/09/18   Joseph Art, DO  amphetamine-dextroamphetamine (ADDERALL) 30 MG tablet Take 30 mg by mouth 2 (two) times daily.  10/02/15  [provider]  FLUoxetine (PROZAC) 40 MG capsule Take 1 capsule (40 mg total) by mouth daily. 10/02/15 01/10/17  Thresa Ross, MD  pregabalin (LYRICA) 225 MG capsule Take 225 mg by mouth 2 (two) times daily.  01/10/17  [provider]      Allergies    Ambien [zolpidem tartrate], Beta adrenergic blockers, Fish allergy, Iodinated contrast media, Latex, Metoprolol, Penicillins, Red dye, Shellfish allergy, Sulfa antibiotics, Amoxicillin-pot clavulanate, Aspirin, Dye fdc red  [amaranth (fd&c red #2)], Tape, Talwin [pentazocine], Levofloxacin, and Seroquel [quetiapine fumarate]    Review of Systems   Review of Systems    Physical Exam Updated Vital Signs BP 106/79 (BP Location: Left Arm)   Pulse 95   Temp 97.9 F (36.6 C) (Oral)   Resp 17   Ht 5\' 7"  (1.702 m)   Wt 99.8 kg   SpO2 95%   BMI 34.46 kg/m  Physical Exam Vitals and nursing note reviewed.  Constitutional:      General: She is not in acute distress.    Appearance: She is well-developed. She is not diaphoretic.  HENT:     Head: Normocephalic and atraumatic.     Right Ear: External ear normal.     Left Ear: External ear normal.     Nose: Nose normal.     Mouth/Throat:     Mouth: Mucous membranes are moist.  Eyes:      General: No scleral icterus.    Extraocular Movements: Extraocular movements intact.     Conjunctiva/sclera: Conjunctivae normal.     Pupils: Pupils are equal, round, and reactive to [place].  Cardiovascular:     Rate and Rhythm: Normal rate and regular rhythm.     Heart sounds: Normal heart sounds. No murmur heard.   No friction rub. No gallop.  Pulmonary:     Effort: Pulmonary effort is normal. No respiratory distress.     Breath sounds: Normal breath sounds.  Abdominal:     General: Bowel sounds are normal. There is no distension.     Palpations: Abdomen is soft. There is no mass.     Tenderness: There is no abdominal tenderness. There is no guarding.  Musculoskeletal:     Cervical  back: Normal range of motion.     Right lower leg: No edema.     Left lower leg: No edema.     Comments: Right wrist with obvious deformity, swelling, tenderness and bruising over the dorsal aspect.  Radial pulse 2+, normal sensation and cap refill in the fingers.  She is able to wiggle her fingers.  Skin:    General: Skin is warm and dry.     Findings: Rash present.     Comments: Beefy red, macerated rash under bilateral breasts  Neurological:     Mental Status: She is alert and oriented to person, place, and time.  Psychiatric:        Behavior: Behavior normal.    ED Results / Procedures / Treatments   Labs (all labs ordered are listed, but only abnormal results are displayed) Labs Reviewed - No data to display  EKG None  Radiology No results found.  Procedures Procedures    Medications Ordered in ED Medications - No data to display  ED Course/ Medical Decision Making/ A&P Clinical Course as of 04/01/22 1453  Fri Apr 01, 2022  1022 EKG shows prolonged QT [AH]  1133 CT Head Wo Contrast [AH]  1133 CT Cervical Spine Wo Contrast I personally visualized th CT head and Cspine which shows no acute abnormality.  Agree with radiologic interpretation [AH]  1135 DG Wrist Complete Right I  personally visualized images of the right wrist x-ray which shows intra-articular fracture of the distal radius and ulnar styloid process. I discussed the case with PA Earney Hamburg covering for orthopedics who recommends sugar-tong splint and outpatient follow-up with Dr. Merlyn Lot next week. [AH]  1252 On reevaluation of the patient after splinting she is neurovascularly intact and feels more comfortable. I performed a secondary neurologic exam.  Patient has a significant intention tremor and dysmetria with finger-to-nose although I was only able to test the left arm.  Secondarily when she test strength or lift her legs off the table she becomes extremely tremorous.  [AH]  1253 Patient admits that she has been feeling short of breath especially with exertion or lying flat for about 3 months and has noticed swelling in her ankles.  Her mother bought her a sleep number bed so that she could sleep upright.  She states that she has episodes where she feels like she is going to pass out and gets extremely sweaty whenever she gets up to walk. [AH]  1254 I ambulated the patient and her balance was extremely bad and she required assistance whenever she turned around to return to the room and had to catch the wall in order not to fall down.  Patient was also quite winded by the time we returned after walking approximately 20 feet. [AH]  1255 DG Chest Port 1 View I personally visualized a 1 view chest x-ray there is no evidence of pulmonary edema on exam. [AH]    Clinical Course User Index [AH] Arthor Captain, PA-C                           Medical Decision Making Patient here with falls, weakness, sob, exertional dyspnea, near syncope. Patient evaluated for fall with CT head, C-spine both of which are negative and x-ray of the right wrist which showed fracture which was stabilized with splinting.  Patient having a multitude of issues today including fatigue and weakness.  Patient may have a cord compression,  metabolic cause although she  does not have any obvious hyponatremia or hypokalemia.  I have also considered parkinsonism given her long-term use of antipsychotic medications.  This may be medication induced as well.  Patient has dysmetria and balance issues.  Secondarily patient is also having near syncope, shortness of breath, orthopnea, diaphoresis and increased lower extremity swelling patient may have new onset heart failure versus ACS.  I have discussed the case with Dr. Chipper Herb who will bring her in for observation admission for further work-up.  She appears otherwise stable for observation   Amount and/or Complexity of Data Reviewed Labs: ordered. Radiology: ordered and independent interpretation performed. Decision-making details documented in ED Course. ECG/medicine tests: ordered and independent interpretation performed. Decision-making details documented in ED Course.    Details: EKG shows sinus rhythm with prolonged QT  Risk Prescription drug management. Decision regarding hospitalization.           Final Clinical Impression(s) / ED Diagnoses Final diagnoses:  None    Rx / DC Orders ED Discharge Orders     None         Arthor Captain, PA-C 04/01/22 1530

## 2022-04-01 NOTE — H&P (Signed)
?History and Physical  ? ? ?Deanna Yang SVX:[id number] DOB: Sep 17, 1961 DOA: 04/01/2022 ? ?PCP: Eartha Inch, MD (Confirm with patient/family/NH records and if not entered, this has to be entered at [facility] point of entry) ?Patient coming from: Home ? ?I have personally briefly reviewed patient's old medical records in [facility] ? ?Chief Complaint: Shaky, unsteady gait and fall ? ?HPI: Deanna Yang is a 61 y.o. female with medical history significant of cervical spine radiculopathy and neuropathy with chronic narcotic dependence, COPD, HTN, CKD stage II, maniac disorder, presented with worsening of whole body shakiness, unsteady gait, frequent lightheadedness and frequent falls and nocturnal visual hallucination. ? ?Symptoms started 3+ months ago gradually getting worse, which included frequent feeling of lightheadedness, unsteady gait and occasional shakiness.  The above mentioned symptoms had caused her to fall frequently 2-3 times a week since January.  No head injury, no loss of consciousness. She denies any weakness or numbness of any of the limbs, no tingling sensations of any of the extremities.  She went to see her pain management doctor who changed her gabapentin to Lyrica 75 mg 3 times daily 1 week ago.  However she continued to experience above-mentioned symptoms, and today she felt lightheadedness and unsteady and fell down and hit her right wrist.  Patient also takes high-dose of narcotics of 30 mg of morphine twice daily and 10/325 oxycodone 4 times a day. ? ?Patient also reported that she has been having episodes of visual hallucinations in the evenings for several months, denies any suicidal or homicidal ideations.  There has been no changes to her mental health medications. ? ?Patient has uncontrolled COPD, she was ordered Trelegy by PCP however she reported that she could not tolerate Trelegy because of developed severe thrush in her mouth, instead she only takes as needed albuterol for  wheezing and cough.  She feels worsening of wheeze and shortness of breath lately, cannot tolerate only short distance walk compared to before.  No leg swelling no chest pains. ? ?ED Course: No hypotension no tachycardia, O2 saturation 94% on room air.  Ambulation test in ED showed patient very unsteady and shaky, and wheezy and cough. ? ?Creatinine 1.7 compared to 1.3 last year. ? ?X-ray showed acute intra-articular fracture of the distal radial styloid and nondisplaced acute fracture of the base of the ulnar styloid. ? ?Review of Systems: As per HPI otherwise 14 point review of systems negative.  ? ? ?Past Medical History:  ?Diagnosis Date  ? Anxiety   ? COPD (chronic obstructive pulmonary disease) (HCC)   ? Depression   ? Fatty liver 07/23/2018  ? Fibromyalgia   ? Hypertension   ? Hypertension   ? Manic depression (HCC)   ? Neuropathy   ? OSA (obstructive sleep apnea)   ? ? ?Past Surgical History:  ?Procedure Laterality Date  ? ABDOMINAL SURGERY    ? APPENDECTOMY    ? BACK SURGERY    ? BREAST REDUCTION SURGERY    ? BREAST SURGERY    ? CARPAL TUNNEL RELEASE Bilateral   ? CESAREAN SECTION    ? CHOLECYSTECTOMY    ? HERNIA REPAIR    ? JOINT REPLACEMENT    ? KNEE SURGERY    ? TONSILLECTOMY    ? ? ? reports that she quit smoking about 5 years ago. Her smoking use included cigarettes. She smoked an average of 1 pack per day. She has never used smokeless tobacco. She reports that she does not drink alcohol and  does not use drugs. ? ?Allergies  ?Allergen Reactions  ? Ambien [Zolpidem Tartrate] Shortness Of Breath and Swelling  ?  Tongue swelling, throat swelling. ?  ? Beta Adrenergic Blockers Anaphylaxis  ? Fish Allergy Shortness Of Breath and Rash  ? Iodinated Contrast Media Itching  ?  Benadryl 50MG  prophylaxis before and after and patient reports she did fine  ? Latex Itching and Other (See Comments)  ?  Blisters skin  ? Metoprolol Other (See Comments)  ?  ANY MEDICATION THAT ENDS IN -OLOL-   The patient is asthmatic.   ? Penicillins Shortness Of Breath, Rash and Other (See Comments)  ?  Has patient had a PCN reaction causing immediate rash, facial/tongue/throat swelling, SOB or lightheadedness with hypotension: Yes ?Has patient had a PCN reaction causing severe rash involving mucus membranes or skin necrosis: Unknown ?Has patient had a PCN reaction that required hospitalization No ?Has patient had a PCN reaction occurring within the last 10 years: No ?If all of the above answers are "NO", then may proceed with Cephalosporin use. ?  ? Red Dye Hives and Shortness Of Breath  ? Shellfish Allergy Anaphylaxis  ? Sulfa Antibiotics Hives and Shortness Of Breath  ? Amoxicillin-Pot Clavulanate Nausea And Vomiting  ? Aspirin Other (See Comments)  ?  Blisters on tongue from higher doses - tolerates 81 mg ?  ? Dye Fdc Red  [Amaranth (Fd&C Red #2)] Hives  ?  X ray DYE  (BENADRYL 50 MG PROPHYLAXIS AND AFTER AND  SHE DID FINE, PER PATIENT.).  ? Tape Dermatitis and Rash  ?  Adhesive ?  ? Talwin [Pentazocine] Other (See Comments)  ?  Unknown reaction  ? Levofloxacin Rash and Other (See Comments)  ?  headache  ? Seroquel [Quetiapine Fumarate] Other (See Comments)  ?  hallucinate  ? ? ?Family History  ?Problem Relation Age of Onset  ? Alcohol abuse Mother   ? Alcohol abuse Father   ? Depression Sister   ? Dementia Neg Hx   ? ? ? ?Prior to Admission medications   ?Medication Sig Start Date End Date Taking? Authorizing Provider  ?acyclovir ointment (ZOVIRAX) 5 % Apply 1 application topically every 3 (three) hours as needed (mouth sores).  01/27/18  Yes [provider]  ?albuterol (VENTOLIN HFA) 108 (90 Base) MCG/ACT inhaler Inhale 2 puffs into the lungs every 4 (four) hours as needed for wheezing. 06/30/17  Yes [provider]  ?amLODipine (NORVASC) 10 MG tablet Take 10 mg by mouth daily.  07/05/17  Yes [provider]  ?bisacodyl (DULCOLAX) 10 MG suppository Place 1 suppository (10 mg total) rectally daily as needed for  moderate constipation. 08/09/18  Yes 08/11/18 U, DO  ?buPROPion (WELLBUTRIN XL) 300 MG 24 hr tablet Take 1 tablet (300 mg total) by mouth daily. 01/09/18  Yes 01/11/18, MD  ?busPIRone (BUSPAR) 15 MG tablet Take 15 mg by mouth 2 (two) times daily as needed. 03/11/22  Yes [provider]  ?chlorhexidine (PERIDEX) 0.12 % solution 15 mLs by Mouth Rinse route as needed. 10/27/21  Yes [provider]  ?fluticasone (FLONASE) 50 MCG/ACT nasal spray Place 1 spray into both nostrils 2 (two) times daily. 05/31/18  Yes [provider]  ?hydrOXYzine (ATARAX/VISTARIL) 25 MG tablet Take 1 tablet (25 mg total) by mouth every 6 (six) hours as needed for anxiety (mild anxiety). ?Patient taking differently: Take 25 mg by mouth every 6 (six) hours as needed for anxiety (panic attacks). 07/24/17  Yes Isaacs,  Sheria Langameron, MD  ?Immune Globulin 10% (GAMMAGARD) 10 GM/100ML SOLN Inject into the vein every 30 (thirty) days.   Yes [provider]  ?ketoconazole (NIZORAL) 2 % cream Apply 1 application. topically as needed. 01/25/22  Yes [provider]  ?LINZESS 145 MCG CAPS capsule Take 145 mcg by mouth daily. 03/11/22  Yes [provider]  ?morphine (MS CONTIN) 30 MG 12 hr tablet Take 1 tablet (30 mg total) by mouth every 12 (twelve) hours. 04/28/17  Yes Narda BondsNettey, Ralph A, MD  ?oxyCODONE-acetaminophen (PERCOCET) 10-325 MG tablet Take 1 tablet by mouth every 6 (six) hours as needed for pain. Hold for sedation 08/09/18  Yes Joseph ArtVann, Jessica U, DO  ?pantoprazole (PROTONIX) 40 MG tablet Take 40 mg by mouth every morning. 03/11/22  Yes [provider]  ?prazosin (MINIPRESS) 2 MG capsule Take 2 mg by mouth at bedtime.  05/03/18  Yes [provider]  ?pregabalin (LYRICA) 25 MG capsule Take 1 capsule (25 mg total) by mouth 2 (two) times daily. 04/01/22 04/01/23 Yes Emeline GeneralZhang, Genny Caulder T, MD  ?promethazine (PHENERGAN) 25 MG tablet Take 25 mg by mouth every 8 (eight) hours as needed. 03/11/22  Yes  [provider]  ?tiotropium (SPIRIVA HANDIHALER) 18 MCG inhalation capsule Place 1 capsule (18 mcg total) into inhaler and inhale daily. 04/01/22 04/01/23 Yes Emeline GeneralZhang, Paiden Cavell T, MD  ?tiZANidine (ZANAFLEX) 4 MG

## 2022-04-01 NOTE — ED Notes (Signed)
Ortho tech at bedside 

## 2022-04-01 NOTE — ED Notes (Signed)
Pt updated on POC, stating admission. Pt reports 'i dont want to stay, I have a dog at home, if they want me to wear the heart monitor at home I will". RN notified MD Adela Lank about pt wanting to leave AMA, he stated will come see pt ? ?

## 2022-04-01 NOTE — ED Notes (Signed)
Pt ambulatory to restroom

## 2022-04-01 NOTE — Progress Notes (Signed)
Orthopedic Tech Progress Note ?Patient Details:  ?Deanna Yang ?Jul 22, 1961 ?[id number] ? ?Ortho Devices ?Type of Ortho Device: Sugartong splint, Sling immobilizer ?Ortho Device/Splint Location: RUE ?Ortho Device/Splint Interventions: Ordered, Application, Adjustment ?  ?Post Interventions ?Patient Tolerated: Well ?Instructions Provided: Care of device ? ?Deanna Yang ?04/01/2022, 12:15 PM ? ?

## 2022-04-05 ENCOUNTER — Other Ambulatory Visit: Payer: Self-pay | Admitting: Orthopedic Surgery

## 2022-04-06 ENCOUNTER — Encounter (HOSPITAL_COMMUNITY): Payer: Self-pay | Admitting: Orthopedic Surgery

## 2022-04-06 NOTE — Progress Notes (Signed)
Anesthesia Chart Review: ?Same-day work-up ? ?Patient seen at the ED on 04/01/2022 for mechanical fall resulting in right wrist nondisplaced fracture.  She was noted to have frequent falls with polypharmacy felt to be contributing.  During work-up she was noted to have mildly elevated troponin, 21 initially and 27 on recheck.  EKG showed NSR, prolonged QT interval.  Due to elevated troponin, ED MD recommended admission, however patient requested discharge. ? ?Review of records in care everywhere shows the patient follows with cardiology at [facility] for history of recurrent chest pain, felt to be noncardiac based on prior work-ups.  She was last seen 07/14/2021 in follow-up for ED visit for noncardiac chest pain.  Per note, "Chest Pain  ?This is a recurrent problem. The current episode started more than 1 year ago. The problem has been waxing and waning. The pain is present in the lateral region. The pain is mild. The quality of the pain is described as dull. The pain radiates to the left arm and left jaw. Associated symptoms include back pain, nausea and vomiting. Pertinent negatives include no exertional chest pressure, lower extremity edema, palpitations or shortness of breath. The pain is aggravated by nothing. She has tried rest for the symptoms. Risk factors include stress, smoking/tobacco exposure and post-menopausal.  Her past medical history is significant for anxiety/panic attacks and hypertension. Pertinent negatives for past medical history include no CAD and no diabetes. Pertinent negatives for family medical history include: no CAD. Prior diagnostic workup includes chest x-ray and echocardiogram."  Nuclear stress test was ordered as well as GI consult.  It does not appear that stress test was ever done. ? ?Follows with pulmonology at [facility] for history of COPD and moderate persistent asthma.  She is maintained on Breztri and as needed albuterol.  Supplemental oxygen uses inconsistently documented in chart.   Previous notes state that the patient uses 2 to 3 L nightly.  However this is not mentioned in recent pulmonology note.  Per last office visit note 07/26/2021, PFTs done that day showed severe obstruction. ? ?History of OSA on CPAP/BiPAP, patient states she does not use. ? ?CKD 3 ? ?History of C4-7 ACDF. ? ?CMP and CBC from ED visit 04/01/2022 reviewed, creatinine elevated at 1.78, labs otherwise unremarkable. ? ?Medical history reviewed with anesthesiologist Dr. Nance Pew.  He advised okay to proceed as planned barring acute status change.  Patient will need day of surgery evaluation. ? ?EKG 04/01/2022: Sinus rhythm.  Rate 96.  Prolonged QT (QTcB 524) ? ?TTE 09/22/2016: ?Interpretation Summary ?A complete portable two-dimensional transthoracic echocardiogram with color ?flow Doppler and Spectral Doppler was performed. The left ventricle is ?normal in size. ?There is normal left ventricular wall thickness. ?The left ventricular ejection fraction is normal (60-65%). ?The left ventricular wall motion is normal. ?The aortic valve is not well visualized, but is grossly normal. ?Estimation of right ventricular systolic pressure is not possible. ?The left ventricular diastolic function is normal. ? ? ? ?Deanna Poles, PA-C ?[facility] Short Stay Center/Anesthesiology ?Phone [phone] ?04/06/2022 4:15 PM ? ?

## 2022-04-06 NOTE — Anesthesia Preprocedure Evaluation (Addendum)
Anesthesia Evaluation  ?Patient identified by MRN, date of birth, ID band ?Patient awake ? ? ? ?Reviewed: ?Allergy & Precautions, NPO status , Patient's Chart, lab work & pertinent test results ? ?Airway ?Mallampati: II ? ?TM Distance: >3 FB ?Neck ROM: Full ? ? ? Dental ? ?(+) Teeth Intact, Dental Advisory Given ?  ?Pulmonary ?shortness of breath, lying and Long-Term Oxygen Therapy, sleep apnea , COPD, former smoker,  ?  ?Pulmonary exam normal ?breath sounds clear to auscultation ? ? ? ? ? ? Cardiovascular ?hypertension, Pt. on medications ?Normal cardiovascular exam ?Rhythm:Regular Rate:Normal ? ? ?  ?Neuro/Psych ? Headaches, PSYCHIATRIC DISORDERS Anxiety Depression Bipolar Disorder   ? GI/Hepatic ?Neg liver ROS, GERD  Medicated,  ?Endo/Other  ?negative endocrine ROS ? Renal/GU ?Renal InsufficiencyRenal disease  ? ?  ?Musculoskeletal ? ?(+) Arthritis , Fibromyalgia -DISPLACED INTRA-ARTICULAR FRACTURE RIGHT DISTAL RADIUS  ? Abdominal ?  ?Peds ? Hematology ?negative hematology ROS ?(+)   ?Anesthesia Other Findings ?Day of surgery medications reviewed with the patient. ? Reproductive/Obstetrics ? ?  ? ? ? ? ? ? ? ? ? ? ? ? ? ?  ?  ? ? ? ? ? ? ? ?Anesthesia Physical ?Anesthesia Plan ? ?ASA: 3 ? ?Anesthesia Plan: General  ? ?Post-op Pain Management: Tylenol PO (pre-op)* and Regional block*  ? ?Induction: Intravenous ? ?PONV Risk Score and Plan: 3 and Midazolam, Dexamethasone and Ondansetron ? ?Airway Management Planned: LMA ? ?Additional Equipment:  ? ?Intra-op Plan:  ? ?Post-operative Plan: Extubation in OR ? ?Informed Consent: I have reviewed the patients History and Physical, chart, labs and discussed the procedure including the risks, benefits and alternatives for the proposed anesthesia with the patient or authorized representative who has indicated his/her understanding and acceptance.  ? ? ? ?Dental advisory given ? ?Plan Discussed with:  ? ?Anesthesia Plan Comments: (PAT note by  Antionette Poles, PA-C: ?Patient seen at the ED on 04/01/2022 for mechanical fall resulting in right wrist nondisplaced fracture.  She was noted to have frequent falls with polypharmacy felt to be contributing.  During work-up she was noted to have mildly elevated troponin, 21 initially and 27 on recheck.  EKG showed NSR, prolonged QT interval.  Due to elevated troponin, ED MD recommended admission, however patient requested discharge. ? ?Review of records in care everywhere shows the patient follows with cardiology at [facility] for history of recurrent chest pain, felt to be noncardiac based on prior work-ups.  She was last seen 07/14/2021 in follow-up for ED visit for noncardiac chest pain.  Per note, "Chest Pain  ?This is a recurrent problem. The current episode started more than 1 year ago. The problem has been waxing and waning. The pain is present in the lateral region. The pain is mild. The quality of the pain is described as dull. The pain radiates to the left arm and left jaw. Associated symptoms include back pain, nausea and vomiting. Pertinent negatives include no exertional chest pressure, lower extremity edema, palpitations or shortness of breath. The pain is aggravated by nothing. She has tried rest for the symptoms. Risk factors include stress, smoking/tobacco exposure and post-menopausal.  Her past medical history is significant for anxiety/panic attacks and hypertension. Pertinent negatives for past medical history include no CAD and no diabetes. Pertinent negatives for family medical history include: no CAD. Prior diagnostic workup includes chest x-ray and echocardiogram."  Nuclear stress test was ordered as well as GI consult.  It does not appear that stress test was ever done. ? ?  Follows with pulmonology at [facility] for history of COPD and moderate persistent asthma.  She is maintained on Breztri and as needed albuterol.  Supplemental oxygen uses inconsistently documented in chart.  Previous notes state that  the patient uses 2 to 3 L nightly.  However this is not mentioned in recent pulmonology note.  Per last office visit note 07/26/2021, PFTs done that day showed severe obstruction. ? ?History of OSA on CPAP/BiPAP, patient states she does not use. ? ?CKD 3 ? ?History of C4-7 ACDF. ? ?CMP and CBC from ED visit 04/01/2022 reviewed, creatinine elevated at 1.78, labs otherwise unremarkable. ? ?Medical history reviewed with anesthesiologist Dr. Nance Pew.  He advised okay to proceed as planned barring acute status change.  Patient will need day of surgery evaluation. ? ?EKG 04/01/2022: Sinus rhythm.  Rate 96.  Prolonged QT (QTcB 524) ? ?TTE 09/22/2016: ?Interpretation Summary ?A complete portable two-dimensional transthoracic echocardiogram with color ?flow Doppler and Spectral Doppler was performed. The left ventricle is ?normal in size. ?There is normal left ventricular wall thickness. ?The left ventricular ejection fraction is normal (60-65%). ?The left ventricular wall motion is normal. ?The aortic valve is not well visualized, but is grossly normal. ?Estimation of right ventricular systolic pressure is not possible. ?The left ventricular diastolic function is normal. ?)  ? ? ? ? ?Anesthesia Quick Evaluation ? ?

## 2022-04-06 NOTE — Progress Notes (Signed)
error 

## 2022-04-06 NOTE — Progress Notes (Signed)
Patient's chart reviewed by Dr Tacy Dura. Patient fell on 04-01-22 and went to ED. She had elevated troponin levels and SOB and the ED doctor wanted her admitted. She saw the hospitalist and said she did not want to stay and signed out AMA. Due to leaving before cardiac workup, pt will need to be done at [facility]. Steward Drone at Dr Merrilee Seashore office notified. ?

## 2022-04-06 NOTE — Progress Notes (Signed)
TWO VISITORS ARE ALLOWED TO COME WITH YOU AND STAY IN THE SURGICAL WAITING ROOM ONLY DURING PRE OP AND PROCEDURE DAY OF SURGERY.  ? ?PCP - Aleatha Borer, NP ?Cardiologist - n/a ?Pulmonology - Gery Pray, FNP ? ?Chest x-ray - 04/01/22 (1V) ?EKG - 04/01/22 ?Stress Test - n/a ?ECHO - n/a ?Cardiac Cath - n/a ? ?ICD Pacemaker/Loop - n/a ? ?Sleep Study -  Yes ?CPAP - does not use CPAP ? ?ERAS: Clear liquids til 9:30 AM DOS. ? ?Anesthesia review: Yes ? ?STOP now taking any Aspirin (unless otherwise instructed by your surgeon), Aleve, Naproxen, Ibuprofen, Motrin, Advil, Goody's, BC's, all herbal medications, fish oil, and all vitamins.  ? ?Coronavirus Screening ?Do you have any of the following symptoms:  ?Cough yes/no: No ?Fever (>100.43F)  yes/no: No ?Runny nose yes/no: No ?Sore throat yes/no: No ?Difficulty breathing/shortness of breath  yes/no: No ? ?Have you traveled in the last 14 days and where? yes/no: No ? ?Patient verbalized understanding of instructions that were given via phone. ?

## 2022-04-07 ENCOUNTER — Ambulatory Visit (HOSPITAL_COMMUNITY): Payer: 59 | Admitting: Anesthesiology

## 2022-04-07 ENCOUNTER — Encounter (HOSPITAL_COMMUNITY)
Admission: RE | Disposition: A | Discharge status: Home / Self Care | Payer: Self-pay | Source: Ambulatory Visit | Attending: Orthopedic Surgery

## 2022-04-07 ENCOUNTER — Ambulatory Visit (HOSPITAL_BASED_OUTPATIENT_CLINIC_OR_DEPARTMENT_OTHER): Payer: 59 | Admitting: Anesthesiology

## 2022-04-07 ENCOUNTER — Other Ambulatory Visit: Payer: Self-pay

## 2022-04-07 ENCOUNTER — Encounter (HOSPITAL_COMMUNITY): Payer: Self-pay | Admitting: Orthopedic Surgery

## 2022-04-07 ENCOUNTER — Ambulatory Visit (HOSPITAL_COMMUNITY)
Admission: RE | Admit: 2022-04-07 | Discharge: 2022-04-07 | Disposition: A | Discharge status: Home / Self Care | Payer: 59 | Source: Ambulatory Visit | Attending: Orthopedic Surgery | Admitting: Orthopedic Surgery

## 2022-04-07 DIAGNOSIS — Z87891 Personal history of nicotine dependence: Secondary | ICD-10-CM | POA: Insufficient documentation

## 2022-04-07 DIAGNOSIS — W1830XA Fall on same level, unspecified, initial encounter: Secondary | ICD-10-CM | POA: Diagnosis not present

## 2022-04-07 DIAGNOSIS — S52571A Other intraarticular fracture of lower end of right radius, initial encounter for closed fracture: Secondary | ICD-10-CM | POA: Insufficient documentation

## 2022-04-07 DIAGNOSIS — J449 Chronic obstructive pulmonary disease, unspecified: Secondary | ICD-10-CM

## 2022-04-07 DIAGNOSIS — F418 Other specified anxiety disorders: Secondary | ICD-10-CM

## 2022-04-07 DIAGNOSIS — G4730 Sleep apnea, unspecified: Secondary | ICD-10-CM | POA: Insufficient documentation

## 2022-04-07 DIAGNOSIS — I10 Essential (primary) hypertension: Secondary | ICD-10-CM | POA: Diagnosis not present

## 2022-04-07 HISTORY — DX: Pneumonia, unspecified organism: J18.9

## 2022-04-07 HISTORY — DX: Gastro-esophageal reflux disease without esophagitis: K21.9

## 2022-04-07 HISTORY — PX: FLEXOR TENOTOMY: SHX6342

## 2022-04-07 HISTORY — DX: Chronic kidney disease, unspecified: N18.9

## 2022-04-07 HISTORY — DX: Dyspnea, unspecified: R06.00

## 2022-04-07 HISTORY — DX: Unspecified osteoarthritis, unspecified site: M19.90

## 2022-04-07 HISTORY — PX: OPEN REDUCTION INTERNAL FIXATION (ORIF) DISTAL RADIAL FRACTURE: SHX5989

## 2022-04-07 HISTORY — DX: Herpesviral infection, unspecified: B00.9

## 2022-04-07 HISTORY — DX: Headache, unspecified: R51.9

## 2022-04-07 SURGERY — OPEN REDUCTION INTERNAL FIXATION (ORIF) DISTAL RADIUS FRACTURE
Anesthesia: Monitor Anesthesia Care | Site: Wrist | Laterality: Right

## 2022-04-07 MED ORDER — MIDAZOLAM HCL 2 MG/2ML IJ SOLN: 2.0000 mg | Freq: Once | INTRAMUSCULAR | Status: AC

## 2022-04-07 MED ORDER — FENTANYL CITRATE (PF) 100 MCG/2ML IJ SOLN: 50.0000 ug | Freq: Once | INTRAMUSCULAR | Status: AC

## 2022-04-07 MED ORDER — DEXMEDETOMIDINE (PRECEDEX) IN NS 20 MCG/5ML (4 MCG/ML) IV SYRINGE
PREFILLED_SYRINGE | INTRAVENOUS | Status: DC | PRN
  Administered 2022-04-07 (×4): 4 ug via INTRAVENOUS

## 2022-04-07 MED ORDER — PROPOFOL 10 MG/ML IV BOLUS
INTRAVENOUS | Status: AC
  Filled 2022-04-07: qty 20

## 2022-04-07 MED ORDER — VANCOMYCIN HCL IN DEXTROSE 1-5 GM/200ML-% IV SOLN: 1000.0000 mg | INTRAVENOUS | Status: AC

## 2022-04-07 MED ORDER — MIDAZOLAM HCL 2 MG/2ML IJ SOLN
INTRAMUSCULAR | Status: AC
  Administered 2022-04-07: 2 mg via INTRAVENOUS
  Filled 2022-04-07: qty 2

## 2022-04-07 MED ORDER — FENTANYL CITRATE (PF) 100 MCG/2ML IJ SOLN
INTRAMUSCULAR | Status: AC
  Administered 2022-04-07: 50 ug via INTRAVENOUS
  Filled 2022-04-07: qty 2

## 2022-04-07 MED ORDER — FENTANYL CITRATE (PF) 100 MCG/2ML IJ SOLN: 25.0000 ug | INTRAMUSCULAR | Status: DC | PRN

## 2022-04-07 MED ORDER — CHLORHEXIDINE GLUCONATE 0.12 % MT SOLN
15.0000 mL | Freq: Once | OROMUCOSAL | Status: AC
  Administered 2022-04-07: 15 mL via OROMUCOSAL
  Filled 2022-04-07: qty 15

## 2022-04-07 MED ORDER — ACETAMINOPHEN 500 MG PO TABS: 1000.0000 mg | ORAL_TABLET | Freq: Once | ORAL | Status: AC

## 2022-04-07 MED ORDER — PROPOFOL 500 MG/50ML IV EMUL
INTRAVENOUS | Status: DC | PRN
  Administered 2022-04-07: 125 ug/kg/min via INTRAVENOUS

## 2022-04-07 MED ORDER — 0.9 % SODIUM CHLORIDE (POUR BTL) OPTIME
TOPICAL | Status: DC | PRN
  Administered 2022-04-07: 1000 mL

## 2022-04-07 MED ORDER — ORAL CARE MOUTH RINSE: 15.0000 mL | Freq: Once | OROMUCOSAL | Status: AC

## 2022-04-07 MED ORDER — LACTATED RINGERS IV SOLN: INTRAVENOUS | Status: DC

## 2022-04-07 MED ORDER — VANCOMYCIN HCL IN DEXTROSE 1-5 GM/200ML-% IV SOLN
INTRAVENOUS | Status: AC
  Administered 2022-04-07: 1000 mg via INTRAVENOUS
  Filled 2022-04-07: qty 200

## 2022-04-07 MED ORDER — ROPIVACAINE HCL 5 MG/ML IJ SOLN
INTRAMUSCULAR | Status: DC | PRN
  Administered 2022-04-07: 30 mL via PERINEURAL

## 2022-04-07 MED ORDER — ACETAMINOPHEN 500 MG PO TABS
ORAL_TABLET | ORAL | Status: AC
  Administered 2022-04-07: 1000 mg via ORAL
  Filled 2022-04-07: qty 2

## 2022-04-07 MED ORDER — OXYCODONE HCL 5 MG PO TABS: ORAL_TABLET | ORAL | 0 refills | Status: DC

## 2022-04-07 SURGICAL SUPPLY — 59 items
BIT DRILL 2.0 LNG QUCK RELEASE (BIT) IMPLANT
BIT DRILL 2.8 QUICK RELEASE (BIT) IMPLANT
BLADE SURG 15 STRL LF DISP TIS (BLADE) ×2 IMPLANT
BLADE SURG 15 STRL SS (BLADE) ×4
BNDG CMPR 9X4 STRL LF SNTH (GAUZE/BANDAGES/DRESSINGS) ×1
BNDG ELASTIC 3X5.8 VLCR STR LF (GAUZE/BANDAGES/DRESSINGS) ×2 IMPLANT
BNDG ESMARK 4X9 LF (GAUZE/BANDAGES/DRESSINGS) ×2 IMPLANT
BNDG GAUZE ELAST 4 BULKY (GAUZE/BANDAGES/DRESSINGS) ×2 IMPLANT
BNDG PLASTER X FAST 3X3 WHT LF (CAST SUPPLIES) ×20 IMPLANT
CHLORAPREP W/TINT 26 (MISCELLANEOUS) ×2 IMPLANT
CORD BIPOLAR FORCEPS 12FT (ELECTRODE) ×2 IMPLANT
COVER BACK TABLE 60X90IN (DRAPES) ×2 IMPLANT
COVER MAYO STAND STRL (DRAPES) ×2 IMPLANT
CUFF TOURN SGL QUICK 18X4 (TOURNIQUET CUFF) IMPLANT
CUFF TOURN SGL QUICK 24 (TOURNIQUET CUFF)
CUFF TRNQT CYL 24X4X16.5-23 (TOURNIQUET CUFF) IMPLANT
DRAPE EXTREMITY T 121X128X90 (DISPOSABLE) ×2 IMPLANT
DRAPE OEC MINIVIEW 54X84 (DRAPES) ×2 IMPLANT
DRAPE SURG 17X23 STRL (DRAPES) ×2 IMPLANT
DRILL 2.0 LNG QUICK RELEASE (BIT) ×2
DRILL 2.8 QUICK RELEASE (BIT) ×2
GAUZE SPONGE 4X4 12PLY STRL (GAUZE/BANDAGES/DRESSINGS) ×2 IMPLANT
GAUZE XEROFORM 1X8 LF (GAUZE/BANDAGES/DRESSINGS) ×2 IMPLANT
GLOVE BIO SURGEON STRL SZ7.5 (GLOVE) ×2 IMPLANT
GLOVE BIOGEL PI IND STRL 8 (GLOVE) ×1 IMPLANT
GLOVE BIOGEL PI IND STRL 8.5 (GLOVE) IMPLANT
GLOVE BIOGEL PI INDICATOR 8 (GLOVE) ×1
GLOVE BIOGEL PI INDICATOR 8.5 (GLOVE)
GLOVE SURG ORTHO 8.0 STRL STRW (GLOVE) IMPLANT
GOWN STRL REUS W/ TWL LRG LVL3 (GOWN DISPOSABLE) ×1 IMPLANT
GOWN STRL REUS W/TWL LRG LVL3 (GOWN DISPOSABLE) ×2
GOWN STRL REUS W/TWL XL LVL3 (GOWN DISPOSABLE) ×2 IMPLANT
GUIDEWIRE ORTHO 0.054X6 (WIRE) ×3 IMPLANT
NDL HYPO 25X1 1.5 SAFETY (NEEDLE) IMPLANT
NEEDLE HYPO 25X1 1.5 SAFETY (NEEDLE) IMPLANT
NS IRRIG 1000ML POUR BTL (IV SOLUTION) ×2 IMPLANT
PACK BASIN DAY SURGERY FS (CUSTOM PROCEDURE TRAY) ×2 IMPLANT
PAD CAST 3X4 CTTN HI CHSV (CAST SUPPLIES) ×1 IMPLANT
PADDING CAST COTTON 3X4 STRL (CAST SUPPLIES) ×2
PLATE PROXIMAL VDU ACULOC (Plate) ×1 IMPLANT
SCREW CORT FT 18X2.3XLCK HEX (Screw) IMPLANT
SCREW CORTICAL LOCKING 2.3X16M (Screw) ×2 IMPLANT
SCREW CORTICAL LOCKING 2.3X18M (Screw) ×8 IMPLANT
SCREW CORTICAL LOCKING 2.3X20M (Screw) ×2 IMPLANT
SCREW FX16X2.3XLCK SMTH NS CRT (Screw) IMPLANT
SCREW FX18X2.3XSMTH LCK NS CRT (Screw) IMPLANT
SCREW FX20X2.3XSMTH LCK NS CRT (Screw) IMPLANT
SCREW NLCKG 13 3.5X13 HEXA (Screw) IMPLANT
SCREW NON-LOCK 3.5X13 (Screw) ×2 IMPLANT
SCREW NONLOCK HEX 3.5X12 (Screw) ×1 IMPLANT
SLEEVE SCD COMPRESS KNEE MED (STOCKING) IMPLANT
SLING ARM IMMOBILIZER MED (SOFTGOODS) ×1 IMPLANT
STOCKINETTE 4X48 STRL (DRAPES) ×2 IMPLANT
SUT ETHILON 4 0 PS 2 18 (SUTURE) ×2 IMPLANT
SUT VICRYL 4-0 PS2 18IN ABS (SUTURE) ×2 IMPLANT
SYR BULB EAR ULCER 3OZ GRN STR (SYRINGE) ×2 IMPLANT
SYR CONTROL 10ML LL (SYRINGE) IMPLANT
TOWEL GREEN STERILE FF (TOWEL DISPOSABLE) ×4 IMPLANT
UNDERPAD 30X36 HEAVY ABSORB (UNDERPADS AND DIAPERS) ×2 IMPLANT

## 2022-04-07 NOTE — Discharge Instructions (Addendum)

## 2022-04-07 NOTE — H&P (Signed)
?Deanna Yang is an 61 y.o. female.   ?Chief Complaint: wrist fracture ?HPI: 61 yo female states she fell from standing height 03/31/22 injuring right wrist.  Seen at ED 04/01/22 where XR revealed distal radius fracture.  Splinted and followed up in office.  She wishes to proceed with operative fixation. ? ?Allergies:  ?Allergies  ?Allergen Reactions  ? Ambien [Zolpidem Tartrate] Shortness Of Breath and Swelling  ?  Tongue swelling, throat swelling. ?  ? Beta Adrenergic Blockers Anaphylaxis  ? Fish Allergy Shortness Of Breath and Rash  ? Iodinated Contrast Media Itching  ?  Benadryl 50MG  prophylaxis before and after and patient reports she did fine  ? Latex Itching and Other (See Comments)  ?  Blisters skin  ? Metoprolol Other (See Comments)  ?  ANY MEDICATION THAT ENDS IN -OLOL-   The patient is asthmatic.  ? Penicillins Shortness Of Breath, Rash and Other (See Comments)  ?  Has patient had a PCN reaction causing immediate rash, facial/tongue/throat swelling, SOB or lightheadedness with hypotension: Yes ?Has patient had a PCN reaction causing severe rash involving mucus membranes or skin necrosis: Unknown ?Has patient had a PCN reaction that required hospitalization No ?Has patient had a PCN reaction occurring within the last 10 years: No ?If all of the above answers are "NO", then may proceed with Cephalosporin use. ?  ? Red Dye Hives and Shortness Of Breath  ? Shellfish Allergy Anaphylaxis  ? Sulfa Antibiotics Hives and Shortness Of Breath  ? Amoxicillin-Pot Clavulanate Nausea And Vomiting  ? Aspirin Other (See Comments)  ?  Blisters on tongue from higher doses - tolerates 81 mg ?  ? Dye Fdc Red  [Amaranth (Fd&C Red #2)] Hives  ?  X ray DYE  (BENADRYL 50 MG PROPHYLAXIS AND AFTER AND  SHE DID FINE, PER PATIENT.).  ? Tape Dermatitis and Rash  ?  Adhesive ?  ? Talwin [Pentazocine] Other (See Comments)  ?  Unknown reaction  ? Levofloxacin Rash and Other (See Comments)  ?  headache  ? Seroquel [Quetiapine Fumarate]  Other (See Comments)  ?  hallucinate  ? ? ?Past Medical History:  ?Diagnosis Date  ? Anxiety   ? Arthritis   ? Chronic kidney disease   ? stage 2  ? COPD (chronic obstructive pulmonary disease) (HCC)   ? Depression   ? Dyspnea   ? On 3L oxygen when laying down  ? Fatty liver 07/23/2018  ? Fibromyalgia   ? GERD (gastroesophageal reflux disease)   ? Headache   ? HSV infection   ? Hypertension   ? Manic depression (HCC)   ? Neuropathy   ? hands  ? OSA (obstructive sleep apnea)   ? does not use CPAP  ? Pneumonia   ? "couple of times"  ? ? ?Past Surgical History:  ?Procedure Laterality Date  ? ABDOMINAL SURGERY    ? APPENDECTOMY    ? BACK SURGERY    ? BREAST REDUCTION SURGERY    ? with lift  ? CARPAL TUNNEL RELEASE Bilateral   ? CESAREAN SECTION    ? CHOLECYSTECTOMY    ? COLONOSCOPY WITH PROPOFOL    ? HERNIA REPAIR    ? JOINT REPLACEMENT Left   ? knee  ? NECK SURGERY    ? with rod  ? TONSILLECTOMY    ? TOOTH EXTRACTION    ? with sedation  ? UPPER GI ENDOSCOPY    ? ? ?Family History: ?Family History  ?Problem Relation Age  of Onset  ? Alcohol abuse Mother   ? Alcohol abuse Father   ? Depression Sister   ? Dementia Neg Hx   ? ? ?Social History:  ? reports that she quit smoking about 5 years ago. Her smoking use included cigarettes. She smoked an average of 1 pack per day. She has never used smokeless tobacco. She reports that she does not drink alcohol and does not use drugs. ? ?Medications: ?No medications prior to admission.  ? ? ?No results found for this or any previous visit (from the past 48 hour(s)). ? ?No results found. ? ? ? ?Height 5\' 7"  (1.702 m), weight 99.8 kg. ? ?General appearance: alert, cooperative, and appears stated age ?Head: Normocephalic, without obvious abnormality, atraumatic ?Neck: supple, symmetrical, trachea midline ?Extremities: Intact sensation and capillary refill all digits.  +epl/fpl/io.  No wounds.  ?Pulses: 2+ and symmetric ?Skin: Skin color, texture, turgor normal. No rashes or  lesions ?Neurologic: Grossly normal ?Incision/Wound: none ? ?Assessment/Plan ?Right distal radius fracture.  Non operative and operative treatment options have been discussed with the patient and patient wishes to proceed with operative treatment. Risks, benefits, and alternatives of surgery have been discussed and the patient agrees with the plan of care.  ? ? ?04/07/2022, 9:43 AM ? ?

## 2022-04-07 NOTE — Op Note (Signed)
04/07/2022 ?[facility] OR ? ?Operative Note ? ?Pre Op Diagnosis: Right comminuted intraarticular distal radius fracture ? ?Post Op Diagnosis: Right comminuted intraarticular distal radius fracture ? ?Procedure:  ?ORIF right comminuted intraarticular distal radius fracture, 3 intraarticular fragments ?Right brachioradialis release ? ?Surgeon: Betha Loa, MD ? ?Assistant: Cindee Salt, MD ? ?Anesthesia: Regional with sedation ? ?Fluids: Per anesthesia flow sheet ? ?EBL: minimal ? ?Complications: None ? ?Specimen: None ? ?Tourniquet Time: ? ?Total Tourniquet Time Documented: ?Upper Arm (Right) - 44 minutes ?Total: Upper Arm (Right) - 44 minutes ? ? ?Disposition: Stable to PACU ? ?INDICATIONS:  Deanna Yang is a 61 y.o. female states she fell from standing height last week injuring right wrist.  Seen in ED where XR revealed distal radius fracture.  Splinted and followed up in the office.  We discussed nonoperative and operative treatment options.  She wished to proceed with operative fixation.  Risks, benefits, and alternatives of surgery were discussed including the risk of blood loss; infection; damage to nerves, vessels, tendons, ligaments, bone; failure of surgery; need for additional surgery; complications with wound healing; continued pain; nonunion; malunion; stiffness.  We also discussed the possible need for bone graft and the benefits and risks including the possibility of disease transmission.  She voiced understanding of these risks and elected to proceed. ?  ? ?OPERATIVE COURSE:  After being identified preoperatively by myself, the patient and I agreed upon the procedure and site of procedure.  Surgical site was marked.   Surgical consent had been signed.  She was given IV vancomycin as preoperative antibiotic prophylaxis.  She was transferred to the operating room and placed on the operating room table in supine position with the right upper extremity on an armboard. Sedation was induced by the anesthesiologist.   A regional block had been performed by anesthesia in preoperative holding.  The right upper extremity was prepped and draped in normal sterile orthopedic fashion.  A surgical pause was performed between the surgeons, anesthesia and operating room staff, and all were in agreement as to the patient, procedure and site of procedure.  Tourniquet at the proximal aspect of the extremity was inflated to 250 mmHg after exsanguination of the limb with an Esmarch bandage.  Standard volar Sherilyn Cooter approach was used.  The bipolar electrocautery was used to obtain hemostasis.  The superficial and deep portions of the FCR tendon sheath were incised, and the FCR and FPL were swept ulnarly to protect the palmar cutaneous branch of the median nerve.  The brachioradialis was released at the radial side of the radius.  The pronator quadratus was released and elevated with the periosteal elevator.  The fracture site was identified and cleared of soft tissue interposition and hematoma.  It was reduced under direct visualization.  There was intraarticular extension creating three intraarticular fragments.  An AcuMed volar distal radial locking plate was selected.  It was secured to the bone with the guidepins.  C-arm was used in AP and lateral projections to ensure appropriate reduction and position of the hardware and adjustments made as necessary.  Standard AO drilling and measuring technique was used.  A single screw was placed in the slotted hole in the shaft of the plate.  The distal holes were filled with locking pegs with the exception of the styloid holes, which were filled with locking screws.  The remaining holes in the shaft of the plate were filled with nonlocking screws.  Good purchase was obtained.  C-arm was used in AP, lateral  and oblique projections to ensure appropriate reduction and position of hardware, which was the case.  There was no intra-articular penetration of hardware.  The wound was copiously irrigated with  sterile saline.  Pronator quadratus was repaired back over top of the plate using 4-0 Vicryl suture.  Vicryl suture was placed in the subcutaneous tissues in an inverted interrupted fashion and the skin was closed with 4-0 nylon in a horizontal mattress fashion.  There was good pronation and supination of the wrist without crepitance.  The wound was then dressed with sterile Xeroform, 4x4s, and wrapped with a Kerlix bandage.  A volar splint was placed and wrapped with Kerlix and Ace bandage.  Tourniquet was deflated at 44 minutes.  Fingertips were pink with brisk capillary refill after deflation of the tourniquet.  Operative drapes were broken down.  The patient was awoken from anesthesia safely.  She was transferred back to the stretcher and taken to the PACU in stable condition.  I will see her back in the office in one week for postoperative followup.  I will give her a prescription for oxycodone 5 mg 1 po q6 hours prn pain disp #20. ? ? ? ?Betha Loa, MD ?Electronically signed, 04/07/22 ?

## 2022-04-07 NOTE — Transfer of Care (Signed)
Immediate Anesthesia Transfer of Care Note ? ?Patient: Coralie Common ? ?Procedure(s) Performed: OPEN REDUCTION INTERNAL FIXATION (ORIF) RIGHT DISTAL RADIUS FRACTURE (Right: Wrist) ?BRACHIORADIALIS RELEASE (Right: Wrist) ? ?Patient Location: PACU ? ?Anesthesia Type:MAC combined with regional for post-op pain ? ?Level of Consciousness: awake, alert  and oriented ? ?Airway & Oxygen Therapy: Patient Spontanous Breathing and Patient connected to nasal cannula oxygen ? ?Post-op Assessment: Report given to RN and Post -op Vital signs reviewed and stable ? ?Post vital signs: Reviewed and stable ? ?Last Vitals:  ?Vitals Value Taken Time  ?BP    ?Temp    ?Pulse 74 04/07/22 1436  ?Resp 10 04/07/22 1436  ?SpO2 94 % 04/07/22 1436  ?Vitals shown include unvalidated device data. ? ?Last Pain:  ?Vitals:  ? 04/07/22 1113  ?PainSc: 7   ?   ? ?Patients Stated Pain Goal: 3 (04/07/22 1113) ? ?Complications: No notable events documented. ?

## 2022-04-07 NOTE — Op Note (Signed)
I assisted Surgeon(s) and Role: ?   Betha Loa, MD - Primary ?   Cindee Salt, MD - Assisting on the Procedure(s): ?OPEN REDUCTION INTERNAL FIXATION (ORIF) RIGHT DISTAL RADIUS FRACTURE ?BRACHIORADIALIS RELEASE on 04/07/2022.  I provided assistance on this case as follows: setup, approach, identification of the fracture, release of the brachioradialis,  ?Debridement, reduction, stabilization,fixation of the fracture with plate and screws, closure of the wound and application of the dressing and splint. ?Electronically signed by: Cindee Salt, MD ?Date: 04/07/2022 Time: 2:27 PM  ?

## 2022-04-07 NOTE — Anesthesia Procedure Notes (Signed)
Anesthesia Regional Block: Axillary brachial plexus block  ? ?Pre-Anesthetic Checklist: , timeout performed,  Correct Patient, Correct Site, Correct Laterality,  Correct Procedure, Correct Position, site marked,  Risks and benefits discussed,  Surgical consent,  Pre-op evaluation,  At surgeon's request and post-op pain management ? ?Laterality: Right ? ?Prep: chloraprep     ?  ?Needles:  ?Injection technique: Single-shot ? ?Needle Type: Echogenic Needle   ? ? ?Needle Length: 9cm  ?Needle Gauge: 21  ? ? ? ?Additional Needles: ? ? ?Procedures:,,,, ultrasound used (permanent image in chart),,    ?Narrative:  ?Start time: 04/07/2022 11:37 AM ?End time: 04/07/2022 11:43 AM ?Injection made incrementally with aspirations every 5 mL. ? ?Performed by: Personally  ?Anesthesiologist: Collene Schlichter, MD ? ?Additional Notes: ?No pain on injection. No increased resistance to injection. Injection made in 5cc increments.  Good needle visualization.  Patient tolerated procedure well. ? ? ? ? ?

## 2022-04-08 ENCOUNTER — Encounter (HOSPITAL_COMMUNITY): Payer: Self-pay | Admitting: Orthopedic Surgery

## 2022-04-08 NOTE — Anesthesia Postprocedure Evaluation (Signed)
Anesthesia Post Note ? ?Patient: Deanna Yang ? ?Procedure(s) Performed: OPEN REDUCTION INTERNAL FIXATION (ORIF) RIGHT DISTAL RADIUS FRACTURE (Right: Wrist) ?BRACHIORADIALIS RELEASE (Right: Wrist) ? ?  ? ?Patient location during evaluation: PACU ?Anesthesia Type: Regional and MAC ?Level of consciousness: awake and alert ?Pain management: pain level controlled ?Vital Signs Assessment: post-procedure vital signs reviewed and stable ?Respiratory status: spontaneous breathing, nonlabored ventilation and respiratory function stable ?Cardiovascular status: stable and blood pressure returned to baseline ?Postop Assessment: no apparent nausea or vomiting ?Anesthetic complications: no ? ? ?No notable events documented. ? ?Last Vitals:  ?Vitals:  ? 04/07/22 1450 04/07/22 1505  ?BP: 123/65 (!) 150/77  ?Pulse: 74 75  ?Resp: 14 12  ?Temp:  36.8 ?C  ?SpO2: 93% 92%  ?  ?Last Pain:  ?Vitals:  ? 04/07/22 1505  ?PainSc: 0-No pain  ? ? ?  ?  ?  ?  ?  ?  ? ?Santa Lighter ? ? ? ? ?

## 2022-11-12 ENCOUNTER — Emergency Department (HOSPITAL_COMMUNITY): Payer: Medicare Other

## 2022-11-12 ENCOUNTER — Encounter (HOSPITAL_COMMUNITY): Payer: Self-pay | Admitting: Infectious Diseases

## 2022-11-12 ENCOUNTER — Inpatient Hospital Stay (HOSPITAL_COMMUNITY): Payer: Medicare Other | Admitting: Anesthesiology

## 2022-11-12 ENCOUNTER — Encounter (HOSPITAL_COMMUNITY)
Admission: EM | Disposition: A | Discharge status: Home Health | Payer: Self-pay | Source: Home / Self Care | Attending: Infectious Diseases

## 2022-11-12 ENCOUNTER — Other Ambulatory Visit: Payer: Self-pay

## 2022-11-12 ENCOUNTER — Inpatient Hospital Stay (HOSPITAL_COMMUNITY): Payer: Medicare Other

## 2022-11-12 ENCOUNTER — Inpatient Hospital Stay (HOSPITAL_COMMUNITY)
Admission: EM | Admit: 2022-11-12 | Discharge: 2022-11-15 | DRG: 493 | Disposition: A | Discharge status: Home Health | Payer: Medicare Other | Attending: Internal Medicine | Admitting: Internal Medicine

## 2022-11-12 DIAGNOSIS — Z87891 Personal history of nicotine dependence: Secondary | ICD-10-CM

## 2022-11-12 DIAGNOSIS — Z888 Allergy status to other drugs, medicaments and biological substances status: Secondary | ICD-10-CM

## 2022-11-12 DIAGNOSIS — F419 Anxiety disorder, unspecified: Secondary | ICD-10-CM | POA: Diagnosis present

## 2022-11-12 DIAGNOSIS — K760 Fatty (change of) liver, not elsewhere classified: Secondary | ICD-10-CM | POA: Diagnosis present

## 2022-11-12 DIAGNOSIS — N189 Chronic kidney disease, unspecified: Secondary | ICD-10-CM | POA: Diagnosis not present

## 2022-11-12 DIAGNOSIS — S82201A Unspecified fracture of shaft of right tibia, initial encounter for closed fracture: Secondary | ICD-10-CM

## 2022-11-12 DIAGNOSIS — Z91040 Latex allergy status: Secondary | ICD-10-CM

## 2022-11-12 DIAGNOSIS — N179 Acute kidney failure, unspecified: Secondary | ICD-10-CM | POA: Diagnosis present

## 2022-11-12 DIAGNOSIS — G894 Chronic pain syndrome: Secondary | ICD-10-CM | POA: Diagnosis present

## 2022-11-12 DIAGNOSIS — S82401A Unspecified fracture of shaft of right fibula, initial encounter for closed fracture: Secondary | ICD-10-CM | POA: Diagnosis present

## 2022-11-12 DIAGNOSIS — J4489 Other specified chronic obstructive pulmonary disease: Secondary | ICD-10-CM | POA: Diagnosis present

## 2022-11-12 DIAGNOSIS — M1990 Unspecified osteoarthritis, unspecified site: Secondary | ICD-10-CM | POA: Diagnosis present

## 2022-11-12 DIAGNOSIS — F319 Bipolar disorder, unspecified: Secondary | ICD-10-CM | POA: Diagnosis present

## 2022-11-12 DIAGNOSIS — Z79899 Other long term (current) drug therapy: Secondary | ICD-10-CM

## 2022-11-12 DIAGNOSIS — G4733 Obstructive sleep apnea (adult) (pediatric): Secondary | ICD-10-CM | POA: Diagnosis present

## 2022-11-12 DIAGNOSIS — I129 Hypertensive chronic kidney disease with stage 1 through stage 4 chronic kidney disease, or unspecified chronic kidney disease: Secondary | ICD-10-CM | POA: Diagnosis present

## 2022-11-12 DIAGNOSIS — Z881 Allergy status to other antibiotic agents status: Secondary | ICD-10-CM

## 2022-11-12 DIAGNOSIS — S82831A Other fracture of upper and lower end of right fibula, initial encounter for closed fracture: Secondary | ICD-10-CM | POA: Diagnosis present

## 2022-11-12 DIAGNOSIS — Z91041 Radiographic dye allergy status: Secondary | ICD-10-CM

## 2022-11-12 DIAGNOSIS — N182 Chronic kidney disease, stage 2 (mild): Secondary | ICD-10-CM | POA: Diagnosis present

## 2022-11-12 DIAGNOSIS — M8580 Other specified disorders of bone density and structure, unspecified site: Secondary | ICD-10-CM | POA: Diagnosis present

## 2022-11-12 DIAGNOSIS — W19XXXA Unspecified fall, initial encounter: Secondary | ICD-10-CM | POA: Diagnosis present

## 2022-11-12 DIAGNOSIS — S82251A Displaced comminuted fracture of shaft of right tibia, initial encounter for closed fracture: Secondary | ICD-10-CM | POA: Diagnosis not present

## 2022-11-12 DIAGNOSIS — Z9049 Acquired absence of other specified parts of digestive tract: Secondary | ICD-10-CM

## 2022-11-12 DIAGNOSIS — S82871A Displaced pilon fracture of right tibia, initial encounter for closed fracture: Principal | ICD-10-CM | POA: Diagnosis present

## 2022-11-12 DIAGNOSIS — Z880 Allergy status to penicillin: Secondary | ICD-10-CM

## 2022-11-12 DIAGNOSIS — E860 Dehydration: Secondary | ICD-10-CM | POA: Diagnosis present

## 2022-11-12 DIAGNOSIS — R079 Chest pain, unspecified: Secondary | ICD-10-CM | POA: Diagnosis present

## 2022-11-12 DIAGNOSIS — Z818 Family history of other mental and behavioral disorders: Secondary | ICD-10-CM

## 2022-11-12 DIAGNOSIS — Z882 Allergy status to sulfonamides status: Secondary | ICD-10-CM

## 2022-11-12 DIAGNOSIS — I959 Hypotension, unspecified: Secondary | ICD-10-CM | POA: Diagnosis not present

## 2022-11-12 DIAGNOSIS — Y92013 Bedroom of single-family (private) house as the place of occurrence of the external cause: Secondary | ICD-10-CM

## 2022-11-12 DIAGNOSIS — Z1152 Encounter for screening for COVID-19: Secondary | ICD-10-CM

## 2022-11-12 DIAGNOSIS — Y9389 Activity, other specified: Secondary | ICD-10-CM

## 2022-11-12 DIAGNOSIS — N1831 Acute kidney failure, unspecified: Secondary | ICD-10-CM | POA: Diagnosis present

## 2022-11-12 DIAGNOSIS — I10 Essential (primary) hypertension: Secondary | ICD-10-CM | POA: Diagnosis not present

## 2022-11-12 DIAGNOSIS — M542 Cervicalgia: Secondary | ICD-10-CM | POA: Diagnosis present

## 2022-11-12 DIAGNOSIS — R296 Repeated falls: Secondary | ICD-10-CM | POA: Diagnosis present

## 2022-11-12 DIAGNOSIS — Z7951 Long term (current) use of inhaled steroids: Secondary | ICD-10-CM

## 2022-11-12 DIAGNOSIS — M797 Fibromyalgia: Secondary | ICD-10-CM | POA: Diagnosis present

## 2022-11-12 DIAGNOSIS — Z9181 History of falling: Secondary | ICD-10-CM

## 2022-11-12 DIAGNOSIS — J449 Chronic obstructive pulmonary disease, unspecified: Secondary | ICD-10-CM | POA: Diagnosis not present

## 2022-11-12 DIAGNOSIS — Z96652 Presence of left artificial knee joint: Secondary | ICD-10-CM | POA: Diagnosis present

## 2022-11-12 DIAGNOSIS — F209 Schizophrenia, unspecified: Secondary | ICD-10-CM | POA: Diagnosis present

## 2022-11-12 DIAGNOSIS — F32A Depression, unspecified: Secondary | ICD-10-CM | POA: Insufficient documentation

## 2022-11-12 DIAGNOSIS — E785 Hyperlipidemia, unspecified: Secondary | ICD-10-CM | POA: Diagnosis present

## 2022-11-12 HISTORY — PX: TIBIA IM NAIL INSERTION: SHX2516

## 2022-11-12 LAB — COMPREHENSIVE METABOLIC PANEL
ALT: 23 U/L (ref 0–44)
AST: 17 U/L (ref 15–41)
Albumin: 3.1 g/dL — ABNORMAL LOW (ref 3.5–5.0)
Alkaline Phosphatase: 69 U/L (ref 38–126)
Anion gap: 11 (ref 5–15)
BUN: 15 mg/dL (ref 6–20)
CO2: 24 mmol/L (ref 22–32)
Calcium: 8.9 mg/dL (ref 8.9–10.3)
Chloride: 104 mmol/L (ref 98–111)
Creatinine, Ser: 1.87 mg/dL — ABNORMAL HIGH (ref 0.44–1.00)
GFR, Estimated: 30 mL/min — ABNORMAL LOW (ref >60)
Glucose, Bld: 116 mg/dL — ABNORMAL HIGH (ref 70–99)
Potassium: 3.8 mmol/L (ref 3.5–5.1)
Sodium: 139 mmol/L (ref 135–145)
Total Bilirubin: 0.7 mg/dL (ref 0.3–1.2)
Total Protein: 5.8 g/dL — ABNORMAL LOW (ref 6.5–8.1)

## 2022-11-12 LAB — CBC
HCT: 34.2 % — ABNORMAL LOW (ref 36.0–46.0)
Hemoglobin: 11.2 g/dL — ABNORMAL LOW (ref 12.0–15.0)
MCH: 29.2 pg (ref 26.0–34.0)
MCHC: 32.7 g/dL (ref 30.0–36.0)
MCV: 89.3 fL (ref 80.0–100.0)
Platelets: 354 10*3/uL (ref 150–400)
RBC: 3.83 MIL/uL — ABNORMAL LOW (ref 3.87–5.11)
RDW: 13.2 % (ref 11.5–15.5)
WBC: 13.3 10*3/uL — ABNORMAL HIGH (ref 4.0–10.5)
nRBC: 0 % (ref 0.0–0.2)

## 2022-11-12 LAB — PROTIME-INR
INR: 1 (ref 0.8–1.2)
Prothrombin Time: 13 seconds (ref 11.4–15.2)

## 2022-11-12 LAB — SAMPLE TO BLOOD BANK

## 2022-11-12 LAB — SURGICAL PCR SCREEN
MRSA, PCR: NEGATIVE
Staphylococcus aureus: NEGATIVE

## 2022-11-12 LAB — HIV ANTIBODY (ROUTINE TESTING W REFLEX): HIV Screen 4th Generation wRfx: NONREACTIVE

## 2022-11-12 LAB — ETHANOL: Alcohol, Ethyl (B): <10 mg/dL (ref <10)

## 2022-11-12 LAB — RESP PANEL BY RT-PCR (FLU A&B, COVID) ARPGX2
Influenza A by PCR: NEGATIVE
Influenza B by PCR: NEGATIVE
SARS Coronavirus 2 by RT PCR: NEGATIVE

## 2022-11-12 LAB — MAGNESIUM: Magnesium: 1.8 mg/dL (ref 1.7–2.4)

## 2022-11-12 SURGERY — INSERTION, INTRAMEDULLARY ROD, TIBIA
Anesthesia: General | Laterality: Right

## 2022-11-12 MED ORDER — MUPIROCIN 2 % EX OINT
TOPICAL_OINTMENT | CUTANEOUS | Status: AC
  Filled 2022-11-12: qty 22

## 2022-11-12 MED ORDER — EPHEDRINE 5 MG/ML INJ
INTRAVENOUS | Status: AC
  Filled 2022-11-12: qty 5

## 2022-11-12 MED ORDER — PRAZOSIN HCL 2 MG PO CAPS
2.0000 mg | ORAL_CAPSULE | Freq: Every day | ORAL | Status: DC
  Administered 2022-11-12 – 2022-11-13 (×2): 2 mg via ORAL
  Filled 2022-11-12 (×4): qty 1

## 2022-11-12 MED ORDER — BUPROPION HCL ER (XL) 150 MG PO TB24
300.0000 mg | ORAL_TABLET | Freq: Every day | ORAL | Status: DC
  Administered 2022-11-12 – 2022-11-15 (×4): 300 mg via ORAL
  Filled 2022-11-12 (×4): qty 2

## 2022-11-12 MED ORDER — OXYCODONE HCL 5 MG PO TABS
5.0000 mg | ORAL_TABLET | Freq: Four times a day (QID) | ORAL | Status: DC | PRN
  Administered 2022-11-13: 5 mg via ORAL
  Filled 2022-11-12: qty 1

## 2022-11-12 MED ORDER — LACTATED RINGERS IV SOLN: INTRAVENOUS | Status: AC

## 2022-11-12 MED ORDER — HYDROMORPHONE HCL 1 MG/ML IJ SOLN
INTRAMUSCULAR | Status: DC | PRN
  Administered 2022-11-12: .5 mg via INTRAVENOUS

## 2022-11-12 MED ORDER — EPHEDRINE SULFATE-NACL 50-0.9 MG/10ML-% IV SOSY
PREFILLED_SYRINGE | INTRAVENOUS | Status: DC | PRN
  Administered 2022-11-12 (×2): 5 mg via INTRAVENOUS

## 2022-11-12 MED ORDER — PHENYLEPHRINE 80 MCG/ML (10ML) SYRINGE FOR IV PUSH (FOR BLOOD PRESSURE SUPPORT)
PREFILLED_SYRINGE | INTRAVENOUS | Status: AC
  Filled 2022-11-12: qty 10

## 2022-11-12 MED ORDER — LIDOCAINE 2% (20 MG/ML) 5 ML SYRINGE
INTRAMUSCULAR | Status: AC
  Filled 2022-11-12: qty 5

## 2022-11-12 MED ORDER — FENTANYL CITRATE PF 50 MCG/ML IJ SOSY
50.0000 ug | PREFILLED_SYRINGE | Freq: Once | INTRAMUSCULAR | Status: AC
  Administered 2022-11-12: 50 ug via INTRAVENOUS
  Filled 2022-11-12: qty 1

## 2022-11-12 MED ORDER — LACTATED RINGERS IV SOLN: INTRAVENOUS | Status: DC

## 2022-11-12 MED ORDER — ACETAMINOPHEN 10 MG/ML IV SOLN
INTRAVENOUS | Status: AC
  Filled 2022-11-12: qty 100

## 2022-11-12 MED ORDER — POLYETHYLENE GLYCOL 3350 17 G PO PACK: 17.0000 g | PACK | Freq: Every day | ORAL | Status: DC | PRN

## 2022-11-12 MED ORDER — UMECLIDINIUM BROMIDE 62.5 MCG/ACT IN AEPB
1.0000 | INHALATION_SPRAY | Freq: Every day | RESPIRATORY_TRACT | Status: DC
  Administered 2022-11-15: 1 via RESPIRATORY_TRACT
  Filled 2022-11-12 (×2): qty 7

## 2022-11-12 MED ORDER — ALBUTEROL SULFATE HFA 108 (90 BASE) MCG/ACT IN AERS
INHALATION_SPRAY | RESPIRATORY_TRACT | Status: DC | PRN
  Administered 2022-11-12: 5 via RESPIRATORY_TRACT

## 2022-11-12 MED ORDER — PHENYLEPHRINE HCL-NACL 20-0.9 MG/250ML-% IV SOLN
INTRAVENOUS | Status: DC | PRN
  Administered 2022-11-12: 25 ug/min via INTRAVENOUS

## 2022-11-12 MED ORDER — DEXAMETHASONE SODIUM PHOSPHATE 10 MG/ML IJ SOLN
INTRAMUSCULAR | Status: AC
  Filled 2022-11-12: qty 1

## 2022-11-12 MED ORDER — PANTOPRAZOLE SODIUM 40 MG PO TBEC
40.0000 mg | DELAYED_RELEASE_TABLET | Freq: Every morning | ORAL | Status: DC
  Administered 2022-11-13 – 2022-11-15 (×3): 40 mg via ORAL
  Filled 2022-11-12 (×3): qty 1

## 2022-11-12 MED ORDER — PROPOFOL 10 MG/ML IV BOLUS
INTRAVENOUS | Status: AC
  Filled 2022-11-12: qty 20

## 2022-11-12 MED ORDER — ACETAMINOPHEN 650 MG RE SUPP: 650.0000 mg | Freq: Four times a day (QID) | RECTAL | Status: DC | PRN

## 2022-11-12 MED ORDER — FENTANYL CITRATE (PF) 250 MCG/5ML IJ SOLN
INTRAMUSCULAR | Status: DC | PRN
  Administered 2022-11-12: 100 ug via INTRAVENOUS
  Administered 2022-11-12: 50 ug via INTRAVENOUS
  Administered 2022-11-12: 100 ug via INTRAVENOUS

## 2022-11-12 MED ORDER — VANCOMYCIN HCL 500 MG IV SOLR
INTRAVENOUS | Status: AC
  Filled 2022-11-12: qty 10

## 2022-11-12 MED ORDER — 0.9 % SODIUM CHLORIDE (POUR BTL) OPTIME
TOPICAL | Status: DC | PRN
  Administered 2022-11-12: 1000 mL

## 2022-11-12 MED ORDER — HYDROXYZINE HCL 25 MG PO TABS: 25.0000 mg | ORAL_TABLET | Freq: Four times a day (QID) | ORAL | Status: DC | PRN

## 2022-11-12 MED ORDER — PROMETHAZINE HCL 25 MG/ML IJ SOLN: 6.2500 mg | INTRAMUSCULAR | Status: DC | PRN

## 2022-11-12 MED ORDER — STERILE WATER FOR IRRIGATION IR SOLN
Status: DC | PRN
  Administered 2022-11-12: 1000 mL

## 2022-11-12 MED ORDER — VANCOMYCIN HCL 750 MG/150ML IV SOLN
750.0000 mg | Freq: Two times a day (BID) | INTRAVENOUS | Status: AC
  Administered 2022-11-12 – 2022-11-13 (×2): 750 mg via INTRAVENOUS
  Filled 2022-11-12 (×3): qty 150

## 2022-11-12 MED ORDER — MUPIROCIN 2 % EX OINT: 1.0000 | TOPICAL_OINTMENT | Freq: Once | CUTANEOUS | Status: AC

## 2022-11-12 MED ORDER — PROPOFOL 10 MG/ML IV BOLUS
INTRAVENOUS | Status: DC | PRN
  Administered 2022-11-12: 150 mg via INTRAVENOUS

## 2022-11-12 MED ORDER — MIDAZOLAM HCL 2 MG/2ML IJ SOLN
INTRAMUSCULAR | Status: DC | PRN
  Administered 2022-11-12: 2 mg via INTRAVENOUS

## 2022-11-12 MED ORDER — HYDROMORPHONE HCL 1 MG/ML IJ SOLN
0.5000 mg | INTRAMUSCULAR | Status: DC | PRN
  Administered 2022-11-12 – 2022-11-13 (×6): 1 mg via INTRAVENOUS
  Filled 2022-11-12 (×6): qty 1

## 2022-11-12 MED ORDER — VANCOMYCIN HCL IN DEXTROSE 1-5 GM/200ML-% IV SOLN
INTRAVENOUS | Status: AC
  Administered 2022-11-12: 1000 mg via INTRAVENOUS
  Filled 2022-11-12: qty 200

## 2022-11-12 MED ORDER — FENTANYL CITRATE PF 50 MCG/ML IJ SOSY
50.0000 ug | PREFILLED_SYRINGE | INTRAMUSCULAR | Status: DC | PRN
  Administered 2022-11-12: 50 ug via INTRAVENOUS
  Filled 2022-11-12: qty 1

## 2022-11-12 MED ORDER — LIDOCAINE 2% (20 MG/ML) 5 ML SYRINGE
INTRAMUSCULAR | Status: DC | PRN
  Administered 2022-11-12: 60 mg via INTRAVENOUS

## 2022-11-12 MED ORDER — MIDAZOLAM HCL 2 MG/2ML IJ SOLN
INTRAMUSCULAR | Status: AC
  Filled 2022-11-12: qty 2

## 2022-11-12 MED ORDER — POTASSIUM CHLORIDE 20 MEQ PO PACK
20.0000 meq | PACK | Freq: Once | ORAL | Status: AC
  Administered 2022-11-12: 20 meq via ORAL
  Filled 2022-11-12: qty 1

## 2022-11-12 MED ORDER — FENTANYL CITRATE (PF) 250 MCG/5ML IJ SOLN
INTRAMUSCULAR | Status: AC
  Filled 2022-11-12: qty 5

## 2022-11-12 MED ORDER — HYDROMORPHONE HCL 1 MG/ML IJ SOLN: 0.5000 mg | INTRAMUSCULAR | Status: DC | PRN

## 2022-11-12 MED ORDER — HYDROMORPHONE HCL 1 MG/ML IJ SOLN
INTRAMUSCULAR | Status: AC
  Filled 2022-11-12: qty 0.5

## 2022-11-12 MED ORDER — CHLORHEXIDINE GLUCONATE 0.12 % MT SOLN
15.0000 mL | Freq: Once | OROMUCOSAL | Status: AC
  Administered 2022-11-12: 15 mL via OROMUCOSAL

## 2022-11-12 MED ORDER — DEXAMETHASONE SODIUM PHOSPHATE 10 MG/ML IJ SOLN
INTRAMUSCULAR | Status: DC | PRN
  Administered 2022-11-12: 10 mg via INTRAVENOUS

## 2022-11-12 MED ORDER — OXYCODONE-ACETAMINOPHEN 10-325 MG PO TABS: 1.0000 | ORAL_TABLET | Freq: Four times a day (QID) | ORAL | Status: DC | PRN

## 2022-11-12 MED ORDER — ROCURONIUM BROMIDE 10 MG/ML (PF) SYRINGE
PREFILLED_SYRINGE | INTRAVENOUS | Status: AC
  Filled 2022-11-12: qty 10

## 2022-11-12 MED ORDER — OXYCODONE HCL 5 MG/5ML PO SOLN: 5.0000 mg | Freq: Once | ORAL | Status: DC | PRN

## 2022-11-12 MED ORDER — ORAL CARE MOUTH RINSE: 15.0000 mL | Freq: Once | OROMUCOSAL | Status: AC

## 2022-11-12 MED ORDER — ALBUTEROL SULFATE (2.5 MG/3ML) 0.083% IN NEBU: 2.5000 mg | INHALATION_SOLUTION | Freq: Four times a day (QID) | RESPIRATORY_TRACT | Status: DC | PRN

## 2022-11-12 MED ORDER — VANCOMYCIN HCL 500 MG IV SOLR
INTRAVENOUS | Status: DC | PRN
  Administered 2022-11-12: 500 mg via TOPICAL

## 2022-11-12 MED ORDER — FENTANYL CITRATE (PF) 100 MCG/2ML IJ SOLN
INTRAMUSCULAR | Status: AC
  Filled 2022-11-12: qty 2

## 2022-11-12 MED ORDER — KETAMINE HCL 10 MG/ML IJ SOLN
INTRAMUSCULAR | Status: DC | PRN
  Administered 2022-11-12 (×2): 10 mg via INTRAVENOUS
  Administered 2022-11-12: 25 mg via INTRAVENOUS
  Administered 2022-11-12: 5 mg via INTRAVENOUS

## 2022-11-12 MED ORDER — QUETIAPINE FUMARATE 25 MG PO TABS
75.0000 mg | ORAL_TABLET | Freq: Every day | ORAL | Status: DC
  Administered 2022-11-12 – 2022-11-14 (×3): 75 mg via ORAL
  Filled 2022-11-12 (×3): qty 3

## 2022-11-12 MED ORDER — ACETAMINOPHEN 325 MG PO TABS: 650.0000 mg | ORAL_TABLET | Freq: Four times a day (QID) | ORAL | Status: DC | PRN

## 2022-11-12 MED ORDER — ONDANSETRON HCL 4 MG/2ML IJ SOLN
INTRAMUSCULAR | Status: AC
  Filled 2022-11-12: qty 2

## 2022-11-12 MED ORDER — ROCURONIUM BROMIDE 10 MG/ML (PF) SYRINGE
PREFILLED_SYRINGE | INTRAVENOUS | Status: DC | PRN
  Administered 2022-11-12: 20 mg via INTRAVENOUS
  Administered 2022-11-12: 50 mg via INTRAVENOUS
  Administered 2022-11-12: 10 mg via INTRAVENOUS

## 2022-11-12 MED ORDER — FLUTICASONE FUROATE-VILANTEROL 100-25 MCG/ACT IN AEPB
1.0000 | INHALATION_SPRAY | Freq: Every day | RESPIRATORY_TRACT | Status: DC
  Administered 2022-11-15: 1 via RESPIRATORY_TRACT
  Filled 2022-11-12 (×2): qty 28

## 2022-11-12 MED ORDER — OXYCODONE-ACETAMINOPHEN 5-325 MG PO TABS: 1.0000 | ORAL_TABLET | Freq: Four times a day (QID) | ORAL | Status: DC | PRN

## 2022-11-12 MED ORDER — ALBUTEROL SULFATE HFA 108 (90 BASE) MCG/ACT IN AERS
INHALATION_SPRAY | RESPIRATORY_TRACT | Status: AC
  Filled 2022-11-12: qty 6.7

## 2022-11-12 MED ORDER — VANCOMYCIN HCL IN DEXTROSE 1-5 GM/200ML-% IV SOLN: 1000.0000 mg | Freq: Once | INTRAVENOUS | Status: AC

## 2022-11-12 MED ORDER — ALBUTEROL SULFATE (2.5 MG/3ML) 0.083% IN NEBU
INHALATION_SOLUTION | RESPIRATORY_TRACT | Status: AC
  Administered 2022-11-12: 2.5 mg
  Filled 2022-11-12: qty 3

## 2022-11-12 MED ORDER — POVIDONE-IODINE 10 % EX SWAB: 2.0000 | Freq: Once | CUTANEOUS | Status: DC

## 2022-11-12 MED ORDER — CHLORHEXIDINE GLUCONATE 0.12 % MT SOLN
OROMUCOSAL | Status: AC
  Filled 2022-11-12: qty 15

## 2022-11-12 MED ORDER — ACETAMINOPHEN 10 MG/ML IV SOLN
INTRAVENOUS | Status: DC | PRN
  Administered 2022-11-12: 1000 mg via INTRAVENOUS

## 2022-11-12 MED ORDER — SUGAMMADEX SODIUM 200 MG/2ML IV SOLN
INTRAVENOUS | Status: DC | PRN
  Administered 2022-11-12: 200 mg via INTRAVENOUS

## 2022-11-12 MED ORDER — IPRATROPIUM-ALBUTEROL 0.5-2.5 (3) MG/3ML IN SOLN: 3.0000 mL | RESPIRATORY_TRACT | Status: DC | PRN

## 2022-11-12 MED ORDER — PHENYLEPHRINE 80 MCG/ML (10ML) SYRINGE FOR IV PUSH (FOR BLOOD PRESSURE SUPPORT)
PREFILLED_SYRINGE | INTRAVENOUS | Status: DC | PRN
  Administered 2022-11-12: 120 ug via INTRAVENOUS
  Administered 2022-11-12 (×4): 80 ug via INTRAVENOUS

## 2022-11-12 MED ORDER — MONTELUKAST SODIUM 10 MG PO TABS
10.0000 mg | ORAL_TABLET | Freq: Every day | ORAL | Status: DC
  Administered 2022-11-12 – 2022-11-14 (×3): 10 mg via ORAL
  Filled 2022-11-12 (×3): qty 1

## 2022-11-12 MED ORDER — KETAMINE HCL 50 MG/5ML IJ SOSY
PREFILLED_SYRINGE | INTRAMUSCULAR | Status: AC
  Filled 2022-11-12: qty 5

## 2022-11-12 MED ORDER — OXYCODONE HCL 5 MG PO TABS: 5.0000 mg | ORAL_TABLET | Freq: Once | ORAL | Status: DC | PRN

## 2022-11-12 MED ORDER — CHLORHEXIDINE GLUCONATE 4 % EX LIQD: 60.0000 mL | Freq: Once | CUTANEOUS | Status: DC

## 2022-11-12 MED ORDER — ONDANSETRON HCL 4 MG/2ML IJ SOLN
INTRAMUSCULAR | Status: DC | PRN
  Administered 2022-11-12: 4 mg via INTRAVENOUS

## 2022-11-12 MED ORDER — HEPARIN SODIUM (PORCINE) 5000 UNIT/ML IJ SOLN
5000.0000 [IU] | Freq: Three times a day (TID) | INTRAMUSCULAR | Status: DC
  Administered 2022-11-12 – 2022-11-15 (×7): 5000 [IU] via SUBCUTANEOUS
  Filled 2022-11-12 (×9): qty 1

## 2022-11-12 MED ORDER — PREGABALIN 25 MG PO CAPS
25.0000 mg | ORAL_CAPSULE | Freq: Two times a day (BID) | ORAL | Status: DC
  Administered 2022-11-12 – 2022-11-13 (×2): 25 mg via ORAL
  Filled 2022-11-12 (×2): qty 1

## 2022-11-12 MED ORDER — BUSPIRONE HCL 5 MG PO TABS
15.0000 mg | ORAL_TABLET | Freq: Two times a day (BID) | ORAL | Status: DC | PRN
  Administered 2022-11-13: 15 mg via ORAL
  Filled 2022-11-12: qty 1

## 2022-11-12 MED ORDER — AMLODIPINE BESYLATE 10 MG PO TABS
10.0000 mg | ORAL_TABLET | Freq: Every day | ORAL | Status: DC
  Administered 2022-11-12 – 2022-11-14 (×3): 10 mg via ORAL
  Filled 2022-11-12 (×3): qty 1

## 2022-11-12 MED ORDER — LEVOFLOXACIN IN D5W 500 MG/100ML IV SOLN
500.0000 mg | Freq: Once | INTRAVENOUS | Status: AC
  Administered 2022-11-12: 500 mg via INTRAVENOUS
  Filled 2022-11-12: qty 100

## 2022-11-12 MED ORDER — FENTANYL CITRATE (PF) 100 MCG/2ML IJ SOLN
25.0000 ug | INTRAMUSCULAR | Status: DC | PRN
  Administered 2022-11-12: 50 ug via INTRAVENOUS

## 2022-11-12 MED ORDER — MORPHINE SULFATE ER 15 MG PO TBCR
30.0000 mg | EXTENDED_RELEASE_TABLET | Freq: Two times a day (BID) | ORAL | Status: DC
  Administered 2022-11-12 – 2022-11-15 (×6): 30 mg via ORAL
  Filled 2022-11-12 (×6): qty 2

## 2022-11-12 SURGICAL SUPPLY — 71 items
APL PRP STRL LF DISP 70% ISPRP (MISCELLANEOUS) ×2
BAG COUNTER SPONGE SURGICOUNT (BAG) ×1 IMPLANT
BAG SPNG CNTER NS LX DISP (BAG) ×1
BANDAGE ESMARK 6X9 LF (GAUZE/BANDAGES/DRESSINGS) IMPLANT
BIT DRILL 4.3 CALIBRATED (DRILL) IMPLANT
BIT DRILL FREE TARG 3.3MM (DRILL) IMPLANT
BNDG CMPR MED 10X6 ELC LF (GAUZE/BANDAGES/DRESSINGS) ×1
BNDG COHESIVE 4X5 TAN STRL (GAUZE/BANDAGES/DRESSINGS) ×1 IMPLANT
BNDG COHESIVE 6X5 TAN STRL LF (GAUZE/BANDAGES/DRESSINGS) ×1 IMPLANT
BNDG ELASTIC 4X5.8 VLCR STR LF (GAUZE/BANDAGES/DRESSINGS) ×1 IMPLANT
BNDG ELASTIC 6X10 VLCR STRL LF (GAUZE/BANDAGES/DRESSINGS) IMPLANT
BNDG ESMARK 6X9 LF (GAUZE/BANDAGES/DRESSINGS) ×1
BNDG GAUZE DERMACEA FLUFF 4 (GAUZE/BANDAGES/DRESSINGS) IMPLANT
BNDG GZE DERMACEA 4 6PLY (GAUZE/BANDAGES/DRESSINGS) ×2
CHLORAPREP W/TINT 26 (MISCELLANEOUS) IMPLANT
COVER SURGICAL LIGHT HANDLE (MISCELLANEOUS) ×2 IMPLANT
CUFF TOURN SGL QUICK 34 (TOURNIQUET CUFF) ×1
CUFF TOURN SGL QUICK 42 (TOURNIQUET CUFF) IMPLANT
CUFF TRNQT CYL 34X4.125X (TOURNIQUET CUFF) ×1 IMPLANT
DRAPE C-ARM 42X72 X-RAY (DRAPES) ×1 IMPLANT
DRAPE C-ARMOR (DRAPES) ×1 IMPLANT
DRAPE IMP U-DRAPE 54X76 (DRAPES) ×1 IMPLANT
DRAPE ORTHO SPLIT 77X108 STRL (DRAPES)
DRAPE SURG ORHT 6 SPLT 77X108 (DRAPES) ×1 IMPLANT
DRAPE U-SHAPE 47X51 STRL (DRAPES) ×1 IMPLANT
DRILL 4.3 CALIBRATED (DRILL) ×1
DRILL FREE TARG 3.3MM (DRILL) ×1
DRSG ADAPTIC 3X8 NADH LF (GAUZE/BANDAGES/DRESSINGS) ×1 IMPLANT
DRSG MEPITEL 4X7.2 (GAUZE/BANDAGES/DRESSINGS) IMPLANT
DURAPREP 26ML APPLICATOR (WOUND CARE) ×1 IMPLANT
ELECT REM PT RETURN 9FT ADLT (ELECTROSURGICAL) ×1
ELECTRODE REM PT RTRN 9FT ADLT (ELECTROSURGICAL) ×1 IMPLANT
GAUZE PAD ABD 8X10 STRL (GAUZE/BANDAGES/DRESSINGS) ×2 IMPLANT
GAUZE SPONGE 4X4 12PLY STRL (GAUZE/BANDAGES/DRESSINGS) ×1 IMPLANT
GLOVE BIO SURGEON STRL SZ7 (GLOVE) ×1 IMPLANT
GLOVE BIO SURGEON STRL SZ7.5 (GLOVE) IMPLANT
GLOVE BIO SURGEON STRL SZ8 (GLOVE) ×2 IMPLANT
GLOVE BIOGEL PI IND STRL 7.5 (GLOVE) IMPLANT
GLOVE BIOGEL PI IND STRL 8 (GLOVE) ×1 IMPLANT
GOWN STRL REUS W/ TWL LRG LVL3 (GOWN DISPOSABLE) ×3 IMPLANT
GOWN STRL REUS W/TWL LRG LVL3 (GOWN DISPOSABLE) ×3
GUIDEPIN VERSANAIL DSP 3.2X444 (ORTHOPEDIC DISPOSABLE SUPPLIES) IMPLANT
GUIDEWIRE NATURAL NAIL 3X100 (WIRE) IMPLANT
KIT BASIN OR (CUSTOM PROCEDURE TRAY) ×1 IMPLANT
KIT TURNOVER KIT B (KITS) ×1 IMPLANT
KNOB POSITIONING MACHINED DISP (ORTHOPEDIC DISPOSABLE SUPPLIES) IMPLANT
NAIL IM TIB 8.3X340 (Nail) IMPLANT
PACK ORTHO EXTREMITY (CUSTOM PROCEDURE TRAY) ×1 IMPLANT
PACK UNIVERSAL I (CUSTOM PROCEDURE TRAY) ×1 IMPLANT
PAD ARMBOARD 7.5X6 YLW CONV (MISCELLANEOUS) ×2 IMPLANT
PAD CAST 4YDX4 CTTN HI CHSV (CAST SUPPLIES) ×1 IMPLANT
PADDING CAST COTTON 4X4 STRL (CAST SUPPLIES)
PADDING CAST COTTON 6X4 STRL (CAST SUPPLIES) ×1 IMPLANT
REAMER HEAD TAPER 12.0 (ORTHOPEDIC DISPOSABLE SUPPLIES) IMPLANT
SCREW BONE 5.0X35MM CORTICAL (Screw) IMPLANT
SCREW CORT FT 35X4X3.5XST FX (Screw) IMPLANT
SCREW CORT IM NAIL 4X27.5 (Screw) IMPLANT
SCREW CORT IM NAIL 4X32.5 (Screw) ×1 IMPLANT
SCREW HEX HEAD 3.5X42.5 (Screw) IMPLANT
SPONGE T-LAP 18X18 ~~LOC~~+RFID (SPONGE) ×1 IMPLANT
STAPLER VISISTAT 35W (STAPLE) IMPLANT
SUT ETHILON 3 0 PS 1 (SUTURE) ×1 IMPLANT
SUT MON AB 2-0 CT1 27 (SUTURE) ×1 IMPLANT
SUT PDS AB 2-0 CT1 27 (SUTURE) IMPLANT
SUT VIC AB 0 CT1 27 (SUTURE) ×1
SUT VIC AB 0 CT1 27XBRD ANBCTR (SUTURE) IMPLANT
TOWEL GREEN STERILE (TOWEL DISPOSABLE) ×1 IMPLANT
TOWEL GREEN STERILE FF (TOWEL DISPOSABLE) ×1 IMPLANT
TUBE CONNECTING 12X1/4 (SUCTIONS) ×1 IMPLANT
UNDERPAD 30X36 HEAVY ABSORB (UNDERPADS AND DIAPERS) ×1 IMPLANT
YANKAUER SUCT BULB TIP NO VENT (SUCTIONS) ×1 IMPLANT

## 2022-11-12 NOTE — Progress Notes (Signed)
Orthopedic Tech Progress Note Patient Details:  Deanna Yang Nov 18, 1961 [id number]  Ortho Devices Type of Ortho Device: Long leg splint, Stirrup splint Ortho Device/Splint Location: RLE Ortho Device/Splint Interventions: Ordered, Adjustment, Application  NT and ED MD assisted holding pt leg, but pt was in a lot of pain and didn't stay still during the application did it to the best of my ability. Post Interventions Patient Tolerated: Poor  Deanna Yang 11/12/2022, 6:09 AM

## 2022-11-12 NOTE — H&P (Signed)
H&P Update: ? ?-History and Physical Reviewed ? ?-Patient has been re-examined ? ?-No change in the plan of care ? ?-The risks and benefits were presented and reviewed. The risks due to hardware failure/irritation, new/persistent infection, stiffness, nerve/vessel/tendon injury, nonunion/malunion, wound healing issues, development of arthritis, failure of this surgery, possibility of external fixation with delayed definitive surgery, need for further surgery, thromboembolic events, anesthesia/medical complications, amputation, death among others were discussed. The patient acknowledged the explanation, agreed to proceed with the plan and a consent was signed. ? ?Deanna Yang ? ?

## 2022-11-12 NOTE — ED Notes (Signed)
Report given to OR.

## 2022-11-12 NOTE — Anesthesia Procedure Notes (Signed)
Procedure Name: Intubation Date/Time: 11/12/2022 9:46 AM  Performed by: Betha Loa, CRNAPre-anesthesia Checklist: Patient identified, Emergency Drugs available, Suction available and Patient being monitored Patient Re-evaluated:Patient Re-evaluated prior to induction Oxygen Delivery Method: Circle System Utilized Preoxygenation: Pre-oxygenation with 100% oxygen Induction Type: IV induction Ventilation: Mask ventilation without difficulty Laryngoscope Size: Mac and 3 Grade View: Grade III Tube type: Oral Tube size: 7.0 mm Number of attempts: 1 Airway Equipment and Method: Stylet and Oral airway Placement Confirmation: ETT inserted through vocal cords under direct vision, positive ETCO2 and breath sounds checked- equal and bilateral Secured at: 22 cm Tube secured with: Tape Dental Injury: Teeth and Oropharynx as per pre-operative assessment  Comments: Pt w h/o ACDF--pt remains on stretcher for induction/intubation 2* RLE fracture.  Head/neck remain in midline and neutral position of comfort per pt for induction and intubation.

## 2022-11-12 NOTE — Plan of Care (Signed)

## 2022-11-12 NOTE — Plan of Care (Signed)

## 2022-11-12 NOTE — Anesthesia Preprocedure Evaluation (Addendum)
Anesthesia Evaluation  Patient identified by MRN, date of birth, ID band Patient awake    Reviewed: Allergy & Precautions, NPO status , Patient's Chart, lab work & pertinent test results  History of Anesthesia Complications Negative for: history of anesthetic complications  Airway Mallampati: II  TM Distance: >3 FB Neck ROM: Limited    Dental  (+) Dental Advisory Given, Teeth Intact   Pulmonary sleep apnea , COPD (prn O2),  COPD inhaler, former smoker   Pulmonary exam normal        Cardiovascular hypertension, Pt. on medications Normal cardiovascular exam     Neuro/Psych  Headaches PSYCHIATRIC DISORDERS Anxiety Depression Bipolar Disorder    Neuromuscular disease    GI/Hepatic ,GERD  Medicated and Controlled,,  Endo/Other  negative endocrine ROS    Renal/GU CRFRenal disease     Musculoskeletal  (+)  Fibromyalgia -, narcotic dependent  Abdominal   Peds  Hematology negative hematology ROS (+)   Anesthesia Other Findings HSV CFIDS (chronic fatigue and immune dysfunction syndrome)  Reproductive/Obstetrics                             Anesthesia Physical Anesthesia Plan  ASA: 3  Anesthesia Plan: General   Post-op Pain Management: Ofirmev IV (intra-op)* and Ketamine IV*   Induction: Intravenous  PONV Risk Score and Plan: 3 and Treatment may vary due to age or medical condition, Ondansetron, Dexamethasone and Midazolam  Airway Management Planned: Oral ETT  Additional Equipment: None  Intra-op Plan:   Post-operative Plan: Extubation in OR  Informed Consent: I have reviewed the patients History and Physical, chart, labs and discussed the procedure including the risks, benefits and alternatives for the proposed anesthesia with the patient or authorized representative who has indicated his/her understanding and acceptance.     Dental advisory given  Plan Discussed with: CRNA and  Anesthesiologist  Anesthesia Plan Comments:        Anesthesia Quick Evaluation

## 2022-11-12 NOTE — ED Notes (Signed)
Patient changed into gown, clothing and jewelry removed

## 2022-11-12 NOTE — Care Plan (Signed)
Orthopaedic Surgery Plan of Care Note  -received consult right tibia-fibula shaft fx -npo since 9 pm 12/1 -plan for OR today 12/2 9 AM -long leg splint, NWB RLE -CT scans completed -full consult note to follow -extensive comorbidities and multiple prior admissions to hospitalist services, reviewed with ER physician who is admitting to Medicine -preop orders placed  Netta Cedars, MD Orthopaedic Surgery [facility]

## 2022-11-12 NOTE — ED Triage Notes (Addendum)
Patient BIB EMS from home for fall, deformity to Right below knee. Twisted leg felt a pop and then fell. Fentanyl that did not help with pain. Ketamine 17.4mg /7mins. VSS. 22g right leg

## 2022-11-12 NOTE — Progress Notes (Signed)
Subjective: Day of Surgery Procedure(s) (LRB): INTRAMEDULLARY (IM) NAIL TIBIAL (Right)  Patient reports pain as well controlled. She is comfortable in PACU and expresses appreciation for care.     Objective:   VITALS:  Temp:  [97.7 F (36.5 C)-98.7 F (37.1 C)] 97.7 F (36.5 C) (12/02 1300) Pulse Rate:  [61-103] 88 (12/02 1300) Resp:  [12-22] 12 (12/02 1300) BP: (116-160)/(71-138) 138/87 (12/02 1300) SpO2:  [91 %-98 %] 92 % (12/02 1300) Weight:  [86.2 kg] 86.2 kg (12/02 0933)  Gen: AAOx3, NAD  Right lower extremity: CAM boot in place Dressings c/d/i Compartments soft compressible No pain with passive stretch APF/ADF/EHL 5/5 SILT throughout DP, PT 2+ to palp CR<2s    LABS Recent Labs    11/12/22 0418  HGB 11.2*  WBC 13.3*  PLT 354   Recent Labs    11/12/22 0418  NA 139  K 3.8  CL 104  CO2 24  BUN 15  CREATININE 1.87*  GLUCOSE 116*   Recent Labs    11/12/22 0437  INR 1.0     Assessment/Plan: Day of Surgery Procedure(s) (LRB): INTRAMEDULLARY (IM) NAIL TIBIAL (Right)  -transfer to RNF when stable (Medicine) -postop vancomycin & gram neg abx per medicine team for surgical prophylaxis -WBAT operative extremity with walker and 1-2 person assist at all times -maintain surgical dressings until POD7, then apply dry sterile gauze dressings -DVT ppx: per primary team preference -follow up as outpatient in 7-10 days for wound check -staples out in 3 weeks in outpatient office -PT/OT eval  Netta Cedars 11/12/2022, 2:00 PM

## 2022-11-12 NOTE — Progress Notes (Signed)
Orthopedic Tech Progress Note Patient Details:  Deanna Yang 09-28-1961 [id number] Medium CAM Dan Humphreys was delivered to the OR Desk Ortho Devices Type of Ortho Device: CAM walker Ortho Device/Splint Location: RLE Ortho Device/Splint Interventions: Ordered   Post Interventions Patient Tolerated: Poor  Deanna Yang 11/12/2022, 11:18 AM

## 2022-11-12 NOTE — Transfer of Care (Signed)
Immediate Anesthesia Transfer of Care Note  Patient: Deanna Yang  Procedure(s) Performed: INTRAMEDULLARY (IM) NAIL TIBIAL (Right)  Patient Location: PACU  Anesthesia Type:General  Level of Consciousness: awake, alert , oriented, patient cooperative, and responds to stimulation  Airway & Oxygen Therapy: Patient Spontanous Breathing and Patient connected to face mask oxygen. Neb ordered in PACU for wheezing  Post-op Assessment: Report given to RN, Post -op Vital signs reviewed and stable, and Patient moving all extremities X 4  Post vital signs: Reviewed and stable  Last Vitals:  Vitals Value Taken Time  BP 128/80 11/12/22 1203  Temp    Pulse 95 11/12/22 1209  Resp 16 11/12/22 1209  SpO2 99 % 11/12/22 1209  Vitals shown include unvalidated device data.  Last Pain:  Vitals:   11/12/22 0833  TempSrc:   PainSc: 9          Complications: No notable events documented.

## 2022-11-12 NOTE — Op Note (Addendum)
11/12/2022  11:59 AM   PATIENT: Deanna Yang  61 y.o. female  MRN: [id number]   PRE-OPERATIVE DIAGNOSIS:   Closed right comminuted displaced tibia and fibula proximal shaft fractures    POST-OPERATIVE DIAGNOSIS:   Same   PROCEDURE: 1] Open reduction internal fixation of right tibia shaft fracture with intramedullary nail fixation 2] Closed treatment of right fibula shaft fracture without internal fixation   SURGEON:  Netta Cedars, MD   ASSISTANT: None   ANESTHESIA: General, regional   EBL: Minimal   TOURNIQUET:    Total Tourniquet Time Documented: Thigh (Right) - 27 minutes Total: Thigh (Right) - 27 minutes    COMPLICATIONS: None apparent   DISPOSITION: Extubated, awake and stable to recovery.   INDICATION FOR PROCEDURE: Ortho consult for closed right comminuted displaced tibia and fibula proximal shaft fractures sustained on 11/12/22. Per patient report, she twisted her leg getting into bed and felt a pop with severe pain. She has a history of frequent falls and only recently had a right wrist ORIF (Dr. Merlyn Lot - 04/07/22). Denies numbness/tingling.   PMH notable for COPD (home O2 3L), OSA, NAFLD, CKD stage 2, GERD, frequent falls, ?vertigo, former smoker (quit 2008), schizophrenia, depression, HTN, HLD. Admitted to Medicine team.   The patient is an independent community ambulator without assistive device.  We discussed the diagnosis, alternative treatment options, risks and benefits of the above surgical intervention, as well as alternative non-operative treatments. All questions/concerns were addressed and the patient/family demonstrated appropriate understanding of the diagnosis, the procedure, the postoperative course, and overall prognosis. The patient wished to proceed with surgical intervention and signed an informed surgical consent as such, in each others presence prior to surgery.   PROCEDURE IN DETAIL: After preoperative consent was obtained  and the correct operative site was identified, the patient was brought to the operating room supine on stretcher and transferred onto operating table. General anesthesia was induced. Preoperative antibiotics were administered. Surgical timeout was taken. The patient was then positioned supine with an ipsilateral hip bump. The operative lower extremity was prepped and draped in standard sterile fashion with a tourniquet around the thigh. The extremity was exsanguinated and the tourniquet was inflated to 275 mmHg.  We began by attempting closed reduction of the tibia shaft fracture. However, the fracture was noted to be significantly malrotated and shortened. Therefore, an anteromedial incision was made directly over the fracture and interposed muscle was removed. This allowed the fracture to mobilize more freely.   We then made a standard suprapatellar approach. Dissection was carried down to the quadriceps and this was split down to the proximal pole of the patella. Synovial fluid was encountered. The entry guide was passed through this approach across the patellofemoral joint. A guide pin was placed along medial edge of the lateral tibial spine and anterior to the tibial articular surface as verified by intraoperative fluoroscopy. The pin was advanced into the proximal tibia and then entry reamer was used over this pin. The pin was removed and a ball tip guide wire was placed.   Due to significant fracture deformity, a finger awl was utilized to internally reduce the tibia fracture until the appropriate rotation was achieved. This was verified both clinically and radiographically by looking at the lower limb axis and fracture lines.   The guide wire was then carried down to the level of the distal tibia physeal scar after ensuring successful passage across the tibial fractures. The measuring guide was used to determine appropriate length. Prior  to reaming, the tourniquet was deflated. We then sequentially  reamed the tibia shaft up to 10.0 to receive a size 8.3 nail x 340 mm. The nail was then implanted while ensuring maintained reduction of the tibial shaft fractures throughout. We then used the proximal jig to insert two interlock screws above. These were noted to have excellent purchase. We implanted two distal interlock screws using freehand technique with intraoperative fluoroscopy. These had excellent purchase.    We then confirmed closed reduction of the extensively comminuted proximal fibula fracture under intraoperative fluoroscopy.   A manual external rotation stress radiograph was obtained and demonstrated symmetry of the ankle mortise with complete stability to testing. Therefore, it was decided to manage the fibula fractures closed.   The surgical sites were thoroughly irrigated and hemostasis achieved. The quadriceps was repaired using 0 vicryl and the deep layers were closed using 2-0 PDS. The skin was closed without tension using staples.   Excellent capillary refill, bounding peripheral pulses and soft compartments were noted in the operative extremity following surgery. Due to patient's adhesive allergies, we utilized mepitel dressings with sterile gauze. A CAM boot was applied. The patient was awakened from anesthesia and transported to the recovery room in stable condition.     FOLLOW UP PLAN: -transfer to PACU, then RNF (Medicine) -postop vancomycin for surgical prophylaxis -WBAT operative extremity with walker and 1-2 person assist at all times -maintain surgical dressings until POD7, then apply dry sterile gauze dressings -DVT ppx: per primary team preference -follow up as outpatient in 7-10 days for wound check -staples out in 3 weeks in outpatient office -PT/OT eval   RADIOGRAPHS: 1] Right Tibia XR AP, lateral, oblique and stress radiographs of the right tibia were obtained intraoperatively. These showed interval reduction and fixation of the fractures. All hardware is  appropriately positioned and of the appropriate lengths. No other acute injuries are noted.  2] Right Ankle XR AP, lateral, oblique and stress radiographs of the right ankle were obtained intraoperatively. Manual stress radiographs were taken and the ankle mortise and tibiofibular relationship were noted to be stable following fixation. All hardware is appropriately positioned and of the appropriate lengths. No other acute injuries are noted.  Netta Cedars Orthopaedic Surgery [facility]

## 2022-11-12 NOTE — ED Provider Notes (Signed)
[facility] EMERGENCY DEPARTMENT Provider Note   CSN: [id number] Arrival date & time: 11/12/22  0403     History  Chief Complaint  Patient presents with   Deanna Yang is a 61 y.o. female.  The history is provided by the patient.   Patient presents after a fall and injured her right leg  Patient reports she got out of bed to check on a noise outside.  When she tried to get back into bed her right leg twisted and she collapsed.  She may have hit her head but is unknown if she had LOC.  She reports significant pain and swelling to her right leg. She reports she has had recent increasing shortness of breath due to her asthma. She also reports neck pain.  No chest or abdominal pain. She is not on anticoagulation Patient reports she was at her baseline prior to the fall No chest pain  Home Medications Prior to Admission medications   Medication Sig Start Date End Date Taking? Authorizing Provider  acyclovir ointment (ZOVIRAX) 5 % Apply 1 application topically every 3 (three) hours as needed (mouth sores).  01/27/18   [provider]  albuterol (VENTOLIN HFA) 108 (90 Base) MCG/ACT inhaler Inhale 2 puffs into the lungs every 4 (four) hours as needed for wheezing. 06/30/17   [provider]  amLODipine (NORVASC) 10 MG tablet Take 10 mg by mouth daily.  07/05/17   [provider]  bisacodyl (DULCOLAX) 10 MG suppository Place 1 suppository (10 mg total) rectally daily as needed for moderate constipation. 08/09/18   Joseph Art, DO  buPROPion (WELLBUTRIN XL) 300 MG 24 hr tablet Take 1 tablet (300 mg total) by mouth daily. 01/09/18   Thresa Ross, MD  busPIRone (BUSPAR) 15 MG tablet Take 15 mg by mouth 2 (two) times daily as needed. 03/11/22   [provider]  chlorhexidine (PERIDEX) 0.12 % solution 15 mLs by Mouth Rinse route as needed. 10/27/21   [provider]  fluticasone (FLONASE) 50 MCG/ACT nasal spray Place 1  spray into both nostrils 2 (two) times daily. 05/31/18   [provider]  hydrOXYzine (ATARAX/VISTARIL) 25 MG tablet Take 1 tablet (25 mg total) by mouth every 6 (six) hours as needed for anxiety (mild anxiety). Patient taking differently: Take 25 mg by mouth every 6 (six) hours as needed for anxiety (panic attacks). 07/24/17   Shaune Pollack, MD  Immune Globulin 10% [facility]) 10 GM/100ML SOLN Inject into the vein every 30 (thirty) days.    [provider]  ipratropium-albuterol (DUONEB) 0.5-2.5 (3) MG/3ML SOLN Inhale 3 mLs into the lungs every 4 (four) hours as needed for wheezing or shortness of breath. 04/15/17   [provider]  ketoconazole (NIZORAL) 2 % cream Apply 1 application. topically as needed. 01/25/22   [provider]  LINZESS 145 MCG CAPS capsule Take 145 mcg by mouth daily. 03/11/22   [provider]  montelukast (SINGULAIR) 10 MG tablet Take 10 mg by mouth at bedtime. 05/01/18   [provider]  morphine (MS CONTIN) 30 MG 12 hr tablet Take 1 tablet (30 mg total) by mouth every 12 (twelve) hours. 04/28/17   Narda Bonds, MD  NYSTATIN powder Apply 1 application topically 3 (three) times daily as needed (blistering sores under breast).  05/23/17   [provider]  oxyCODONE (ROXICODONE) 5 MG immediate release tablet 1 tab PO q6 hours prn pain 04/07/22   Betha Loa,  MD  oxyCODONE-acetaminophen (PERCOCET) 10-325 MG tablet Take 1 tablet by mouth every 6 (six) hours as needed for pain. Hold for sedation 08/09/18   Joseph Art, DO  pantoprazole (PROTONIX) 40 MG tablet Take 40 mg by mouth every morning. 03/11/22   [provider]  prazosin (MINIPRESS) 2 MG capsule Take 2 mg by mouth at bedtime.  05/03/18   [provider]  pregabalin (LYRICA) 25 MG capsule Take 1 capsule (25 mg total) by mouth 2 (two) times daily. 04/01/22 04/01/23  Emeline General, MD  promethazine (PHENERGAN) 25 MG tablet Take 25 mg by mouth every 8  (eight) hours as needed. 03/11/22   [provider]  QUEtiapine (SEROQUEL) 25 MG tablet Take 75 mg by mouth at bedtime. 08/06/18   [provider]  tiotropium (SPIRIVA HANDIHALER) 18 MCG inhalation capsule Place 1 capsule (18 mcg total) into inhaler and inhale daily. 04/01/22 04/01/23  Emeline General, MD  tiZANidine (ZANAFLEX) 4 MG tablet Take 8 mg by mouth 3 (three) times daily. 03/15/22   [provider]  TRELEGY ELLIPTA 100-62.5-25 MCG/ACT AEPB Inhale 1 puff into the lungs daily. 03/22/22   [provider]  amphetamine-dextroamphetamine (ADDERALL) 30 MG tablet Take 30 mg by mouth 2 (two) times daily.  10/02/15  [provider]  FLUoxetine (PROZAC) 40 MG capsule Take 1 capsule (40 mg total) by mouth daily. 10/02/15 01/10/17  Thresa Ross, MD      Allergies    Ambien [zolpidem tartrate], Beta adrenergic blockers, Fish allergy, Iodinated contrast media, Latex, Metoprolol, Penicillins, Red dye, Shellfish allergy, Sulfa antibiotics, Amoxicillin-pot clavulanate, Aspirin, Dye fdc red  [fd&c red #2 (amaranth)], Tape, Talwin [pentazocine], Levofloxacin, and Seroquel [quetiapine fumarate]    Review of Systems   Review of Systems  Respiratory:  Positive for shortness of breath.   Gastrointestinal:  Negative for abdominal pain.  Musculoskeletal:  Positive for joint swelling.    Physical Exam Updated Vital Signs BP (!) 160/80   Pulse 61   Temp 98.1 F (36.7 C) (Oral)   Resp (!) 22   SpO2 95%  Physical Exam CONSTITUTIONAL: Well developed/well nourished, anxious HEAD: Normocephalic/atraumatic EYES: EOMI/PERRL-nystagmus noted likely due to ketamine ENMT: Mucous membranes moist NECK: supple no meningeal signs SPINE/BACK: Mild cervical spinal tenderness, no thoracic or lumbar tenderness No bruising/crepitance/stepoffs noted to spine Patient maintained in spinal precautions/logroll utilized CV: S1/S2 noted, no murmurs/rubs/gallops noted LUNGS: Lungs are  clear to auscultation bilaterally, no apparent distress Chest-no tenderness ABDOMEN: soft, nontender, no bruising NEURO: Pt is awake/alert, she is able to move all extremities except the right leg due to pain.  GCS 15 EXTREMITIES: Obvious close deformity to the right proximal tibia with significant swelling.  Distal pulses intact.  Mild tenderness noted to the right femur All other extremities/joints palpated/ranged and nontender SKIN: warm, color normal PSYCH: Anxious and crying  ED Results / Procedures / Treatments   Labs (all labs ordered are listed, but only abnormal results are displayed) Labs Reviewed  COMPREHENSIVE METABOLIC PANEL - Abnormal; Notable for the following components:      Result Value   Glucose, Bld 116 (*)    Creatinine, Ser 1.87 (*)    Total Protein 5.8 (*)    Albumin 3.1 (*)    GFR, Estimated 30 (*)    All other components within normal limits  CBC - Abnormal; Notable for the following components:   WBC 13.3 (*)    RBC 3.83 (*)    Hemoglobin 11.2 (*)  HCT 34.2 (*)    All other components within normal limits  RESP PANEL BY RT-PCR (FLU A&B, COVID) ARPGX2  ETHANOL  PROTIME-INR  URINALYSIS, ROUTINE W REFLEX MICROSCOPIC  RAPID URINE DRUG SCREEN, HOSP PERFORMED  SAMPLE TO BLOOD BANK    EKG EKG Interpretation  Date/Time:  Saturday November 12 2022 04:11:28 EST Ventricular Rate:  67 PR Interval:  138 QRS Duration: 105 QT Interval:  414 QTC Calculation: 437 R Axis:   68 Text Interpretation: Sinus rhythm Low voltage, extremity and precordial leads Nonspecific T abnrm, anterolateral leads Interpretation limited secondary to artifact Confirmed by Zadie RhineWickline, Symphoni Helbling ([id number]) on 11/12/2022 4:15:45 AM  Radiology CT HEAD WO CONTRAST  Result Date: 11/12/2022 CLINICAL DATA:  61 year old female with history of trauma from a fall. EXAM: CT HEAD WITHOUT CONTRAST CT CERVICAL SPINE WITHOUT CONTRAST TECHNIQUE: Multidetector CT imaging of the head and cervical spine was  performed following the standard protocol without intravenous contrast. Multiplanar CT image reconstructions of the cervical spine were also generated. RADIATION DOSE REDUCTION: This exam was performed according to the departmental dose-optimization program which includes automated exposure control, adjustment of the mA and/or kV according to patient size and/or use of iterative reconstruction technique. COMPARISON:  CT of the head and cervical spine 04/01/2022. FINDINGS: CT HEAD FINDINGS Brain: No evidence of acute infarction, hemorrhage, hydrocephalus, extra-axial collection or mass lesion/mass effect. Vascular: No hyperdense vessel or unexpected calcification. Skull: Normal. Negative for fracture or focal lesion. Sinuses/Orbits: No acute finding. Other: None. CT CERVICAL SPINE FINDINGS Alignment: Normal. Skull base and vertebrae: Postoperative changes of ACDF from C4-C7 are noted with interbody grafts at C4-C5, C5-C6 and C6-C7. No acute fracture. No primary bone lesion or focal pathologic process. Soft tissues and spinal canal: No prevertebral fluid or swelling. No visible canal hematoma. Disc levels: Postoperative changes of ACDF, as above. Multilevel degenerative disc disease, most apparent at C7-T1. Moderate multilevel facet arthropathy bilaterally. Upper chest: Unremarkable. Other: None. IMPRESSION: 1. No evidence of significant acute traumatic injury to the skull, brain or cervical spine. 2. The appearance of the brain is normal. 3. Postoperative changes of ACDF from C4-C7, as above. Electronically Signed   By: Trudie Reedaniel  Entrikin M.D.   On: 11/12/2022 05:59   CT CERVICAL SPINE WO CONTRAST  Result Date: 11/12/2022 CLINICAL DATA:  61 year old female with history of trauma from a fall. EXAM: CT HEAD WITHOUT CONTRAST CT CERVICAL SPINE WITHOUT CONTRAST TECHNIQUE: Multidetector CT imaging of the head and cervical spine was performed following the standard protocol without intravenous contrast. Multiplanar CT  image reconstructions of the cervical spine were also generated. RADIATION DOSE REDUCTION: This exam was performed according to the departmental dose-optimization program which includes automated exposure control, adjustment of the mA and/or kV according to patient size and/or use of iterative reconstruction technique. COMPARISON:  CT of the head and cervical spine 04/01/2022. FINDINGS: CT HEAD FINDINGS Brain: No evidence of acute infarction, hemorrhage, hydrocephalus, extra-axial collection or mass lesion/mass effect. Vascular: No hyperdense vessel or unexpected calcification. Skull: Normal. Negative for fracture or focal lesion. Sinuses/Orbits: No acute finding. Other: None. CT CERVICAL SPINE FINDINGS Alignment: Normal. Skull base and vertebrae: Postoperative changes of ACDF from C4-C7 are noted with interbody grafts at C4-C5, C5-C6 and C6-C7. No acute fracture. No primary bone lesion or focal pathologic process. Soft tissues and spinal canal: No prevertebral fluid or swelling. No visible canal hematoma. Disc levels: Postoperative changes of ACDF, as above. Multilevel degenerative disc disease, most apparent at C7-T1. Moderate multilevel facet arthropathy bilaterally. Upper  chest: Unremarkable. Other: None. IMPRESSION: 1. No evidence of significant acute traumatic injury to the skull, brain or cervical spine. 2. The appearance of the brain is normal. 3. Postoperative changes of ACDF from C4-C7, as above. Electronically Signed   By: Trudie Reed M.D.   On: 11/12/2022 05:59   CT Tibia Fibula Right Wo Contrast  Result Date: 11/12/2022 CLINICAL DATA:  Leg trauma EXAM: CT OF THE LOWER RIGHT EXTREMITY WITHOUT CONTRAST TECHNIQUE: Multidetector CT imaging of the right lower extremity was performed according to the standard protocol. RADIATION DOSE REDUCTION: This exam was performed according to the departmental dose-optimization program which includes automated exposure control, adjustment of the mA and/or kV  according to patient size and/or use of iterative reconstruction technique. COMPARISON:  None Available. FINDINGS: Bones/Joint/Cartilage Transverse fractures through the upper shaft of the tibia and fibula with medial displacement by over 100%. Greater comminution at the fibular fracture. No evidence of underlying bone lesion. Generalized osteopenia. Ligaments Suboptimally assessed by CT. Muscles and Tendons Expected regional soft tissue swelling. No major musculotendinous disruption seen. Soft tissues Expected soft tissue swelling around the fracture. There is a bubble of gas in the shin anterior to the tibial fracture which could reflect open injury, which should be readily apparent on exam. IMPRESSION: Displaced tibia and fibula shaft fractures.  No focal bone lesion. Electronically Signed   By: Tiburcio Pea M.D.   On: 11/12/2022 05:57   DG Chest Port 1 View  Result Date: 11/12/2022 CLINICAL DATA:  Leg fracture EXAM: PORTABLE CHEST 1 VIEW COMPARISON:  04/01/2022 FINDINGS: Stable heart size and mediastinal contours. Mildly low lung volumes with interstitial crowding. There is no edema, consolidation, effusion, or pneumothorax. ACDF hardware. IMPRESSION: Stable exam.  No acute finding. Electronically Signed   By: Tiburcio Pea M.D.   On: 11/12/2022 05:21   DG Femur Portable Min 2 Views Right  Result Date: 11/12/2022 CLINICAL DATA:  Trauma.  Pain after fall EXAM: RIGHT FEMUR PORTABLE 2 VIEW COMPARISON:  None Available. FINDINGS: Known fractures at the level of the leg. No femur fracture or dislocation. Degenerative spurring at the medial knee. IMPRESSION: Negative for femur fracture. Electronically Signed   By: Tiburcio Pea M.D.   On: 11/12/2022 05:21   DG Tibia/Fibula Right Port  Result Date: 11/12/2022 CLINICAL DATA:  Trauma with fall. EXAM: PORTABLE RIGHT TIBIA AND FIBULA - 2 VIEW COMPARISON:  None Available. FINDINGS: Transverse medially displaced fractures through the tibial and fibular  shafts. Generalized osteopenia without visible focal bone lesion. Expected soft tissue swelling. No knee joint effusion. IMPRESSION: Displaced tibial and fibular shaft fractures. Electronically Signed   By: Tiburcio Pea M.D.   On: 11/12/2022 05:20    Procedures .Ortho Injury Treatment  Date/Time: 11/12/2022 6:00 AM  Performed by: Zadie Rhine, MD Authorized by: Zadie Rhine, MD   Consent:    Consent obtained:  Verbal   Consent given by:  PatientImmobilization: splint Splint type: long leg Splint Applied by: ED Provider and Ortho Tech Supplies used: Ortho-Glass Post-procedure neurovascular assessment: post-procedure neurovascularly intact Post-procedure distal perfusion: normal Post-procedure neurological function: normal Post-procedure range of motion: unchanged       Medications Ordered in ED Medications  mupirocin ointment (BACTROBAN) 2 % (has no administration in time range)  fentaNYL (SUBLIMAZE) injection 50 mcg (50 mcg Intravenous Given 11/12/22 0454)  fentaNYL (SUBLIMAZE) injection 50 mcg (50 mcg Intravenous Given 11/12/22 0557)    ED Course/ Medical Decision Making/ A&P Clinical Course as of 11/12/22 0723  Sat Nov 12, 2022  0456 WBC(!): 13.3 Leukocytosis [DW]  0501 Patient with obvious deformity to the right lower extremity.  She has distal pulses.  Sensory and motor are intact distal to the injury.  However history is somewhat difficult as she was given narcotics and ketamine prior to arrival by EMS.  Will expand evaluation to ensure there is no other acute injuries. [DW]  7829 D/w dr Odis Hollingshead.  He recommends long leg splint.  He recomments CT tibia.  He recommends medicine admit  [DW]  0709 Patient with isolated right tib-fib fracture.  She has remained neurovascular intact.  After splint placement, she is able to wiggle her toes and no sensory deficit.  Her right leg has been elevated.  No other signs of acute traumatic injury.  Will consult medicine for  admission.  N.p.o. since around 9 PM last night [DW]  0723 Discussed with internal medicine resident.  Patient will be admitted [DW]    Clinical Course User Index [DW] Zadie Rhine, MD                           Medical Decision Making Amount and/or Complexity of Data Reviewed Labs: ordered. Decision-making details documented in ED Course. Radiology: ordered.  Risk Prescription drug management. Decision regarding hospitalization.   This patient presents to the ED for concern of leg pain, this involves an extensive number of treatment options, and is a complaint that carries with it a high risk of complications and morbidity.  The differential diagnosis includes but is not limited to muscle strain, patellar fracture, tibial plateau fracture, tib-fib fracture  Comorbidities that complicate the patient evaluation: Patient's presentation is complicated by their history of obesity, asthma   Additional history obtained: Records reviewed Care Everywhere/External Records  Lab Tests: I Ordered, and personally interpreted labs.  The pertinent results include: Leukocytosis, renal insufficiency  Imaging Studies ordered: I ordered imaging studies including CT scan head and X-ray right tib-fib   I independently visualized and interpreted imaging which showed right tib-fib fracture I agree with the radiologist interpretation  Cardiac Monitoring: The patient was maintained on a cardiac monitor.  I personally viewed and interpreted the cardiac monitor which showed an underlying rhythm of:  sinus rhythm  Medicines ordered and prescription drug management: I ordered medication including fentanyl for pain Reevaluation of the patient after these medicines showed that the patient    improved   Critical Interventions:   admission for operative management  Consultations Obtained: I requested consultation with the consultant orthopedics , and discussed  findings as well as pertinent plan -  they recommend: Admit to medical service, orthopedics consulting.  Reevaluation: After the interventions noted above, I reevaluated the patient and found that they have :improved  Complexity of problems addressed: Patient's presentation is most consistent with  acute presentation with potential threat to life or bodily function  Disposition: After consideration of the diagnostic results and the patient's response to treatment,  I feel that the patent would benefit from admission   .           Final Clinical Impression(s) / ED Diagnoses Final diagnoses:  Closed fracture of right tibia and fibula, initial encounter    Rx / DC Orders ED Discharge Orders     None         Zadie Rhine, MD 11/12/22 [id number]

## 2022-11-12 NOTE — H&P (Signed)
Date: 11/12/2022               Patient Name:  Deanna Yang MRN: [id number]  DOB: [birth year] Age / Sex: 61 y.o., female   PCP: Eartha Inch, MD         Medical Service: Internal Medicine Teaching Service         Attending Physician: Dr. Ginnie Smart, MD    First Contact: Dr. Burnice Logan Pager: [phone]  Second Contact: Dr. Ladora Daniel  Pager: [phone]       After Hours (After 5p/  First Contact Pager: [phone]  weekends / holidays): Second Contact Pager: [phone]   Chief Concern: Leg pain  History of Present Illness:   Deanna Yang is a 61 year old female living with hypertension, hyperlipidemia, COPD, depression, OSA, schizophrenia who presents to the emergency room after a fall.  Patient report getting up from bed to check on her window.  On the way back, she tried to lift her right leg up the bed and her a pop sound then leg pain.  She is unsure of twisting her leg.  This resulted in a fall that she hit her head on the ground.  She denies loss of consciousness.  Patient reports frequent fall for the last 4 years.  She denies any history of CVA.  Patient was seen by her PCP who placed a referral to neurology recently.  Patient reports feeling lightheadedness with positional change but not last night.  She sometimes had palpitation, chest pain or shortness of breath but unsure if this is associated with her fall.  Patient report adherence to medications.  She is on home 3 L of oxygen.  She is taking morphine, Percocet and tizanidine for chronic pain from multiple surgeries.  Patient denies any heart issue in the past.  York Spaniel that her breathing is stable now without any issue or wheezing.  In the ED, CT head and cervical was negative for acute change.  CT showed displaced tibia and fibula shaft fractures.  Splint was placed by the ED provider.  Orthopedic was consulted.  I [facility] was consulted to manage her medical issues.  Meds:  -Amlodipine -Morphine 30 mg twice  daily -Percocet 10-325 mg 4 times daily -Tizanidine 8 mg 3 times daily -Bupropion -BuSpar -Seroquel  Allergies: Allergies as of 11/12/2022 - Review Complete 11/12/2022  Allergen Reaction Noted   Ambien [zolpidem tartrate] Shortness Of Breath and Swelling 06/18/2012   Beta adrenergic blockers Anaphylaxis 12/11/2013   Fish allergy Shortness Of Breath and Rash 06/05/2014   Iodinated contrast media Itching 10/20/2014   Latex Itching and Other (See Comments) 04/01/2013   Metoprolol Other (See Comments) 06/18/2012   Penicillins Shortness Of Breath, Rash, and Other (See Comments) 05/30/2012   Red dye Hives and Shortness Of Breath 06/18/2012   Shellfish allergy Anaphylaxis 10/14/2014   Sulfa antibiotics Hives and Shortness Of Breath 05/30/2012   Amoxicillin-pot clavulanate Nausea And Vomiting 07/07/2016   Aspirin Other (See Comments) 07/07/2016   Dye fdc red  [fd&c red #2 (amaranth)] Hives 03/12/2013   Tape Dermatitis and Rash 04/01/2013   Talwin [pentazocine] Other (See Comments) 12/16/2017   Levofloxacin Rash and Other (See Comments) 05/30/2012   Seroquel [quetiapine fumarate] Other (See Comments) 07/17/2015   Past Medical History:  Diagnosis Date   Anxiety    Arthritis    Chronic kidney disease    stage 2   COPD (chronic obstructive pulmonary disease) (HCC)    Depression    Dyspnea  On 3L oxygen when laying down   Fatty liver 07/23/2018   Fibromyalgia    GERD (gastroesophageal reflux disease)    Headache    HSV infection    Hypertension    Manic depression (HCC)    Neuropathy    hands   OSA (obstructive sleep apnea)    does not use CPAP   Pneumonia    "couple of times"    Family History:  Family History  Problem Relation Age of Onset   Alcohol abuse Mother    Alcohol abuse Father    Depression Sister    Dementia Neg Hx      Social History:  -By herself.  Her mother lives next-door  -Quit smoking 6 years ago.  Used to smoke 1 pack a day -No alcohol  use -Quit cocaine use 6 years ago -PCP: Family medicine at [facility]  Review of Systems: A complete ROS was negative except as per HPI.   Physical Exam: Blood pressure 134/81, pulse 64, temperature 98.1 F (36.7 C), temperature source Oral, resp. rate 20, SpO2 97 %. Physical Exam Constitutional:      General: She is in acute distress (Due to pain).  HENT:     Mouth/Throat:     Mouth: Mucous membranes are dry.  Eyes:     General:        Right eye: No discharge.        Left eye: No discharge.     Conjunctiva/sclera: Conjunctivae normal.     Pupils: Pupils are equal, round, and reactive to Rominger.  Cardiovascular:     Rate and Rhythm: Normal rate and regular rhythm.  Pulmonary:     Effort: Pulmonary effort is normal.     Comments: Unable to auscultate posterior lungs.  No audible wheezing heard. Abdominal:     General: Bowel sounds are normal. There is no distension.     Palpations: Abdomen is soft.     Tenderness: There is no abdominal tenderness.  Musculoskeletal:     Cervical back: Normal range of motion and neck supple. Tenderness (Tenderness to palpation of the paraspinal muscle) present.     Comments: No abnormality of bilateral shoulders, elbow, wrist.  Left lower extremity has good range of motion.  Right lower extremity splinted and bandaged.  Patient is able to move her right toes.  Skin:    General: Skin is warm.  Neurological:     General: No focal deficit present.     Mental Status: She is alert.  Psychiatric:        Mood and Affect: Mood normal.        Behavior: Behavior normal.      EKG: personally reviewed my interpretation is sinus rhythm  Assessment & Plan by Problem: Principal Problem:   Traumatic closed displaced fracture of right tibial plafond with fibula Active Problems:   Essential (primary) hypertension   COPD (chronic obstructive pulmonary disease) (HCC)   AKI (acute kidney injury) (HCC)   Depression   Chronic pain syndrome  Deanna Yang is  a 61 year old female living with hypertension, hyperlipidemia, COPD, depression, OSA, schizophrenia who presents to the emergency room after a fall, admitted for right tib-fib fracture  Traumatic displaced fracture of right tib-fib Orthopedic was consulted in the emergency room.  Likely will need surgery today.  N.p.o. for now.   This seems like a pathological fracture with falling from standing height.  Patient does have evidence of osteopenia CT scan. -Appreciate orthopedic assistance -Pain control with Dilaudid  given her AKI -N.p.o. -Check vitamin D level.  Will probably need bisphosphonate therapy after surgery  Frequent falls This is a chronic problem for her over the last 4 years.  His PCP placed a referral to neurology.  Patient describes symptoms of orthostatic hypotension.  Unclear of cardiac causes but unlikely.  She is also on many centrally acting medications which can also contribute. -Placed on telemetry -Will check orthostatic after surgery -PT/OT after surgery -Will need to discuss with PCP about possible minimizing her centrally acting medications.  AKI on CKD Seems that her baseline creatinine is about 1.2.  Patient appears dehydrated on exam. -Start LR fluid while n.p.o. -Check BMP in a.m.  Hypertension Holding home meds while n.p.o.  COPD Resume Trelegy and DuoNebs as needed  OSA She currently is not using a CPAP at home.  Will need to get one fitted.  Chronic pain syndrome Patient is taking Percocet, morphine and tizanidine.  Confirmed with PDMP.  Prescribed by a pain clinic in [place].  Will resume after surgery.  Depression Schizophrenia Resume home meds after surgery  N.p.o. Full code IVF: LR DVT: SCD pending surgery  Dispo: Admit patient to Inpatient with expected length of stay greater than 2 midnights.  Signed: Doran Stabler, DO 11/12/2022, 8:17 AM  Pager: [phone] After 5pm on weekdays and 1pm on weekends: On Call pager: [phone]

## 2022-11-12 NOTE — Consult Note (Signed)
Patient ID: Deanna Yang MRN: [id number] DOB/AGE: [birth year] 61 y.o.  Admit date: 11/12/2022  Admission Diagnoses:  Principal Problem:   Traumatic closed displaced fracture of right tibial plafond with fibula Active Problems:   Essential (primary) hypertension   COPD (chronic obstructive pulmonary disease) (HCC)   AKI (acute kidney injury) (HCC)   Depression   Chronic pain syndrome   HPI: Ortho consult for closed right comminuted displaced tibia and fibula proximal shaft fractures sustained on 11/12/22. Per patient report, she twisted her leg getting into bed and felt a pop with severe pain. She has a history of frequent falls and only recently had a right wrist ORIF (Dr. Merlyn Lot - 04/07/22). Denies numbness/tingling.  PMH notable for COPD (home O2 3L), OSA, NAFLD, CKD stage 2, GERD, frequent falls, ?vertigo, former smoker (quit 2008), schizophrenia, depression, HTN, HLD. Admitted to Medicine team.  The patient is an independent community ambulator without assistive device.  Past Medical History: Past Medical History:  Diagnosis Date   Anxiety    Arthritis    Chronic kidney disease    stage 2   COPD (chronic obstructive pulmonary disease) (HCC)    Depression    Dyspnea    On 3L oxygen when laying down   Fatty liver 07/23/2018   Fibromyalgia    GERD (gastroesophageal reflux disease)    Headache    HSV infection    Hypertension    Manic depression (HCC)    Neuropathy    hands   OSA (obstructive sleep apnea)    does not use CPAP   Pneumonia    "couple of times"    Surgical History: Past Surgical History:  Procedure Laterality Date   ABDOMINAL SURGERY     APPENDECTOMY     BACK SURGERY     BREAST REDUCTION SURGERY     with lift   CARPAL TUNNEL RELEASE Bilateral    CESAREAN SECTION     CHOLECYSTECTOMY     COLONOSCOPY WITH PROPOFOL     FLEXOR TENOTOMY  Right 04/07/2022   Procedure: BRACHIORADIALIS RELEASE;  Surgeon: Betha Loa, MD;  Location: [facility] OR;   Service: Orthopedics;  Laterality: Right;   HERNIA REPAIR     JOINT REPLACEMENT Left    knee   NECK SURGERY     with rod   OPEN REDUCTION INTERNAL FIXATION (ORIF) DISTAL RADIAL FRACTURE Right 04/07/2022   Procedure: OPEN REDUCTION INTERNAL FIXATION (ORIF) RIGHT DISTAL RADIUS FRACTURE;  Surgeon: Betha Loa, MD;  Location: [facility] OR;  Service: Orthopedics;  Laterality: Right;  75 MIN   TONSILLECTOMY     TOOTH EXTRACTION     with sedation   UPPER GI ENDOSCOPY      Family History: Family History  Problem Relation Age of Onset   Alcohol abuse Mother    Alcohol abuse Father    Depression Sister    Dementia Neg Hx     Social History: Social History   Socioeconomic History   Marital status: Legally Separated    Spouse name: Not on file   Number of children: Not on file   Years of education: Not on file   Highest education level: Not on file  Occupational History   Not on file  Tobacco Use   Smoking status: Former    Packs/day: 1.00    Types: Cigarettes    Quit date: 01/17/2017    Years since quitting: 5.8   Smokeless tobacco: Never  Vaping Use   Vaping Use: Never used  Substance and Sexual Activity   Alcohol use: No    Comment: last use 2-3 years ago   Drug use: No   Sexual activity: Not Currently    Birth control/protection: Post-menopausal  Other Topics Concern   Not on file  Social History Narrative   Not on file   Social Determinants of Health   Financial Resource Strain: Not on file  Food Insecurity: Not on file  Transportation Needs: Not on file  Physical Activity: Not on file  Stress: Not on file  Social Connections: Not on file  Intimate Partner Violence: Not on file    Allergies: Ambien [zolpidem tartrate], Beta adrenergic blockers, Fish allergy, Iodinated contrast media, Latex, Metoprolol, Penicillins, Red dye, Shellfish allergy, Sulfa antibiotics, Amoxicillin-pot clavulanate, Aspirin, Dye fdc red  [fd&c red #2 (amaranth)], Tape, Talwin [pentazocine],  Levofloxacin, and Seroquel [quetiapine fumarate]  Medications: I have reviewed the patient's current medications.  Vital Signs: Patient Vitals for the past 24 hrs:  BP Temp Temp src Pulse Resp SpO2  11/12/22 0758 134/81 98.1 F (36.7 C) Oral 64 20 97 %  11/12/22 0430 -- -- -- 61 (!) 22 95 %  11/12/22 0415 (!) 160/80 -- -- 66 20 93 %  11/12/22 0412 -- 98.1 F (36.7 C) Oral -- -- --  11/12/22 0411 -- -- -- 67 -- 94 %  11/12/22 0410 (!) 155/138 -- -- 66 17 97 %    Radiology: CT HEAD WO CONTRAST  Result Date: 11/12/2022 CLINICAL DATA:  61 year old female with history of trauma from a fall. EXAM: CT HEAD WITHOUT CONTRAST CT CERVICAL SPINE WITHOUT CONTRAST TECHNIQUE: Multidetector CT imaging of the head and cervical spine was performed following the standard protocol without intravenous contrast. Multiplanar CT image reconstructions of the cervical spine were also generated. RADIATION DOSE REDUCTION: This exam was performed according to the departmental dose-optimization program which includes automated exposure control, adjustment of the mA and/or kV according to patient size and/or use of iterative reconstruction technique. COMPARISON:  CT of the head and cervical spine 04/01/2022. FINDINGS: CT HEAD FINDINGS Brain: No evidence of acute infarction, hemorrhage, hydrocephalus, extra-axial collection or mass lesion/mass effect. Vascular: No hyperdense vessel or unexpected calcification. Skull: Normal. Negative for fracture or focal lesion. Sinuses/Orbits: No acute finding. Other: None. CT CERVICAL SPINE FINDINGS Alignment: Normal. Skull base and vertebrae: Postoperative changes of ACDF from C4-C7 are noted with interbody grafts at C4-C5, C5-C6 and C6-C7. No acute fracture. No primary bone lesion or focal pathologic process. Soft tissues and spinal canal: No prevertebral fluid or swelling. No visible canal hematoma. Disc levels: Postoperative changes of ACDF, as above. Multilevel degenerative disc  disease, most apparent at C7-T1. Moderate multilevel facet arthropathy bilaterally. Upper chest: Unremarkable. Other: None. IMPRESSION: 1. No evidence of significant acute traumatic injury to the skull, brain or cervical spine. 2. The appearance of the brain is normal. 3. Postoperative changes of ACDF from C4-C7, as above. Electronically Signed   By: Trudie Reedaniel  Entrikin M.D.   On: 11/12/2022 05:59   CT CERVICAL SPINE WO CONTRAST  Result Date: 11/12/2022 CLINICAL DATA:  61 year old female with history of trauma from a fall. EXAM: CT HEAD WITHOUT CONTRAST CT CERVICAL SPINE WITHOUT CONTRAST TECHNIQUE: Multidetector CT imaging of the head and cervical spine was performed following the standard protocol without intravenous contrast. Multiplanar CT image reconstructions of the cervical spine were also generated. RADIATION DOSE REDUCTION: This exam was performed according to the departmental dose-optimization program which includes automated exposure control, adjustment of the mA and/or  kV according to patient size and/or use of iterative reconstruction technique. COMPARISON:  CT of the head and cervical spine 04/01/2022. FINDINGS: CT HEAD FINDINGS Brain: No evidence of acute infarction, hemorrhage, hydrocephalus, extra-axial collection or mass lesion/mass effect. Vascular: No hyperdense vessel or unexpected calcification. Skull: Normal. Negative for fracture or focal lesion. Sinuses/Orbits: No acute finding. Other: None. CT CERVICAL SPINE FINDINGS Alignment: Normal. Skull base and vertebrae: Postoperative changes of ACDF from C4-C7 are noted with interbody grafts at C4-C5, C5-C6 and C6-C7. No acute fracture. No primary bone lesion or focal pathologic process. Soft tissues and spinal canal: No prevertebral fluid or swelling. No visible canal hematoma. Disc levels: Postoperative changes of ACDF, as above. Multilevel degenerative disc disease, most apparent at C7-T1. Moderate multilevel facet arthropathy bilaterally. Upper  chest: Unremarkable. Other: None. IMPRESSION: 1. No evidence of significant acute traumatic injury to the skull, brain or cervical spine. 2. The appearance of the brain is normal. 3. Postoperative changes of ACDF from C4-C7, as above. Electronically Signed   By: Trudie Reed M.D.   On: 11/12/2022 05:59   CT Tibia Fibula Right Wo Contrast  Result Date: 11/12/2022 CLINICAL DATA:  Leg trauma EXAM: CT OF THE LOWER RIGHT EXTREMITY WITHOUT CONTRAST TECHNIQUE: Multidetector CT imaging of the right lower extremity was performed according to the standard protocol. RADIATION DOSE REDUCTION: This exam was performed according to the departmental dose-optimization program which includes automated exposure control, adjustment of the mA and/or kV according to patient size and/or use of iterative reconstruction technique. COMPARISON:  None Available. FINDINGS: Bones/Joint/Cartilage Transverse fractures through the upper shaft of the tibia and fibula with medial displacement by over 100%. Greater comminution at the fibular fracture. No evidence of underlying bone lesion. Generalized osteopenia. Ligaments Suboptimally assessed by CT. Muscles and Tendons Expected regional soft tissue swelling. No major musculotendinous disruption seen. Soft tissues Expected soft tissue swelling around the fracture. There is a bubble of gas in the shin anterior to the tibial fracture which could reflect open injury, which should be readily apparent on exam. IMPRESSION: Displaced tibia and fibula shaft fractures.  No focal bone lesion. Electronically Signed   By: Tiburcio Pea M.D.   On: 11/12/2022 05:57   DG Chest Port 1 View  Result Date: 11/12/2022 CLINICAL DATA:  Leg fracture EXAM: PORTABLE CHEST 1 VIEW COMPARISON:  04/01/2022 FINDINGS: Stable heart size and mediastinal contours. Mildly low lung volumes with interstitial crowding. There is no edema, consolidation, effusion, or pneumothorax. ACDF hardware. IMPRESSION: Stable exam.  No  acute finding. Electronically Signed   By: Tiburcio Pea M.D.   On: 11/12/2022 05:21   DG Femur Portable Min 2 Views Right  Result Date: 11/12/2022 CLINICAL DATA:  Trauma.  Pain after fall EXAM: RIGHT FEMUR PORTABLE 2 VIEW COMPARISON:  None Available. FINDINGS: Known fractures at the level of the leg. No femur fracture or dislocation. Degenerative spurring at the medial knee. IMPRESSION: Negative for femur fracture. Electronically Signed   By: Tiburcio Pea M.D.   On: 11/12/2022 05:21   DG Tibia/Fibula Right Port  Result Date: 11/12/2022 CLINICAL DATA:  Trauma with fall. EXAM: PORTABLE RIGHT TIBIA AND FIBULA - 2 VIEW COMPARISON:  None Available. FINDINGS: Transverse medially displaced fractures through the tibial and fibular shafts. Generalized osteopenia without visible focal bone lesion. Expected soft tissue swelling. No knee joint effusion. IMPRESSION: Displaced tibial and fibular shaft fractures. Electronically Signed   By: Tiburcio Pea M.D.   On: 11/12/2022 05:20    Labs: Recent Labs  11/12/22 0418  WBC 13.3*  RBC 3.83*  HCT 34.2*  PLT 354   Recent Labs    11/12/22 0418  NA 139  K 3.8  CL 104  CO2 24  BUN 15  CREATININE 1.87*  GLUCOSE 116*  CALCIUM 8.9   Recent Labs    11/12/22 0437  INR 1.0    Review of Systems: ROS as detailed in HPI  Physical Exam: There is no height or weight on file to calculate BMI.  Physical Exam  Gen: AAOx3, NAD Comfortable at rest  Right Lower Extremity: Skin intact TTP over proximal third leg Compartments soft, compressible No pain with passive stretch ADF/APF/EHL 5/5 SILT throughout DP, PT 2+ to palp CR < 2s  Assessment and Plan: Ortho consult for closed right comminuted displaced tibia and fibula proximal shaft fractures sustained on 11/12/22  -history, exam and imaging reviewed at length with patient -npo since 9 pm 12/1 -plan for OR today 12/2 9 AM -long leg splint, NWB RLE -CT scans completed -preop orders  placed -postop PT/OT (anticipate WBAT with walker and 1-2 person assist postop)  Netta Cedars, MD Orthopaedic Surgeon [facility] [phone]  The risks and benefits were presented and reviewed. The risks due to hardware failure/irritation, new/persistent infection, stiffness, nerve/vessel/tendon injury, nonunion/malunion, wound healing issues, allograft usage, development of arthritis, failure of this surgery, possibility of external fixation with delayed definitive surgery, need for further surgery, thromboembolic events, anesthesia/medical complications, amputation, death among others were discussed. The patient acknowledged the explanation, agreed to proceed with the plan and a consent was signed.

## 2022-11-13 ENCOUNTER — Other Ambulatory Visit: Payer: Self-pay

## 2022-11-13 DIAGNOSIS — G894 Chronic pain syndrome: Secondary | ICD-10-CM

## 2022-11-13 DIAGNOSIS — S82401A Unspecified fracture of shaft of right fibula, initial encounter for closed fracture: Secondary | ICD-10-CM

## 2022-11-13 DIAGNOSIS — N179 Acute kidney failure, unspecified: Secondary | ICD-10-CM

## 2022-11-13 DIAGNOSIS — S82201A Unspecified fracture of shaft of right tibia, initial encounter for closed fracture: Secondary | ICD-10-CM

## 2022-11-13 LAB — CBC
HCT: 34 % — ABNORMAL LOW (ref 36.0–46.0)
Hemoglobin: 11.1 g/dL — ABNORMAL LOW (ref 12.0–15.0)
MCH: 28.5 pg (ref 26.0–34.0)
MCHC: 32.6 g/dL (ref 30.0–36.0)
MCV: 87.4 fL (ref 80.0–100.0)
Platelets: 367 10*3/uL (ref 150–400)
RBC: 3.89 MIL/uL (ref 3.87–5.11)
RDW: 13.1 % (ref 11.5–15.5)
WBC: 11.8 10*3/uL — ABNORMAL HIGH (ref 4.0–10.5)
nRBC: 0 % (ref 0.0–0.2)

## 2022-11-13 LAB — BASIC METABOLIC PANEL
Anion gap: 8 (ref 5–15)
BUN: 12 mg/dL (ref 6–20)
CO2: 25 mmol/L (ref 22–32)
Calcium: 8.7 mg/dL — ABNORMAL LOW (ref 8.9–10.3)
Chloride: 105 mmol/L (ref 98–111)
Creatinine, Ser: 1.14 mg/dL — ABNORMAL HIGH (ref 0.44–1.00)
GFR, Estimated: 55 mL/min — ABNORMAL LOW (ref >60)
Glucose, Bld: 149 mg/dL — ABNORMAL HIGH (ref 70–99)
Potassium: 4.2 mmol/L (ref 3.5–5.1)
Sodium: 138 mmol/L (ref 135–145)

## 2022-11-13 LAB — VITAMIN D 25 HYDROXY (VIT D DEFICIENCY, FRACTURES): Vit D, 25-Hydroxy: 10.64 ng/mL — ABNORMAL LOW (ref 30–100)

## 2022-11-13 LAB — GLUCOSE, CAPILLARY: Glucose-Capillary: 113 mg/dL — ABNORMAL HIGH (ref 70–99)

## 2022-11-13 MED ORDER — OXYCODONE-ACETAMINOPHEN 5-325 MG PO TABS
2.0000 | ORAL_TABLET | ORAL | Status: DC | PRN
  Administered 2022-11-13 – 2022-11-14 (×4): 2 via ORAL
  Filled 2022-11-13 (×4): qty 2

## 2022-11-13 MED ORDER — NALOXONE HCL 0.4 MG/ML IJ SOLN: 0.4000 mg | INTRAMUSCULAR | Status: DC | PRN

## 2022-11-13 MED ORDER — VITAMIN D (ERGOCALCIFEROL) 1.25 MG (50000 UNIT) PO CAPS
50000.0000 [IU] | ORAL_CAPSULE | ORAL | Status: DC
  Administered 2022-11-13: 50000 [IU] via ORAL
  Filled 2022-11-13: qty 1

## 2022-11-13 MED ORDER — SENNA 8.6 MG PO TABS
1.0000 | ORAL_TABLET | Freq: Every day | ORAL | Status: DC
  Administered 2022-11-13 – 2022-11-15 (×2): 8.6 mg via ORAL
  Filled 2022-11-13 (×3): qty 1

## 2022-11-13 NOTE — Progress Notes (Signed)
   Subjective: 1 Day Post-Op Procedure(s) (LRB): INTRAMEDULLARY (IM) NAIL TIBIAL (Right)  Pt c/o moderate pain to right lower leg this morning No issues overnight C/o a burning pain to her knee Remains non weight bearing right lower extremity Patient reports pain as moderate.  Objective:   VITALS:   Vitals:   11/13/22 0815 11/13/22 0838  BP: 128/68   Pulse:  (!) 101  Resp:    Temp:    SpO2:  100%    Right lower leg: dressing and cam boot intact Nv intact distally No rashes or signs of infection Guarded rom with log roll  LABS Recent Labs    11/12/22 0418 11/13/22 0158  HGB 11.2* 11.1*  HCT 34.2* 34.0*  WBC 13.3* 11.8*  PLT 354 367    Recent Labs    11/12/22 0418 11/13/22 0158  NA 139 138  K 3.8 4.2  BUN 15 12  CREATININE 1.87* 1.14*  GLUCOSE 116* 149*     Assessment/Plan: 1 Day Post-Op Procedure(s) (LRB): INTRAMEDULLARY (IM) NAIL TIBIAL (Right) Continue CAM boot at all times Non weight bearing to right lower extremity with walker Pain management Dvt prophylaxis PT/OT     Alphonsa Overall PA-C, MPAS [facility] is now [facility]  [place] [address], [address], [place], [place] [address] Phone: [phone] [email or web address] Facebook  [employer]

## 2022-11-13 NOTE — Progress Notes (Signed)
HD#1 Subjective:   Summary: Deanna Yang is a 61 year old female living with hypertension, hyperlipidemia, COPD, depression, OSA, schizophrenia who presents to the emergency room after a fall, admitted for right tib-fib fracture   Overnight Events: None   Sitting in recliner comfortably, does endorse some pain. Has chronic pain that she sees a pain specialist for. She notes frequent falls over the last 4 years where her legs "give out." Denies this being related to her multiple spinal surgeries Denies loss of bowel or bladder function.   Objective:  Vital signs in last 24 hours: Vitals:   11/12/22 1948 11/13/22 0515 11/13/22 0815 11/13/22 0838  BP: (!) 116/91 104/81 128/68   Pulse: 95 (!) 35  (!) 101  Resp: 20 17    Temp:      TempSrc:      SpO2: 95% 96%  100%  Weight:      Height:       Supplemental O2: None, room air  Physical Exam:  Constitutional: no acute distress HENT: normocephalic atraumatic Neck: supple Pulmonary/Chest: normal work of breathing on room air MSK: normal bulk and tone. Right lower extremity with brace and ace bandage in place Neurological: alert & oriented x 3 Skin: warm and dry Psych: normal mood  Filed Weights   11/12/22 0933  Weight: 86.2 kg     Intake/Output Summary (Last 24 hours) at 11/13/2022 1410 Last data filed at 11/13/2022 0900 Gross per 24 hour  Intake 1298.71 ml  Output --  Net 1298.71 ml   Net IO Since Admission: 2,898.71 mL [11/13/22 1410]  Pertinent Labs:    Latest Ref Rng & Units 11/13/2022    1:58 AM 11/12/2022    4:18 AM 04/01/2022   10:33 AM  CBC  WBC 4.0 - 10.5 K/uL 11.8  13.3  10.1   Hemoglobin 12.0 - 15.0 g/dL 67.3  41.9  37.9   Hematocrit 36.0 - 46.0 % 34.0  34.2  39.9   Platelets 150 - 400 K/uL 367  354  333        Latest Ref Rng & Units 11/13/2022    1:58 AM 11/12/2022    4:18 AM 04/01/2022   10:33 AM  CMP  Glucose 70 - 99 mg/dL 024  097  99   BUN 6 - 20 mg/dL 12  15  20    Creatinine 0.44 - 1.00  mg/dL  3.53  2.99   Sodium 135 - 145 mmol/L 138  139  139   Potassium 3.5 - 5.1 mmol/L 4.2  3.8  4.1   Chloride 98 - 111 mmol/L 105  104  106   CO2 22 - 32 mmol/L 25  24  24    Calcium 8.9 - 10.3 mg/dL 8.7  8.9  8.7   Total Protein 6.5 - 8.1 g/dL  5.8  7.1   Total Bilirubin 0.3 - 1.2 mg/dL  0.7  0.7   Alkaline Phos 38 - 126 U/L  69  101   AST 15 - 41 U/L  17  35   ALT 0 - 44 U/L  23  44     Imaging: No results found.  Assessment/Plan:   Principal Problem:   Traumatic closed displaced fracture of right tibial plafond with fibula Active Problems:   Essential (primary) hypertension   COPD (chronic obstructive pulmonary disease) (HCC)   AKI (acute kidney injury) (HCC)   Depression   Chronic pain syndrome   Closed fracture of right fibula and tibia  Patient Summary: Deanna Yang is a 61 year old female living with hypertension, hyperlipidemia, COPD, depression, OSA, schizophrenia who presents to the emergency room after a fall, admitted for right tib-fib fracture po day 1 intramedullary nailing  Traumatic displaced fracture of right tib-fib PO day 1 intramedullary nailing. Doing well and still having some pain. Recommending non-weight bearing and will continue to work with PT/OT. During admission will work to manage pain post op and plan for discharge home with home health in coming days. 25-hydroxy levels low, will start D2 weekly. Restart home pain regimen of morphine 30 mg BID, percocet 10-325 q6h, and tizanidine 8 mg TID.  -Appreciate orthopedic assistance -Pain control as per above. Naloxone ordered. -Bowel regimen -start D2 weekly -leukocytosis likely reactive, continue to monitor   Frequent falls Patient notes these occur with episodes of losing sensation in her legs and falling. She states she has seen many physicians for this but has not been given a diagnosis. Denies loss of bowel or bladder function or this being from her multiple spinal surgeries. Will obtain  orthostatics when able. With mulitple centrally acting medications, likely contributing. Will check B12.   -Follow up orthostatic after surgery -PT/OT after surgery -B12 -Follow up PCP and pain management about limiting centrally acting medications   AKI on CKD Seems that her baseline creatinine is about 1.2. At baseline.  -trend BMP   Hypertension Continue amloidpine 10 mg daily   COPD Resume Trelegy and DuoNebs as needed   Chronic pain syndrome Restart home regimen of tizanidine, morphine, and percocet.   Depression Schizophrenia Resume home meds of buspar, atarax, and seroquel  Diet: Normal VTE: Heparin Code: Full PT/OT recs: Home Health, BSC/3in1  Dispo: Anticipated discharge to Home in 1-2 days  Thalia Bloodgood DO Internal Medicine Resident PGY-3 Please contact the on call pager after 5 pm and on weekends at [phone].

## 2022-11-13 NOTE — Plan of Care (Signed)

## 2022-11-13 NOTE — Plan of Care (Signed)
  Problem: Education: Goal: Knowledge of General Education information will improve Description: Including pain rating scale, medication(s)/side effects and non-pharmacologic comfort measures Outcome: Not Progressing   Problem: Health Behavior/Discharge Planning: Goal: Ability to manage health-related needs will improve Outcome: Not Progressing   Problem: Clinical Measurements: Goal: Ability to maintain clinical measurements within normal limits will improve Outcome: Not Progressing Goal: Will remain free from infection Outcome: Not Progressing Goal: Diagnostic test results will improve Outcome: Not Progressing Goal: Respiratory complications will improve Outcome: Not Progressing Goal: Cardiovascular complication will be avoided Outcome: Not Progressing   Problem: Activity: Goal: Risk for activity intolerance will decrease Outcome: Not Progressing   Problem: Nutrition: Goal: Adequate nutrition will be maintained Outcome: Not Progressing   Problem: Coping: Goal: Level of anxiety will decrease Outcome: Not Progressing   Problem: Pain Managment: Goal: General experience of comfort will improve Outcome: Not Progressing

## 2022-11-13 NOTE — Evaluation (Signed)
Physical Therapy Evaluation Patient Details Name: Deanna Yang MRN: [id number] DOB: [birth year] Today's Date: 11/13/2022  History of Present Illness  61 yo female admitted 12/2 after fall with Rt tib/fib fx s/p ORIF with IM nail. PMhx: HTN, HLD, COPD on 3L when supine, depression, OSA, schizophrenia, and chronic pain.  Clinical Impression  Pt reports constant battle with pain at home, frequent falls and assist of parents at D/C. PT with power chair which she reports can fit in the house but states issues with trying to get it out of the building. Pt able to stand, step and transfer with min assist but HR 130 with stepping and denied more than a few steps at EOB. Pt will need to be able to progress mobility for safe return home with pt stating ST-SNF is not an option. Encouraged daily mobility with nursing, OOB to chair for meals and BSC for toileting. Pt with decreased strength, gait, transfers and function who will benefit from acute therapy to maximize mobility and safety.   SPO2 100% on RA HR 101-130 BP 128/68        Recommendations for follow up therapy are one component of a multi-disciplinary discharge planning process, led by the attending physician.  Recommendations may be updated based on patient status, additional functional criteria and insurance authorization.  Follow Up Recommendations Home health PT      Assistance Recommended at Discharge Frequent or constant Supervision/Assistance  Patient can return home with the following  A little help with walking and/or transfers;Assistance with cooking/housework;Assist for transportation;A little help with bathing/dressing/bathroom    Equipment Recommendations BSC/3in1  Recommendations for Other Services  OT consult    Functional Status Assessment Patient has had a recent decline in their functional status and demonstrates the ability to make significant improvements in function in a reasonable and predictable amount of time.      Precautions / Restrictions Precautions Precautions: Fall;Other (comment) Precaution Comments: watch HR Required Braces or Orthoses: Other Brace Other Brace: CAM boot Restrictions Weight Bearing Restrictions: Yes RLE Weight Bearing: Weight bearing as tolerated      Mobility  Bed Mobility               General bed mobility comments: EOB on arrival    Transfers Overall transfer level: Needs assistance   Transfers: Sit to/from Stand Sit to Stand: Min assist           General transfer comment: min assist with cues for hand placement to rise from surface. pt able to step 2' forward but denied further mobility due to pain with turn to chair. Cues for backing fully to surface, sequence for stepping with RW present and position in RW    Ambulation/Gait               General Gait Details: unable due to pain  Stairs            Wheelchair Mobility    Modified Rankin (Stroke Patients Only)       Balance Overall balance assessment: History of Falls                                           Pertinent Vitals/Pain Pain Assessment Pain Assessment: 0-10 Pain Score: 9  Pain Location: Rt LE Pain Descriptors / Indicators: Aching Pain Intervention(s): Limited activity within patient's tolerance, Monitored during session, Repositioned, RN gave pain meds  during session    Home Living Family/patient expects to be discharged to:: Private residence Living Arrangements: Parent (lives in attached mother in law suite to parents) Available Help at Discharge: Family;Available 24 hours/day Type of Home: House Home Access: Ramped entrance       Home Layout: One level Home Equipment: Agricultural consultant (2 wheels);Wheelchair - power;Cane - single point Additional Comments: pt reports at least 10 falls in the last year    Prior Function Prior Level of Function : Independent/Modified Independent;Driving             Mobility Comments: pt  reports being independent despite mutiple falls       Hand Dominance        Extremity/Trunk Assessment   Upper Extremity Assessment Upper Extremity Assessment: Overall WFL for tasks assessed    Lower Extremity Assessment Lower Extremity Assessment: RLE deficits/detail RLE Deficits / Details: grossly 3/5 strength limited by pain, ROM WFL. Unable to tolerate continued stance due to pain    Cervical / Trunk Assessment Cervical / Trunk Assessment: Normal  Communication   Communication: No difficulties  Cognition Arousal/Alertness: Awake/alert Behavior During Therapy: Flat affect Overall Cognitive Status: Within Functional Limits for tasks assessed                                 General Comments: pt self-limiting at times and reports 9/10 pain throughout session despite speaking clearly        General Comments      Exercises General Exercises - Lower Extremity Long Arc Quad: AROM, Right, 10 reps, Seated   Assessment/Plan    PT Assessment Patient needs continued PT services  PT Problem List Decreased strength;Decreased activity tolerance;Decreased mobility;Decreased knowledge of use of DME;Pain       PT Treatment Interventions DME instruction;Therapeutic activities;Balance training;Gait training;Functional mobility training;Therapeutic exercise;Patient/family education    PT Goals (Current goals can be found in the Care Plan section)  Acute Rehab PT Goals Patient Stated Goal: return home PT Goal Formulation: With patient Time For Goal Achievement: 11/27/22 Potential to Achieve Goals: Good    Frequency Min 5X/week     Co-evaluation               AM-PAC PT "6 Clicks" Mobility  Outcome Measure Help needed turning from your back to your side while in a flat bed without using bedrails?: A Little Help needed moving from lying on your back to sitting on the side of a flat bed without using bedrails?: A Little Help needed moving to and from a  bed to a chair (including a wheelchair)?: A Little Help needed standing up from a chair using your arms (e.g., wheelchair or bedside chair)?: A Little Help needed to walk in hospital room?: Total Help needed climbing 3-5 steps with a railing? : Total 6 Click Score: 14    End of Session Equipment Utilized During Treatment: Gait belt;Other (comment) (CAM boot) Activity Tolerance: Patient limited by pain Patient left: in chair;with call bell/phone within reach;with chair alarm set Nurse Communication: Mobility status;Weight bearing status PT Visit Diagnosis: Other abnormalities of gait and mobility (R26.89);Repeated falls (R29.6);Pain Pain - Right/Left: Right Pain - part of body: Leg    Time: 3825-0539 PT Time Calculation (min) (ACUTE ONLY): 25 min   Charges:   PT Evaluation $PT Eval Moderate Complexity: 1 Mod PT Treatments $Therapeutic Activity: 8-22 mins        Alejos Reinhardt P, PT Acute  Rehabilitation Services Office: [phone]   Cristine Polio 11/13/2022, 8:47 AM

## 2022-11-13 NOTE — Anesthesia Postprocedure Evaluation (Signed)
Anesthesia Post Note  Patient: Deanna Yang  Procedure(s) Performed: INTRAMEDULLARY (IM) NAIL TIBIAL (Right)     Patient location during evaluation: PACU Anesthesia Type: General Level of consciousness: awake and alert Pain management: pain level controlled Vital Signs Assessment: post-procedure vital signs reviewed and stable Respiratory status: spontaneous breathing, nonlabored ventilation, respiratory function stable and patient connected to nasal cannula oxygen Cardiovascular status: blood pressure returned to baseline and stable Postop Assessment: no apparent nausea or vomiting Anesthetic complications: no   No notable events documented.  Last Vitals:  Vitals:   11/12/22 1601 11/12/22 1948  BP: 124/69 (!) 116/91  Pulse: 91 95  Resp: 18 20  Temp: 36.7 C   SpO2: 95% 95%               Beryle Lathe

## 2022-11-13 NOTE — Evaluation (Signed)
Occupational Therapy Evaluation Patient Details Name: Deanna Yang MRN: [id number] DOB: [birth year] Today's Date: 11/13/2022   History of Present Illness 61 yo female admitted 12/2 after fall with Rt tib/fib fx s/p ORIF with IM nail. PMhx: HTN, HLD, COPD on 3L when supine, depression, OSA, schizophrenia, and chronic pain.   Clinical Impression   This 61 yo female admitted and underwent above presents to acute OT with PLOF of being mod I to independent with all basic ADLs and some ADLs in her house. Currently she is setup/S-Mod A for basic ADLs at bed level. She will continue to benefit from acute OT without need for follow up therapy but will need A prn.      Recommendations for follow up therapy are one component of a multi-disciplinary discharge planning process, led by the attending physician.  Recommendations may be updated based on patient status, additional functional criteria and insurance authorization.   Follow Up Recommendations  No OT follow up     Assistance Recommended at Discharge PRN  Patient can return home with the following A little help with walking and/or transfers;A little help with bathing/dressing/bathroom;Help with stairs or ramp for entrance;Assist for transportation;Assistance with cooking/housework    Functional Status Assessment  Patient has had a recent decline in their functional status and demonstrates the ability to make significant improvements in function in a reasonable and predictable amount of time.  Equipment Recommendations  Tub/shower seat;Wheelchair (measurements OT);Wheelchair cushion (measurements OT) (elevating leg rests for WC)       Precautions / Restrictions Precautions Precautions: Fall Precaution Comments: watch HR Required Braces or Orthoses: Other Brace Other Brace: CAM boot Restrictions Weight Bearing Restrictions: Yes RLE Weight Bearing: Weight bearing as tolerated Other Position/Activity Restrictions: per orders and op note  pt is WBAT as tolerated, per ortho PAC note 12/3 pt is NWB'ing in Cam boot             ADL either performed or assessed with clinical judgement   ADL Overall ADL's : Needs assistance/impaired Eating/Feeding: Independent;Bed level   Grooming: Set up;Bed level   Upper Body Bathing: Set up;Bed level   Lower Body Bathing: Moderate assistance;Bed level   Upper Body Dressing : Set up;Bed level   Lower Body Dressing: Moderate assistance;Bed level     Toilet Transfer Details (indicate cue type and reason): reported she had just gotten back to bed and recently gotten pain meds so did not want to get back up as of right now                 Vision Patient Visual Report: No change from baseline              Pertinent Vitals/Pain Pain Assessment Pain Assessment: 0-10 Pain Score: 7  Pain Location: Rt LE Pain Descriptors / Indicators: Aching, Sore Pain Intervention(s): Limited activity within patient's tolerance, Monitored during session     Hand Dominance Right   Extremity/Trunk Assessment Upper Extremity Assessment Upper Extremity Assessment: Overall WFL for tasks assessed           Communication Communication Communication: No difficulties   Cognition Arousal/Alertness: Awake/alert Behavior During Therapy: WFL for tasks assessed/performed Overall Cognitive Status: Within Functional Limits for tasks assessed  Home Living Family/patient expects to be discharged to:: Private residence Living Arrangements: Parent (lives in de-attached home from parents says it was her son's (party house) when he was younger so he would not go out and get drunk and drive.) Available Help at Discharge: Family;Available 24 hours/day Type of Home: House Home Access: Ramped entrance     Home Layout: One level     Bathroom Shower/Tub: Tub only (family in process of putting in shower in the tub and a shower  curtain)   Bathroom Toilet: Standard     Home Equipment: Agricultural consultant (2 wheels);Wheelchair - power;Cane - single point;Toilet riser   Additional Comments: pt reports at least 10 falls in the last year      Prior Functioning/Environment Prior Level of Function : Independent/Modified Independent;Driving             Mobility Comments: pt reports being independent despite mutiple falls          OT Problem List: Decreased strength;Decreased range of motion;Decreased activity tolerance;Impaired balance (sitting and/or standing);Pain      OT Treatment/Interventions: Self-care/ADL training;DME and/or AE instruction;Visual/perceptual remediation/compensation;Patient/family education;Balance training    OT Goals(Current goals can be found in the care plan section) Acute Rehab OT Goals Patient Stated Goal: to go home to see dog OT Goal Formulation: With patient Time For Goal Achievement: 11/27/22 Potential to Achieve Goals: Good  OT Frequency: Min 2X/week       AM-PAC OT "6 Clicks" Daily Activity     Outcome Measure Help from another person eating meals?: None Help from another person taking care of personal grooming?: A Little Help from another person toileting, which includes using toliet, bedpan, or urinal?: Total Help from another person bathing (including washing, rinsing, drying)?: A Lot Help from another person to put on and taking off regular upper body clothing?: A Little Help from another person to put on and taking off regular lower body clothing?: A Lot 6 Click Score: 15   End of Session    Activity Tolerance: Patient limited by pain Patient left: in bed;with call bell/phone within reach  OT Visit Diagnosis: Other abnormalities of gait and mobility (R26.89);Muscle weakness (generalized) (M62.81);Pain Pain - Right/Left: Right Pain - part of body: Leg                Time: 1007-1219 OT Time Calculation (min): 18 min Charges:  OT General Charges $OT Visit: 1  Visit OT Evaluation $OT Eval Moderate Complexity: 1 Mod Ignacia Palma, OTR/L Acute [employer] [facility] [phone] Office [phone]    Evette Georges 11/13/2022, 2:56 PM

## 2022-11-14 DIAGNOSIS — S82871A Displaced pilon fracture of right tibia, initial encounter for closed fracture: Secondary | ICD-10-CM

## 2022-11-14 DIAGNOSIS — S82831A Other fracture of upper and lower end of right fibula, initial encounter for closed fracture: Secondary | ICD-10-CM

## 2022-11-14 LAB — BASIC METABOLIC PANEL
Anion gap: 10 (ref 5–15)
Anion gap: 5 (ref 5–15)
BUN: 14 mg/dL (ref 6–20)
BUN: 13 mg/dL (ref 6–20)
CO2: 25 mmol/L (ref 22–32)
CO2: 27 mmol/L (ref 22–32)
Calcium: 8.5 mg/dL — ABNORMAL LOW (ref 8.9–10.3)
Calcium: 8.2 mg/dL — ABNORMAL LOW (ref 8.9–10.3)
Chloride: 102 mmol/L (ref 98–111)
Chloride: 102 mmol/L (ref 98–111)
Creatinine, Ser: 1.08 mg/dL — ABNORMAL HIGH (ref 0.44–1.00)
Creatinine, Ser: 1.21 mg/dL — ABNORMAL HIGH (ref 0.44–1.00)
GFR, Estimated: 59 mL/min — ABNORMAL LOW (ref >60)
GFR, Estimated: 51 mL/min — ABNORMAL LOW (ref >60)
Glucose, Bld: 116 mg/dL — ABNORMAL HIGH (ref 70–99)
Glucose, Bld: 93 mg/dL (ref 70–99)
Potassium: 3.7 mmol/L (ref 3.5–5.1)
Potassium: 3.4 mmol/L — ABNORMAL LOW (ref 3.5–5.1)
Sodium: 137 mmol/L (ref 135–145)
Sodium: 134 mmol/L — ABNORMAL LOW (ref 135–145)

## 2022-11-14 LAB — CBC
HCT: 34.4 % — ABNORMAL LOW (ref 36.0–46.0)
HCT: 29.3 % — ABNORMAL LOW (ref 36.0–46.0)
Hemoglobin: 11.1 g/dL — ABNORMAL LOW (ref 12.0–15.0)
Hemoglobin: 9.8 g/dL — ABNORMAL LOW (ref 12.0–15.0)
MCH: 28.8 pg (ref 26.0–34.0)
MCH: 29.2 pg (ref 26.0–34.0)
MCHC: 32.3 g/dL (ref 30.0–36.0)
MCHC: 33.4 g/dL (ref 30.0–36.0)
MCV: 89.4 fL (ref 80.0–100.0)
MCV: 87.2 fL (ref 80.0–100.0)
Platelets: 380 10*3/uL (ref 150–400)
Platelets: 332 10*3/uL (ref 150–400)
RBC: 3.85 MIL/uL — ABNORMAL LOW (ref 3.87–5.11)
RBC: 3.36 MIL/uL — ABNORMAL LOW (ref 3.87–5.11)
RDW: 13.2 % (ref 11.5–15.5)
RDW: 13.2 % (ref 11.5–15.5)
WBC: 11.3 10*3/uL — ABNORMAL HIGH (ref 4.0–10.5)
WBC: 11.3 10*3/uL — ABNORMAL HIGH (ref 4.0–10.5)
nRBC: 0 % (ref 0.0–0.2)
nRBC: 0 % (ref 0.0–0.2)

## 2022-11-14 LAB — GLUCOSE, CAPILLARY: Glucose-Capillary: 121 mg/dL — ABNORMAL HIGH (ref 70–99)

## 2022-11-14 LAB — VITAMIN B12: Vitamin B-12: 96 pg/mL — ABNORMAL LOW (ref 180–914)

## 2022-11-14 MED ORDER — OXYCODONE-ACETAMINOPHEN 5-325 MG PO TABS
2.0000 | ORAL_TABLET | Freq: Four times a day (QID) | ORAL | Status: DC | PRN
  Administered 2022-11-14 – 2022-11-15 (×2): 2 via ORAL
  Filled 2022-11-14 (×3): qty 2

## 2022-11-14 MED ORDER — LACTATED RINGERS IV BOLUS
500.0000 mL | Freq: Once | INTRAVENOUS | Status: AC
  Administered 2022-11-14: 500 mL via INTRAVENOUS

## 2022-11-14 MED ORDER — LACTATED RINGERS IV BOLUS
1000.0000 mL | Freq: Once | INTRAVENOUS | Status: AC
  Administered 2022-11-14: 1000 mL via INTRAVENOUS

## 2022-11-14 MED ORDER — LACTATED RINGERS IV BOLUS: 500.0000 mL | Freq: Once | INTRAVENOUS | Status: DC

## 2022-11-14 MED ORDER — TIZANIDINE HCL 4 MG PO TABS
8.0000 mg | ORAL_TABLET | Freq: Three times a day (TID) | ORAL | Status: DC | PRN
  Administered 2022-11-14: 8 mg via ORAL
  Filled 2022-11-14: qty 2

## 2022-11-14 MED ORDER — CYANOCOBALAMIN 1000 MCG/ML IJ SOLN
1000.0000 ug | Freq: Once | INTRAMUSCULAR | Status: AC
  Administered 2022-11-14: 1000 ug via INTRAMUSCULAR
  Filled 2022-11-14: qty 1

## 2022-11-14 NOTE — Progress Notes (Addendum)
Presented to bedside after nursing report drop in blood pressure and patient becoming somnolent but responds to voice. At bedside patient is resting in bed in no acute distress. Able to stay awake and have full conversation, she states she feels tired. At home she normally feels this way after taking her tizanidine. She takes it a few times a week in the evening. Rarely takes in the day time and if she does, she stays home and doesn't drive.   On exam her respirations are 16, heart rate of 85 bpm and blood pressure maps range from 60-65. Event likely secondary to polypharmacy, her home pain regimen was restarted. She presented with falls and likely her medication regimen causing some orthostasis leading to frequent falls. Will give 1L bolus of LR and recheck vitals. If BP normalizes will discharge home and discuss half of tizanidine dose. Recommend she follow up with pain management physician and check BP at home frequently if feeling lightheaded or dizzy.   Addendum 1712 - BP normalized.   Addendum 1834 - BP again decreased with maps 60-65 and HR 70's. Likely 2/2 tizanidine. She is alert and oriented, well appearing. Abd soft, non-tender, non-distended. Denies any increased abdominal pain or leg pain. Telemetry with NSR. Labs this morning with evidence of acute bleed. On repeat BP increased back to 108/80. With fluctuating blood pressures will plan to DC tomorrow and discontinue tizanidine.

## 2022-11-14 NOTE — Progress Notes (Signed)
HD#2 Subjective:   Summary: Deanna Yang is a 61 year old female living with hypertension, hyperlipidemia, COPD, depression, OSA, schizophrenia who presents to the emergency room after a fall, admitted for right tib-fib fracture   Overnight Events: None  Feeling well. Pain controlled. Wanting to go home.  Objective:  Vital signs in last 24 hours: Vitals:   11/14/22 1445 11/14/22 1518 11/14/22 1617 11/14/22 1759  BP:  (!) 78/52 120/81 (!) 80/53  Pulse:  62 65 74  Resp: 17 14 16    Temp: 97.6 F (36.4 C)     TempSrc:      SpO2: 91%     Weight:      Height:       Supplemental O2: None, room air  Physical Exam:  Constitutional: no acute distress HENT: normocephalic atraumatic Neck: supple Pulmonary/Chest: normal work of breathing on room air MSK: normal bulk and tone. Right lower extremity with brace and ace bandage in place Neurological: alert & oriented x 3 Skin: warm and dry Psych: normal mood  Filed Weights   11/12/22 0933  Weight: 86.2 kg     Intake/Output Summary (Last 24 hours) at 11/14/2022 1938 Last data filed at 11/14/2022 1826 Gross per 24 hour  Intake 620.32 ml  Output --  Net 620.32 ml    Net IO Since Admission: 3,879.03 mL [11/14/22 1938]  Pertinent Labs:    Latest Ref Rng & Units 11/14/2022    7:09 PM 11/14/2022    3:50 AM 11/13/2022    1:58 AM  CBC  WBC 4.0 - 10.5 K/uL 11.3  11.3  11.8   Hemoglobin 12.0 - 15.0 g/dL 9.8  11.1  11.1   Hematocrit 36.0 - 46.0 % 29.3  34.4  34.0   Platelets 150 - 400 K/uL 332  380  367        Latest Ref Rng & Units 11/14/2022    3:50 AM 11/13/2022    1:58 AM 11/12/2022    4:18 AM  CMP  Glucose 70 - 99 mg/dL 116  149  116   BUN 6 - 20 mg/dL 14  12  15    Creatinine 0.44 - 1.00 mg/dL 1.08  1.14  1.87   Sodium 135 - 145 mmol/L 137  138  139   Potassium 3.5 - 5.1 mmol/L 3.7  4.2  3.8   Chloride 98 - 111 mmol/L 102  105  104   CO2 22 - 32 mmol/L 25  25  24    Calcium 8.9 - 10.3 mg/dL 8.5  8.7  8.9   Total  Protein 6.5 - 8.1 g/dL   5.8   Total Bilirubin 0.3 - 1.2 mg/dL   0.7   Alkaline Phos 38 - 126 U/L   69   AST 15 - 41 U/L   17   ALT 0 - 44 U/L   23     Imaging: No results found.  Assessment/Plan:   Principal Problem:   Traumatic closed displaced fracture of right tibial plafond with fibula Active Problems:   Essential (primary) hypertension   COPD (chronic obstructive pulmonary disease) (HCC)   AKI (acute kidney injury) (HCC)   Depression   Chronic pain syndrome   Closed fracture of right fibula and tibia   Patient Summary: Deanna Yang is a 61 year old female living with hypertension, hyperlipidemia, COPD, depression, OSA, schizophrenia who presents to the emergency room after a fall, admitted for right tib-fib fracture po day 1 intramedullary nailing  Traumatic displaced fracture of right  tib-fib PO day 2 intramedullary nailing. Doing well and still having some pain. Recommending non-weight bearing and will continue to work with PT/OT.  Home pain regimen of morphine 30 mg BID, percocet 10-325 q6h, and tizanidine 8 mg TID was restarted. Patient was noted to have hypotension, responsive to fluids after receiving tizanidine. HR did not compensate appropriately as expected with tizanidine.  We will to monitor overnight and discharge home tomorrow if she remains stable. -Appreciate orthopedic assistance -ms contin 30mg  Q12 hr and percocet 5-325 X2 Q6hr PRN. Stop tizanidine -Bowel regimen   Frequent falls Patient reported that she often times falls asleep after taking her home tizanidine. Also takes 1800mg  gabapentin nightly. Suspect polypharmacy likely playing major role in her falls. She will need to discuss outpatient regimen adjustment with her pain management doctor.  She does have low b12 which we are repleting.  -Follow up PCP and pain management about limiting centrally acting medications   AKI on CKD - monitor.   Hypertension Continue amloidpine 10 mg daily    COPD Resume Trelegy and DuoNebs as needed   Chronic pain syndrome Continue morphine, and percocet.  - stop tizanidine  Depression Schizophrenia Continuehome meds of buspar, atarax, and seroquel  Diet: Normal VTE: Heparin Code: Full PT/OT recs: Home Health, BSC/3in1  Dispo: Anticipated discharge to Home in 1-2 days  , MD

## 2022-11-14 NOTE — Plan of Care (Signed)
  Problem: Education: Goal: Knowledge of General Education information will improve Description: Including pain rating scale, medication(s)/side effects and non-pharmacologic comfort measures Outcome: Adequate for Discharge   Problem: Health Behavior/Discharge Planning: Goal: Ability to manage health-related needs will improve Outcome: Adequate for Discharge   Problem: Clinical Measurements: Goal: Ability to maintain clinical measurements within normal limits will improve Outcome: Adequate for Discharge Goal: Will remain free from infection Outcome: Adequate for Discharge Goal: Diagnostic test results will improve Outcome: Adequate for Discharge Goal: Respiratory complications will improve Outcome: Adequate for Discharge Goal: Cardiovascular complication will be avoided Outcome: Adequate for Discharge   Problem: Activity: Goal: Risk for activity intolerance will decrease Outcome: Adequate for Discharge   Problem: Nutrition: Goal: Adequate nutrition will be maintained Outcome: Adequate for Discharge   Problem: Coping: Goal: Level of anxiety will decrease Outcome: Adequate for Discharge   Problem: Elimination: Goal: Will not experience complications related to bowel motility Outcome: Adequate for Discharge Goal: Will not experience complications related to urinary retention Outcome: Adequate for Discharge   Problem: Pain Managment: Goal: General experience of comfort will improve Outcome: Adequate for Discharge   Problem: Safety: Goal: Ability to remain free from injury will improve Outcome: Adequate for Discharge   Problem: Skin Integrity: Goal: Risk for impaired skin integrity will decrease Outcome: Adequate for Discharge   Problem: Acute Rehab PT Goals(only PT should resolve) Goal: Pt Will Go Supine/Side To Sit Outcome: Adequate for Discharge Goal: Patient Will Transfer Sit To/From Stand Outcome: Adequate for Discharge Goal: Pt Will Transfer Bed To Chair/Chair  To Bed Outcome: Adequate for Discharge Goal: Pt Will Ambulate Outcome: Adequate for Discharge Goal: Pt/caregiver will Perform Home Exercise Program Outcome: Adequate for Discharge   Problem: Acute Rehab OT Goals (only OT should resolve) Goal: Pt. Will Perform Upper Body Bathing Outcome: Adequate for Discharge Goal: Pt. Will Perform Lower Body Bathing Outcome: Adequate for Discharge Goal: Pt. Will Perform Upper Body Dressing Outcome: Adequate for Discharge Goal: Pt. Will Perform Lower Body Dressing Outcome: Adequate for Discharge Goal: Pt. Will Transfer To Toilet Outcome: Adequate for Discharge Goal: Pt. Will Perform Toileting-Clothing Manipulation Outcome: Adequate for Discharge Goal: OT Additional ADL Goal #1 Outcome: Adequate for Discharge

## 2022-11-14 NOTE — TOC Transition Note (Signed)
Transition of Care [facility]) - CM/SW Discharge Note   Patient Details  Name: Deanna Yang MRN: [id number] Date of Birth: May 08, 1961  Transition of [facility]) CM/SW Contact:  Bess Kinds, RN Phone Number: [phone] 11/14/2022, 2:05 PM   Clinical Narrative:     Spoke with patient to discuss post acute transition. Patient to transition home with her parents. Discussed recommended DME - referral to [employer] for wheelchair, BSC, and tub bench. Patient stated that she will get RW using her OTC benefit with her insurance. HH PT referral accepted by [facility]. Patient to arrange transportation home. No further TOC needs identified at this time.   Final next level of care: Home w Home Health Services Barriers to Discharge: No Barriers Identified   Patient Goals and CMS Choice Patient states their goals for this hospitalization and ongoing recovery are:: return home with parents CMS Medicare.gov Compare Post Acute Care list provided to:: Patient Choice offered to / list presented to : Patient  Discharge Placement                       Discharge Plan and Services                DME Arranged: Bedside commode, Wheelchair manual, Tub bench DME Agency: [employer] Date DME Agency Contacted: 11/14/22 Time DME Agency Contacted: 1404 Representative spoke with at DME Agency: Wilkie Aye HH Arranged: PT HH Agency: [facility] Chip Boer now known at [place]) Date HH Agency Contacted: 11/14/22 Time HH Agency Contacted: [phone] Representative spoke with at [facility] Agency: Angie  Social Determinants of Health (SDOH) Interventions Food Insecurity Interventions: Architect ([facility] only)   Readmission Risk Interventions     No data to display

## 2022-11-14 NOTE — Discharge Instructions (Signed)
Dear Mrs. Tumlin,  Thank you for trusting [place] with your care. We treated you in the hospital for a leg fracture. The surgeons repaired it.   We will supply you with a wheelchair, walker, and shower seat. The physical therapists recommended that you follow up as an outpatient to strengthen your leg. For the pain control, please continue taking your home pain regimen. If you require additional medicine, please call your pain doctor to discuss this. The orthopedic surgeon's office will call you to schedule an outpatient follow up. Whenever someone breaks a bone like this from falling, we recommend that they get a DEXA scan. Please as your primary care doctor about this test. We also recommend that you follow up with your primary care doctor in 1-2 weeks after discharge from the hospital. We also checked your B12 levels, since this can cause leg numbness. Your level was low so we gave you a supplemental injection here. We recommend taking b12 supplements.

## 2022-11-14 NOTE — Progress Notes (Cosign Needed Addendum)
    Durable Medical Equipment  (From admission, onward)           Start     Ordered   11/14/22 1353  For home use only DME 3 n 1  Once       Comments: Patient requires bedside commode d/t bathroom at home is at the other side of house from where she will. Patient is limited in her mobility at this time d/t tib/fib fracture.   11/14/22 1354   11/14/22 1351  For home use only DME Tub bench  Once        11/14/22 1351   11/14/22 1142  For home use only DME lightweight manual wheelchair with seat cushion  Once       Comments: Patient suffers from tibial-fibular fracture which impairs their ability to perform daily activities like bathing, dressing, and toileting in the home.  A walker will not resolve  issue with performing activities of daily living. A wheelchair will allow patient to safely perform daily activities. Patient is not able to propel themselves in the home using a standard weight wheelchair due to endurance. Patient can self propel in the lightweight wheelchair. Length of need 6 months . Accessories: elevating leg rests (ELRs), wheel locks, extensions and anti-tippers.   11/14/22 1142

## 2022-11-14 NOTE — Progress Notes (Signed)
Physical Therapy Treatment Patient Details Name: SUMNER BOESCH MRN: [id number] DOB: 08/15/1961 Today's Date: 11/14/2022   History of Present Illness 61 yo female admitted 12/2 after fall with Rt tib/fib fx s/p ORIF with IM nail. PMhx: HTN, HLD, COPD on 3L when supine, depression, OSA, schizophrenia, and chronic pain.    PT Comments    Patient making good progress with mobility and ambulated safe hop to pattern while keeping Rt LE off floor for NWB status. Distance limited by fatigue and pain and patient ambulated 2 bouts of ~10'. Pt reports pain mostly along superior incision above CAM boot. EOS pt able to scoot laterally alone EOB to reposition with bil UE use and Lt LE while keep Rt LE NWB. Discussed DME needs and pt has been set up with equipment by TOC. Recommend HHPT when pt is ready for discharge home. Will continue to progress as able.   Recommendations for follow up therapy are one component of a multi-disciplinary discharge planning process, led by the attending physician.  Recommendations may be updated based on patient status, additional functional criteria and insurance authorization.  Follow Up Recommendations  Home health PT     Assistance Recommended at Discharge Frequent or constant Supervision/Assistance  Patient can return home with the following A little help with walking and/or transfers;Assistance with cooking/housework;Assist for transportation;A little help with bathing/dressing/bathroom   Equipment Recommendations  BSC/3in1;Wheelchair (measurements PT);Wheelchair cushion (measurements PT);Rolling walker (2 wheels)    Recommendations for Other Services OT consult     Precautions / Restrictions Precautions Precautions: Fall Precaution Comments: watch HR Required Braces or Orthoses: Other Brace Other Brace: CAM boot Restrictions Weight Bearing Restrictions: Yes RLE Weight Bearing: Non weight bearing Other Position/Activity Restrictions: per orders and op  note pt is WBAT as tolerated, per ortho PA-C note 12/3 pt is NWB'ing in Cam boot     Mobility  Bed Mobility Overal bed mobility: Needs Assistance Bed Mobility: Supine to Sit, Sit to Supine     Supine to sit: Supervision Sit to supine: Supervision   General bed mobility comments: supervision with pt lifting Rt LE by using UEs to hold CAM boot. unable to do a SLR or partial SLR due to weakness.    Transfers Overall transfer level: Needs assistance Equipment used: Rolling walker (2 wheels) Transfers: Sit to/from Stand Sit to Stand: Min assist                Ambulation/Gait Ambulation/Gait assistance: Min assist Gait Distance (Feet): 10 Feet (2x10) Assistive device: Rolling walker (2 wheels) Gait Pattern/deviations: Step-to pattern, Decreased stride length, Decreased weight shift to right (NWB on Rt LE) Gait velocity: decr     General Gait Details: pt able to maintain Rt LE NWB with knee/hip flexion and performed hop to pattern with Lt LE and RW. Pt able to go short distance with assist to steady walker during turn. pt c/o pain in superior stitches on Rt knee incision.   Stairs             Wheelchair Mobility    Modified Rankin (Stroke Patients Only)       Balance Overall balance assessment: History of Falls                                          Cognition Arousal/Alertness: Awake/alert Behavior During Therapy: WFL for tasks assessed/performed Overall Cognitive Status: Within Functional Limits for  tasks assessed                                          Exercises      General Comments        Pertinent Vitals/Pain Pain Assessment Pain Assessment: Faces Faces Pain Scale: Hurts even more Pain Location: Rt LE Pain Descriptors / Indicators: Aching, Sore Pain Intervention(s): Limited activity within patient's tolerance, Monitored during session, Repositioned    Home Living                           Prior Function            PT Goals (current goals can now be found in the care plan section) Acute Rehab PT Goals PT Goal Formulation: With patient Time For Goal Achievement: 11/27/22 Potential to Achieve Goals: Good Progress towards PT goals: Progressing toward goals    Frequency    Min 5X/week      PT Plan Current plan remains appropriate    Co-evaluation              AM-PAC PT "6 Clicks" Mobility   Outcome Measure  Help needed turning from your back to your side while in a flat bed without using bedrails?: None Help needed moving from lying on your back to sitting on the side of a flat bed without using bedrails?: A Little Help needed moving to and from a bed to a chair (including a wheelchair)?: A Little Help needed standing up from a chair using your arms (e.g., wheelchair or bedside chair)?: A Little Help needed to walk in hospital room?: A Little Help needed climbing 3-5 steps with a railing? : Total 6 Click Score: 17    End of Session         PT Visit Diagnosis: Other abnormalities of gait and mobility (R26.89);Repeated falls (R29.6);Pain Pain - Right/Left: Right Pain - part of body: Leg     Time: 6144-3154 PT Time Calculation (min) (ACUTE ONLY): 14 min  Charges:  $Gait Training: 8-22 mins                     Wynn Maudlin, DPT Acute Rehabilitation Services Office [phone]  11/14/22 2:28 PM

## 2022-11-14 NOTE — Progress Notes (Signed)
Occupational Therapy Treatment Patient Details Name: Deanna Yang MRN: [id number] DOB: 04-Feb-1961 Today's Date: 11/14/2022   History of present illness 61 yo female admitted 12/2 after fall with Rt tib/fib fx s/p ORIF with IM nail. PMhx: HTN, HLD, COPD on 3L when supine, depression, OSA, schizophrenia, and chronic pain.   OT comments  This 61 yo female attempted to see today but reportedly she had just gotten back in bed again (as I had caught her yesterday). No wanting to get back up, but reporting she had been up to [facility] several times with staff and had hopped with PT earlier and done well (per their note min A with RW and able to maintain NWB'ing short distances). Pt reports she plans to mainly use the wheelchair she is getting and will only use RW for transfers and in places that the wheelchair will not fit. We will continue to follow.   Recommendations for follow up therapy are one component of a multi-disciplinary discharge planning process, led by the attending physician.  Recommendations may be updated based on patient status, additional functional criteria and insurance authorization.    Follow Up Recommendations  No OT follow up     Assistance Recommended at Discharge PRN  Patient can return home with the following  A little help with walking and/or transfers;A little help with bathing/dressing/bathroom;Help with stairs or ramp for entrance;Assist for transportation;Assistance with cooking/housework   Equipment Recommendations  Tub/shower bench;Wheelchair (measurements OT);Wheelchair cushion (measurements OT) (elevating leg rest)       Precautions / Restrictions Precautions Precautions: Fall Precaution Comments: watch HR Required Braces or Orthoses: Other Brace Other Brace: CAM boot Restrictions Weight Bearing Restrictions: Yes RLE Weight Bearing: Non weight bearing Other Position/Activity Restrictions: per orders and op note pt is WBAT as tolerated, per ortho PA-C note  12/3 pt is NWB'ing in Cam boot              ADL either performed or assessed with clinical judgement   ADL                                         General ADL Comments: Pt reports she has been getting up to [facility] with RW with S from staff and not putting weight on RLE. She reports her mom is getting her some new pants that she will part of the right leg off of so they will fit over CAM boot. She iis aware to put underwear and pants on RLE first when getting dressed  Pt plans to sponge bathe until cleared by surgeon that she can get RLE wet--when this time comes I have made pt aware that she needs to get into the tub via the tub bench with her CAM boot on, then remove once in tub ready to shower, replace once dried off before getting out of tub/shower---she verbalized understanding.    Extremity/Trunk Assessment Upper Extremity Assessment Upper Extremity Assessment: Overall WFL for tasks assessed            Vision Patient Visual Report: No change from baseline            Cognition Arousal/Alertness: Awake/alert Behavior During Therapy: WFL for tasks assessed/performed  Pertinent Vitals/ Pain       Pain Assessment Pain Assessment: 0-10 Pain Score: 4  Pain Location: RLE--pt reporting pain becoming more controlled/tolerable since starting back on her home regimine of pain meds         Frequency  Min 2X/week        Progress Toward Goals  OT Goals(current goals can now be found in the care plan section)  Progress towards OT goals: Progressing toward goals  Acute Rehab OT Goals Patient Stated Goal: to go home today OT Goal Formulation: With patient Time For Goal Achievement: 11/27/22 Potential to Achieve Goals: Good  Plan Discharge plan remains appropriate       AM-PAC OT "6 Clicks" Daily Activity     Outcome Measure   Help from another person eating meals?: None Help  from another person taking care of personal grooming?: A Little Help from another person toileting, which includes using toliet, bedpan, or urinal?: A Little Help from another person bathing (including washing, rinsing, drying)?: A Little Help from another person to put on and taking off regular upper body clothing?: A Little Help from another person to put on and taking off regular lower body clothing?: A Little 6 Click Score: 19    End of Session    OT Visit Diagnosis: Other abnormalities of gait and mobility (R26.89);Muscle weakness (generalized) (M62.81);Pain Pain - Right/Left: Right   Activity Tolerance Patient tolerated treatment well   Patient Left in bed;with call bell/phone within reach           Time: 1751-0258 OT Time Calculation (min): 11 min  Charges: OT General Charges $OT Visit: 1 Visit OT Treatments $Self Care/Home Management : 8-22 mins  Ignacia Palma, OTR/L Acute Rehab Services [facility] [phone] Office [phone]    Deanna Yang 11/14/2022, 11:35 PM

## 2022-11-15 ENCOUNTER — Encounter (HOSPITAL_COMMUNITY): Payer: Self-pay | Admitting: Orthopaedic Surgery

## 2022-11-15 ENCOUNTER — Encounter: Payer: Self-pay | Admitting: Student

## 2022-11-15 DIAGNOSIS — Z87891 Personal history of nicotine dependence: Secondary | ICD-10-CM

## 2022-11-15 DIAGNOSIS — G894 Chronic pain syndrome: Secondary | ICD-10-CM

## 2022-11-15 DIAGNOSIS — I10 Essential (primary) hypertension: Secondary | ICD-10-CM

## 2022-11-15 DIAGNOSIS — J449 Chronic obstructive pulmonary disease, unspecified: Secondary | ICD-10-CM

## 2022-11-15 LAB — CBC
HCT: 32.7 % — ABNORMAL LOW (ref 36.0–46.0)
Hemoglobin: 10.6 g/dL — ABNORMAL LOW (ref 12.0–15.0)
MCH: 28.6 pg (ref 26.0–34.0)
MCHC: 32.4 g/dL (ref 30.0–36.0)
MCV: 88.1 fL (ref 80.0–100.0)
Platelets: 367 10*3/uL (ref 150–400)
RBC: 3.71 MIL/uL — ABNORMAL LOW (ref 3.87–5.11)
RDW: 13.2 % (ref 11.5–15.5)
WBC: 9 10*3/uL (ref 4.0–10.5)
nRBC: 0 % (ref 0.0–0.2)

## 2022-11-15 LAB — GLUCOSE, CAPILLARY: Glucose-Capillary: 123 mg/dL — ABNORMAL HIGH (ref 70–99)

## 2022-11-15 MED ORDER — POTASSIUM CHLORIDE CRYS ER 20 MEQ PO TBCR
30.0000 meq | EXTENDED_RELEASE_TABLET | Freq: Once | ORAL | Status: AC
  Administered 2022-11-15: 30 meq via ORAL
  Filled 2022-11-15: qty 1

## 2022-11-15 NOTE — Progress Notes (Signed)
Mobility Specialist Progress Note   11/15/22 1513  Mobility  Activity Stood at bedside  Level of Assistance Modified independent, requires aide device or extra time  Assistive Device Front wheel walker  Distance Ambulated (ft) 2 ft  RLE Weight Bearing NWB  Activity Response Tolerated well  $Mobility charge 1 Mobility   Received pt at EOB prepped for d/c home but agreeable. Pt able to perform SLS on LLE for 1 min to exemplify weight bearing precautions. No faults during performed exercise, returned back to EOB w/o any further needs.   Frederico Hamman Mobility Specialist Please contact via SecureChat or  Rehab office at [phone]

## 2022-11-15 NOTE — Plan of Care (Signed)
Patient stable waiting on parents to pick her up. All equipment at bedside. No further questions. IV removed.   Problem: Education: Goal: Knowledge of General Education information will improve Description: Including pain rating scale, medication(s)/side effects and non-pharmacologic comfort measures 11/15/2022 1510 by Darleene Cleaver, RN Outcome: Adequate for Discharge 11/15/2022 0732 by Darleene Cleaver, RN Outcome: Progressing   Problem: Health Behavior/Discharge Planning: Goal: Ability to manage health-related needs will improve Outcome: Adequate for Discharge   Problem: Clinical Measurements: Goal: Ability to maintain clinical measurements within normal limits will improve Outcome: Adequate for Discharge Goal: Will remain free from infection Outcome: Adequate for Discharge Goal: Diagnostic test results will improve Outcome: Adequate for Discharge Goal: Respiratory complications will improve Outcome: Adequate for Discharge Goal: Cardiovascular complication will be avoided Outcome: Adequate for Discharge   Problem: Activity: Goal: Risk for activity intolerance will decrease 11/15/2022 1510 by Darleene Cleaver, RN Outcome: Adequate for Discharge 11/15/2022 0732 by Darleene Cleaver, RN Outcome: Progressing   Problem: Nutrition: Goal: Adequate nutrition will be maintained Outcome: Adequate for Discharge   Problem: Coping: Goal: Level of anxiety will decrease Outcome: Adequate for Discharge   Problem: Elimination: Goal: Will not experience complications related to bowel motility Outcome: Adequate for Discharge Goal: Will not experience complications related to urinary retention Outcome: Adequate for Discharge   Problem: Pain Managment: Goal: General experience of comfort will improve 11/15/2022 1510 by Darleene Cleaver, RN Outcome: Adequate for Discharge 11/15/2022 0732 by Darleene Cleaver, RN Outcome: Progressing   Problem: Safety: Goal: Ability to remain free from  injury will improve 11/15/2022 1510 by Darleene Cleaver, RN Outcome: Adequate for Discharge 11/15/2022 0732 by Darleene Cleaver, RN Outcome: Progressing   Problem: Skin Integrity: Goal: Risk for impaired skin integrity will decrease 11/15/2022 1510 by Darleene Cleaver, RN Outcome: Adequate for Discharge 11/15/2022 0732 by Darleene Cleaver, RN Outcome: Progressing

## 2022-11-15 NOTE — Discharge Summary (Signed)
Name: Deanna LimDolores A Yang MRN: [id number] DOB: 07/07/1961 61 y.o. PCP: Eartha InchBadger, Michael C, MD  Date of Admission: 11/12/2022  4:04 AM Date of Discharge: 11/15/22 Attending Physician: Dr. Cleda DaubE. Hoffman  Discharge Diagnosis: Principal Problem:   Traumatic closed displaced fracture of right tibial plafond with fibula Active Problems:   Essential (primary) hypertension   COPD (chronic obstructive pulmonary disease) (HCC)   AKI (acute kidney injury) (HCC)   Depression   Chronic pain syndrome   Closed fracture of right fibula and tibia    Discharge Medications: Allergies as of 11/15/2022       Reactions   Ambien [zolpidem Tartrate] Shortness Of Breath, Swelling   Tongue swelling, throat swelling.   Beta Adrenergic Blockers Anaphylaxis   Fish Allergy Shortness Of Breath, Rash   Iodinated Contrast Media Itching   Benadryl 50MG  prophylaxis before and after and patient reports she did fine   Latex Itching, Other (See Comments)   Blisters skin   Metoprolol Other (See Comments)   ANY MEDICATION THAT ENDS IN -OLOL-   The patient is asthmatic.   Penicillins Shortness Of Breath, Rash, Other (See Comments)   Has patient had a PCN reaction causing immediate rash, facial/tongue/throat swelling, SOB or lightheadedness with hypotension: Yes Has patient had a PCN reaction causing severe rash involving mucus membranes or skin necrosis: Unknown Has patient had a PCN reaction that required hospitalization No Has patient had a PCN reaction occurring within the last 10 years: No If all of the above answers are "NO", then may proceed with Cephalosporin use.   Red Dye Hives, Shortness Of Breath   Shellfish Allergy Anaphylaxis   Sulfa Antibiotics Hives, Shortness Of Breath   Amoxicillin-pot Clavulanate Nausea And Vomiting   Aspirin Other (See Comments)   Blisters on tongue from higher doses - tolerates 81 mg   Dye Fdc Red  [fd&c Red #2 (amaranth)] Hives   X ray DYE  (BENADRYL 50 MG PROPHYLAXIS AND AFTER  AND  SHE DID FINE, PER PATIENT.).   Tape Dermatitis, Rash   Adhesive   Zolpidem Other (See Comments)   unknown   Talwin [pentazocine] Other (See Comments)   Unknown reaction   Levofloxacin Rash, Other (See Comments)   headache   Seroquel [quetiapine Fumarate] Other (See Comments)   hallucinate        Medication List     STOP taking these medications    buPROPion 300 MG 24 hr tablet Commonly known as: WELLBUTRIN XL   hydrOXYzine 25 MG tablet Commonly known as: ATARAX   oxyCODONE 5 MG immediate release tablet Commonly known as: Roxicodone       TAKE these medications    acyclovir ointment 5 % Commonly known as: ZOVIRAX Apply 1 application topically every 3 (three) hours as needed (mouth sores).   albuterol 108 (90 Base) MCG/ACT inhaler Commonly known as: VENTOLIN HFA Inhale 2 puffs into the lungs every 4 (four) hours as needed for wheezing.   amLODipine 10 MG tablet Commonly known as: NORVASC Take 10 mg by mouth daily.   atorvastatin 20 MG tablet Commonly known as: LIPITOR Take 20 mg by mouth daily.   bisacodyl 10 MG suppository Commonly known as: DULCOLAX Place 1 suppository (10 mg total) rectally daily as needed for moderate constipation.   busPIRone 15 MG tablet Commonly known as: BUSPAR Take 15 mg by mouth 2 (two) times daily as needed.   chlorhexidine 0.12 % solution Commonly known as: PERIDEX 15 mLs by Mouth Rinse route as needed.  EPINEPHrine 0.3 mg/0.3 mL Soaj injection Commonly known as: EPI-PEN Inject into the muscle.   fluticasone 50 MCG/ACT nasal spray Commonly known as: FLONASE Place 1 spray into both nostrils 2 (two) times daily.   Immune Globulin 10% 10 GM/100ML Soln Inject into the vein every 30 (thirty) days.   ipratropium-albuterol 0.5-2.5 (3) MG/3ML Soln Commonly known as: DUONEB Inhale 3 mLs into the lungs every 4 (four) hours as needed for wheezing or shortness of breath.   ketoconazole 2 % cream Commonly known as:  NIZORAL Apply 1 application. topically as needed.   Linzess 145 MCG Caps capsule Generic drug: linaclotide Take 145 mcg by mouth daily.   montelukast 10 MG tablet Commonly known as: SINGULAIR Take 10 mg by mouth at bedtime.   morphine 30 MG 12 hr tablet Commonly known as: MS CONTIN Take 1 tablet (30 mg total) by mouth every 12 (twelve) hours.   nystatin powder Generic drug: nystatin Apply 1 application topically 3 (three) times daily as needed (blistering sores under breast).   oxyCODONE-acetaminophen 10-325 MG tablet Commonly known as: PERCOCET Take 1 tablet by mouth every 6 (six) hours as needed for pain. Hold for sedation   pantoprazole 40 MG tablet Commonly known as: PROTONIX Take 40 mg by mouth every morning.   prazosin 2 MG capsule Commonly known as: MINIPRESS Take 2 mg by mouth at bedtime.   pregabalin 25 MG capsule Commonly known as: Lyrica Take 1 capsule (25 mg total) by mouth 2 (two) times daily.   promethazine 25 MG tablet Commonly known as: PHENERGAN Take 25 mg by mouth every 8 (eight) hours as needed.   QUEtiapine 25 MG tablet Commonly known as: SEROQUEL Take 75 mg by mouth at bedtime.   Spiriva HandiHaler 18 MCG inhalation capsule Generic drug: tiotropium Place 1 capsule (18 mcg total) into inhaler and inhale daily.   tiZANidine 4 MG tablet Commonly known as: ZANAFLEX Take 8 mg by mouth 3 (three) times daily.   Trelegy Ellipta 100-62.5-25 MCG/ACT Aepb Generic drug: Fluticasone-Umeclidin-Vilant Inhale 1 puff into the lungs daily.               Durable Medical Equipment  (From admission, onward)           Start     Ordered   11/14/22 1353  For home use only DME 3 n 1  Once       Comments: Patient requires bedside commode d/t bathroom at home is at the other side of house from where she will. Patient is limited in her mobility at this time d/t tib/fib fracture.   11/14/22 1354   11/14/22 1351  For home use only DME Tub bench  Once         11/14/22 1351   11/14/22 1142  For home use only DME lightweight manual wheelchair with seat cushion  Once       Comments: Patient suffers from tibial-fibular fracture which impairs their ability to perform daily activities like bathing, dressing, and toileting in the home.  A walker will not resolve  issue with performing activities of daily living. A wheelchair will allow patient to safely perform daily activities. Patient is not able to propel themselves in the home using a standard weight wheelchair due to endurance. Patient can self propel in the lightweight wheelchair. Length of need 6 months . Accessories: elevating leg rests (ELRs), wheel locks, extensions and anti-tippers.   11/14/22 1142  Discharge Care Instructions  (From admission, onward)           Start     Ordered   11/15/22 0000  Leave dressing on - Keep it clean, dry, and intact until clinic visit        11/15/22 1352   11/14/22 0000  Leave dressing on - Keep it clean, dry, and intact until clinic visit        11/14/22 1204            Disposition and follow-up:   Ms.Deanna Yang was discharged from [facility] in Stable condition.  At the hospital follow up visit please address:  1.  Follow-up:  a.  Right tib-fib fracture-repaired by Ortho with intramedullary nail.  Needing home health PT.  Pain control with outpatient regimen.    b.  Frequent falls-suspect related to significant pain management regimen, concerned that tizanidine is significant factor.  Would recommend removing some of her centrally acting medications as able.   2.  Labs / imaging needed at time of follow-up: none  3.  Pending labs/ test needing follow-up: none  4.  Medication Changes  Started: tizanidine  Follow-up Appointments:  Follow-up Information     Eartha Inch, MD. Schedule an appointment as soon as possible for a visit in 1 week(s).   Specialty: Family Medicine Contact  information: [address] [place] [address] [phone]         [facility] Follow up.   Specialty: Home Health Services Why: Chip Boer is now known as Archivist. Someone from the office at [place] will contact you to schedule home health visits. Contact information: [address] [place] [address] [phone]                 Hospital Course by problem list:  Patient presented to the emergency department after a fall, was found to have a right tib-fib fracture.  This was repaired by orthopedic surgery with intramedullary nail.  Physical therapy evaluated the patient and recommended home health PT.  Her pain was adequately controlled with morphine 30 mg and oxycodone 5-3 25 2  tablets.  She was transitioned back to her home pain regimen which includes tizanidine.  Patient was subsequently noted to become hypotensive with improper cardiac compensation.  She was otherwise asymptomatic.  She did tell us during further evaluation that she oftentimes will become very drowsy after taking her tizanidine and fall asleep at night.  Given her hypotension she was held for an additional day after repair.  It was recommended to her upon discharge that she follow-up with her pain management doctor for reexamination of her outpatient regimen.  It was also recommended that she stop taking the tizanidine until she can discuss this with her prescriber.  Discharge Subjective: Doing well no complaints for her to go home.  Discharge Exam:   BP 131/77 (BP Location: Left Arm)   Pulse 81   Temp 98.5 F (36.9 C)   Resp 18   Ht 5\' 7"  (1.702 m)   Wt 86.2 kg   SpO2 95%   BMI 29.76 kg/m  Constitutional: no acute distress HENT: normocephalic atraumatic Neck: supple Pulmonary/Chest: normal work of breathing on room air MSK: normal bulk and tone. Right lower extremity with brace and ace bandage in place Neurological: alert & oriented x 3 Skin: warm and  dry Psych: normal mood  Pertinent Labs, Studies, and Procedures:     Latest Ref  Rng & Units 11/15/2022    8:00 AM 11/14/2022    7:09 PM 11/14/2022    3:50 AM  CBC  WBC 4.0 - 10.5 K/uL 9.0  11.3  11.3   Hemoglobin 12.0 - 15.0 g/dL 84.1  9.8  66.0   Hematocrit 36.0 - 46.0 % 32.7  29.3  34.4   Platelets 150 - 400 K/uL 367  332  380        Latest Ref Rng & Units 11/14/2022    7:09 PM 11/14/2022    3:50 AM 11/13/2022    1:58 AM  CMP  Glucose 70 - 99 mg/dL 93  630  160   BUN 6 - 20 mg/dL Creatinine 0.44 - 1.00 mg/dL 1.09  3.23  5.57   Sodium 135 - 145 mmol/L 134  137  138   Potassium 3.5 - 5.1 mmol/L 3.4  3.7  4.2   Chloride 98 - 111 mmol/L 102  102  105   CO2 22 - 32 mmol/L Calcium 8.9 - 10.3 mg/dL 8.2  8.5  8.7     DG Tibia/Fibula Right  Result Date: 11/12/2022 CLINICAL DATA:  Right tibial ORIF EXAM: RIGHT TIBIA AND FIBULA - 2 VIEW COMPARISON:  11/12/2022 FINDINGS: Twelve C-arm fluoroscopic images were obtained intraoperatively and submitted for post operative interpretation. Images demonstrate placement of long intramedullary rod with proximal distal interlocking screws in the right tibia with improved fracture alignment. Fibular fracture alignment is also improved. 388 seconds of fluoroscopy time utilized. Radiation dose: 20.79 mGy. Please see the performing provider's procedural report for further detail. IMPRESSION: Intraoperative fluoroscopy for right tibial ORIF. Electronically Signed   By: Duanne Guess D.O.   On: 11/12/2022 12:15   DG C-Arm 1-60 Min-No Report  Result Date: 11/12/2022 Fluoroscopy was utilized by the requesting physician.  No radiographic interpretation.   DG C-Arm 1-60 Min-No Report  Result Date: 11/12/2022 Fluoroscopy was utilized by the requesting physician.  No radiographic interpretation.   CT HEAD WO CONTRAST  Result Date: 11/12/2022 CLINICAL DATA:  61 year old female with history of trauma from a fall. EXAM: CT HEAD  WITHOUT CONTRAST CT CERVICAL SPINE WITHOUT CONTRAST TECHNIQUE: Multidetector CT imaging of the head and cervical spine was performed following the standard protocol without intravenous contrast. Multiplanar CT image reconstructions of the cervical spine were also generated. RADIATION DOSE REDUCTION: This exam was performed according to the departmental dose-optimization program which includes automated exposure control, adjustment of the mA and/or kV according to patient size and/or use of iterative reconstruction technique. COMPARISON:  CT of the head and cervical spine 04/01/2022. FINDINGS: CT HEAD FINDINGS Brain: No evidence of acute infarction, hemorrhage, hydrocephalus, extra-axial collection or mass lesion/mass effect. Vascular: No hyperdense vessel or unexpected calcification. Skull: Normal. Negative for fracture or focal lesion. Sinuses/Orbits: No acute finding. Other: None. CT CERVICAL SPINE FINDINGS Alignment: Normal. Skull base and vertebrae: Postoperative changes of ACDF from C4-C7 are noted with interbody grafts at C4-C5, C5-C6 and C6-C7. No acute fracture. No primary bone lesion or focal pathologic process. Soft tissues and spinal canal: No prevertebral fluid or swelling. No visible canal hematoma. Disc levels: Postoperative changes of ACDF, as above. Multilevel degenerative disc disease, most apparent at C7-T1. Moderate multilevel facet arthropathy bilaterally. Upper chest: Unremarkable. Other: None. IMPRESSION: 1. No evidence of significant acute traumatic injury to the skull, brain or cervical spine. 2. The appearance of the brain is normal. 3. Postoperative changes  of ACDF from C4-C7, as above. Electronically Signed   By: Trudie Reed M.D.   On: 11/12/2022 05:59   CT CERVICAL SPINE WO CONTRAST  Result Date: 11/12/2022 CLINICAL DATA:  61 year old female with history of trauma from a fall. EXAM: CT HEAD WITHOUT CONTRAST CT CERVICAL SPINE WITHOUT CONTRAST TECHNIQUE: Multidetector CT imaging of  the head and cervical spine was performed following the standard protocol without intravenous contrast. Multiplanar CT image reconstructions of the cervical spine were also generated. RADIATION DOSE REDUCTION: This exam was performed according to the departmental dose-optimization program which includes automated exposure control, adjustment of the mA and/or kV according to patient size and/or use of iterative reconstruction technique. COMPARISON:  CT of the head and cervical spine 04/01/2022. FINDINGS: CT HEAD FINDINGS Brain: No evidence of acute infarction, hemorrhage, hydrocephalus, extra-axial collection or mass lesion/mass effect. Vascular: No hyperdense vessel or unexpected calcification. Skull: Normal. Negative for fracture or focal lesion. Sinuses/Orbits: No acute finding. Other: None. CT CERVICAL SPINE FINDINGS Alignment: Normal. Skull base and vertebrae: Postoperative changes of ACDF from C4-C7 are noted with interbody grafts at C4-C5, C5-C6 and C6-C7. No acute fracture. No primary bone lesion or focal pathologic process. Soft tissues and spinal canal: No prevertebral fluid or swelling. No visible canal hematoma. Disc levels: Postoperative changes of ACDF, as above. Multilevel degenerative disc disease, most apparent at C7-T1. Moderate multilevel facet arthropathy bilaterally. Upper chest: Unremarkable. Other: None. IMPRESSION: 1. No evidence of significant acute traumatic injury to the skull, brain or cervical spine. 2. The appearance of the brain is normal. 3. Postoperative changes of ACDF from C4-C7, as above. Electronically Signed   By: Trudie Reed M.D.   On: 11/12/2022 05:59   CT Tibia Fibula Right Wo Contrast  Result Date: 11/12/2022 CLINICAL DATA:  Leg trauma EXAM: CT OF THE LOWER RIGHT EXTREMITY WITHOUT CONTRAST TECHNIQUE: Multidetector CT imaging of the right lower extremity was performed according to the standard protocol. RADIATION DOSE REDUCTION: This exam was performed according to  the departmental dose-optimization program which includes automated exposure control, adjustment of the mA and/or kV according to patient size and/or use of iterative reconstruction technique. COMPARISON:  None Available. FINDINGS: Bones/Joint/Cartilage Transverse fractures through the upper shaft of the tibia and fibula with medial displacement by over 100%. Greater comminution at the fibular fracture. No evidence of underlying bone lesion. Generalized osteopenia. Ligaments Suboptimally assessed by CT. Muscles and Tendons Expected regional soft tissue swelling. No major musculotendinous disruption seen. Soft tissues Expected soft tissue swelling around the fracture. There is a bubble of gas in the shin anterior to the tibial fracture which could reflect open injury, which should be readily apparent on exam. IMPRESSION: Displaced tibia and fibula shaft fractures.  No focal bone lesion. Electronically Signed   By: Tiburcio Pea M.D.   On: 11/12/2022 05:57   DG Chest Port 1 View  Result Date: 11/12/2022 CLINICAL DATA:  Leg fracture EXAM: PORTABLE CHEST 1 VIEW COMPARISON:  04/01/2022 FINDINGS: Stable heart size and mediastinal contours. Mildly low lung volumes with interstitial crowding. There is no edema, consolidation, effusion, or pneumothorax. ACDF hardware. IMPRESSION: Stable exam.  No acute finding. Electronically Signed   By: Tiburcio Pea M.D.   On: 11/12/2022 05:21   DG Femur Portable Min 2 Views Right  Result Date: 11/12/2022 CLINICAL DATA:  Trauma.  Pain after fall EXAM: RIGHT FEMUR PORTABLE 2 VIEW COMPARISON:  None Available. FINDINGS: Known fractures at the level of the leg. No femur fracture or dislocation. Degenerative spurring  at the medial knee. IMPRESSION: Negative for femur fracture. Electronically Signed   By: Tiburcio Pea M.D.   On: 11/12/2022 05:21   DG Tibia/Fibula Right Port  Result Date: 11/12/2022 CLINICAL DATA:  Trauma with fall. EXAM: PORTABLE RIGHT TIBIA AND FIBULA - 2  VIEW COMPARISON:  None Available. FINDINGS: Transverse medially displaced fractures through the tibial and fibular shafts. Generalized osteopenia without visible focal bone lesion. Expected soft tissue swelling. No knee joint effusion. IMPRESSION: Displaced tibial and fibular shaft fractures. Electronically Signed   By: Tiburcio Pea M.D.   On: 11/12/2022 05:20     Discharge Instructions: Discharge Instructions     Call MD for:  difficulty breathing, headache or visual disturbances   Complete by: As directed    Call MD for:  extreme fatigue   Complete by: As directed    Call MD for:  hives   Complete by: As directed    Call MD for:  persistant dizziness or Deleo-headedness   Complete by: As directed    Call MD for:  persistant nausea and vomiting   Complete by: As directed    Call MD for:  redness, tenderness, or signs of infection (pain, swelling, redness, odor or green/yellow discharge around incision site)   Complete by: As directed    Call MD for:  severe uncontrolled pain   Complete by: As directed    Call MD for:  temperature >100.4   Complete by: As directed    Diet - low sodium heart healthy   Complete by: As directed    Increase activity slowly   Complete by: As directed    Increase activity slowly   Complete by: As directed    Leave dressing on - Keep it clean, dry, and intact until clinic visit   Complete by: As directed    Leave dressing on - Keep it clean, dry, and intact until clinic visit   Complete by: As directed        Signed: Adron Bene, MD 11/15/2022, 4:51 PM   Pager: [phone]

## 2022-11-15 NOTE — Plan of Care (Signed)

## 2022-12-23 DIAGNOSIS — S82201D Unspecified fracture of shaft of right tibia, subsequent encounter for closed fracture with routine healing: Secondary | ICD-10-CM | POA: Diagnosis not present

## 2023-12-14 DIAGNOSIS — D801 Nonfamilial hypogammaglobulinemia: Secondary | ICD-10-CM | POA: Diagnosis not present

## 2023-12-16 DIAGNOSIS — S82871A Displaced pilon fracture of right tibia, initial encounter for closed fracture: Secondary | ICD-10-CM | POA: Diagnosis not present

## 2023-12-16 DIAGNOSIS — N1831 Chronic kidney disease, stage 3a: Secondary | ICD-10-CM | POA: Diagnosis not present

## 2023-12-16 DIAGNOSIS — S82201A Unspecified fracture of shaft of right tibia, initial encounter for closed fracture: Secondary | ICD-10-CM | POA: Diagnosis not present

## 2023-12-16 DIAGNOSIS — J449 Chronic obstructive pulmonary disease, unspecified: Secondary | ICD-10-CM | POA: Diagnosis not present

## 2023-12-16 DIAGNOSIS — G894 Chronic pain syndrome: Secondary | ICD-10-CM | POA: Diagnosis not present

## 2023-12-26 DIAGNOSIS — Z79891 Long term (current) use of opiate analgesic: Secondary | ICD-10-CM | POA: Diagnosis not present

## 2023-12-26 DIAGNOSIS — M5030 Other cervical disc degeneration, unspecified cervical region: Secondary | ICD-10-CM | POA: Diagnosis not present

## 2023-12-26 DIAGNOSIS — M51362 Other intervertebral disc degeneration, lumbar region with discogenic back pain and lower extremity pain: Secondary | ICD-10-CM | POA: Diagnosis not present

## 2023-12-26 DIAGNOSIS — G894 Chronic pain syndrome: Secondary | ICD-10-CM | POA: Diagnosis not present

## 2023-12-27 DIAGNOSIS — D839 Common variable immunodeficiency, unspecified: Secondary | ICD-10-CM | POA: Diagnosis not present

## 2023-12-27 DIAGNOSIS — R079 Chest pain, unspecified: Secondary | ICD-10-CM | POA: Diagnosis not present

## 2023-12-27 DIAGNOSIS — J301 Allergic rhinitis due to pollen: Secondary | ICD-10-CM | POA: Diagnosis not present

## 2023-12-27 DIAGNOSIS — J449 Chronic obstructive pulmonary disease, unspecified: Secondary | ICD-10-CM | POA: Diagnosis not present

## 2023-12-27 DIAGNOSIS — R053 Chronic cough: Secondary | ICD-10-CM | POA: Diagnosis not present

## 2023-12-27 DIAGNOSIS — R918 Other nonspecific abnormal finding of lung field: Secondary | ICD-10-CM | POA: Diagnosis not present

## 2023-12-27 DIAGNOSIS — R0602 Shortness of breath: Secondary | ICD-10-CM | POA: Diagnosis not present

## 2023-12-29 DIAGNOSIS — J449 Chronic obstructive pulmonary disease, unspecified: Secondary | ICD-10-CM | POA: Diagnosis not present

## 2024-01-06 DIAGNOSIS — J439 Emphysema, unspecified: Secondary | ICD-10-CM | POA: Diagnosis not present

## 2024-01-06 DIAGNOSIS — J984 Other disorders of lung: Secondary | ICD-10-CM | POA: Diagnosis not present

## 2024-01-06 DIAGNOSIS — J8410 Pulmonary fibrosis, unspecified: Secondary | ICD-10-CM | POA: Diagnosis not present

## 2024-01-06 DIAGNOSIS — J432 Centrilobular emphysema: Secondary | ICD-10-CM | POA: Diagnosis not present

## 2024-01-06 DIAGNOSIS — D801 Nonfamilial hypogammaglobulinemia: Secondary | ICD-10-CM | POA: Diagnosis not present

## 2024-01-06 DIAGNOSIS — R918 Other nonspecific abnormal finding of lung field: Secondary | ICD-10-CM | POA: Diagnosis not present

## 2024-01-16 DIAGNOSIS — S82871A Displaced pilon fracture of right tibia, initial encounter for closed fracture: Secondary | ICD-10-CM | POA: Diagnosis not present

## 2024-01-16 DIAGNOSIS — G894 Chronic pain syndrome: Secondary | ICD-10-CM | POA: Diagnosis not present

## 2024-01-16 DIAGNOSIS — S82201A Unspecified fracture of shaft of right tibia, initial encounter for closed fracture: Secondary | ICD-10-CM | POA: Diagnosis not present

## 2024-01-16 DIAGNOSIS — N1831 Chronic kidney disease, stage 3a: Secondary | ICD-10-CM | POA: Diagnosis not present

## 2024-01-29 DIAGNOSIS — J449 Chronic obstructive pulmonary disease, unspecified: Secondary | ICD-10-CM | POA: Diagnosis not present

## 2024-02-07 DIAGNOSIS — D801 Nonfamilial hypogammaglobulinemia: Secondary | ICD-10-CM | POA: Diagnosis not present

## 2024-02-13 DIAGNOSIS — G894 Chronic pain syndrome: Secondary | ICD-10-CM | POA: Diagnosis not present

## 2024-02-13 DIAGNOSIS — S82871A Displaced pilon fracture of right tibia, initial encounter for closed fracture: Secondary | ICD-10-CM | POA: Diagnosis not present

## 2024-02-13 DIAGNOSIS — S82201A Unspecified fracture of shaft of right tibia, initial encounter for closed fracture: Secondary | ICD-10-CM | POA: Diagnosis not present

## 2024-02-13 DIAGNOSIS — N1831 Chronic kidney disease, stage 3a: Secondary | ICD-10-CM | POA: Diagnosis not present

## 2024-02-22 ENCOUNTER — Emergency Department (HOSPITAL_COMMUNITY)
Admission: EM | Admit: 2024-02-22 | Discharge: 2024-02-22 | Disposition: A | Discharge status: Home / Self Care | Attending: Emergency Medicine | Admitting: Emergency Medicine

## 2024-02-22 ENCOUNTER — Emergency Department (HOSPITAL_COMMUNITY)

## 2024-02-22 ENCOUNTER — Other Ambulatory Visit: Payer: Self-pay

## 2024-02-22 DIAGNOSIS — Y92009 Unspecified place in unspecified non-institutional (private) residence as the place of occurrence of the external cause: Secondary | ICD-10-CM | POA: Insufficient documentation

## 2024-02-22 DIAGNOSIS — S42022A Displaced fracture of shaft of left clavicle, initial encounter for closed fracture: Secondary | ICD-10-CM | POA: Diagnosis not present

## 2024-02-22 DIAGNOSIS — Z981 Arthrodesis status: Secondary | ICD-10-CM | POA: Diagnosis not present

## 2024-02-22 DIAGNOSIS — M542 Cervicalgia: Secondary | ICD-10-CM | POA: Diagnosis not present

## 2024-02-22 DIAGNOSIS — M79602 Pain in left arm: Secondary | ICD-10-CM | POA: Insufficient documentation

## 2024-02-22 DIAGNOSIS — M19012 Primary osteoarthritis, left shoulder: Secondary | ICD-10-CM | POA: Diagnosis not present

## 2024-02-22 DIAGNOSIS — M79622 Pain in left upper arm: Secondary | ICD-10-CM | POA: Diagnosis not present

## 2024-02-22 DIAGNOSIS — W19XXXA Unspecified fall, initial encounter: Secondary | ICD-10-CM | POA: Diagnosis not present

## 2024-02-22 DIAGNOSIS — R110 Nausea: Secondary | ICD-10-CM | POA: Diagnosis not present

## 2024-02-22 DIAGNOSIS — M79632 Pain in left forearm: Secondary | ICD-10-CM | POA: Diagnosis not present

## 2024-02-22 DIAGNOSIS — I10 Essential (primary) hypertension: Secondary | ICD-10-CM | POA: Diagnosis not present

## 2024-02-22 DIAGNOSIS — Z79899 Other long term (current) drug therapy: Secondary | ICD-10-CM | POA: Diagnosis not present

## 2024-02-22 DIAGNOSIS — R519 Headache, unspecified: Secondary | ICD-10-CM | POA: Insufficient documentation

## 2024-02-22 DIAGNOSIS — R000 Tachycardia, unspecified: Secondary | ICD-10-CM | POA: Diagnosis not present

## 2024-02-22 DIAGNOSIS — J9811 Atelectasis: Secondary | ICD-10-CM | POA: Diagnosis not present

## 2024-02-22 DIAGNOSIS — S199XXA Unspecified injury of neck, initial encounter: Secondary | ICD-10-CM | POA: Diagnosis not present

## 2024-02-22 DIAGNOSIS — S40912A Unspecified superficial injury of left shoulder, initial encounter: Secondary | ICD-10-CM | POA: Diagnosis present

## 2024-02-22 DIAGNOSIS — S0990XA Unspecified injury of head, initial encounter: Secondary | ICD-10-CM | POA: Diagnosis not present

## 2024-02-22 DIAGNOSIS — M85832 Other specified disorders of bone density and structure, left forearm: Secondary | ICD-10-CM | POA: Diagnosis not present

## 2024-02-22 DIAGNOSIS — R0902 Hypoxemia: Secondary | ICD-10-CM | POA: Diagnosis not present

## 2024-02-22 DIAGNOSIS — M19032 Primary osteoarthritis, left wrist: Secondary | ICD-10-CM | POA: Diagnosis not present

## 2024-02-22 LAB — BASIC METABOLIC PANEL
Anion gap: 11 (ref 5–15)
BUN: 13 mg/dL (ref 8–23)
CO2: 15 mmol/L — ABNORMAL LOW (ref 22–32)
Calcium: 6.5 mg/dL — ABNORMAL LOW (ref 8.9–10.3)
Chloride: 115 mmol/L — ABNORMAL HIGH (ref 98–111)
Creatinine, Ser: 1.08 mg/dL — ABNORMAL HIGH (ref 0.44–1.00)
GFR, Estimated: 58 mL/min — ABNORMAL LOW (ref >60)
Glucose, Bld: 97 mg/dL (ref 70–99)
Potassium: 3.8 mmol/L (ref 3.5–5.1)
Sodium: 141 mmol/L (ref 135–145)

## 2024-02-22 LAB — CBC WITH DIFFERENTIAL/PLATELET
Abs Immature Granulocytes: 0.06 10*3/uL (ref 0.00–0.07)
Basophils Absolute: 0.1 10*3/uL (ref 0.0–0.1)
Basophils Relative: 1 %
Eosinophils Absolute: 0.3 10*3/uL (ref 0.0–0.5)
Eosinophils Relative: 3 %
HCT: 39.1 % (ref 36.0–46.0)
Hemoglobin: 11.9 g/dL — ABNORMAL LOW (ref 12.0–15.0)
Immature Granulocytes: 1 %
Lymphocytes Relative: 18 %
Lymphs Abs: 1.9 10*3/uL (ref 0.7–4.0)
MCH: 28.6 pg (ref 26.0–34.0)
MCHC: 30.4 g/dL (ref 30.0–36.0)
MCV: 94 fL (ref 80.0–100.0)
Monocytes Absolute: 0.5 10*3/uL (ref 0.1–1.0)
Monocytes Relative: 5 %
Neutro Abs: 7.4 10*3/uL (ref 1.7–7.7)
Neutrophils Relative %: 72 %
Platelets: 285 10*3/uL (ref 150–400)
RBC: 4.16 MIL/uL (ref 3.87–5.11)
RDW: 13.5 % (ref 11.5–15.5)
WBC: 10.3 10*3/uL (ref 4.0–10.5)
nRBC: 0 % (ref 0.0–0.2)

## 2024-02-22 MED ORDER — HYDROMORPHONE HCL 1 MG/ML IJ SOLN
1.0000 mg | Freq: Once | INTRAMUSCULAR | Status: AC
  Administered 2024-02-22: 1 mg via INTRAVENOUS
  Filled 2024-02-22: qty 1

## 2024-02-22 NOTE — Discharge Instructions (Signed)
 Please read and follow all provided instructions.  Your diagnoses today include:  1. Closed displaced fracture of shaft of left clavicle, initial encounter   2. Left arm pain   3. Fall, initial encounter     Tests performed today include: An x-ray of the affected area -shows broken collarbone, otherwise x-rays were negative.   Head CT and cervical spine CT were negative Vital signs. See below for your results today.   Medications prescribed:  None Take any prescribed medications only as directed.  Home care instructions:  Follow any educational materials contained in this packet Follow R.I.C.E. Protocol: R - rest your injury  I  - use ice on injury without applying directly to skin C - compress injury with bandage or splint E - elevate the injury as much as possible  Follow-up instructions: Please follow-up with your orthopedic surgeon or the orthopedic referral in the next 1 week.  Return instructions:  Please return if your fingers are numb or tingling, appear gray or blue, or you have severe pain (also elevate the arm and loosen splint or wrap if you were given one) Please return to the Emergency Department if you experience worsening symptoms.  Please return if you have any other emergent concerns.  Additional Information:  Your vital signs today were: BP (!) 135/98   Pulse 96   Temp 98.3 F (36.8 C) (Oral)   Resp 16   Ht 5\' 7"  (1.702 m)   Wt 86 kg   SpO2 93%   BMI 29.69 kg/m  If your blood pressure (BP) was elevated above 135/85 this visit, please have this repeated by your doctor within one month. --------------

## 2024-02-22 NOTE — ED Triage Notes (Signed)
 Pt to the ed from home with a CC of fall from weakness to right leg. Pt relay she hit her head when she fell and is having significant left sided pain. Pt has hx of recent falls with previous injury to right leg.pt denies blood thinners, any dizziness, loc at this time.

## 2024-02-22 NOTE — ED Notes (Signed)
 Patient verbalizes understanding of discharge instructions. Opportunity for questioning and answers were provided. Pt discharged from ED.

## 2024-02-22 NOTE — ED Provider Notes (Signed)
 [place] EMERGENCY DEPARTMENT AT [facility] Provider Note   CSN: [id number] Arrival date & time: 02/22/24  4098     History  Chief Complaint  Patient presents with   Deanna Yang is a 63 y.o. female.  Patient with h/o chronic pain (Morphine ER 30mg  bid/oxycodone 10mg  qid), previous back surgery, R tib/fib fx s/p ORIF 11/2022, HTN, COPD --presents to the emergency department today after a fall.  Patient states that she was on her couch and her foot started burning.  This is not a new occurrence.  She stood up and became unsteady and fell.  She struck the left back of her head on a table.  She fell onto her left shoulder and arm.  She has pain in her head and neck, left clavicle, left upper arm, left forearm.  She denies hip pain.  She denies any associated lightheadedness or syncope but states that she has not been eating and drinking well.  She states that sometimes she will have constipation and other times have diarrhea.  Patient was transported by EMS.  200 mcg of fentanyl given and route.  Placed and placed in sling.       Home Medications Prior to Admission medications   Medication Sig Start Date End Date Taking? Authorizing Provider  acyclovir ointment (ZOVIRAX) 5 % Apply 1 application topically every 3 (three) hours as needed (mouth sores).  01/27/18   [provider]  albuterol (VENTOLIN HFA) 108 (90 Base) MCG/ACT inhaler Inhale 2 puffs into the lungs every 4 (four) hours as needed for wheezing. 06/30/17   [provider]  amLODipine (NORVASC) 10 MG tablet Take 10 mg by mouth daily.  07/05/17   [provider]  atorvastatin (LIPITOR) 20 MG tablet Take 20 mg by mouth daily. 10/25/22   [provider]  bisacodyl (DULCOLAX) 10 MG suppository Place 1 suppository (10 mg total) rectally daily as needed for moderate constipation. 08/09/18   Joseph Art, DO  busPIRone (BUSPAR) 15 MG tablet Take 15 mg by mouth 2 (two) times  daily as needed. 03/11/22   [provider]  chlorhexidine (PERIDEX) 0.12 % solution 15 mLs by Mouth Rinse route as needed. 10/27/21   [provider]  EPINEPHrine 0.3 mg/0.3 mL IJ SOAJ injection Inject into the muscle. 08/31/22   [provider]  fluticasone (FLONASE) 50 MCG/ACT nasal spray Place 1 spray into both nostrils 2 (two) times daily. 05/31/18   [provider]  Immune Globulin 10% (GAMMAGARD) 10 GM/100ML SOLN Inject into the vein every 30 (thirty) days.    [provider]  ipratropium-albuterol (DUONEB) 0.5-2.5 (3) MG/3ML SOLN Inhale 3 mLs into the lungs every 4 (four) hours as needed for wheezing or shortness of breath. 04/15/17   [provider]  ketoconazole (NIZORAL) 2 % cream Apply 1 application. topically as needed. 01/25/22   [provider]  LINZESS 145 MCG CAPS capsule Take 145 mcg by mouth daily. 03/11/22   [provider]  montelukast (SINGULAIR) 10 MG tablet Take 10 mg by mouth at bedtime. 05/01/18   [provider]  morphine (MS CONTIN) 30 MG 12 hr tablet Take 1 tablet (30 mg total) by mouth every 12 (twelve) hours. 04/28/17   Narda Bonds, MD  NYSTATIN powder Apply 1 application topically 3 (three) times daily as needed (blistering sores under breast).  05/23/17   [provider]  oxyCODONE-acetaminophen (PERCOCET) 10-325 MG tablet Take 1 tablet by mouth  every 6 (six) hours as needed for pain. Hold for sedation 08/09/18   Joseph Art, DO  pantoprazole (PROTONIX) 40 MG tablet Take 40 mg by mouth every morning. 03/11/22   [provider]  prazosin (MINIPRESS) 2 MG capsule Take 2 mg by mouth at bedtime.  05/03/18   [provider]  pregabalin (LYRICA) 25 MG capsule Take 1 capsule (25 mg total) by mouth 2 (two) times daily. 04/01/22 04/01/23  Emeline General, MD  promethazine (PHENERGAN) 25 MG tablet Take 25 mg by mouth every 8 (eight) hours as needed. 03/11/22   [provider]  QUEtiapine (SEROQUEL) 25 MG tablet Take 75 mg by mouth at bedtime. 08/06/18   [provider]  tiotropium (SPIRIVA HANDIHALER) 18 MCG inhalation capsule Place 1 capsule (18 mcg total) into inhaler and inhale daily. 04/01/22 04/01/23  Emeline General, MD  tiZANidine (ZANAFLEX) 4 MG tablet Take 8 mg by mouth 3 (three) times daily. 03/15/22   [provider]  TRELEGY ELLIPTA 100-62.5-25 MCG/ACT AEPB Inhale 1 puff into the lungs daily. 03/22/22   [provider]  amphetamine-dextroamphetamine (ADDERALL) 30 MG tablet Take 30 mg by mouth 2 (two) times daily.  10/02/15  [provider]  FLUoxetine (PROZAC) 40 MG capsule Take 1 capsule (40 mg total) by mouth daily. 10/02/15 01/10/17  Thresa Ross, MD      Allergies    Ambien [zolpidem tartrate], Beta adrenergic blockers, Fish allergy, Iodinated contrast media, Latex, Metoprolol, Penicillins, Red dye #40 (allura red), Shellfish allergy, Sulfa antibiotics, Amoxicillin-pot clavulanate, Aspirin, Dye fdc red  [red dye #2 (amaranth)], Tape, Zolpidem, Talwin [pentazocine], Levofloxacin, and Seroquel [quetiapine fumarate]    Review of Systems   Review of Systems  Physical Exam Updated Vital Signs BP 130/87 (BP Location: Right Arm)   Pulse 98   Temp 97.7 F (36.5 C) (Oral)   Resp 16   Ht 5\' 7"  (1.702 m)   Wt 86 kg   SpO2 96%   BMI 29.69 kg/m   Physical Exam Vitals and nursing note reviewed.  Constitutional:      General: She is not in acute distress.    Appearance: She is well-developed.  HENT:     Head: Normocephalic and atraumatic.     Right Ear: External ear normal.     Left Ear: External ear normal.     Nose: Nose normal.  Eyes:     Conjunctiva/sclera: Conjunctivae normal.  Cardiovascular:     Rate and Rhythm: Normal rate and regular rhythm.     Heart sounds: No murmur heard. Pulmonary:     Effort: No respiratory distress.     Breath sounds: No wheezing, rhonchi or rales.  Abdominal:     Palpations:  Abdomen is soft.     Tenderness: There is no abdominal tenderness. There is no guarding or rebound.  Musculoskeletal:     Left shoulder: Deformity (left clavicle) and tenderness present. No bony tenderness. Decreased range of motion.     Left upper arm: Tenderness present.     Left elbow: Normal range of motion. No tenderness.     Left forearm: Tenderness present. No deformity or bony tenderness.     Left wrist: No bony tenderness. Normal range of motion.     Cervical back: Normal range of motion and neck supple. Tenderness present. Normal range of motion.     Thoracic back: No tenderness.     Lumbar back: No tenderness.     Right hip: No tenderness.  Normal range of motion.     Left hip: No tenderness. Normal range of motion.     Right lower leg: No tenderness. No edema.     Left lower leg: No tenderness. No edema.     Comments: Slight fracture deformity noted without tense tenting of skin overlying.   Skin:    General: Skin is warm and dry.     Findings: No rash.  Neurological:     General: No focal deficit present.     Mental Status: She is alert. Mental status is at baseline.     Motor: No weakness.  Psychiatric:        Mood and Affect: Mood normal.     ED Results / Procedures / Treatments   Labs (all labs ordered are listed, but only abnormal results are displayed) Labs Reviewed - No data to display  EKG None  Radiology CT Head Wo Contrast Result Date: 02/22/2024 CLINICAL DATA:  Neck trauma with dangerous injury mechanism EXAM: CT HEAD WITHOUT CONTRAST CT CERVICAL SPINE WITHOUT CONTRAST TECHNIQUE: Multidetector CT imaging of the head and cervical spine was performed following the standard protocol without intravenous contrast. Multiplanar CT image reconstructions of the cervical spine were also generated. RADIATION DOSE REDUCTION: This exam was performed according to the departmental dose-optimization program which includes automated exposure control, adjustment of the mA  and/or kV according to patient size and/or use of iterative reconstruction technique. COMPARISON:  11/12/2022 FINDINGS: CT HEAD FINDINGS Brain: No evidence of acute infarction, hemorrhage, hydrocephalus, extra-axial collection or mass lesion/mass effect. Vascular: No hyperdense vessel or unexpected calcification. Skull: Negative for fracture Sinuses/Orbits: No acute finding. CT CERVICAL SPINE FINDINGS Alignment: No traumatic malalignment. Degenerative anterolisthesis at C3-4. Skull base and vertebrae: C4-C7 ACDF with solid arthrodesis. No acute finding in the cervical spine. There is a known left mid clavicle fracture by prior radiography. Soft tissues and spinal canal: No prevertebral fluid or swelling. No visible canal hematoma. Disc levels: ACDF with solid arthrodesis. The open levels show disc and facet degeneration. Upper chest: Atelectatic type change with no visible injury. IMPRESSION: No evidence of acute intracranial or cervical spine injury. Electronically Signed   By: Tiburcio Pea M.D.   On: 02/22/2024 10:08   CT Cervical Spine Wo Contrast Result Date: 02/22/2024 CLINICAL DATA:  Neck trauma with dangerous injury mechanism EXAM: CT HEAD WITHOUT CONTRAST CT CERVICAL SPINE WITHOUT CONTRAST TECHNIQUE: Multidetector CT imaging of the head and cervical spine was performed following the standard protocol without intravenous contrast. Multiplanar CT image reconstructions of the cervical spine were also generated. RADIATION DOSE REDUCTION: This exam was performed according to the departmental dose-optimization program which includes automated exposure control, adjustment of the mA and/or kV according to patient size and/or use of iterative reconstruction technique. COMPARISON:  11/12/2022 FINDINGS: CT HEAD FINDINGS Brain: No evidence of acute infarction, hemorrhage, hydrocephalus, extra-axial collection or mass lesion/mass effect. Vascular: No hyperdense vessel or unexpected calcification. Skull: Negative  for fracture Sinuses/Orbits: No acute finding. CT CERVICAL SPINE FINDINGS Alignment: No traumatic malalignment. Degenerative anterolisthesis at C3-4. Skull base and vertebrae: C4-C7 ACDF with solid arthrodesis. No acute finding in the cervical spine. There is a known left mid clavicle fracture by prior radiography. Soft tissues and spinal canal: No prevertebral fluid or swelling. No visible canal hematoma. Disc levels: ACDF with solid arthrodesis. The open levels show disc and facet degeneration. Upper chest: Atelectatic type change with no visible injury. IMPRESSION: No evidence of acute intracranial or cervical spine injury. Electronically Signed  By: Tiburcio Pea M.D.   On: 02/22/2024 10:08   DG Forearm Left Result Date: 02/22/2024 CLINICAL DATA:  Fall.  Left forearm pain. EXAM: LEFT FOREARM - 2 VIEW COMPARISON:  None Available. FINDINGS: There is diffuse osteopenia of the visualized osseous structures. No acute fracture or dislocation. No aggressive osseous lesion. Mild diffuse degenerative changes of the imaged joints. No radiopaque foreign bodies. Soft tissues are within normal limits. IMPRESSION: *No acute osseous abnormality of the left forearm. Electronically Signed   By: Jules Schick M.D.   On: 02/22/2024 09:52   DG Humerus Left Result Date: 02/22/2024 CLINICAL DATA:  Fall.  Left upper arm pain. EXAM: LEFT HUMERUS - 2+ VIEW COMPARISON:  None Available. FINDINGS: No acute fracture or dislocation. No aggressive osseous lesion. Mild degenerative changes of left acromioclavicular and glenohumeral joints. The joints are otherwise normal in alignment. No radiopaque foreign bodies. Soft tissues are within normal limits. IMPRESSION: *No acute osseous abnormality of the left humerus. Electronically Signed   By: Jules Schick M.D.   On: 02/22/2024 09:51   DG Clavicle Left Result Date: 02/22/2024 CLINICAL DATA:  Fall.  Clavicle deformity. EXAM: LEFT CLAVICLE - 2+ VIEWS COMPARISON:  None Available.  FINDINGS: There is acute, 1 shaft width displaced fracture through the middle third of the shaft of the left clavicle. The lateral fracture fragment is displaced inferiorly. No other acute fracture or dislocation. No aggressive osseous lesion. Mild degenerative changes of the left shoulder joints. No radiopaque foreign bodies. Soft tissues are within normal limits. Lower cervical spinal fixation hardware noted. IMPRESSION: Acute displaced fracture through the middle third of the shaft of the left clavicle. Electronically Signed   By: Jules Schick M.D.   On: 02/22/2024 09:50    Procedures Procedures    Medications Ordered in ED Medications - No data to display  ED Course/ Medical Decision Making/ A&P Clinical Course as of 02/22/24 1043  Thu Feb 22, 2024  1024 Creatinine(!): 1.08 [JM]    Clinical Course User Index [JM] Sandi Raveling, [place]    Patient seen and examined. History obtained directly from patient.  Reviewed previous hospitalization notes.  Labs/EKG: Ordered CBC, BMP, EKG.  Imaging: Ordered CT head and cervical spine; x-ray of the left clavicle, left upper arm, left forearm.  Medications/Fluids: None ordered  Most recent vital signs reviewed and are as follows: BP 130/87 (BP Location: Right Arm)   Pulse 98   Temp 97.7 F (36.5 C) (Oral)   Resp 16   Ht 5\' 7"  (1.702 m)   Wt 86 kg   SpO2 96%   BMI 29.69 kg/m   Initial impression: Fall  11:45 AM Reassessment performed. Patient appears stable.  Left upper extremity exam reperformed.  No distal numbness, cap refill less than 2 seconds, radial pulse 2+.  Labs personally reviewed and interpreted including: CBC with minimal anemia hemoglobin 11.9 otherwise unremarkable; BMP calcium slightly low at 6.5  Imaging personally visualized and interpreted including: CT head and cervical spine agree negative.  Reviewed pertinent lab work and imaging with patient at bedside. Questions answered.   Most current vital  signs reviewed and are as follows: BP (!) 135/98   Pulse 96   Temp 98.3 F (36.8 C) (Oral)   Resp 16   Ht 5\' 7"  (1.702 m)   Wt 86 kg   SpO2 93%   BMI 29.69 kg/m   Plan: Discharge to home.   Prescriptions written for: None  Other home care instructions discussed: RICE  protocol, sling use  ED return instructions discussed: New or worsening symptoms  Follow-up instructions discussed: Patient encouraged to follow-up with orthopedics and PCP in 1 week.                                Medical Decision Making Amount and/or Complexity of Data Reviewed Labs: ordered. Radiology: ordered.  Risk Prescription drug management.   Patient presents after a mechanical fall.  She has a left clavicle fracture.  Also a degree of hypocalcemia.  Head and neck CT were negative.  Otherwise she has pain in her left arm, otherwise negative imaging.  Pain controlled.  Patient will follow-up as outpatient.          Final Clinical Impression(s) / ED Diagnoses Final diagnoses:  Closed displaced fracture of shaft of left clavicle, initial encounter  Left arm pain  Fall, initial encounter  Hypocalcemia    Rx / DC Orders ED Discharge Orders     None         Renne Crigler, Cordelia Poche 02/22/24 1152    Benjiman Core, MD 02/22/24 1551

## 2024-02-26 DIAGNOSIS — J449 Chronic obstructive pulmonary disease, unspecified: Secondary | ICD-10-CM | POA: Diagnosis not present

## 2024-02-29 DIAGNOSIS — M25572 Pain in left ankle and joints of left foot: Secondary | ICD-10-CM | POA: Diagnosis not present

## 2024-02-29 DIAGNOSIS — M25512 Pain in left shoulder: Secondary | ICD-10-CM | POA: Diagnosis not present

## 2024-02-29 DIAGNOSIS — R0781 Pleurodynia: Secondary | ICD-10-CM | POA: Diagnosis not present

## 2024-03-01 ENCOUNTER — Encounter (HOSPITAL_BASED_OUTPATIENT_CLINIC_OR_DEPARTMENT_OTHER): Payer: Self-pay | Admitting: Orthopedic Surgery

## 2024-03-01 NOTE — Progress Notes (Signed)
 While talking to patient on preop call she stated that she uses her O2 every night and when she lays down for naps during the day. I told her that she will need to have her surgery at the hospital for closer monitoring, I called Tresa Endo at Dr Murphy's office to let her know that the patient will need to be moved to the Main OR.

## 2024-03-01 NOTE — Progress Notes (Signed)
 Patient scheduled for ORIF wrist fx with Dr Eulah Pont on Tuesday 3-35-25 at [facility]. When I called the patient she answered the phone but all she did was mumble incoherently. I tried several times to talk to her without success. I hung up and tried to call again but there was no answer. I called Tresa Endo at Dr Murphy's office to let her know that I could not talk with the patient. She said she would try to reach her as well.

## 2024-03-04 NOTE — Progress Notes (Signed)
 Anesthesia Review:  PCP: Cardiologist : Psych- LOV 01/04/24  Pulm-  Roxana Hires- LVO 12/27/23   PPM/ ICD: Device Orders: Rep Notified:  Chest x-ray : EKG : 02/23/24  Echo : Stress test: Cardiac Cath :   Activity level:  Sleep Study/ CPAP : Fasting Blood Sugar :      / Checks Blood Sugar -- times a day:    Blood Thinner/ Instructions /Last Dose: ASA / Instructions/ Last Dose :    02/22/24- bmp and cbc/diff in epic  02/22/24- In ED    03/07/24- at 1505- [phone]-[phone] full                 At [phone]-LVMM                AT 1510-LVMM at [phone]-call could ot be comple [phone]-call could not be completed  -  calle [phone]-Betty Blossom and she gave me number of [phone] -called and LVMM

## 2024-03-05 NOTE — Patient Instructions (Signed)
 SURGICAL WAITING ROOM VISITATION  Patients having surgery or a procedure may have no more than 2 support people in the waiting area - these visitors may rotate.    Children under the age of 60 must have an adult with them who is not the patient.  Due to an increase in RSV and influenza rates and associated hospitalizations, children ages 58 and under may not visit patients in [facility] hospitals.  Visitors with respiratory illnesses are discouraged from visiting and should remain at home.  If the patient needs to stay at the hospital during part of their recovery, the visitor guidelines for inpatient rooms apply. Pre-op nurse will coordinate an appropriate time for 1 support person to accompany patient in pre-op.  This support person may not rotate.    Please refer to the [facility] website for the visitor guidelines for Inpatients (after your surgery is over and you are in a regular room).       Your procedure is scheduled on: 03/12/2024    Report to [facility] Main Entrance    Report to admitting at   1030AM   Call this number if you have problems the morning of surgery [phone]   Do not eat food :After Midnight.   After Midnight you may have the following liquids until _ 0930_____ AM DAY OF SURGERY  Water Non-Citrus Juices (without pulp, NO RED-Apple, White grape, White cranberry) Black Coffee (NO MILK/CREAM OR CREAMERS, sugar ok)  Clear Tea (NO MILK/CREAM OR CREAMERS, sugar ok) regular and decaf                             Plain Jell-O (NO RED)                                           Fruit ices (not with fruit pulp, NO RED)                                     Popsicles (NO RED)                                                               Sports drinks like Gatorade (NO RED)                   The day of surgery:  Drink ONE (1) Pre-Surgery Clear Ensure or G2 at  0930AM ( have completed by )  the morning of surgery. Drink in one sitting. Do not sip.  This  drink was given to you during your hospital  pre-op appointment visit. Nothing else to drink after completing the  Pre-Surgery Clear Ensure or G2.          If you have questions, please contact your surgeon's office.      Oral Hygiene is also important to reduce your risk of infection.  Remember - BRUSH YOUR TEETH THE MORNING OF SURGERY WITH YOUR REGULAR TOOTHPASTE  DENTURES WILL BE REMOVED PRIOR TO SURGERY PLEASE DO NOT APPLY "Poly grip" OR ADHESIVES!!!   Do NOT smoke after Midnight   Stop all vitamins and herbal supplements 7 days before surgery.   Take these medicines the morning of surgery with A SIP OF WATER:  Inhalers as usual and bring amlodipione, cymbalta, nebuilizer if needed, gabapentin, MS Contin   DO NOT TAKE ANY ORAL DIABETIC MEDICATIONS DAY OF YOUR SURGERY  Bring CPAP mask and tubing day of surgery.                              You may not have any metal on your body including hair pins, jewelry, and body piercing             Do not wear make-up, lotions, powders, perfumes/cologne, or deodorant  Do not wear nail polish including gel and S&S, artificial/acrylic nails, or any other type of covering on natural nails including finger and toenails. If you have artificial nails, gel coating, etc. that needs to be removed by a nail salon please have this removed prior to surgery or surgery may need to be canceled/ delayed if the surgeon/ anesthesia feels like they are unable to be safely monitored.   Do not shave  48 hours prior to surgery.               Men may shave face and neck.   Do not bring valuables to the hospital. [facility] IS NOT             RESPONSIBLE   FOR VALUABLES.   Contacts, glasses, dentures or bridgework may not be worn into surgery.   Bring small overnight bag day of surgery.   DO NOT BRING YOUR HOME MEDICATIONS TO THE HOSPITAL. PHARMACY WILL DISPENSE MEDICATIONS LISTED ON YOUR MEDICATION LIST TO YOU DURING  YOUR ADMISSION IN THE HOSPITAL!    Patients discharged on the day of surgery will not be allowed to drive home.  Someone NEEDS to stay with you for the first 24 hours after anesthesia.   Special Instructions: Bring a copy of your healthcare power of attorney and living will documents the day of surgery if you haven't scanned them before.              Please read over the following fact sheets you were given: IF YOU HAVE QUESTIONS ABOUT YOUR PRE-OP INSTRUCTIONS PLEASE CALL [phone]   If you received a COVID test during your pre-op visit  it is requested that you wear a mask when out in public, stay away from anyone that may not be feeling well and notify your surgeon if you develop symptoms. If you test positive for Covid or have been in contact with anyone that has tested positive in the last 10 days please notify you surgeon.    [place] - Preparing for Surgery Before surgery, you can play an important role.  Because skin is not sterile, your skin needs to be as free of germs as possible.  You can reduce the number of germs on your skin by washing with CHG (chlorahexidine gluconate) soap before surgery.  CHG is an antiseptic cleaner which kills germs and bonds with the skin to continue killing germs even after washing. Please DO NOT use if you have an allergy to CHG or antibacterial soaps.  If your skin  becomes reddened/irritated stop using the CHG and inform your nurse when you arrive at Short Stay. Do not shave (including legs and underarms) for at least 48 hours prior to the first CHG shower.  You may shave your face/neck. Please follow these instructions carefully:  1.  Shower with CHG Soap the night before surgery and the  morning of Surgery.  2.  If you choose to wash your hair, wash your hair first as usual with your  normal  shampoo.  3.  After you shampoo, rinse your hair and body thoroughly to remove the  shampoo.                           4.  Use CHG as you would any other liquid  soap.  You can apply chg directly  to the skin and wash                       Gently with a scrungie or clean washcloth.  5.  Apply the CHG Soap to your body ONLY FROM THE NECK DOWN.   Do not use on face/ open                           Wound or open sores. Avoid contact with eyes, ears mouth and genitals (private parts).                       Wash face,  Genitals (private parts) with your normal soap.             6.  Wash thoroughly, paying special attention to the area where your surgery  will be performed.  7.  Thoroughly rinse your body with warm water from the neck down.  8.  DO NOT shower/wash with your normal soap after using and rinsing off  the CHG Soap.                9.  Pat yourself dry with a clean towel.            10.  Wear clean pajamas.            11.  Place clean sheets on your bed the night of your first shower and do not  sleep with pets. Day of Surgery : Do not apply any lotions/deodorants the morning of surgery.  Please wear clean clothes to the hospital/surgery center.  FAILURE TO FOLLOW THESE INSTRUCTIONS MAY RESULT IN THE CANCELLATION OF YOUR SURGERY PATIENT SIGNATURE_________________________________  NURSE SIGNATURE__________________________________  ________________________________________________________________________

## 2024-03-07 ENCOUNTER — Encounter (HOSPITAL_COMMUNITY)
Admission: RE | Admit: 2024-03-07 | Discharge: 2024-03-07 | Disposition: A | Discharge status: Home / Self Care | Source: Ambulatory Visit | Attending: Family Medicine | Admitting: Family Medicine

## 2024-03-07 DIAGNOSIS — D801 Nonfamilial hypogammaglobulinemia: Secondary | ICD-10-CM | POA: Diagnosis not present

## 2024-03-07 NOTE — H&P (Signed)
 PREOPERATIVE H&P  Chief Complaint: FRACTURE, LEFT CLAVICLE  HPI: Deanna Yang is a 63 y.o. female who presents with a diagnosis of FRACTURE, LEFT CLAVICLE. Symptoms are rated as moderate to severe, and have been worsening.  This is significantly impairing activities of daily living.  She has elected for surgical management.   Past Medical History:  Diagnosis Date   Anxiety    Arthritis    Chronic kidney disease    stage 2   COPD (chronic obstructive pulmonary disease) (HCC)    Depression    Dyspnea    On 3L oxygen when laying down   Fatty liver 07/23/2018   Fibromyalgia    GERD (gastroesophageal reflux disease)    Headache    HSV infection    Hypertension    Manic depression (HCC)    Neuropathy    hands   OSA (obstructive sleep apnea)    does not use CPAP   Pneumonia    "couple of times"   Past Surgical History:  Procedure Laterality Date   ABDOMINAL SURGERY     APPENDECTOMY     BACK SURGERY     BREAST REDUCTION SURGERY     with lift   CARPAL TUNNEL RELEASE Bilateral    CESAREAN SECTION     CHOLECYSTECTOMY     COLONOSCOPY WITH PROPOFOL     FLEXOR TENOTOMY  Right 04/07/2022   Procedure: BRACHIORADIALIS RELEASE;  Surgeon: Betha Loa, MD;  Location: [facility];  Service: Orthopedics;  Laterality: Right;   HERNIA REPAIR     JOINT REPLACEMENT Left    knee   NECK SURGERY     with rod   OPEN REDUCTION INTERNAL FIXATION (ORIF) DISTAL RADIAL FRACTURE Right 04/07/2022   Procedure: OPEN REDUCTION INTERNAL FIXATION (ORIF) RIGHT DISTAL RADIUS FRACTURE;  Surgeon: Betha Loa, MD;  Location: [facility];  Service: Orthopedics;  Laterality: Right;  75 MIN   TIBIA IM NAIL INSERTION Right 11/12/2022   Procedure: INTRAMEDULLARY (IM) NAIL TIBIAL;  Surgeon: Netta Cedars, MD;  Location: [facility];  Service: Orthopedics;  Laterality: Right;   TONSILLECTOMY     TOOTH EXTRACTION     with sedation   UPPER GI ENDOSCOPY     Social History   Socioeconomic History   Marital status: Legally  Separated    Spouse name: Not on file   Number of children: Not on file   Years of education: Not on file   Highest education level: Not on file  Occupational History   Not on file  Tobacco Use   Smoking status: Former    Current packs/day: 0.00    Types: Cigarettes    Quit date: 01/17/2017    Years since quitting: 7.1   Smokeless tobacco: Never  Vaping Use   Vaping status: Never Used  Substance and Sexual Activity   Alcohol use: No    Comment: last use 2-3 years ago   Drug use: No   Sexual activity: Not Currently    Birth control/protection: Post-menopausal  Other Topics Concern   Not on file  Social History Narrative   Not on file   Social Drivers of Health   Financial Resource Strain: High Risk (02/22/2023)   Received from [facility], [facility]   Overall Financial Resource Strain (CARDIA)    Difficulty of Paying Living Expenses: Hard  Food Insecurity: Food Insecurity Present (02/22/2023)   Received from [facility], [facility]   Hunger Vital Sign    Worried About Running Out of Food in  the Last Year: Sometimes true    Ran Out of Food in the Last Year: Sometimes true  Transportation Needs: No Transportation Needs (02/22/2023)   Received from [facility], [facility]   PRAPARE - Transportation    Lack of Transportation (Medical): No    Lack of Transportation (Non-Medical): No  Physical Activity: Unknown (02/22/2023)   Received from [facility], [facility]   Exercise Vital Sign    Days of Exercise per Week: 0 days    Minutes of Exercise per Session: Not on file  Stress: Stress Concern Present (02/22/2023)   Received from [facility] - Occupational Stress Questionnaire    Feeling of Stress : Rather much  Social Connections: Socially Isolated (02/22/2023)   Received from [facility], [facility]   Social Network    How would you rate your social network (family, work, friends)?: Little  participation, lonely and socially isolated   Family History  Problem Relation Age of Onset   Alcohol abuse Mother    Alcohol abuse Father    Depression Sister    Dementia Neg Hx    Allergies  Allergen Reactions   Ambien [Zolpidem Tartrate] Shortness Of Breath and Swelling    Tongue swelling, throat swelling.    Beta Adrenergic Blockers Anaphylaxis   Fish Allergy Shortness Of Breath and Rash   Iodinated Contrast Media Itching    Benadryl 50MG  prophylaxis before and after and patient reports she did fine   Latex Itching and Other (See Comments)    Blisters skin   Metoprolol Other (See Comments)    ANY MEDICATION THAT ENDS IN -OLOL-   The patient is asthmatic.   Penicillins Shortness Of Breath, Rash and Other (See Comments)    Has patient had a PCN reaction causing immediate rash, facial/tongue/throat swelling, SOB or lightheadedness with hypotension: Yes Has patient had a PCN reaction causing severe rash involving mucus membranes or skin necrosis: Unknown Has patient had a PCN reaction that required hospitalization No Has patient had a PCN reaction occurring within the last 10 years: No If all of the above answers are "NO", then may proceed with Cephalosporin use.    Red Dye #40 (Allura Red) Hives and Shortness Of Breath   Shellfish Allergy Anaphylaxis   Sulfa Antibiotics Hives and Shortness Of Breath   Amoxicillin-Pot Clavulanate Nausea And Vomiting   Aspirin Other (See Comments)    Blisters on tongue from higher doses - tolerates 81 mg    Dye Fdc Red  [Red Dye #2 (Amaranth)] Hives    X ray DYE  (BENADRYL 50 MG PROPHYLAXIS AND AFTER AND  SHE DID FINE, PER PATIENT.).   Tape Dermatitis and Rash    Adhesive    Zolpidem Other (See Comments)    unknown   Talwin [Pentazocine] Other (See Comments)    Unknown reaction   Levofloxacin Rash and Other (See Comments)    headache   Seroquel [Quetiapine Fumarate] Other (See Comments)    hallucinate   Prior to Admission medications    Medication Sig Start Date End Date Taking? Authorizing Provider  acyclovir ointment (ZOVIRAX) 5 % Apply 1 application topically every 3 (three) hours as needed (mouth sores).  01/27/18   [provider]  albuterol (VENTOLIN HFA) 108 (90 Base) MCG/ACT inhaler Inhale 2 puffs into the lungs every 4 (four) hours as needed for wheezing. 06/30/17   [provider]  amLODipine (NORVASC) 10 MG tablet Take 10 mg by  mouth daily.  07/05/17   [provider]  atorvastatin (LIPITOR) 20 MG tablet Take 20 mg by mouth daily. 10/25/22   [provider]  bisacodyl (DULCOLAX) 10 MG suppository Place 1 suppository (10 mg total) rectally daily as needed for moderate constipation. 08/09/18   Joseph Art, DO  chlorhexidine (PERIDEX) 0.12 % solution 15 mLs by Mouth Rinse route as needed. 10/27/21   [provider]  DULoxetine (CYMBALTA) 60 MG capsule Take 60 mg by mouth 2 (two) times daily.    [provider]  EPINEPHrine 0.3 mg/0.3 mL IJ SOAJ injection Inject into the muscle. 08/31/22   [provider]  gabapentin (NEURONTIN) 600 MG tablet Take 600 mg by mouth 3 (three) times daily.    [provider]  hydrOXYzine (ATARAX) 50 MG tablet Take 50 mg by mouth 2 (two) times daily as needed.    [provider]  Immune Globulin 10% (GAMMAGARD) 10 GM/100ML SOLN Inject into the vein every 30 (thirty) days.    [provider]  ipratropium-albuterol (DUONEB) 0.5-2.5 (3) MG/3ML SOLN Inhale 3 mLs into the lungs every 4 (four) hours as needed for wheezing or shortness of breath. 04/15/17   [provider]  ketoconazole (NIZORAL) 2 % cream Apply 1 application. topically as needed. 01/25/22   [provider]  lamoTRIgine (LAMICTAL) 25 MG tablet Take 25 mg by mouth at bedtime.    [provider]  LINZESS 145 MCG CAPS capsule Take 145 mcg by mouth daily. 03/11/22   [provider]  morphine (MS CONTIN) 30 MG 12 hr  tablet Take 1 tablet (30 mg total) by mouth every 12 (twelve) hours. 04/28/17   Narda Bonds, MD  NYSTATIN powder Apply 1 application topically 3 (three) times daily as needed (blistering sores under breast).  05/23/17   [provider]  oxyCODONE-acetaminophen (PERCOCET) 10-325 MG tablet Take 1 tablet by mouth every 6 (six) hours as needed for pain. Hold for sedation 08/09/18   Marlin Canary U, DO  prazosin (MINIPRESS) 2 MG capsule Take 2 mg by mouth at bedtime.  05/03/18   [provider]  promethazine (PHENERGAN) 25 MG tablet Take 25 mg by mouth every 8 (eight) hours as needed. 03/11/22   [provider]  QUEtiapine (SEROQUEL) 25 MG tablet Take 75 mg by mouth at bedtime. 08/06/18   [provider]  tiZANidine (ZANAFLEX) 4 MG tablet Take 8 mg by mouth 3 (three) times daily. 03/15/22   [provider]  TRELEGY ELLIPTA 100-62.5-25 MCG/ACT AEPB Inhale 1 puff into the lungs daily. 03/22/22   [provider]  amphetamine-dextroamphetamine (ADDERALL) 30 MG tablet Take 30 mg by mouth 2 (two) times daily.  10/02/15  [provider]  FLUoxetine (PROZAC) 40 MG capsule Take 1 capsule (40 mg total) by mouth daily. 10/02/15 01/10/17  Thresa Ross, MD     Positive ROS: All other systems have been reviewed and were otherwise negative with the exception of those mentioned in the HPI and as above.  Physical Exam: General: Alert, no acute distress Cardiovascular: No pedal edema Respiratory: No cyanosis, no use of accessory musculature GI: No organomegaly, abdomen is soft and non-tender Skin: No lesions in the area of chief complaint Neurologic: Sensation intact distally Psychiatric: Patient is competent for consent with normal mood and affect Lymphatic: No axillary or cervical lymphadenopathy  MUSCULOSKELETAL: TTP left clavicle, obvious deformity, shoulder ROM limited, NVI   Imaging: xrays show an acute, 1 shaft width displaced fracture through  the middle third of the shaft of the left clavicle. The lateral fracture fragment is displaced inferiorly   Assessment: FRACTURE, LEFT CLAVICLE  Plan: Plan for Procedure(s): OPEN REDUCTION INTERNAL FIXATION (ORIF) CLAVICULAR FRACTURE  The risks benefits and alternatives were discussed with the patient including but not limited to the risks of nonoperative treatment, versus surgical intervention including infection, bleeding, nerve injury,  blood clots, cardiopulmonary complications, morbidity, mortality, among others, and they were willing to proceed.   Weightbearing: NWB LUE Orthopedic devices: sling Showering: POD 3 Dressing: reinforce PRN Medicines: already on Morphine 30mg  bid & Perc 10-325mg  qid; Tylenol, Mobic, Baclofen, Zofran  Discharge: home Follow up: 03/22/24 at 11:15am    Jenne Pane, PA-C Office [phone] 03/07/2024 4:25 PM

## 2024-03-07 NOTE — Progress Notes (Signed)
 Preop nurse attempted to get in touch with pt again on 03/07/24 at 345pm.  LVMM .  Notified Kelly at [facility] office and told her I was unable to get in touch with pt and even called her mother.  Her mother gave  me the same number of [phone] that I had.  Kelly informed preop that pt had to be moved from [facility] due to she was talking incoherently on the phone with them on 03/01/24.  Tresa Endo stated" PT is on narcotics" .  Tresa Endo also stated that pt already has preop instructons  for surgery at [facility].  Chart given to Altamese Larimore, RN , Charge Nurse as a Same Day.

## 2024-03-08 ENCOUNTER — Encounter (HOSPITAL_COMMUNITY): Payer: Self-pay | Admitting: Orthopedic Surgery

## 2024-03-08 ENCOUNTER — Other Ambulatory Visit: Payer: Self-pay

## 2024-03-08 NOTE — Progress Notes (Signed)
 Anesthesia Chart Review   Case: [id number] Date/Time: 03/12/24 1223   Procedure: OPEN REDUCTION INTERNAL FIXATION (ORIF) CLAVICULAR FRACTURE (Left)   Anesthesia type: Choice   Pre-op diagnosis: FRACTURE, LEFT CLAVICLE   Location: [facility] ROOM 07 / [facility]   Surgeons: Sheral Apley, MD       DISCUSSION:63 y.o. former smoker with h/o HTN, OSA, COPD, CKD Stage II, left clavicle fracture scheduled for above procedure 03/12/2024 with Dr. Margarita Rana.   Pt follows with pulmonology, last seen 12/27/2023. Per OV note pt with dyspnea and chest pain with exertion, had previously bee referred to cardio, but never followed up.  Cardio referral made again at this visit.   Discussed with Dr. Salvadore Farber.  Ok to proceed.  EKG unchanged.  VS: Ht 5\' 5"  (1.651 m)   Wt 99.8 kg   BMI 36.61 kg/m   PROVIDERS: Eartha Inch, MD is PCP    LABS: Labs reviewed: Acceptable for surgery. (all labs ordered are listed, but only abnormal results are displayed)  Labs Reviewed - No data to display   IMAGES:   EKG:   CV:  Past Medical History:  Diagnosis Date   Anxiety    Arthritis    Asthma    Chronic kidney disease    stage 2   COPD (chronic obstructive pulmonary disease) (HCC)    Depression    Dyspnea    On 3L oxygen when laying down   Fatty liver 07/23/2018   Fibromyalgia    GERD (gastroesophageal reflux disease)    Headache    HSV infection    Hypertension    Manic depression (HCC)    Neuropathy    hands   OSA (obstructive sleep apnea)    does not use CPAP   Pneumonia    "couple of times"    Past Surgical History:  Procedure Laterality Date   ABDOMINAL SURGERY     APPENDECTOMY     BACK SURGERY     BREAST REDUCTION SURGERY     with lift   CARPAL TUNNEL RELEASE Bilateral    CESAREAN SECTION     CHOLECYSTECTOMY     COLONOSCOPY WITH PROPOFOL     FLEXOR TENOTOMY  Right 04/07/2022   Procedure: BRACHIORADIALIS RELEASE;  Surgeon: Betha Loa, MD;  Location: [facility];  Service:  Orthopedics;  Laterality: Right;   HERNIA REPAIR     JOINT REPLACEMENT Left    knee   NECK SURGERY     with rod   OPEN REDUCTION INTERNAL FIXATION (ORIF) DISTAL RADIAL FRACTURE Right 04/07/2022   Procedure: OPEN REDUCTION INTERNAL FIXATION (ORIF) RIGHT DISTAL RADIUS FRACTURE;  Surgeon: Betha Loa, MD;  Location: [facility];  Service: Orthopedics;  Laterality: Right;  75 MIN   TIBIA IM NAIL INSERTION Right 11/12/2022   Procedure: INTRAMEDULLARY (IM) NAIL TIBIAL;  Surgeon: Netta Cedars, MD;  Location: [facility];  Service: Orthopedics;  Laterality: Right;   TONSILLECTOMY     TOOTH EXTRACTION     with sedation   UPPER GI ENDOSCOPY      MEDICATIONS: No current facility-administered medications for this encounter.    acyclovir ointment (ZOVIRAX) 5 %   albuterol (VENTOLIN HFA) 108 (90 Base) MCG/ACT inhaler   amLODipine (NORVASC) 10 MG tablet   atorvastatin (LIPITOR) 20 MG tablet   bisacodyl (DULCOLAX) 10 MG suppository   chlorhexidine (PERIDEX) 0.12 % solution   DULoxetine (CYMBALTA) 60 MG capsule   EPINEPHrine 0.3 mg/0.3 mL IJ SOAJ injection   gabapentin (NEURONTIN) 600 MG  tablet   hydrOXYzine (ATARAX) 50 MG tablet   Immune Globulin 10% (GAMMAGARD) 10 GM/100ML SOLN   ipratropium-albuterol (DUONEB) 0.5-2.5 (3) MG/3ML SOLN   ketoconazole (NIZORAL) 2 % cream   lamoTRIgine (LAMICTAL) 25 MG tablet   LINZESS 145 MCG CAPS capsule   morphine (MS CONTIN) 30 MG 12 hr tablet   NYSTATIN powder   oxyCODONE-acetaminophen (PERCOCET) 10-325 MG tablet   prazosin (MINIPRESS) 2 MG capsule   promethazine (PHENERGAN) 25 MG tablet   QUEtiapine (SEROQUEL) 25 MG tablet   tiZANidine (ZANAFLEX) 4 MG tablet   TRELEGY ELLIPTA 100-62.5-25 MCG/ACT AEPB    Aniruddh Ciavarella Ward, PA-C [facility] Pre-Surgical Testing [phone]

## 2024-03-08 NOTE — Patient Instructions (Signed)
 SURGICAL WAITING ROOM VISITATION Patients having surgery or a procedure may have no more than 2 support people in the waiting area - these visitors may rotate.    Children under the age of 59 must have an adult with them who is not the patient.  If the patient needs to stay at the hospital during part of their recovery, the visitor guidelines for inpatient rooms apply. Pre-op nurse will coordinate an appropriate time for 1 support person to accompany patient in pre-op.  This support person may not rotate.    Please refer to the [facility] website for the visitor guidelines for Inpatients (after your surgery is over and you are in a regular room).       Your procedure is scheduled on: 03-12-24   Report to [facility] Main Entrance    Report to admitting at 10:30 AM   Call this number if you have problems the morning of surgery [phone]   Do not eat food :After Midnight.   After Midnight you may have the following liquids until 9:30 AM DAY OF SURGERY  Water Non-Citrus Juices (without pulp, NO RED-Apple, White grape, White cranberry) Black Coffee (NO MILK/CREAM OR CREAMERS, sugar ok)  Clear Tea (NO MILK/CREAM OR CREAMERS, sugar ok) regular and decaf                             Plain Jell-O (NO RED)                                           Fruit ices (not with fruit pulp, NO RED)                                     Popsicles (NO RED)                                                               Sports drinks like Gatorade (NO RED)                   The day of surgery:  Drink ONE (1) Pre-Surgery Clear Ensure by 9:30 AM the morning of surgery. Drink in one sitting. Do not sip.  This drink was given to you during your hospital  pre-op appointment visit. Nothing else to drink after completing the Pre-Surgery Clear Ensure          If you have questions, please contact your surgeon's office.   FOLLOW  ANY ADDITIONAL PRE OP INSTRUCTIONS YOU RECEIVED FROM YOUR SURGEON'S  OFFICE!!!     Oral Hygiene is also important to reduce your risk of infection.                                    Remember - BRUSH YOUR TEETH THE MORNING OF SURGERY WITH YOUR REGULAR TOOTHPASTE   Do NOT smoke after Midnight   Take these medicines the morning of surgery with A SIP OF WATER:     Amlodipine  Cymbalta   MS Contin   Okay to use inhalers and bring day of surgery  Stop all vitamins and herbal supplements 7 days before surgery                              You may not have any metal on your body including hair pins, jewelry, and body piercing             Do not wear make-up, lotions, powders, perfumes or deodorant  Do not wear nail polish including gel and S&S, artificial/acrylic nails, or any other type of covering on natural nails including finger and toenails. If you have artificial nails, gel coating, etc. that needs to be removed by a nail salon please have this removed prior to surgery or surgery may need to be canceled/ delayed if the surgeon/ anesthesia feels like they are unable to be safely monitored.   Do not shave  48 hours prior to surgery.     Do not bring valuables to the hospital. [facility] IS NOT RESPONSIBLE   FOR VALUABLES.   Contacts, dentures or bridgework may not be worn into surgery.   DO NOT BRING YOUR HOME MEDICATIONS TO THE HOSPITAL. PHARMACY WILL DISPENSE MEDICATIONS LISTED ON YOUR MEDICATION LIST TO YOU DURING YOUR ADMISSION IN THE HOSPITAL!    Patients discharged on the day of surgery will not be allowed to drive home.  Someone NEEDS to stay with you for the first 24 hours after anesthesia.   Special Instructions: Bring a copy of your healthcare power of attorney and living will documents the day of surgery if you haven't scanned them before.              Please read over the following fact sheets you were given: IF YOU HAVE QUESTIONS ABOUT YOUR PRE-OP INSTRUCTIONS PLEASE CALL [phone]  If you received a COVID test during your pre-op  visit  it is requested that you wear a mask when out in public, stay away from anyone that may not be feeling well and notify your surgeon if you develop symptoms. If you test positive for Covid or have been in contact with anyone that has tested positive in the last 10 days please notify you surgeon.  [facility] - Preparing for Surgery Before surgery, you can play an important role.  Because skin is not sterile, your skin needs to be as free of germs as possible.  You can reduce the number of germs on your skin by washing with CHG (chlorahexidine gluconate) soap before surgery.  CHG is an antiseptic cleaner which kills germs and bonds with the skin to continue killing germs even after washing. Please DO NOT use if you have an allergy to CHG or antibacterial soaps.  If your skin becomes reddened/irritated stop using the CHG and inform your nurse when you arrive at Short Stay. Do not shave (including legs and underarms) for at least 48 hours prior to the first CHG shower.  You may shave your face/neck.  Please follow these instructions carefully:  1.  Shower with CHG Soap the night before surgery and the  morning of surgery.  2.  If you choose to wash your hair, wash your hair first as usual with your normal  shampoo.  3.  After you shampoo, rinse your hair and body thoroughly to remove the shampoo.  4.  Use CHG as you would any other liquid soap.  You can apply chg directly to the skin and wash.  Gently with a scrungie or clean washcloth.  5.  Apply the CHG Soap to your body ONLY FROM THE NECK DOWN.   Do   not use on face/ open                           Wound or open sores. Avoid contact with eyes, ears mouth and   genitals (private parts).                       Wash face,  Genitals (private parts) with your normal soap.             6.  Wash thoroughly, paying special attention to the area where your    surgery  will be performed.  7.  Thoroughly rinse your body with warm  water from the neck down.  8.  DO NOT shower/wash with your normal soap after using and rinsing off the CHG Soap.                9.  Pat yourself dry with a clean towel.            10.  Wear clean pajamas.            11.  Place clean sheets on your bed the night of your first shower and do not  sleep with pets. Day of Surgery : Do not apply any lotions/deodorants the morning of surgery.  Please wear clean clothes to the hospital/surgery center.  FAILURE TO FOLLOW THESE INSTRUCTIONS MAY RESULT IN THE CANCELLATION OF YOUR SURGERY  PATIENT SIGNATURE_________________________________  NURSE SIGNATURE__________________________________  ________________________________________________________________________

## 2024-03-08 NOTE — Progress Notes (Signed)
 COVID Vaccine Completed:  Date of COVID positive in last 90 days:  No  PCP - Antony Haste, MD Cardiologist - N/A Pulmonologist - Roxana Hires, PA  Chest x-ray -  CT chest 01-06-24 [facility] EKG - 02-23-24 Epic Stress Test - N/A ECHO - N/A Cardiac Cath - N/A Pacemaker/ICD device last checked: Spinal Cord Stimulator:N/A  Bowel Prep - N/A  Sleep Study - Yes, +sleep apnea CPAP - No  Fasting Blood Sugar - N/A Checks Blood Sugar _____ times a day  Last dose of GLP1 agonist-  N/A GLP1 instructions:  Hold 7 days before surgery    Last dose of SGLT-2 inhibitors-  N/A SGLT-2 instructions:  Hold 3 days before surgery   Blood Thinner Instructions: N/A Aspirin Instructions: Last Dose:  Activity level:  Can go up a flight of stairs and perform activities of daily living without stopping and without symptoms of chest pain.  Patient states that she has SOB with exertion, this has not worsened and is treated with inhalers.    Anesthesia review:   COPD with SOB with exertion, uses inhalers daily.  HTN, OSA  Cardiology referral placed in January but patient has not seen cardiology.    Patient denies shortness of breath, fever, cough and chest pain at PAT appointment (completed over the phone)  Patient verbalized understanding of instructions that were given to them at the PAT appointment. Patient was also instructed that they will need to review over the PAT instructions again at home before surgery.

## 2024-03-11 NOTE — Progress Notes (Signed)
 828  Spoke with Deanna Yang  she was aware of the surgery time change to 1115 and to arrive at 0845  Asked to have clear liquids until 0815 to include drinking the pre-surgical drink  No solid food after midnight and take her medications by 0815. She was able to repeat the new instructions without questions.

## 2024-03-12 ENCOUNTER — Ambulatory Visit (HOSPITAL_COMMUNITY): Payer: Self-pay | Admitting: Physician Assistant

## 2024-03-12 ENCOUNTER — Ambulatory Visit (HOSPITAL_BASED_OUTPATIENT_CLINIC_OR_DEPARTMENT_OTHER): Payer: Self-pay | Admitting: Physician Assistant

## 2024-03-12 ENCOUNTER — Encounter (HOSPITAL_COMMUNITY): Payer: Self-pay | Admitting: Orthopedic Surgery

## 2024-03-12 ENCOUNTER — Other Ambulatory Visit: Payer: Self-pay

## 2024-03-12 ENCOUNTER — Encounter (HOSPITAL_COMMUNITY)
Admission: RE | Disposition: A | Discharge status: Home / Self Care | Payer: Self-pay | Source: Home / Self Care | Attending: Orthopedic Surgery

## 2024-03-12 ENCOUNTER — Ambulatory Visit (HOSPITAL_COMMUNITY)
Admission: RE | Admit: 2024-03-12 | Discharge: 2024-03-12 | Disposition: A | Discharge status: Home / Self Care | Attending: Orthopedic Surgery | Admitting: Orthopedic Surgery

## 2024-03-12 DIAGNOSIS — Z79899 Other long term (current) drug therapy: Secondary | ICD-10-CM | POA: Diagnosis not present

## 2024-03-12 DIAGNOSIS — F319 Bipolar disorder, unspecified: Secondary | ICD-10-CM | POA: Insufficient documentation

## 2024-03-12 DIAGNOSIS — J4489 Other specified chronic obstructive pulmonary disease: Secondary | ICD-10-CM | POA: Insufficient documentation

## 2024-03-12 DIAGNOSIS — I129 Hypertensive chronic kidney disease with stage 1 through stage 4 chronic kidney disease, or unspecified chronic kidney disease: Secondary | ICD-10-CM | POA: Diagnosis not present

## 2024-03-12 DIAGNOSIS — Z9981 Dependence on supplemental oxygen: Secondary | ICD-10-CM | POA: Diagnosis not present

## 2024-03-12 DIAGNOSIS — M797 Fibromyalgia: Secondary | ICD-10-CM | POA: Diagnosis not present

## 2024-03-12 DIAGNOSIS — J449 Chronic obstructive pulmonary disease, unspecified: Secondary | ICD-10-CM | POA: Diagnosis not present

## 2024-03-12 DIAGNOSIS — S42022A Displaced fracture of shaft of left clavicle, initial encounter for closed fracture: Secondary | ICD-10-CM | POA: Diagnosis not present

## 2024-03-12 DIAGNOSIS — G4733 Obstructive sleep apnea (adult) (pediatric): Secondary | ICD-10-CM | POA: Diagnosis not present

## 2024-03-12 DIAGNOSIS — N182 Chronic kidney disease, stage 2 (mild): Secondary | ICD-10-CM | POA: Diagnosis not present

## 2024-03-12 DIAGNOSIS — K219 Gastro-esophageal reflux disease without esophagitis: Secondary | ICD-10-CM | POA: Diagnosis not present

## 2024-03-12 DIAGNOSIS — S42002A Fracture of unspecified part of left clavicle, initial encounter for closed fracture: Secondary | ICD-10-CM | POA: Diagnosis not present

## 2024-03-12 DIAGNOSIS — F419 Anxiety disorder, unspecified: Secondary | ICD-10-CM | POA: Diagnosis not present

## 2024-03-12 DIAGNOSIS — I10 Essential (primary) hypertension: Secondary | ICD-10-CM | POA: Diagnosis not present

## 2024-03-12 DIAGNOSIS — Z87891 Personal history of nicotine dependence: Secondary | ICD-10-CM

## 2024-03-12 DIAGNOSIS — X58XXXA Exposure to other specified factors, initial encounter: Secondary | ICD-10-CM | POA: Insufficient documentation

## 2024-03-12 DIAGNOSIS — N1831 Chronic kidney disease, stage 3a: Secondary | ICD-10-CM | POA: Diagnosis not present

## 2024-03-12 HISTORY — PX: ORIF CLAVICULAR FRACTURE: SHX5055

## 2024-03-12 HISTORY — DX: Unspecified asthma, uncomplicated: J45.909

## 2024-03-12 SURGERY — OPEN REDUCTION INTERNAL FIXATION (ORIF) CLAVICULAR FRACTURE
Anesthesia: General | Laterality: Left

## 2024-03-12 MED ORDER — SUGAMMADEX SODIUM 200 MG/2ML IV SOLN
INTRAVENOUS | Status: DC | PRN
Start: 2024-03-12 — End: 2024-03-12
  Administered 2024-03-12: 300 mg via INTRAVENOUS

## 2024-03-12 MED ORDER — ACETAMINOPHEN 500 MG PO TABS
1000.0000 mg | ORAL_TABLET | Freq: Once | ORAL | Status: DC
  Filled 2024-03-12: qty 2

## 2024-03-12 MED ORDER — FENTANYL CITRATE (PF) 100 MCG/2ML IJ SOLN
INTRAMUSCULAR | Status: AC
  Filled 2024-03-12: qty 2

## 2024-03-12 MED ORDER — ROCURONIUM BROMIDE 100 MG/10ML IV SOLN
INTRAVENOUS | Status: DC | PRN
  Administered 2024-03-12: 50 mg via INTRAVENOUS

## 2024-03-12 MED ORDER — METHOCARBAMOL 1000 MG/10ML IJ SOLN: 500.0000 mg | Freq: Four times a day (QID) | INTRAMUSCULAR | Status: DC | PRN

## 2024-03-12 MED ORDER — BUPIVACAINE-EPINEPHRINE 0.25% -1:200000 IJ SOLN
INTRAMUSCULAR | Status: DC | PRN
  Administered 2024-03-12: 20 mL

## 2024-03-12 MED ORDER — MIDAZOLAM HCL 5 MG/5ML IJ SOLN
INTRAMUSCULAR | Status: DC | PRN
  Administered 2024-03-12: 2 mg via INTRAVENOUS

## 2024-03-12 MED ORDER — PHENYLEPHRINE HCL-NACL 20-0.9 MG/250ML-% IV SOLN
INTRAVENOUS | Status: DC | PRN
  Administered 2024-03-12: 25 ug/min via INTRAVENOUS

## 2024-03-12 MED ORDER — METHOCARBAMOL 500 MG PO TABS: 500.0000 mg | ORAL_TABLET | Freq: Four times a day (QID) | ORAL | Status: DC | PRN

## 2024-03-12 MED ORDER — HYDROMORPHONE HCL 1 MG/ML IJ SOLN
INTRAMUSCULAR | Status: DC | PRN
  Administered 2024-03-12 (×2): .5 mg via INTRAVENOUS

## 2024-03-12 MED ORDER — HYDROMORPHONE HCL 1 MG/ML IJ SOLN: 0.2500 mg | INTRAMUSCULAR | Status: DC | PRN

## 2024-03-12 MED ORDER — KETAMINE HCL 50 MG/5ML IJ SOSY
PREFILLED_SYRINGE | INTRAMUSCULAR | Status: AC
  Filled 2024-03-12: qty 5

## 2024-03-12 MED ORDER — LIDOCAINE HCL (CARDIAC) PF 100 MG/5ML IV SOSY
PREFILLED_SYRINGE | INTRAVENOUS | Status: DC | PRN
  Administered 2024-03-12: 60 mg via INTRAVENOUS

## 2024-03-12 MED ORDER — PROPOFOL 10 MG/ML IV BOLUS
INTRAVENOUS | Status: DC | PRN
  Administered 2024-03-12: 150 mg via INTRAVENOUS

## 2024-03-12 MED ORDER — DEXAMETHASONE SODIUM PHOSPHATE 10 MG/ML IJ SOLN
INTRAMUSCULAR | Status: DC | PRN
  Administered 2024-03-12: 10 mg via INTRAVENOUS

## 2024-03-12 MED ORDER — ACETAMINOPHEN 500 MG PO TABS: 500.0000 mg | ORAL_TABLET | Freq: Four times a day (QID) | ORAL | 0 refills | Status: DC | PRN

## 2024-03-12 MED ORDER — DEXAMETHASONE SODIUM PHOSPHATE 10 MG/ML IJ SOLN
INTRAMUSCULAR | Status: AC
  Filled 2024-03-12: qty 1

## 2024-03-12 MED ORDER — PHENYLEPHRINE HCL (PRESSORS) 10 MG/ML IV SOLN
INTRAVENOUS | Status: DC | PRN
  Administered 2024-03-12: 80 ug via INTRAVENOUS
  Administered 2024-03-12 (×2): 160 ug via INTRAVENOUS

## 2024-03-12 MED ORDER — LIDOCAINE HCL (PF) 2 % IJ SOLN
INTRAMUSCULAR | Status: AC
Start: 2024-03-12 — End: ?
  Filled 2024-03-12: qty 5

## 2024-03-12 MED ORDER — LACTATED RINGERS IV SOLN: INTRAVENOUS | Status: DC

## 2024-03-12 MED ORDER — OXYCODONE HCL 5 MG/5ML PO SOLN: 5.0000 mg | Freq: Once | ORAL | Status: DC | PRN

## 2024-03-12 MED ORDER — MIDAZOLAM HCL 2 MG/2ML IJ SOLN
INTRAMUSCULAR | Status: AC
  Filled 2024-03-12: qty 2

## 2024-03-12 MED ORDER — FENTANYL CITRATE (PF) 100 MCG/2ML IJ SOLN
INTRAMUSCULAR | Status: DC | PRN
  Administered 2024-03-12 (×2): 50 ug via INTRAVENOUS

## 2024-03-12 MED ORDER — ROCURONIUM BROMIDE 10 MG/ML (PF) SYRINGE
PREFILLED_SYRINGE | INTRAVENOUS | Status: AC
  Filled 2024-03-12: qty 10

## 2024-03-12 MED ORDER — PROPOFOL 10 MG/ML IV BOLUS
INTRAVENOUS | Status: AC
  Filled 2024-03-12: qty 20

## 2024-03-12 MED ORDER — ONDANSETRON HCL 4 MG/2ML IJ SOLN
INTRAMUSCULAR | Status: AC
  Filled 2024-03-12: qty 2

## 2024-03-12 MED ORDER — TRAMADOL HCL 50 MG PO TABS
50.0000 mg | ORAL_TABLET | Freq: Four times a day (QID) | ORAL | 0 refills | Status: DC | PRN
Start: 2024-03-12 — End: 2024-04-09

## 2024-03-12 MED ORDER — AMISULPRIDE (ANTIEMETIC) 5 MG/2ML IV SOLN: 10.0000 mg | Freq: Once | INTRAVENOUS | Status: DC | PRN

## 2024-03-12 MED ORDER — ONDANSETRON HCL 4 MG/2ML IJ SOLN
INTRAMUSCULAR | Status: DC | PRN
  Administered 2024-03-12: 4 mg via INTRAVENOUS

## 2024-03-12 MED ORDER — 0.9 % SODIUM CHLORIDE (POUR BTL) OPTIME
TOPICAL | Status: DC | PRN
  Administered 2024-03-12: 1000 mL

## 2024-03-12 MED ORDER — POVIDONE-IODINE 10 % EX SWAB: 2.0000 | Freq: Once | CUTANEOUS | Status: DC

## 2024-03-12 MED ORDER — MELOXICAM 15 MG PO TABS: 15.0000 mg | ORAL_TABLET | Freq: Every day | ORAL | 0 refills | Status: DC | PRN

## 2024-03-12 MED ORDER — HYDROMORPHONE HCL 2 MG/ML IJ SOLN
INTRAMUSCULAR | Status: AC
  Filled 2024-03-12: qty 1

## 2024-03-12 MED ORDER — OXYCODONE HCL 5 MG PO TABS: 5.0000 mg | ORAL_TABLET | Freq: Once | ORAL | Status: DC | PRN

## 2024-03-12 MED ORDER — CEFAZOLIN SODIUM-DEXTROSE 2-4 GM/100ML-% IV SOLN
2.0000 g | INTRAVENOUS | Status: AC
  Administered 2024-03-12: 2 g via INTRAVENOUS
  Filled 2024-03-12: qty 100

## 2024-03-12 MED ORDER — BUPIVACAINE-EPINEPHRINE (PF) 0.25% -1:200000 IJ SOLN
INTRAMUSCULAR | Status: AC
  Filled 2024-03-12: qty 30

## 2024-03-12 MED ORDER — KETAMINE HCL 10 MG/ML IJ SOLN
INTRAMUSCULAR | Status: DC | PRN
  Administered 2024-03-12: 20 mg via INTRAVENOUS

## 2024-03-12 MED ORDER — ACETAMINOPHEN 500 MG PO TABS
1000.0000 mg | ORAL_TABLET | Freq: Once | ORAL | Status: AC
  Administered 2024-03-12: 1000 mg via ORAL

## 2024-03-12 SURGICAL SUPPLY — 40 items
BAG COUNTER SPONGE SURGICOUNT (BAG) ×1 IMPLANT
BIT DRILL LONG 2.6X220 (BIT) IMPLANT
CLEANER TIP ELECTROSURG 2X2 (MISCELLANEOUS) ×1 IMPLANT
CLSR STERI-STRIP ANTIMIC 1/2X4 (GAUZE/BANDAGES/DRESSINGS) IMPLANT
DRAPE C-ARM 42X120 X-RAY (DRAPES) ×1 IMPLANT
DRAPE IMP U-DRAPE 54X76 (DRAPES) ×1 IMPLANT
DRAPE SURG ORHT 6 SPLT 77X108 (DRAPES) ×2 IMPLANT
DRAPE U-SHAPE 47X51 STRL (DRAPES) ×2 IMPLANT
DRSG EMULSION OIL 3X3 NADH (GAUZE/BANDAGES/DRESSINGS) ×1 IMPLANT
DRSG MEPILEX POST OP 4X8 (GAUZE/BANDAGES/DRESSINGS) ×1 IMPLANT
DURAPREP 26ML APPLICATOR (WOUND CARE) ×1 IMPLANT
ELECT REM PT RETURN 15FT ADLT (MISCELLANEOUS) ×1 IMPLANT
GAUZE SPONGE 4X4 12PLY STRL (GAUZE/BANDAGES/DRESSINGS) ×1 IMPLANT
GLOVE BIO SURGEON STRL SZ7.5 (GLOVE) ×2 IMPLANT
GLOVE BIOGEL PI IND STRL 7.5 (GLOVE) ×1 IMPLANT
GLOVE BIOGEL PI IND STRL 8 (GLOVE) ×1 IMPLANT
GOWN STRL REUS W/ TWL LRG LVL3 (GOWN DISPOSABLE) ×3 IMPLANT
KIT BASIN OR (CUSTOM PROCEDURE TRAY) ×1 IMPLANT
KIT TURNOVER KIT A (KITS) IMPLANT
MANIFOLD NEPTUNE II (INSTRUMENTS) ×1 IMPLANT
NDL HYPO 25X1 1.5 SAFETY (NEEDLE) ×1 IMPLANT
NEEDLE HYPO 25X1 1.5 SAFETY (NEEDLE) ×1 IMPLANT
NS IRRIG 1000ML POUR BTL (IV SOLUTION) ×1 IMPLANT
PACK SHOULDER (CUSTOM PROCEDURE TRAY) ×1 IMPLANT
PAD ARMBOARD POSITIONER FOAM (MISCELLANEOUS) ×2 IMPLANT
PLATE CVD MIDSHAFT 6H LT (Plate) IMPLANT
SCREW BN T10 FT 14X3.5XSTRDR (Screw) IMPLANT
SCREW BONE 3.5X16MM (Screw) IMPLANT
SCREW LOCK HEX T10 3.5X12 (Screw) IMPLANT
SLING ARM FOAM STRAP LRG (SOFTGOODS) ×1 IMPLANT
SPONGE T-LAP 4X18 ~~LOC~~+RFID (SPONGE) ×2 IMPLANT
STRIP CLOSURE SKIN 1/2X4 (GAUZE/BANDAGES/DRESSINGS) ×2 IMPLANT
SUCTION TUBE FRAZIER 10FR DISP (SUCTIONS) ×1 IMPLANT
SUCTION TUBE FRAZIER 12FR DISP (SUCTIONS) IMPLANT
SUT MNCRL AB 4-0 PS2 18 (SUTURE) ×1 IMPLANT
SUT MON AB 2-0 CT1 36 (SUTURE) ×1 IMPLANT
SUT VIC AB 0 CT1 36 (SUTURE) IMPLANT
SYR CONTROL 10ML LL (SYRINGE) ×1 IMPLANT
TOWEL OR 17X26 10 PK STRL BLUE (TOWEL DISPOSABLE) ×1 IMPLANT
WATER STERILE IRR 1000ML POUR (IV SOLUTION) ×1 IMPLANT

## 2024-03-12 NOTE — Discharge Instructions (Addendum)

## 2024-03-12 NOTE — Anesthesia Preprocedure Evaluation (Addendum)
 Anesthesia Evaluation  Patient identified by MRN, date of birth, ID band Patient awake    Reviewed: Allergy & Precautions, NPO status , Patient's Chart, lab work & pertinent test results  Airway Mallampati: IV  TM Distance: >3 FB Neck ROM: Full    Dental  (+) Teeth Intact, Dental Advisory Given   Pulmonary shortness of breath and Long-Term Oxygen Therapy, asthma , sleep apnea (noncompliant w/ cpap, unable to tolerate) , COPD (3lpm [place] qhs),  oxygen dependent, former smoker   Pulmonary exam normal breath sounds clear to auscultation       Cardiovascular hypertension (115/78 preop), Pt. on medications Normal cardiovascular exam Rhythm:Regular Rate:Normal     Neuro/Psych  Headaches PSYCHIATRIC DISORDERS Anxiety Depression Bipolar Disorder    Neuromuscular disease (b/l hands peripheral neuropathy)    GI/Hepatic Neg liver ROS,GERD  Controlled and Medicated,,  Endo/Other  Obesity BMI 37  Renal/GU Renal InsufficiencyRenal disease  negative genitourinary   Musculoskeletal  (+) Arthritis , Osteoarthritis,  Fibromyalgia -L mid-clavicle fx Percocet 10/325- 4x/d Morphine 30mg - BID  Chronic pain- all over   Abdominal   Peds  Hematology negative hematology ROS (+)   Anesthesia Other Findings   Reproductive/Obstetrics negative OB ROS                             Anesthesia Physical Anesthesia Plan  ASA: 4  Anesthesia Plan: General   Post-op Pain Management: Tylenol PO (pre-op)*   Induction: Intravenous  PONV Risk Score and Plan: 3 and Ondansetron, Dexamethasone, Treatment may vary due to age or medical condition and Midazolam  Airway Management Planned: Oral ETT and Video Laryngoscope Planned  Additional Equipment: None  Intra-op Plan:   Post-operative Plan: Extubation in OR  Informed Consent: I have reviewed the patients History and Physical, chart, labs and discussed the procedure including  the risks, benefits and alternatives for the proposed anesthesia with the patient or authorized representative who has indicated his/her understanding and acceptance.     Dental advisory given  Plan Discussed with: CRNA  Anesthesia Plan Comments: (D/w Dr Eulah Pont and pt- clavicle break is mid-shaft and pt has poor lung function on home O2 at bedtime, so I would prefer to avoid interscalene block to preserve diaphragm fx esp given the block may or may not cover mid-clavicle. Will pursue multimodals for analgesia pre/intra-op. Discussed rescue block in PACU if needed. Will be staying overnight.)        Anesthesia Quick Evaluation

## 2024-03-12 NOTE — Anesthesia Postprocedure Evaluation (Signed)
 Anesthesia Post Note  Patient: Deanna Yang  Procedure(s) Performed: OPEN REDUCTION INTERNAL FIXATION (ORIF) CLAVICULAR FRACTURE (Left)     Patient location during evaluation: PACU Anesthesia Type: General Level of consciousness: awake and alert, oriented and patient cooperative Pain management: pain level controlled Vital Signs Assessment: post-procedure vital signs reviewed and stable Respiratory status: spontaneous breathing, nonlabored ventilation and respiratory function stable Cardiovascular status: blood pressure returned to baseline and stable Postop Assessment: no apparent nausea or vomiting Anesthetic complications: no   No notable events documented.  Last Vitals:  Vitals:   03/12/24 1415 03/12/24 1433  BP: 117/89 115/88  Pulse: 88 94  Resp: 10 20  Temp: 36.7 C (!) 36.3 C  SpO2: 93% 94%    Last Pain:  Vitals:   03/12/24 1433  TempSrc: Temporal  PainSc: 0-No pain                 Lannie Fields

## 2024-03-12 NOTE — Evaluation (Signed)
 Occupational Therapy Evaluation Patient Details Name: Deanna Yang MRN: [id number] DOB: 02/28/1961 Today's Date: 03/12/2024   History of Present Illness   Patient is a 63 year old female who presented with left fractures clavicle. PMH: anxiety, dyspnea, GERD, fibromyalgia, neuropathy in hands, bilateral carpal tunnel release, L knee replacement, R distal radial fracture with ORIF, IM nain R tibia.     Clinical Impressions s/p clavicle replacement without functional use of LUE secondary to effects of surgery and interscalene block and shoulder precautions. Therapist provided education and instruction to patient and parents in regards to exercises, precautions, positioning, donning upper extremity clothing and bathing while maintaining shoulder precautions, ice and edema management, use of ice machine and donning/doffing sling. Patient and parents verbalized understanding and demonstrated as needed. Patient needed assistance to donn shirt, underwear, pants, socks and shoes and provided with instruction on compensatory strategies to perform ADLs. Patient to follow up with MD for further therapy needs.       If plan is discharge home, recommend the following:   A little help with bathing/dressing/bathroom;Assistance with cooking/housework;Direct supervision/assist for medications management;Assist for transportation;Help with stairs or ramp for entrance;Direct supervision/assist for financial management      Equipment Recommendations   None recommended by OT      Precautions/Restrictions   Precautions Precautions: Shoulder Type of Shoulder Precautions: NO ROM of shoulder, ok for hand wrist and elbow. Shoulder Interventions: Shoulder sling/immobilizer;Off for dressing/bathing/exercises Precaution Booklet Issued: Yes (comment) (handouts) Restrictions Weight Bearing Restrictions Per Provider Order: Yes LUE Weight Bearing Per Provider Order: Non weight bearing            ADL  either performed or assessed with clinical judgement   ADL         General ADL Comments: patient was max a for donning shirt and mod A for pants with education on wearing gowns to reduce need to manipulate pants. patient reported that she did not have any. patient's parents were present but not engage in training with them reporting they had no questions and father making jokes during session. patient was noted to have poor carryover of education with continued cues to maintain shoulder in NO ROM. patient reported that she has o2 at home that she waers 3L/min as needed. patients was able to maintain O2 from 89% to 92% during session.patient was able to walk to bathroom and complete toileting tasks with supervision and cues to keep sling still.     Vision   Vision Assessment?: No apparent visual deficits            Pertinent Vitals/Pain Pain Assessment Pain Assessment: No/denies pain     Extremity/Trunk Assessment Upper Extremity Assessment Upper Extremity Assessment: LUE deficits/detail LUE Deficits / Details: reported numbness across chest. patient with bandage across calvicular area with small dot of redness on it.   Lower Extremity Assessment Lower Extremity Assessment: Defer to PT evaluation          Cognition Arousal: Alert Behavior During Therapy: Restless Cognition: No apparent impairments             OT - Cognition Comments: poor safety awareness and impulsive at times. patients parents were both present but not engaged in training. reported that they can support PRN.                               Shoulder Instructions Shoulder Instructions Donning/doffing shirt without moving shoulder: Moderate assistance Method for sponge  bathing under operated UE: Minimal assistance Donning/doffing sling/immobilizer: Moderate assistance Correct positioning of sling/immobilizer: Moderate assistance Sling wearing schedule (on at all times/off for ADL's): Modified  independent Proper positioning of operated UE when showering: Modified independent Positioning of UE while sleeping: Modified independent    Home Living Family/patient expects to be discharged to:: Private residence Living Arrangements: Alone Available Help at Discharge: Family;Available 24 hours/day (parents) Type of Home: House          Prior Functioning/Environment Prior Level of Function : Independent/Modified Independent                            OT Goals(Current goals can be found in the care plan section)   Acute Rehab OT Goals OT Goal Formulation: All assessment and education complete, DC therapy   OT Frequency:             End of Session Equipment Utilized During Treatment: Other (comment) (sling) Nurse Communication: Other (comment) (ok to participate in session)  Activity Tolerance: Patient tolerated treatment well Patient left: in chair;with call bell/phone within reach;with family/visitor present (in PACU)  OT Visit Diagnosis: Pain Pain - Right/Left: Left Pain - part of body: Shoulder                Time: 1610-9604 OT Time Calculation (min): 24 min Charges:  OT General Charges $OT Visit: 1 Visit OT Evaluation $OT Eval Low Complexity: 1 Low OT Treatments $Self Care/Home Management : 8-22 mins  Rosalio Loud, MS Acute Rehabilitation Department Office# [phone]   Selinda Flavin 03/12/2024, 4:17 PM

## 2024-03-12 NOTE — Anesthesia Procedure Notes (Signed)
 Procedure Name: Intubation Date/Time: 03/12/2024 11:40 AM  Performed by: Evalie Hargraves C, CRNAPre-anesthesia Checklist: Patient identified, Emergency Drugs available, Suction available and Patient being monitored Patient Re-evaluated:Patient Re-evaluated prior to induction Oxygen Delivery Method: Circle system utilized Preoxygenation: Pre-oxygenation with 100% oxygen Induction Type: IV induction Ventilation: Mask ventilation without difficulty Laryngoscope Size: Glidescope and 3 Grade View: Grade I Tube type: Oral Number of attempts: 1 Airway Equipment and Method: Stylet and Oral airway Placement Confirmation: ETT inserted through vocal cords under direct vision, positive ETCO2 and breath sounds checked- equal and bilateral Secured at: 21 cm Tube secured with: Tape Dental Injury: Teeth and Oropharynx as per pre-operative assessment

## 2024-03-12 NOTE — Transfer of Care (Signed)
 Immediate Anesthesia Transfer of Care Note  Patient: Deanna Yang  Procedure(s) Performed: OPEN REDUCTION INTERNAL FIXATION (ORIF) CLAVICULAR FRACTURE (Left)  Patient Location: PACU  Anesthesia Type:General  Level of Consciousness: awake and sedated  Airway & Oxygen Therapy: Patient Spontanous Breathing and Patient connected to face mask oxygen  Post-op Assessment: Report given to RN and Post -op Vital signs reviewed and stable  Post vital signs: Reviewed and stable  Last Vitals:  Vitals Value Taken Time  BP 152/95 03/12/24 1310  Temp 37.1 C 03/12/24 1310  Pulse 88 03/12/24 1314  Resp 10 03/12/24 1314  SpO2 98 % 03/12/24 1314  Vitals shown include unfiled device data.  Last Pain:  Vitals:   03/12/24 1310  TempSrc:   PainSc: Asleep      Patients Stated Pain Goal: 5 (03/12/24 1000)  Complications: No notable events documented.

## 2024-03-12 NOTE — Interval H&P Note (Signed)
 History and Physical Interval Note:  03/12/2024 8:52 AM  Deanna Yang  has presented today for surgery, with the diagnosis of FRACTURE, LEFT CLAVICLE.  The various methods of treatment have been discussed with the patient and family. After consideration of risks, benefits and other options for treatment, the patient has consented to  Procedure(s): OPEN REDUCTION INTERNAL FIXATION (ORIF) CLAVICULAR FRACTURE (Left) as a surgical intervention.  The patient's history has been reviewed, patient examined, no change in status, stable for surgery.  I have reviewed the patient's chart and labs.  Questions were answered to the patient's satisfaction.     Sheral Apley

## 2024-03-12 NOTE — Op Note (Signed)
 03/12/2024  12:36 PM  PATIENT:  Deanna Yang    PRE-OPERATIVE DIAGNOSIS:  FRACTURE, LEFT CLAVICLE  POST-OPERATIVE DIAGNOSIS:  Same  PROCEDURE:  OPEN REDUCTION INTERNAL FIXATION (ORIF) CLAVICULAR FRACTURE  SURGEON:  Sheral Apley, MD  PHYSICIAN ASSISTANT: Levester Fresh, PA-C, he was present and scrubbed throughout the case, critical for completion in a timely fashion, and for retraction, instrumentation, and closure.   ANESTHESIA:   General  PREOPERATIVE INDICATIONS:  Deanna Yang is a  63 y.o. female with a diagnosis of FRACTURE, LEFT CLAVICLE who elected for surgical management based on preoperative shortening and angulation and displacement of the fracture.    The risks benefits and alternatives were discussed with the patient preoperatively including but not limited to the risks of infection, bleeding, nerve injury, malunion, nonunion, hardware failure, the need for hardware removal, recurrent fracture, cardiopulmonary complications, the need for revision surgery, among others, and the patient was willing to proceed.    OPERATIVE IMPLANTS: stryker 6 hole clavicle plate  OPERATIVE FINDINGS: Shortened, displaced clavicle fracture  OPERATIVE PROCEDURE: The patient was brought to the operating room and placed in the supine position. General anesthesia was administered. IV antibiotics were given. She was placed in the beach chair position. The upper extremity was prepped and draped in the usual sterile fashion. Time out was performed. Incision was made over the clavicle fracture. Dissection was carried down through the platysma, and the fracture site exposed. The fracture was extremely short.  I ultimately did however achieve satisfactory mobilization, and was able to reduce the fracture anatomically.   I placed the above mentioned plate and was happy with the reduction and fixation of all screws.   I had excellent bony apposition and restoration of anatomic alignment of the  clavicle. Used C-arm to confirm appropriate alignment, reduction of the fracture, and positioning of the plate and length of the screws.  I then took final C-arm pictures, irrigated the wounds copiously, and repaired the fascia with inverted figure-of-eight Vicryl suture. The subcutaneous tissue was closed with Vicryl as well, and the skin closed with steri-strips, and the patient was awakened and returned to the PACU in stable and satisfactory condition. There were no complications.   POSTOPERATIVE PLAN: Sling full time, DVT px: ambulation and mobilization and chemical px

## 2024-03-13 ENCOUNTER — Encounter (HOSPITAL_COMMUNITY): Payer: Self-pay | Admitting: Orthopedic Surgery

## 2024-03-14 ENCOUNTER — Other Ambulatory Visit: Payer: Self-pay

## 2024-03-15 DIAGNOSIS — G894 Chronic pain syndrome: Secondary | ICD-10-CM | POA: Diagnosis not present

## 2024-03-15 DIAGNOSIS — S82871A Displaced pilon fracture of right tibia, initial encounter for closed fracture: Secondary | ICD-10-CM | POA: Diagnosis not present

## 2024-03-15 DIAGNOSIS — S82201A Unspecified fracture of shaft of right tibia, initial encounter for closed fracture: Secondary | ICD-10-CM | POA: Diagnosis not present

## 2024-03-15 DIAGNOSIS — N1831 Chronic kidney disease, stage 3a: Secondary | ICD-10-CM | POA: Diagnosis not present

## 2024-03-19 DIAGNOSIS — G894 Chronic pain syndrome: Secondary | ICD-10-CM | POA: Diagnosis not present

## 2024-03-19 DIAGNOSIS — Z5181 Encounter for therapeutic drug level monitoring: Secondary | ICD-10-CM | POA: Diagnosis not present

## 2024-03-19 DIAGNOSIS — M5417 Radiculopathy, lumbosacral region: Secondary | ICD-10-CM | POA: Diagnosis not present

## 2024-03-19 DIAGNOSIS — Z79899 Other long term (current) drug therapy: Secondary | ICD-10-CM | POA: Diagnosis not present

## 2024-03-19 DIAGNOSIS — Z79891 Long term (current) use of opiate analgesic: Secondary | ICD-10-CM | POA: Diagnosis not present

## 2024-03-19 DIAGNOSIS — M5030 Other cervical disc degeneration, unspecified cervical region: Secondary | ICD-10-CM | POA: Diagnosis not present

## 2024-03-22 DIAGNOSIS — S42002D Fracture of unspecified part of left clavicle, subsequent encounter for fracture with routine healing: Secondary | ICD-10-CM | POA: Diagnosis not present

## 2024-03-28 DIAGNOSIS — J449 Chronic obstructive pulmonary disease, unspecified: Secondary | ICD-10-CM | POA: Diagnosis not present

## 2024-04-01 DIAGNOSIS — S42002D Fracture of unspecified part of left clavicle, subsequent encounter for fracture with routine healing: Secondary | ICD-10-CM | POA: Diagnosis not present

## 2024-04-04 NOTE — H&P (Signed)
 PREOPERATIVE H&P  Chief Complaint: failed hardware  HPI: Deanna Yang is a 63 y.o. female who presents with a diagnosis of failed hardware. Symptoms are rated as moderate to severe, and have been worsening.  This is significantly impairing activities of daily living.  She has elected for surgical management.   Past Medical History:  Diagnosis Date   Anxiety    Arthritis    Asthma    Chronic kidney disease    stage 2   COPD (chronic obstructive pulmonary disease) (HCC)    Depression    Dyspnea    On 3L oxygen  when laying down   Fatty liver 07/23/2018   Fibromyalgia    GERD (gastroesophageal reflux disease)    Headache    HSV infection    Hypertension    Manic depression (HCC)    Neuropathy    hands   OSA (obstructive sleep apnea)    does not use CPAP   Pneumonia    "couple of times"   Past Surgical History:  Procedure Laterality Date   ABDOMINAL SURGERY     APPENDECTOMY     BACK SURGERY     BREAST REDUCTION SURGERY     with lift   CARPAL TUNNEL RELEASE Bilateral    CESAREAN SECTION     CHOLECYSTECTOMY     COLONOSCOPY WITH PROPOFOL      FLEXOR TENOTOMY  Right 04/07/2022   Procedure: BRACHIORADIALIS RELEASE;  Surgeon: Brunilda Capra, MD;  [facility];  Service: Orthopedics;  Laterality: Right;   HERNIA REPAIR     JOINT REPLACEMENT Left    knee   NECK SURGERY     with rod   OPEN REDUCTION INTERNAL FIXATION (ORIF) DISTAL RADIAL FRACTURE Right 04/07/2022   Procedure: OPEN REDUCTION INTERNAL FIXATION (ORIF) RIGHT DISTAL RADIUS FRACTURE;  Surgeon: Brunilda Capra, MD;  [facility];  Service: Orthopedics;  Laterality: Right;  75 MIN   ORIF CLAVICULAR FRACTURE Left 03/12/2024   Procedure: OPEN REDUCTION INTERNAL FIXATION (ORIF) CLAVICULAR FRACTURE;  Surgeon: Saundra Curl, MD;  [facility]: [facility];  Service: Orthopedics;  Laterality: Left;   TIBIA IM NAIL INSERTION Right 11/12/2022   Procedure: INTRAMEDULLARY (IM) NAIL TIBIAL;  Surgeon: Ali Ink, MD;  [facility];  Service: Orthopedics;  Laterality: Right;   TONSILLECTOMY     TOOTH EXTRACTION     with sedation   UPPER GI ENDOSCOPY     Social History   Socioeconomic History   Marital status: Legally Separated    Spouse name: Not on file   Number of children: Not on file   Years of education: Not on file   Highest education level: Not on file  Occupational History   Not on file  Tobacco Use   Smoking status: Former    Current packs/day: 0.00    Types: Cigarettes    Quit date: 01/17/2017    Years since quitting: 7.2   Smokeless tobacco: Never  Vaping Use   Vaping status: Never Used  Substance and Sexual Activity   Alcohol use: No    Comment: last use 2-3 years ago   Drug use: No   Sexual activity: Not Currently    Birth control/protection: Post-menopausal  Other Topics Concern   Not on file  Social History Narrative   Not on file   Social Drivers of Health   Financial Resource Strain: High Risk (02/22/2023)   Received from [facility], [facility]   Overall Financial Resource Strain (CARDIA)    Difficulty  of Paying Living Expenses: Hard  Food Insecurity: Food Insecurity Present (02/22/2023)   Received from [facility], [facility]   Hunger Vital Sign    Worried About Running Out of Food in the Last Year: Sometimes true    Ran Out of Food in the Last Year: Sometimes true  Transportation Needs: No Transportation Needs (02/22/2023)   Received from [facility], [facility]   PRAPARE - Transportation    Lack of Transportation (Medical): No    Lack of Transportation (Non-Medical): No  Physical Activity: Unknown (02/22/2023)   Received from [facility], [facility]   Exercise Vital Sign    Days of Exercise per Week: 0 days    Minutes of Exercise per Session: Not on file  Stress: Stress Concern Present (02/22/2023)   Received from [facility], [facility] - Occupational Stress Questionnaire    Feeling of Stress :  Rather much  Social Connections: Socially Isolated (02/22/2023)   Received from [facility], [facility]   Social Network    How would you rate your social network (family, work, friends)?: Little participation, lonely and socially isolated   Family History  Problem Relation Age of Onset   Alcohol abuse Mother    Alcohol abuse Father    Depression Sister    Dementia Neg Hx    Allergies  Allergen Reactions   Ambien [Zolpidem Tartrate] Shortness Of Breath and Swelling    Tongue swelling, throat swelling.    Beta Adrenergic Blockers Anaphylaxis   Fish Allergy Shortness Of Breath and Rash   Iodinated Contrast Media Itching    Benadryl  50MG  prophylaxis before and after and patient reports she did fine   Latex Itching and Other (See Comments)    Blisters skin   Metoprolol  Other (See Comments)    ANY MEDICATION THAT ENDS IN -OLOL-   The patient is asthmatic.   Penicillins Shortness Of Breath, Rash and Other (See Comments)    Has patient had a PCN reaction causing immediate rash, facial/tongue/throat swelling, SOB or lightheadedness with hypotension: Yes Has patient had a PCN reaction causing severe rash involving mucus membranes or skin necrosis: Unknown Has patient had a PCN reaction that required hospitalization No Has patient had a PCN reaction occurring within the last 10 years: No If all of the above answers are "NO", then may proceed with Cephalosporin use.    Red Dye #40 (Allura Red) Hives and Shortness Of Breath   Shellfish Allergy Anaphylaxis   Sulfa Antibiotics Hives and Shortness Of Breath   Amoxicillin-Pot Clavulanate Nausea And Vomiting   Aspirin  Other (See Comments)    Blisters on tongue from higher doses - tolerates 81 mg    Dye Fdc Red  [Red Dye #2 (Amaranth)] Hives    X ray DYE  (BENADRYL  50 MG PROPHYLAXIS AND AFTER AND  SHE DID FINE, PER PATIENT.).   Tape Dermatitis and Rash    Adhesive    Talwin [Pentazocine] Other (See Comments)    Unknown reaction    Levofloxacin  Rash and Other (See Comments)    headache   Prior to Admission medications   Medication Sig Start Date End Date Taking? Authorizing Provider  acyclovir ointment (ZOVIRAX) 5 % Apply 1 application topically every 3 (three) hours as needed (mouth sores).  01/27/18  Yes [provider]  albuterol  (PROVENTIL ) (2.5 MG/3ML) 0.083% nebulizer solution Take 2.5 mg by nebulization every 4 (four) hours as needed for shortness of breath or wheezing. 04/19/23  Yes [provider]  albuterol  (VENTOLIN  HFA) 108 (90 Base) MCG/ACT inhaler Inhale 2 puffs into the lungs every 6 (six) hours as needed for wheezing or shortness of breath. 01/24/24  Yes [provider]  atorvastatin (LIPITOR) 20 MG tablet Take 20 mg by mouth daily. 10/25/22  Yes [provider]  bisacodyl  (DULCOLAX) 10 MG suppository Place 1 suppository (10 mg total) rectally daily as needed for moderate constipation. 08/09/18  Yes Vann, Jessica U, DO  carboxymethylcellulose (REFRESH PLUS) 0.5 % SOLN Place 1 drop into both eyes 3 (three) times daily as needed (dry/irritated eyes.).   Yes [provider]  DULoxetine (CYMBALTA) 60 MG capsule Take 60 mg by mouth 2 (two) times daily.   Yes [provider]  EPINEPHrine  0.3 mg/0.3 mL IJ SOAJ injection Inject into the muscle. 08/31/22  Yes [provider]  gabapentin  (NEURONTIN ) 600 MG tablet Take 1,800 mg by mouth at bedtime.   Yes [provider]  hydrOXYzine  (ATARAX ) 50 MG tablet Take 50 mg by mouth 2 (two) times daily.   Yes [provider]  lamoTRIgine (LAMICTAL) 25 MG tablet Take 25 mg by mouth at bedtime.   Yes [provider]  LINZESS 145 MCG CAPS capsule Take 145 mcg by mouth daily before breakfast. 03/11/22  Yes [provider]  lisinopril (ZESTRIL) 10 MG tablet Take 10 mg by mouth in the morning.   Yes [provider]  morphine  (MS CONTIN ) 30 MG 12 hr tablet Take 1 tablet (30 mg total) by  mouth every 12 (twelve) hours. 04/28/17  Yes Verlyn Goad, MD  NYSTATIN  powder Apply 1 application topically 3 (three) times daily as needed (blistering sores under breast).  05/23/17  Yes [provider]  oxyCODONE -acetaminophen  (PERCOCET) 10-325 MG tablet Take 1 tablet by mouth every 6 (six) hours as needed for pain. Hold for sedation Patient taking differently: Take 1 tablet by mouth 4 (four) times daily. Hold for sedation 08/09/18  Yes Vann, Jessica U, DO  OXYGEN  Inhale 3 L into the lungs See admin instructions. When laying down   Yes [provider]  pantoprazole  (PROTONIX ) 40 MG tablet Take 40 mg by mouth daily.   Yes [provider]  prazosin  (MINIPRESS ) 5 MG capsule Take 5 mg by mouth at bedtime. 05/03/18  Yes [provider]  promethazine  (PHENERGAN ) 12.5 MG tablet Take 25 mg by mouth every 8 (eight) hours as needed for nausea or vomiting. 03/11/22  Yes [provider]  QUEtiapine  (SEROQUEL ) 300 MG tablet Take 300 mg by mouth at bedtime. 08/06/18  Yes [provider]  tiZANidine  (ZANAFLEX ) 4 MG tablet Take 8 mg by mouth 3 (three) times daily as needed for muscle spasms.   Yes [provider]  acetaminophen  (TYLENOL ) 500 MG tablet Take 1 tablet (500 mg total) by mouth every 6 (six) hours as needed for mild pain (pain score 1-3) or moderate pain (pain score 4-6). 03/12/24   Camelia Stelzner M, PA-C  cephALEXin (KEFLEX) 500 MG capsule Take 2,000 mg by mouth See admin instructions. TAKE 4 CAPSULES (2000 MG) BY MOUTH 1 HR PRIOR TO DENTAL PROCEDURE 04/01/24   [provider]  diazepam  (VALIUM ) 10 MG tablet Take 10 mg by mouth See admin instructions. TAKE 1 TABLET BY MOUTH 1 HR PRIOR TO DENTAL PROCEDURE, DO NOT DRIVE OR OPERATE HEAVY MACHINERY 04/01/24   [provider]  Immune Globulin 10% (GAMMAGARD) 10 GM/100ML SOLN Inject into the vein every 30 (thirty) days.  [provider]  meloxicam  (MOBIC ) 15 MG tablet Take 1  tablet (15 mg total) by mouth daily as needed for pain (and inflammation). 03/12/24   Lamichael Youkhana M, PA-C  traMADol  (ULTRAM ) 50 MG tablet Take 1 tablet (50 mg total) by mouth every 6 (six) hours as needed for severe pain (pain score 7-10). after surgery that is not controlled by first taking your normal daily dose of Morphine  and Percocet 03/12/24 03/12/25  Ceasia Elwell M, PA-C  amphetamine-dextroamphetamine (ADDERALL) 30 MG tablet Take 30 mg by mouth 2 (two) times daily.  10/02/15  [provider]  FLUoxetine  (PROZAC ) 40 MG capsule Take 1 capsule (40 mg total) by mouth daily. 10/02/15 01/10/17  Wray Heady, MD     Positive ROS: All other systems have been reviewed and were otherwise negative with the exception of those mentioned in the HPI and as above.  Physical Exam: General: Alert, no acute distress Cardiovascular: No pedal edema Respiratory: No cyanosis, no use of accessory musculature GI: No organomegaly, abdomen is soft and non-tender Skin: No lesions in the area of chief complaint Neurologic: Sensation intact distally Psychiatric: Patient is competent for consent with normal mood and affect Lymphatic: No axillary or cervical lymphadenopathy  MUSCULOSKELETAL: mild TTP left clavicle, deformity present, incision c/d/I, some decreased skin sensation to chest but otherwise NVI   Imaging: xrays show she has been noncompliant with her postop instructions of remaining NWB and in the sling and thus has caused several of the screws to back out of the plate and pulled apart the reduction leading to reproduction of the fractured components    Assessment: failed hardware  Plan: Plan for Procedure(s): OPEN REDUCTION INTERNAL FIXATION (ORIF) CLAVICULAR FRACTURE  The risks benefits and alternatives were discussed with the patient including but not limited to the risks of nonoperative treatment, versus surgical intervention including infection, bleeding, nerve injury,  blood clots,  cardiopulmonary complications, morbidity, mortality, among others, and they were willing to proceed.   Weightbearing: strict NWB LUE Orthopedic devices: sling Showering: POD 3 Dressing: reinforce PRN Medicines: already on Morphine  30mg  bid & Perc 10-325mg  qid; ASA, Ultram , Tylenol , Mobic , Zofran   Discharge: home Follow up: 04/19/24 at [address] Marilyn [place] Office [phone] 04/04/2024 5:05 PM

## 2024-04-05 DIAGNOSIS — D801 Nonfamilial hypogammaglobulinemia: Secondary | ICD-10-CM | POA: Diagnosis not present

## 2024-04-08 ENCOUNTER — Encounter (HOSPITAL_COMMUNITY): Admission: RE | Admit: 2024-04-08 | Source: Ambulatory Visit

## 2024-04-08 ENCOUNTER — Encounter (HOSPITAL_COMMUNITY): Payer: Self-pay | Admitting: Orthopedic Surgery

## 2024-04-08 ENCOUNTER — Other Ambulatory Visit: Payer: Self-pay

## 2024-04-08 NOTE — Anesthesia Preprocedure Evaluation (Signed)
 Anesthesia Evaluation  Patient identified by MRN, date of birth, ID band Patient awake    Reviewed: Allergy & Precautions, NPO status , Patient's Chart, lab work & pertinent test results  Airway Mallampati: IV  TM Distance: >3 FB Neck ROM: Full    Dental  (+) Teeth Intact, Dental Advisory Given   Pulmonary shortness of breath, lying and Long-Term Oxygen  Therapy, asthma , sleep apnea (noncompliant w/ cpap, unable to tolerate) , COPD (3lpm [place] qhs),  oxygen  dependent, former smoker   Pulmonary exam normal breath sounds clear to auscultation       Cardiovascular hypertension, Pt. on medications Normal cardiovascular exam Rhythm:Regular Rate:Normal     Neuro/Psych  Headaches PSYCHIATRIC DISORDERS Anxiety Depression Bipolar Disorder    Neuromuscular disease (b/l hands peripheral neuropathy)    GI/Hepatic Neg liver ROS,GERD  Controlled and Medicated,,  Endo/Other  Obesity BMI 37  Renal/GU Renal InsufficiencyRenal disease  negative genitourinary   Musculoskeletal  (+) Arthritis , Osteoarthritis,  Fibromyalgia -L mid-clavicle fx Percocet 10/325- 4x/d Morphine  30mg - BID  Chronic pain- all over   Abdominal   Peds  Hematology negative hematology ROS (+)   Anesthesia Other Findings   Reproductive/Obstetrics negative OB ROS                              Anesthesia Physical Anesthesia Plan  ASA: 4  Anesthesia Plan: General   Post-op Pain Management: Tylenol  PO (pre-op)*   Induction: Intravenous  PONV Risk Score and Plan: 3 and Ondansetron , Dexamethasone , Treatment may vary due to age or medical condition and Midazolam   Airway Management Planned: Oral ETT and Video Laryngoscope Planned  Additional Equipment: None  Intra-op Plan:   Post-operative Plan: Extubation in OR  Informed Consent: I have reviewed the patients History and Physical, chart, labs and discussed the procedure including the  risks, benefits and alternatives for the proposed anesthesia with the patient or authorized representative who has indicated his/her understanding and acceptance.     Dental advisory given  Plan Discussed with: CRNA  Anesthesia Plan Comments: (PAT note written 04/08/2024 by Arrielle Mcginn, PA-C.  )         Anesthesia Quick Evaluation

## 2024-04-08 NOTE — Progress Notes (Signed)
 Anesthesia Chart Review: SAME DAY WORK-UP  Case: [id number] Date/Time: 04/09/24 1215   Procedure: OPEN REDUCTION INTERNAL FIXATION (ORIF) CLAVICULAR FRACTURE (Left)   Anesthesia type: Choice   Diagnosis: Closed displaced fracture of left clavicle, unspecified part of clavicle, initial encounter [S42.002A]   Pre-op diagnosis: failed hardware   [facility] RO[facility] 12 / [facility]   Surgeons: Saundra Curl, MD       DISCUSSION: Patient is a 63 year old female scheduled for the above procedure. She had a mechanical fall on 02/22/24 resulting in a left clavicular fracture, s/p ORIF 03/12/24. Ward, Camilo Cella, PA-C reviewed chart with anesthesiologist prior to that case (see Date of Service 03/08/24)  History includes former smoker (quit 01/17/17), HTN, OSA (does not use CPAP), COPD (as needed home O2 3L), asthma (moderate persistent), CKD (Stage II), Common Variable Immunodeficiency (CVID,s/p Gammagard)), GERD, fatty liver, neuropathy (hands), spinal surgery (C4-6 ACDF), ventral hernia repair (12/25/13), Bipolar disorder, anxiety.  She follows with pulmonology at [facility] for history of COPD and moderate persistent asthma.  Last visit 12/27/23 with Sandie Cross, PA. Solumedrol and given and chest CT ordered for worsening cough with wheezing. CT showed minimal emphysema, slightly improved RLL opacities, likely infectious or inflammatory. She was also re-referred back to cardiology for intermittent chest pain since, patient did not schedule in October 2024 when initially referred. As above this was previously reviewed with anesthesiologist prior to left clavicular ORIF earlier this month. Prior anesthesia APP review also in April 2023 for history of recurrent chest pains dating back to at least 2019, with last visit 07/14/21 with Dr. Lethea Ravel. Symptoms felt likely noncardiac (ie, nonexertional, present > 1 year) based on prior work-up, although stress test have been ordered in the past but never done. Unremarkable echo in  2017. She was also referred to GI at that time and did ultimately get established at [facility] in [place] and treated for GERD, drug-induced constipation, gastroparesis.     Anesthesia team to evaluate on the day of surgery.    VS: Ht 5\' 5"  (1.651 m)   Wt 99.8 kg   BMI 36.61 kg/m  BP Readings from Last 3 Encounters:  03/12/24 115/88  02/22/24 (!) 135/98  11/15/22 131/77   Pulse Readings from Last 3 Encounters:  03/12/24 94  02/22/24 96  11/15/22 81     PROVIDERS: Emaline Handsome, MD Donnice Gale, MD is pulmonologist Durrell Gilles, MD is psychiatrist Darene Economy, PA-C is Pain Management - Previous evaluation by cardiologist Amos Balint, MD on 07/14/21.     LABS: Most recent labs results in [facility] include: Lab Results  Component Value Date   WBC 10.3 02/22/2024   HGB 11.9 (L) 02/22/2024   HCT 39.1 02/22/2024   PLT 285 02/22/2024   GLUCOSE 97 02/22/2024   ALT 23 11/12/2022   AST 17 11/12/2022   NA 141 02/22/2024   K 3.8 02/22/2024   CL 115 (H) 02/22/2024   CREATININE 1.08 (H) 02/22/2024   BUN 13 02/22/2024   CO2 15 (L) 02/22/2024   TSH 0.306 (L) 04/22/2017   INR 1.0 11/12/2022    OTHER: Six minute walk test 12/27/23 ([facility]): Patient's resting O2 was 90%.  Patient's lowest O2 on exertion was 89%.  Total feet walked was 660 .   Respiratory Flow Volume Loop 09/15/23 ([facility]): IMPRESSION: There is a moderate obstructive ventilatory defect.   Spirometry 07/18/23 ([facility]): There is a moderate obstructive ventilatory defect.  Normal TLC.  Normal Diffusing Capacity.  There is no significant response to bronchodilators.   CPAP Titration Study 08/01/22 ([facility]): IMPRESSION: Patient here in room 5 for a CPAP study. Initial pressure was 5 cmh2o and titrated to 16 cmh2o then switched to bipap for high pressure and intolerance at 17/13 cmh2o increased to 19/15 cmh2o. Patient tolerated well. Fisher and Production designer, theatre/television/film  Snore 1 NSR    IMAGES: CT C-spine 02/22/24: IMPRESSION: No evidence of acute intracranial or cervical spine injury.  CT Chest 01/06/24 ([facility]): Impression:  1. Minimal emphysema.  2.  Previously seen right lower lobe opacities have slightly improved and are likely infectious or inflammatory.    EKG: 02/22/24: Sinus rhythm Consider anterior infarct Confirmed by Mozell Arias [phone]) on 02/22/2024 3:51:37 PM   CV: Echo 09/23/16 ([facility]): Interpretation Summary  A complete portable two-dimensional transthoracic echocardiogram with color  flow Doppler and Spectral Doppler was performed. The left ventricle is  normal in size.  There is normal left ventricular wall thickness.  The left ventricular ejection fraction is normal (60-65%).  The left ventricular wall motion is normal.  The aortic valve is not well visualized, but is grossly normal.  Estimation of right ventricular systolic pressure is not possible.  The left ventricular diastolic function is normal.   Past Medical History:  Diagnosis Date   Anxiety    Arthritis    Asthma    Chronic kidney disease    stage 2   COPD (chronic obstructive pulmonary disease) (HCC)    Depression    Dyspnea    On 3L oxygen  when laying down   Fatty liver 07/23/2018   Fibromyalgia    GERD (gastroesophageal reflux disease)    Headache    HSV infection    Hypertension    Manic depression (HCC)    Neuropathy    hands   OSA (obstructive sleep apnea)    does not use CPAP   Pneumonia    "couple of times"    Past Surgical History:  Procedure Laterality Date   ABDOMINAL SURGERY     APPENDECTOMY     BACK SURGERY     BREAST REDUCTION SURGERY     with lift   CARPAL TUNNEL RELEASE Bilateral    CESAREAN SECTION     CHOLECYSTECTOMY     COLONOSCOPY WITH PROPOFOL      FLEXOR TENOTOMY  Right 04/07/2022   Procedure: BRACHIORADIALIS RELEASE;  Surgeon: Brunilda Capra, MD;  [facility];  Service: Orthopedics;  Laterality: Right;    HERNIA REPAIR     JOINT REPLACEMENT Left    knee   NECK SURGERY     with rod   OPEN REDUCTION INTERNAL FIXATION (ORIF) DISTAL RADIAL FRACTURE Right 04/07/2022   Procedure: OPEN REDUCTION INTERNAL FIXATION (ORIF) RIGHT DISTAL RADIUS FRACTURE;  Surgeon: Brunilda Capra, MD;  [facility];  Service: Orthopedics;  Laterality: Right;  75 MIN   ORIF CLAVICULAR FRACTURE Left 03/12/2024   Procedure: OPEN REDUCTION INTERNAL FIXATION (ORIF) CLAVICULAR FRACTURE;  Surgeon: Saundra Curl, MD;  [facility];  Service: Orthopedics;  Laterality: Left;   TIBIA IM NAIL INSERTION Right 11/12/2022   Procedure: INTRAMEDULLARY (IM) NAIL TIBIAL;  Surgeon: Ali Ink, MD;  [facility];  Service: Orthopedics;  Laterality: Right;   TONSILLECTOMY     TOOTH EXTRACTION     with sedation   UPPER GI ENDOSCOPY      MEDICATIONS: No current facility-administered medications for this encounter.    acyclovir ointment (ZOVIRAX) 5 %  albuterol  (PROVENTIL ) (2.5 MG/3ML) 0.083% nebulizer solution   albuterol  (VENTOLIN  HFA) 108 (90 Base) MCG/ACT inhaler   atorvastatin (LIPITOR) 20 MG tablet   bisacodyl  (DULCOLAX) 10 MG suppository   carboxymethylcellulose (REFRESH PLUS) 0.5 % SOLN   DULoxetine (CYMBALTA) 60 MG capsule   EPINEPHrine  0.3 mg/0.3 mL IJ SOAJ injection   gabapentin  (NEURONTIN ) 600 MG tablet   hydrOXYzine  (ATARAX ) 50 MG tablet   lamoTRIgine (LAMICTAL) 25 MG tablet   LINZESS 145 MCG CAPS capsule   lisinopril (ZESTRIL) 10 MG tablet   morphine  (MS CONTIN ) 30 MG 12 hr tablet   NYSTATIN  powder   oxyCODONE -acetaminophen  (PERCOCET) 10-325 MG tablet   OXYGEN    pantoprazole  (PROTONIX ) 40 MG tablet   prazosin  (MINIPRESS ) 5 MG capsule   promethazine  (PHENERGAN ) 12.5 MG tablet   QUEtiapine  (SEROQUEL ) 300 MG tablet   tiZANidine  (ZANAFLEX ) 4 MG tablet   acetaminophen  (TYLENOL ) 500 MG tablet   cephALEXin (KEFLEX) 500 MG capsule   diazepam  (VALIUM ) 10 MG tablet   Immune Globulin 10% (GAMMAGARD) 10  GM/100ML SOLN   meloxicam  (MOBIC ) 15 MG tablet   traMADol  (ULTRAM ) 50 MG tablet   Ella Gun, PA-C Surgical Short Stay/Anesthesiology [facility] Phone [phone] [facility] Phone [phone] 04/08/2024 1:09 PM

## 2024-04-08 NOTE — Progress Notes (Signed)
 PCP - Dr Channing Commander Cardiologist - none Gastro - Angeline Kemps, PA-C [facility] - Darene Economy, [place] Pulmonology - Sandie Cross, [place]  CT Chest x-ray - 01/06/24 [facility] EKG - 02/22/24 Stress Test/Walk Test  - 12/27/23 ECHO - 09/23/16 [facility] Cardiac Cath - n/a  ICD Pacemaker/Loop - n/a  Sleep Study -  Yes-positive test  CPAP - does not use CPAP  Diabetes - n/a  Oxygen  at 3 L via [place] when laying down  Aspirin  & Blood Thinner Instructions:  n/a  ERAS - clears til 9:30 AM DOS.  Anesthesia review: Yes  Cardio referral was made on 12/27/23 and again at 03/08/24 OV but patient has not followed up. (Pt with dyspnea & chest pain with exertion per OV note on 12/27/23). .  STOP now taking any Aspirin  (unless otherwise instructed by your surgeon), Aleve, Naproxen, Ibuprofen, Motrin, Advil, Goody's, BC's, all herbal medications, fish oil, and all vitamins.   Coronavirus Screening Do you have any of the following symptoms:  Cough yes/no: No Fever (>100.45F)  yes/no: No Runny nose yes/no: No Sore throat yes/no: No Difficulty breathing/shortness of breath  yes/no: No  Have you traveled in the last 14 days and where? yes/no: No  Patient verbalized understanding of instructions that were given via phone.

## 2024-04-09 ENCOUNTER — Ambulatory Visit (HOSPITAL_COMMUNITY): Admitting: Vascular Surgery

## 2024-04-09 ENCOUNTER — Ambulatory Visit (HOSPITAL_BASED_OUTPATIENT_CLINIC_OR_DEPARTMENT_OTHER): Admitting: Vascular Surgery

## 2024-04-09 ENCOUNTER — Other Ambulatory Visit: Payer: Self-pay

## 2024-04-09 ENCOUNTER — Ambulatory Visit (HOSPITAL_COMMUNITY)

## 2024-04-09 ENCOUNTER — Ambulatory Visit (HOSPITAL_COMMUNITY)
Admission: RE | Admit: 2024-04-09 | Discharge: 2024-04-09 | Disposition: A | Discharge status: Home / Self Care | Attending: Orthopedic Surgery | Admitting: Orthopedic Surgery

## 2024-04-09 ENCOUNTER — Encounter (HOSPITAL_COMMUNITY)
Admission: RE | Disposition: A | Discharge status: Home / Self Care | Payer: Self-pay | Source: Home / Self Care | Attending: Orthopedic Surgery

## 2024-04-09 DIAGNOSIS — R0602 Shortness of breath: Secondary | ICD-10-CM | POA: Diagnosis not present

## 2024-04-09 DIAGNOSIS — X58XXXA Exposure to other specified factors, initial encounter: Secondary | ICD-10-CM | POA: Diagnosis not present

## 2024-04-09 DIAGNOSIS — G4730 Sleep apnea, unspecified: Secondary | ICD-10-CM

## 2024-04-09 DIAGNOSIS — R519 Headache, unspecified: Secondary | ICD-10-CM | POA: Diagnosis not present

## 2024-04-09 DIAGNOSIS — J439 Emphysema, unspecified: Secondary | ICD-10-CM | POA: Insufficient documentation

## 2024-04-09 DIAGNOSIS — Z791 Long term (current) use of non-steroidal anti-inflammatories (NSAID): Secondary | ICD-10-CM | POA: Diagnosis not present

## 2024-04-09 DIAGNOSIS — J449 Chronic obstructive pulmonary disease, unspecified: Secondary | ICD-10-CM | POA: Diagnosis not present

## 2024-04-09 DIAGNOSIS — T84228A Displacement of internal fixation device of other bones, initial encounter: Secondary | ICD-10-CM | POA: Diagnosis not present

## 2024-04-09 DIAGNOSIS — K219 Gastro-esophageal reflux disease without esophagitis: Secondary | ICD-10-CM | POA: Insufficient documentation

## 2024-04-09 DIAGNOSIS — Z9981 Dependence on supplemental oxygen: Secondary | ICD-10-CM | POA: Insufficient documentation

## 2024-04-09 DIAGNOSIS — G4733 Obstructive sleep apnea (adult) (pediatric): Secondary | ICD-10-CM | POA: Insufficient documentation

## 2024-04-09 DIAGNOSIS — M797 Fibromyalgia: Secondary | ICD-10-CM | POA: Diagnosis not present

## 2024-04-09 DIAGNOSIS — I129 Hypertensive chronic kidney disease with stage 1 through stage 4 chronic kidney disease, or unspecified chronic kidney disease: Secondary | ICD-10-CM | POA: Diagnosis not present

## 2024-04-09 DIAGNOSIS — I10 Essential (primary) hypertension: Secondary | ICD-10-CM | POA: Diagnosis not present

## 2024-04-09 DIAGNOSIS — T849XXA Unspecified complication of internal orthopedic prosthetic device, implant and graft, initial encounter: Secondary | ICD-10-CM | POA: Diagnosis not present

## 2024-04-09 DIAGNOSIS — Z91198 Patient's noncompliance with other medical treatment and regimen for other reason: Secondary | ICD-10-CM | POA: Insufficient documentation

## 2024-04-09 DIAGNOSIS — N182 Chronic kidney disease, stage 2 (mild): Secondary | ICD-10-CM | POA: Insufficient documentation

## 2024-04-09 DIAGNOSIS — S42002A Fracture of unspecified part of left clavicle, initial encounter for closed fracture: Secondary | ICD-10-CM

## 2024-04-09 DIAGNOSIS — N1831 Chronic kidney disease, stage 3a: Secondary | ICD-10-CM | POA: Diagnosis not present

## 2024-04-09 DIAGNOSIS — T84298A Other mechanical complication of internal fixation device of other bones, initial encounter: Secondary | ICD-10-CM | POA: Diagnosis not present

## 2024-04-09 DIAGNOSIS — Z79899 Other long term (current) drug therapy: Secondary | ICD-10-CM | POA: Insufficient documentation

## 2024-04-09 DIAGNOSIS — Z87891 Personal history of nicotine dependence: Secondary | ICD-10-CM | POA: Insufficient documentation

## 2024-04-09 DIAGNOSIS — Z7951 Long term (current) use of inhaled steroids: Secondary | ICD-10-CM | POA: Insufficient documentation

## 2024-04-09 HISTORY — PX: ORIF CLAVICULAR FRACTURE: SHX5055

## 2024-04-09 LAB — CBC
HCT: 40.9 % (ref 36.0–46.0)
Hemoglobin: 13.3 g/dL (ref 12.0–15.0)
MCH: 28.5 pg (ref 26.0–34.0)
MCHC: 32.5 g/dL (ref 30.0–36.0)
MCV: 87.8 fL (ref 80.0–100.0)
Platelets: 327 10*3/uL (ref 150–400)
RBC: 4.66 MIL/uL (ref 3.87–5.11)
RDW: 13.9 % (ref 11.5–15.5)
WBC: 6.3 10*3/uL (ref 4.0–10.5)
nRBC: 0 % (ref 0.0–0.2)

## 2024-04-09 LAB — BASIC METABOLIC PANEL WITH GFR
Anion gap: 11 (ref 5–15)
BUN: 8 mg/dL (ref 8–23)
CO2: 20 mmol/L — ABNORMAL LOW (ref 22–32)
Calcium: 8.5 mg/dL — ABNORMAL LOW (ref 8.9–10.3)
Chloride: 107 mmol/L (ref 98–111)
Creatinine, Ser: 1.22 mg/dL — ABNORMAL HIGH (ref 0.44–1.00)
GFR, Estimated: 50 mL/min — ABNORMAL LOW (ref >60)
Glucose, Bld: 117 mg/dL — ABNORMAL HIGH (ref 70–99)
Potassium: 4 mmol/L (ref 3.5–5.1)
Sodium: 138 mmol/L (ref 135–145)

## 2024-04-09 SURGERY — OPEN REDUCTION INTERNAL FIXATION (ORIF) CLAVICULAR FRACTURE
Anesthesia: General | Site: Shoulder | Laterality: Left

## 2024-04-09 MED ORDER — DEXAMETHASONE SODIUM PHOSPHATE 10 MG/ML IJ SOLN
INTRAMUSCULAR | Status: AC
  Filled 2024-04-09: qty 1

## 2024-04-09 MED ORDER — POVIDONE-IODINE 10 % EX SWAB: 2.0000 | Freq: Once | CUTANEOUS | Status: DC

## 2024-04-09 MED ORDER — MIDAZOLAM HCL 2 MG/2ML IJ SOLN
INTRAMUSCULAR | Status: AC
  Filled 2024-04-09: qty 2

## 2024-04-09 MED ORDER — CHLORHEXIDINE GLUCONATE 0.12 % MT SOLN
15.0000 mL | Freq: Once | OROMUCOSAL | Status: AC
Start: 2024-04-09 — End: 2024-04-09
  Administered 2024-04-09: 15 mL via OROMUCOSAL
  Filled 2024-04-09: qty 15

## 2024-04-09 MED ORDER — ROCURONIUM BROMIDE 10 MG/ML (PF) SYRINGE
PREFILLED_SYRINGE | INTRAVENOUS | Status: AC
  Filled 2024-04-09: qty 10

## 2024-04-09 MED ORDER — DEXMEDETOMIDINE HCL IN NACL 80 MCG/20ML IV SOLN
INTRAVENOUS | Status: DC | PRN
  Administered 2024-04-09: 6 ug via INTRAVENOUS

## 2024-04-09 MED ORDER — DEXAMETHASONE SODIUM PHOSPHATE 10 MG/ML IJ SOLN
INTRAMUSCULAR | Status: DC | PRN
Start: 2024-04-09 — End: 2024-04-09
  Administered 2024-04-09: 10 mg via INTRAVENOUS

## 2024-04-09 MED ORDER — BUPIVACAINE-EPINEPHRINE (PF) 0.5% -1:200000 IJ SOLN
INTRAMUSCULAR | Status: DC | PRN
  Administered 2024-04-09: 30 mL

## 2024-04-09 MED ORDER — PROPOFOL 10 MG/ML IV BOLUS
INTRAVENOUS | Status: AC
  Filled 2024-04-09: qty 20

## 2024-04-09 MED ORDER — TRAMADOL HCL 50 MG PO TABS: 50.0000 mg | ORAL_TABLET | Freq: Four times a day (QID) | ORAL | 0 refills | Status: DC | PRN

## 2024-04-09 MED ORDER — MIDAZOLAM HCL 2 MG/2ML IJ SOLN
INTRAMUSCULAR | Status: DC | PRN
  Administered 2024-04-09: 2 mg via INTRAVENOUS

## 2024-04-09 MED ORDER — PHENYLEPHRINE HCL (PRESSORS) 10 MG/ML IV SOLN
INTRAVENOUS | Status: AC
  Filled 2024-04-09: qty 1

## 2024-04-09 MED ORDER — ACETAMINOPHEN 500 MG PO TABS
1000.0000 mg | ORAL_TABLET | Freq: Once | ORAL | Status: AC
  Administered 2024-04-09: 1000 mg via ORAL
  Filled 2024-04-09: qty 2

## 2024-04-09 MED ORDER — CEFAZOLIN SODIUM-DEXTROSE 2-4 GM/100ML-% IV SOLN
2.0000 g | INTRAVENOUS | Status: AC
  Administered 2024-04-09: 2 g via INTRAVENOUS
  Filled 2024-04-09: qty 100

## 2024-04-09 MED ORDER — PROPOFOL 10 MG/ML IV BOLUS
INTRAVENOUS | Status: DC | PRN
  Administered 2024-04-09: 130 mg via INTRAVENOUS

## 2024-04-09 MED ORDER — ROCURONIUM BROMIDE 10 MG/ML (PF) SYRINGE
PREFILLED_SYRINGE | INTRAVENOUS | Status: DC | PRN
  Administered 2024-04-09: 60 mg via INTRAVENOUS

## 2024-04-09 MED ORDER — PHENYLEPHRINE HCL-NACL 20-0.9 MG/250ML-% IV SOLN
INTRAVENOUS | Status: DC | PRN
  Administered 2024-04-09: 30 ug/min via INTRAVENOUS

## 2024-04-09 MED ORDER — BUPIVACAINE-EPINEPHRINE (PF) 0.25% -1:200000 IJ SOLN
INTRAMUSCULAR | Status: AC
  Filled 2024-04-09: qty 30

## 2024-04-09 MED ORDER — FENTANYL CITRATE (PF) 250 MCG/5ML IJ SOLN
INTRAMUSCULAR | Status: AC
  Filled 2024-04-09: qty 5

## 2024-04-09 MED ORDER — ONDANSETRON HCL 4 MG/2ML IJ SOLN
INTRAMUSCULAR | Status: DC | PRN
  Administered 2024-04-09: 4 mg via INTRAVENOUS

## 2024-04-09 MED ORDER — FENTANYL CITRATE (PF) 250 MCG/5ML IJ SOLN
INTRAMUSCULAR | Status: DC | PRN
  Administered 2024-04-09: 50 ug via INTRAVENOUS
  Administered 2024-04-09: 100 ug via INTRAVENOUS
  Administered 2024-04-09: 50 ug via INTRAVENOUS

## 2024-04-09 MED ORDER — LACTATED RINGERS IV SOLN
INTRAVENOUS | Status: DC
Start: 2024-04-09 — End: 2024-04-09

## 2024-04-09 MED ORDER — PHENYLEPHRINE 80 MCG/ML (10ML) SYRINGE FOR IV PUSH (FOR BLOOD PRESSURE SUPPORT)
PREFILLED_SYRINGE | INTRAVENOUS | Status: DC | PRN
  Administered 2024-04-09: 80 ug via INTRAVENOUS
  Administered 2024-04-09: 160 ug via INTRAVENOUS
  Administered 2024-04-09: 80 ug via INTRAVENOUS
  Administered 2024-04-09: 160 ug via INTRAVENOUS
  Administered 2024-04-09 (×2): 80 ug via INTRAVENOUS
  Administered 2024-04-09: 160 ug via INTRAVENOUS

## 2024-04-09 MED ORDER — KETAMINE HCL 50 MG/5ML IJ SOSY
PREFILLED_SYRINGE | INTRAMUSCULAR | Status: DC | PRN
  Administered 2024-04-09: 20 mg via INTRAVENOUS

## 2024-04-09 MED ORDER — LIDOCAINE 2% (20 MG/ML) 5 ML SYRINGE
INTRAMUSCULAR | Status: DC | PRN
Start: 2024-04-09 — End: 2024-04-09
  Administered 2024-04-09: 100 mg via INTRAVENOUS

## 2024-04-09 MED ORDER — KETAMINE HCL 50 MG/5ML IJ SOSY
PREFILLED_SYRINGE | INTRAMUSCULAR | Status: AC
  Filled 2024-04-09: qty 5

## 2024-04-09 MED ORDER — BUPIVACAINE-EPINEPHRINE (PF) 0.5% -1:200000 IJ SOLN
INTRAMUSCULAR | Status: AC
  Filled 2024-04-09: qty 30

## 2024-04-09 MED ORDER — FENTANYL CITRATE (PF) 100 MCG/2ML IJ SOLN: 25.0000 ug | INTRAMUSCULAR | Status: DC | PRN

## 2024-04-09 MED ORDER — EPHEDRINE SULFATE-NACL 50-0.9 MG/10ML-% IV SOSY
PREFILLED_SYRINGE | INTRAVENOUS | Status: DC | PRN
  Administered 2024-04-09 (×2): 7.5 mg via INTRAVENOUS

## 2024-04-09 MED ORDER — 0.9 % SODIUM CHLORIDE (POUR BTL) OPTIME
TOPICAL | Status: DC | PRN
  Administered 2024-04-09: 1000 mL

## 2024-04-09 MED ORDER — AMISULPRIDE (ANTIEMETIC) 5 MG/2ML IV SOLN: 10.0000 mg | Freq: Once | INTRAVENOUS | Status: DC | PRN

## 2024-04-09 MED ORDER — ONDANSETRON HCL 4 MG/2ML IJ SOLN: 4.0000 mg | Freq: Once | INTRAMUSCULAR | Status: DC | PRN

## 2024-04-09 MED ORDER — LIDOCAINE 2% (20 MG/ML) 5 ML SYRINGE
INTRAMUSCULAR | Status: AC
  Filled 2024-04-09: qty 5

## 2024-04-09 MED ORDER — VANCOMYCIN HCL 500 MG IV SOLR
INTRAVENOUS | Status: DC | PRN
  Administered 2024-04-09: 500 mg via TOPICAL

## 2024-04-09 MED ORDER — VANCOMYCIN HCL 500 MG IV SOLR
INTRAVENOUS | Status: AC
  Filled 2024-04-09: qty 10

## 2024-04-09 MED ORDER — ORAL CARE MOUTH RINSE: 15.0000 mL | Freq: Once | OROMUCOSAL | Status: AC

## 2024-04-09 MED ORDER — SUGAMMADEX SODIUM 200 MG/2ML IV SOLN
INTRAVENOUS | Status: DC | PRN
  Administered 2024-04-09: 200 mg via INTRAVENOUS

## 2024-04-09 SURGICAL SUPPLY — 49 items
BAG COUNTER SPONGE SURGICOUNT (BAG) ×1 IMPLANT
BIT DRILL LONG 2.6X220 (BIT) IMPLANT
CLEANER TIP ELECTROSURG 2X2 (MISCELLANEOUS) ×1 IMPLANT
CNTNR URN SCR LID CUP LEK RST (MISCELLANEOUS) IMPLANT
DRAPE IMP U-DRAPE 54X76 (DRAPES) ×1 IMPLANT
DRAPE OEC MINIVIEW 54X84 (DRAPES) IMPLANT
DRAPE SURG ORHT 6 SPLT 77X108 (DRAPES) ×2 IMPLANT
DRAPE U-SHAPE 47X51 STRL (DRAPES) ×2 IMPLANT
DRSG EMULSION OIL 3X3 NADH (GAUZE/BANDAGES/DRESSINGS) ×1 IMPLANT
DRSG MEPILEX POST OP 4X8 (GAUZE/BANDAGES/DRESSINGS) ×1 IMPLANT
DURAPREP 26ML APPLICATOR (WOUND CARE) ×1 IMPLANT
ELECTRODE REM PT RTRN 9FT ADLT (ELECTROSURGICAL) ×1 IMPLANT
FIBERTAPE CERCLAGE TLINK SUT (SUTURE) IMPLANT
GAUZE SPONGE 4X4 12PLY STRL (GAUZE/BANDAGES/DRESSINGS) ×1 IMPLANT
GLOVE BIO SURGEON STRL SZ7.5 (GLOVE) ×1 IMPLANT
GLOVE BIOGEL PI IND STRL 7.5 (GLOVE) ×1 IMPLANT
GLOVE BIOGEL PI IND STRL 8 (GLOVE) ×1 IMPLANT
GLOVE SURG SYN 7.5 E (GLOVE) ×1 IMPLANT
GLOVE SURG SYN 7.5 PF PI (GLOVE) ×1 IMPLANT
GOWN STRL REUS W/ TWL LRG LVL3 (GOWN DISPOSABLE) ×1 IMPLANT
GOWN STRL REUS W/ TWL XL LVL3 (GOWN DISPOSABLE) ×2 IMPLANT
KIT BASIN OR (CUSTOM PROCEDURE TRAY) ×1 IMPLANT
KIT TURNOVER KIT B (KITS) ×1 IMPLANT
MANIFOLD NEPTUNE II (INSTRUMENTS) ×1 IMPLANT
MIX DBX 20CC MTF (Putty) IMPLANT
NDL HYPO 25GX1X1/2 BEV (NEEDLE) ×1 IMPLANT
NEEDLE HYPO 25GX1X1/2 BEV (NEEDLE) ×1 IMPLANT
NS IRRIG 1000ML POUR BTL (IV SOLUTION) ×1 IMPLANT
PACK SHOULDER (CUSTOM PROCEDURE TRAY) ×1 IMPLANT
PACK UNIVERSAL I (CUSTOM PROCEDURE TRAY) ×1 IMPLANT
PAD ARMBOARD POSITIONER FOAM (MISCELLANEOUS) ×2 IMPLANT
PLATE SUPERIOR MIDSHAGT 8H LT (Plate) IMPLANT
RESTRAINT HEAD UNIVERSAL NS (MISCELLANEOUS) IMPLANT
SCREW BONE 18X3.5 (Screw) IMPLANT
SCREW BONE 3.5X16MM (Screw) IMPLANT
SCREW BONE 3.5X20MM (Screw) IMPLANT
SCREW BONE THRD T10 3.5XL22 (Screw) IMPLANT
SLING ARM FOAM STRAP LRG (SOFTGOODS) ×1 IMPLANT
SPONGE T-LAP 4X18 ~~LOC~~+RFID (SPONGE) ×2 IMPLANT
STRIP CLOSURE SKIN 1/2X4 (GAUZE/BANDAGES/DRESSINGS) ×2 IMPLANT
SUCTION TUBE FRAZIER 10FR DISP (SUCTIONS) ×1 IMPLANT
SUT MNCRL AB 4-0 PS2 18 (SUTURE) ×1 IMPLANT
SUT MON AB 2-0 CT1 36 (SUTURE) ×1 IMPLANT
SUT VIC AB 0 CT1 27XBRD ANBCTR (SUTURE) IMPLANT
SYR CONTROL 10ML LL (SYRINGE) ×1 IMPLANT
TOWEL GREEN STERILE (TOWEL DISPOSABLE) ×1 IMPLANT
TOWEL GREEN STERILE FF (TOWEL DISPOSABLE) ×1 IMPLANT
TOWEL OR NON WOVEN STRL DISP B (DISPOSABLE) ×1 IMPLANT
WATER STERILE IRR 1000ML POUR (IV SOLUTION) ×1 IMPLANT

## 2024-04-09 NOTE — Transfer of Care (Addendum)
 Immediate Anesthesia Transfer of Care Note  Patient: Deanna Yang  Procedure(s) Performed: OPEN REDUCTION INTERNAL FIXATION (ORIF) CLAVICULAR FRACTURE REVISION WITH REMOVAL OF PREVIOUS HARDWARE (Left: Shoulder)  Patient Location: PACU  Anesthesia Type:General  Level of Consciousness: drowsy  Airway & Oxygen  Therapy: Patient Spontanous Breathing  Post-op Assessment: Report given to RN  Post vital signs: Reviewed and stable  Last Vitals:  Vitals Value Taken Time  BP 120/74 04/09/24 1330  Temp 36.3 C 04/09/24 1330  Pulse 167 04/09/24 1330  Resp 12 04/09/24 1332  SpO2 99 % 04/09/24 1330  Vitals shown include unfiled device data.  Last Pain:  Vitals:   04/09/24 0945  TempSrc:   PainSc: 5       Patients Stated Pain Goal: 3 (04/09/24 0956)  Complications: No notable events documented.

## 2024-04-09 NOTE — Interval H&P Note (Signed)
 History and Physical Interval Note:  04/09/2024 8:57 AM  Deanna Yang  has presented today for surgery, with the diagnosis of failed hardware.  The various methods of treatment have been discussed with the patient and family. After consideration of risks, benefits and other options for treatment, the patient has consented to  Procedure(s): OPEN REDUCTION INTERNAL FIXATION (ORIF) CLAVICULAR FRACTURE (Left) as a surgical intervention.  The patient's history has been reviewed, patient examined, no change in status, stable for surgery.  I have reviewed the patient's chart and labs.  Questions were answered to the patient's satisfaction.     Saundra Curl

## 2024-04-09 NOTE — Anesthesia Procedure Notes (Signed)
 Procedure Name: Intubation Date/Time: 04/09/2024 11:31 AM  Performed by: Raylene Calamity, CRNAPre-anesthesia Checklist: Patient identified, Emergency Drugs available, Suction available and Patient being monitored Patient Re-evaluated:Patient Re-evaluated prior to induction Oxygen  Delivery Method: Circle System Utilized Preoxygenation: Pre-oxygenation with 100% oxygen  Induction Type: IV induction Ventilation: Mask ventilation without difficulty Laryngoscope Size: Glidescope and 3 Grade View: Grade I Tube type: Oral Tube size: 7.0 mm Number of attempts: 1 Airway Equipment and Method: Stylet and Oral airway Placement Confirmation: ETT inserted through vocal cords under direct vision, positive ETCO2 and breath sounds checked- equal and bilateral Secured at: 22 cm Tube secured with: Tape Dental Injury: Teeth and Oropharynx as per pre-operative assessment

## 2024-04-09 NOTE — Discharge Instructions (Addendum)

## 2024-04-10 ENCOUNTER — Encounter (HOSPITAL_COMMUNITY): Payer: Self-pay | Admitting: Orthopedic Surgery

## 2024-04-10 NOTE — Op Note (Signed)
 04/09/2024  8:05 AM  PATIENT:  Deanna Yang    PRE-OPERATIVE DIAGNOSIS:  failed hardware  POST-OPERATIVE DIAGNOSIS:  Same  PROCEDURE:  OPEN REDUCTION INTERNAL FIXATION (ORIF) CLAVICULAR FRACTURE REVISION WITH REMOVAL OF PREVIOUS HARDWARE  SURGEON:  Saundra Curl, MD  ASSISTANT: Marzella Solan, PA-C, he was present and scrubbed throughout the case, critical for completion in a timely fashion, and for retraction, instrumentation, and closure.   ANESTHESIA:   General  PREOPERATIVE INDICATIONS:  Deanna Yang is a  63 y.o. female with a diagnosis of failed hardware who failed conservative measures and elected for surgical management.    The risks benefits and alternatives were discussed with the patient preoperatively including but not limited to the risks of infection, bleeding, nerve injury, cardiopulmonary complications, the need for revision surgery, among others, and the patient was willing to proceed.  OPERATIVE IMPLANTS: Stryker 8 hole bridge plate, Arthrex cerclage DBX bone graft  OPERATIVE FINDINGS: Loss of reduction some bone loss at the fracture site  BLOOD LOSS: Minimal  COMPLICATIONS: None  TOURNIQUET TIME: None  OPERATIVE PROCEDURE:  Patient was identified in the preoperative holding area and site was marked by me She was transported to the operating theater and placed on the table in supine position taking care to pad all bony prominences. After a preincinduction time out anesthesia was induced. The left shoulder upper extremity was prepped and draped in normal sterile fashion and a pre-incision timeout was performed. She received Ancef  for preoperative antibiotics.   She was placed in the beachchair position padding all bony prominences  I made an incision through her prior incision and extended this proximally and distally maintain hemostasis.  I removed her previous clavicle plate and all screws.  I examined her fracture site there was some comminution I  did elect to perform bone grafting with DBX putty to aid in healing.  Next I performed a reduction of her fracture I selected an 8 hole plate for better fixation and placed 4 screws proximally with bicortical bite next I confirmed compression at the fracture site and placed 4 screws laterally using a compression technique for better bony opposition.  I was happy with the purchase of these I placed a cerclage Arthrex stitch around the plate just lateral to the fracture  I bone grafted at the fracture site with DBX.  I took multiple x-rays of the clavicle showing appropriate hardware placement and reduction next I performed a thorough irrigation I placed vancomycin  powder skin was closed in layers sterile dressing applied  POST OPERATIVE PLAN: Sling full-time

## 2024-04-10 NOTE — Anesthesia Postprocedure Evaluation (Signed)
 Anesthesia Post Note  Patient: Deanna Yang  Procedure(s) Performed: OPEN REDUCTION INTERNAL FIXATION (ORIF) CLAVICULAR FRACTURE REVISION WITH REMOVAL OF PREVIOUS HARDWARE (Left: Shoulder)     Patient location during evaluation: PACU Anesthesia Type: General Level of consciousness: awake and alert Pain management: pain level controlled Vital Signs Assessment: post-procedure vital signs reviewed and stable Respiratory status: spontaneous breathing, nonlabored ventilation, respiratory function stable and patient connected to nasal cannula oxygen  Cardiovascular status: blood pressure returned to baseline and stable Postop Assessment: no apparent nausea or vomiting Anesthetic complications: no   No notable events documented.  Last Vitals:  Vitals:   04/09/24 1345 04/09/24 1400  BP: 133/82 132/67  Pulse: 92 88  Resp: 10 14  Temp:  36.4 C  SpO2: 100% 92%    Last Pain:  Vitals:   04/09/24 1400  TempSrc:   PainSc: 0-No pain                 Erin Havers

## 2024-04-14 LAB — AEROBIC/ANAEROBIC CULTURE W GRAM STAIN (SURGICAL/DEEP WOUND)
Culture: NO GROWTH
Gram Stain: NONE SEEN

## 2024-04-19 DIAGNOSIS — S42002A Fracture of unspecified part of left clavicle, initial encounter for closed fracture: Secondary | ICD-10-CM | POA: Diagnosis not present

## 2024-04-27 DIAGNOSIS — J449 Chronic obstructive pulmonary disease, unspecified: Secondary | ICD-10-CM | POA: Diagnosis not present

## 2024-05-03 DIAGNOSIS — D801 Nonfamilial hypogammaglobulinemia: Secondary | ICD-10-CM | POA: Diagnosis not present

## 2024-05-13 ENCOUNTER — Ambulatory Visit (INDEPENDENT_AMBULATORY_CARE_PROVIDER_SITE_OTHER): Admitting: Orthopedic Surgery

## 2024-05-13 ENCOUNTER — Encounter: Payer: Self-pay | Admitting: Orthopedic Surgery

## 2024-05-13 DIAGNOSIS — S42002D Fracture of unspecified part of left clavicle, subsequent encounter for fracture with routine healing: Secondary | ICD-10-CM | POA: Diagnosis not present

## 2024-05-13 DIAGNOSIS — M86112 Other acute osteomyelitis, left shoulder: Secondary | ICD-10-CM | POA: Diagnosis not present

## 2024-05-13 NOTE — Progress Notes (Signed)
 Office Visit Note   Patient: Deanna Yang           Date of Birth: 11-May-1961           MRN: [id number] Visit Date: 05/13/2024              Requested by: Emaline Handsome, MD [address] [place],  [place] [address] PCP: Emaline Handsome, MD  Chief Complaint  Patient presents with   Left Shoulder - Routine Post Op    Pt is s/p 2 surgeries with Dr. Abigail Abler in April for ORIF clavicle fracture.      HPI: Patient is a 63 year old woman who was seen for initial evaluation referral from Dr. Abigail Abler.  Patient has undergone open reduction internal fixation of the left clavicle x 2.  Patient states she has redness and pain and she states that her symptoms seem consistent when she had abdominal surgery.  Patient states she developed sepsis from this abdominal surgery.  Assessment & Plan: Visit Diagnoses:  1. Acute osteomyelitis of clavicle, left (HCC)     Plan: Will plan for surgical intervention with removal of the deep retained hardware removal of infected bone and placement of vancomycin  powder and Kerecis tissue graft with negative pressure wound therapy with a hydrophilic sponge.  Plan to return to the operating room on Friday.  Will send tissue for cultures anticipate 6 weeks of postoperative antibiotics based on culture sensitivities.  Follow-Up Instructions: Return in about 2 weeks (around 05/27/2024).   Ortho Exam  Patient is alert, oriented, no adenopathy, well-dressed, normal affect, normal respiratory effort. Examination patient's left upper extremity is neurovascular intact.  There is cellulitis around the clavicle incision there is no drainage this is tender to palpation.  Review of the radiographs shows destructive bony changes of the clavicle.  The screws on the medial aspect of the plate have lost their purchase and are free-floating. Narcotics: @CHLMME @  Imaging: No results found. No images are attached to the encounter.  Labs: Lab Results   Component Value Date   REPTSTATUS 04/14/2024 FINAL 04/09/2024   GRAMSTAIN NO WBC SEEN NO ORGANISMS SEEN  04/09/2024   CULT  04/09/2024    No growth aerobically or anaerobically. Performed at [facility], [address], [place], [place] [address]      Lab Results  Component Value Date   ALBUMIN 3.1 (L) 11/12/2022   ALBUMIN 4.0 04/01/2022   ALBUMIN 3.8 08/06/2018    Lab Results  Component Value Date   MG 1.8 11/12/2022   MG 1.7 08/07/2018   MG 1.6 (L) 07/23/2018   Lab Results  Component Value Date   VD25OH 10.64 (L) 11/13/2022    No results found for: "PREALBUMIN"    Latest Ref Rng & Units 04/09/2024    9:17 AM 02/22/2024    8:51 AM 11/15/2022    8:00 AM  CBC EXTENDED  WBC 4.0 - 10.5 K/uL 6.3  10.3  9.0   RBC 3.87 - 5.11 MIL/uL 4.66  4.16  3.71   Hemoglobin 12.0 - 15.0 g/dL 40.1  02.7  25.3   HCT 36.0 - 46.0 % 40.9  39.1  32.7   Platelets 150 - 400 K/uL 327  285  367   NEUT# 1.7 - 7.7 K/uL  7.4    Lymph# 0.7 - 4.0 K/uL  1.9       There is no height or weight on file to calculate BMI.  Orders:  No orders  of the defined types were placed in this encounter.  No orders of the defined types were placed in this encounter.    Procedures: No procedures performed  Clinical Data: No additional findings.  ROS:  All other systems negative, except as noted in the HPI. Review of Systems  Objective: Vital Signs: There were no vitals taken for this visit.  Specialty Comments:  No specialty comments available.  PMFS History: Patient Active Problem List   Diagnosis Date Noted   Displaced fracture of shaft of left clavicle, initial encounter for closed fracture 03/12/2024   Closed fracture of right fibula and tibia 11/13/2022   Traumatic closed displaced fracture of right tibial plafond with fibula 11/12/2022   Depression 11/12/2022   Chronic pain syndrome 11/12/2022   Stage 3a chronic kidney disease (HCC)    Sinus tachycardia 08/07/2018    Constipation due to pain medication therapy 08/06/2018   Paralytic ileus (HCC) 07/24/2018   AKI (acute kidney injury) (HCC)    Fatty liver 07/23/2018   Malnutrition of moderate degree 07/21/2018   Hypertensive urgency 07/20/2018   Nausea and vomiting 07/20/2018   ARF (acute renal failure) (HCC) 07/19/2018   Vocal cord dysfunction 04/25/2017   PTSD (post-traumatic stress disorder) 04/25/2017   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   Acute respiratory failure with hypoxia (HCC) 04/22/2017   Hypokalemia 04/22/2017   Leukocytosis 04/22/2017   Prolonged QT interval 04/22/2017   CFIDS (chronic fatigue and immune dysfunction syndrome) (HCC) 08/20/2015   Foot pain 08/20/2015   Essential (primary) hypertension 08/20/2015   COPD (chronic obstructive pulmonary disease) (HCC) 08/20/2015   Obstructive apnea 08/20/2015   Cervical osteoarthritis 08/20/2015   Sleep disorder 08/20/2015   Other specified postprocedural states 07/17/2015   Borderline personality disorder (HCC) 06/11/2015   Past Medical History:  Diagnosis Date   Anxiety    Arthritis    Asthma    Chronic kidney disease    stage 2   COPD (chronic obstructive pulmonary disease) (HCC)    Depression    Dyspnea    On 3L oxygen  when laying down   Fatty liver 07/23/2018   Fibromyalgia    GERD (gastroesophageal reflux disease)    Headache    HSV infection    Hypertension    Manic depression (HCC)    Neuropathy    hands   OSA (obstructive sleep apnea)    does not use CPAP   Pneumonia    "couple of times"    Family History  Problem Relation Age of Onset   Alcohol abuse Mother    Alcohol abuse Father    Depression Sister    Dementia Neg Hx     Past Surgical History:  Procedure Laterality Date   ABDOMINAL SURGERY     APPENDECTOMY     BACK SURGERY     BREAST REDUCTION SURGERY     with lift   CARPAL TUNNEL RELEASE Bilateral    CESAREAN SECTION     CHOLECYSTECTOMY     COLONOSCOPY WITH PROPOFOL      FLEXOR  TENOTOMY  Right 04/07/2022   Procedure: BRACHIORADIALIS RELEASE;  Surgeon: Brunilda Capra, MD;  Location: [facility] OR;  Service: Orthopedics;  Laterality: Right;   HERNIA REPAIR     JOINT REPLACEMENT Left    knee   NECK SURGERY     with rod   OPEN REDUCTION INTERNAL FIXATION (ORIF) DISTAL RADIAL FRACTURE Right 04/07/2022   Procedure: OPEN REDUCTION INTERNAL FIXATION (ORIF) RIGHT DISTAL RADIUS FRACTURE;  Surgeon: Brunilda Capra, MD;  Location: [facility] OR;  Service: Orthopedics;  Laterality: Right;  75 MIN   ORIF CLAVICULAR FRACTURE Left 03/12/2024   Procedure: OPEN REDUCTION INTERNAL FIXATION (ORIF) CLAVICULAR FRACTURE;  Surgeon: Saundra Curl, MD;  Location: [facility];  Service: Orthopedics;  Laterality: Left;   ORIF CLAVICULAR FRACTURE Left 04/09/2024   Procedure: OPEN REDUCTION INTERNAL FIXATION (ORIF) CLAVICULAR FRACTURE REVISION WITH REMOVAL OF PREVIOUS HARDWARE;  Surgeon: Saundra Curl, MD;  Location: [facility] OR;  Service: Orthopedics;  Laterality: Left;   TIBIA IM NAIL INSERTION Right 11/12/2022   Procedure: INTRAMEDULLARY (IM) NAIL TIBIAL;  Surgeon: Ali Ink, MD;  Location: [facility] OR;  Service: Orthopedics;  Laterality: Right;   TONSILLECTOMY     TOOTH EXTRACTION     with sedation   UPPER GI ENDOSCOPY     Social History   Occupational History   Not on file  Tobacco Use   Smoking status: Former    Current packs/day: 0.00    Types: Cigarettes    Quit date: 01/17/2017    Years since quitting: 7.3   Smokeless tobacco: Never  Vaping Use   Vaping status: Never Used  Substance and Sexual Activity   Alcohol use: No    Comment: last use 2-3 years ago   Drug use: No   Sexual activity: Not Currently    Birth control/protection: Post-menopausal

## 2024-05-13 NOTE — Progress Notes (Signed)
 PCP - Emaline Handsome, MD  Cardiologist -   PPM/ICD - denies Device Orders - n/a Rep Notified - n/a  Chest x-ray - 02-22-24 EKG -  Stress Test -  ECHO - 09-23-16 Cardiac Cath -   CPAP - unable to tolerate  DM- denies  Blood Thinner Instructions: denies Aspirin  Instructions: n/a  ERAS Protcol - Clear liquids until 7:05 am.  COVID TEST- n/a  Anesthesia review: Yes Hx of Asthma, CKD, HTN, COPD, OSA  Patient verbally denies any shortness of breath, fever, cough and chest pain during phone call   -------------  SDW INSTRUCTIONS given:  Your procedure is scheduled on May 15, 2024.  Report to [facility] Main Entrance "A" at 8:20 A.M., and check in at the Admitting office.  Call this number if you have problems the morning of surgery:  [phone]   Remember:  Do not eat after midnight the night before your surgery  You may drink clear liquids until 7:50 the morning of your surgery.   Clear liquids allowed are: Water , Non-Citrus Juices (without pulp), Carbonated Beverages, Clear Tea, Black Coffee Only, and Gatorade    Take these medicines the morning of surgery with A SIP OF WATER   acetaminophen  (TYLENOL )  albuterol  (PROVENTIL )  nebulizer solution   albuterol  (VENTOLIN  HFA) inhaler  atorvastatin (LIPITOR)   carboxymethylcellulose (REFRESH PLUS)   DULoxetine (CYMBALTA)   hydrOXYzine  (ATARAX )   morphine  (MS CONTIN )   oxyCODONE -acetaminophen  (PERCOCET)   OXYGEN    pantoprazole  (PROTONIX )   promethazine  (PHENERGAN )   traMADol  (ULTRAM )  tiZANidine  (ZANAFLEX )      As of today, STOP taking any Aspirin  (unless otherwise instructed by your surgeon) Aleve, Naproxen, Ibuprofen, Motrin, Advil, Goody's, BC's, all herbal medications, fish oil, and all vitamins.  THIS INCLUDES YOUR meloxicam  (MOBIC ).                      Do not wear jewelry, make up, or nail polish            Do not wear lotions, powders, perfumes/colognes, or deodorant.            Do not shave 48 hours  prior to surgery.  Men may shave face and neck.            Do not bring valuables to the hospital.            [facility] is not responsible for any belongings or valuables.  Do NOT Smoke (Tobacco/Vaping) 24 hours prior to your procedure If you use a CPAP at night, you may bring all equipment for your overnight stay.   Contacts, glasses, dentures or bridgework may not be worn into surgery.      For patients admitted to the hospital, discharge time will be determined by your treatment team.   Patients discharged the day of surgery will not be allowed to drive home, and someone needs to stay with them for 24 hours.    Special instructions:   [place]- Preparing For Surgery  Before surgery, you can play an important role. Because skin is not sterile, your skin needs to be as free of germs as possible. You can reduce the number of germs on your skin by washing with CHG (chlorahexidine gluconate) Soap before surgery.  CHG is an antiseptic cleaner which kills germs and bonds with the skin to continue killing germs even after washing.    Oral Hygiene is also important to reduce your risk of infection.  Remember - BRUSH  YOUR TEETH THE MORNING OF SURGERY WITH YOUR REGULAR TOOTHPASTE  Please do not use if you have an allergy to CHG or antibacterial soaps. If your skin becomes reddened/irritated stop using the CHG.  Do not shave (including legs and underarms) for at least 48 hours prior to first CHG shower. It is OK to shave your face.  Please follow these instructions carefully.   Shower the NIGHT BEFORE SURGERY and the MORNING OF SURGERY with DIAL Soap.   Pat yourself dry with a CLEAN TOWEL.  Wear CLEAN PAJAMAS to bed the night before surgery  Place CLEAN SHEETS on your bed the night of your first shower and DO NOT SLEEP WITH PETS.   Day of Surgery: Please shower morning of surgery  Wear Clean/Comfortable clothing the morning of surgery Do not apply any deodorants/lotions.    Remember to brush your teeth WITH YOUR REGULAR TOOTHPASTE.   Questions were answered. Patient verbalized understanding of instructions.

## 2024-05-14 ENCOUNTER — Other Ambulatory Visit: Payer: Self-pay

## 2024-05-14 ENCOUNTER — Other Ambulatory Visit: Payer: Self-pay | Admitting: Physician Assistant

## 2024-05-14 ENCOUNTER — Encounter (HOSPITAL_COMMUNITY): Payer: Self-pay | Admitting: Orthopedic Surgery

## 2024-05-14 NOTE — Progress Notes (Signed)
 Anesthesia Chart Review: SAME DAY WORK-UP  Case: [id number] Date/Time: 05/15/24 1035   Procedure: EXCISION, CLAVICLE, DISTAL, OPEN (Left) - LEFT CLAVICLE DEBRIDEMENT AND HARDWARE REMOVAL   Anesthesia type: Choice   Diagnosis: Retained orthopedic hardware [Z96.9]   Pre-op diagnosis: Infected Left Clavicle   [facility] / [facility]   Surgeons: Timothy Ford, MD       DISCUSSION: Patient is a 63 year old female scheduled for the above procedure. She had a mechanical fall on 02/22/24 resulting in a left clavicular fracture, s/p ORIF 03/12/24. S/p ORIF clavicular fracture revision with removal of previous hardware on 04/09/24 by Dr. Randal Bury for failed hardware. Tissue culture showed no growth at that time.  More recnt viist with Dr. 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   History includes former smoker (quit 01/17/17), HTN, OSA (does not use CPAP), COPD (as needed home O2 3L), asthma (moderate persistent), CKD (Stage II), Common Variable Immunodeficiency (CVID, s/p Gammagard)), GERD, fatty liver, neuropathy (hands), spinal surgery (C4-6 ACDF), ventral hernia repair (12/25/13), left clavicule fracture ( 02/22/24, s/p ORIF 03/12/24, s/p revision and removal of failed hardware 04/09/24), Bipolar disorder, anxiety.   She follows with pulmonology at [facility] for history of COPD and moderate persistent asthma.  Last visit 12/27/23 with Sandie Cross, PA. Solumedrol and given and chest CT ordered for worsening cough with wheezing. CT showed minimal emphysema, slightly improved RLL opacities, likely infectious or inflammatory. She was also re-referred  back to cardiology for intermittent chest pain since, patient did not schedule in October 2024 when initially referred. As above this was previously reviewed with anesthesiologist prior to left clavicular ORIF earlier this month. Prior anesthesia APP review also in April 2023 for history of recurrent chest pains dating back to at least 2019, with last visit 07/14/21 with Dr. Lethea Ravel. Symptoms felt likely noncardiac (ie, nonexertional, present > 1 year) based on prior work-up, although stress test have been ordered in the past but never done. Unremarkable echo in 2017. She was also referred to GI at that time and did ultimately get established at [facility] in [place] and treated for GERD, drug-induced constipation, gastroparesis.     Anesthesia team to evaluate on the day of surgery.     VS: Ht 5\' 5"  (1.651 m)   Wt 99.8 kg   BMI 36.61 kg/m  BP Readings from Last 3 Encounters:  04/09/24 132/67  03/12/24 115/88  02/22/24 (!) 135/98   Pulse Readings from Last 3 Encounters:  04/09/24 88  03/12/24 94  02/22/24 96     PROVIDERS: Emaline Handsome, MD is PCP  Donnice Gale, MD is pulmonologist Durrell Gilles, MD is psychiatrist Darene Economy, PA-C is Pain Management - Previous evaluation by cardiologist Amos Balint, MD on 07/14/21.     LABS: Most recent lab results in [facility] include: Lab Results  Component Value Date   WBC 6.3 04/09/2024   HGB 13.3 04/09/2024   HCT 40.9 04/09/2024   PLT 327 04/09/2024   GLUCOSE 117 (H) 04/09/2024   NA 138 04/09/2024   K 4.0 04/09/2024   CL 107 04/09/2024   CREATININE 1.22 (H) 04/09/2024   BUN 8 04/09/2024   CO2  20 (L) 04/09/2024     OTHER: Six minute walk test 12/27/23 ([facility]): Patient's resting O2 was 90%.  Patient's lowest O2 on exertion was 89%.  Total feet walked was 660 .    Respiratory Flow Volume Loop 09/15/23 ([facility]): IMPRESSION: There is a moderate obstructive ventilatory defect.    Spirometry  07/18/23 ([facility]): There is a moderate obstructive ventilatory defect.  Normal TLC.  Normal Diffusing Capacity.  There is no significant response to bronchodilators.    CPAP Titration Study 08/01/22 ([facility]): IMPRESSION: Patient here in room 5 for a CPAP study. Initial pressure was 5 cmh2o and titrated to 16 cmh2o then switched to bipap for high pressure and intolerance at 17/13 cmh2o increased to 19/15 cmh2o. Patient tolerated well. Fisher and Production designer, theatre/television/film Snore 1 NSR      IMAGES: CT C-spine 02/22/24: IMPRESSION: No evidence of acute intracranial or cervical spine injury.   CT Chest 01/06/24 ([facility]): Impression:  1. Minimal emphysema.  2.  Previously seen right lower lobe opacities have slightly improved and are likely infectious or inflammatory.      EKG: 02/22/24: Sinus rhythm Consider anterior infarct Confirmed by Mozell Arias [phone]) on 02/22/2024 3:51:37 PM     CV: Echo 09/23/16 ([facility]): Interpretation Summary  A complete portable two-dimensional transthoracic echocardiogram with color  flow Doppler and Spectral Doppler was performed. The left ventricle is  normal in size.  There is normal left ventricular wall thickness.  The left ventricular ejection fraction is normal (60-65%).  The left ventricular wall motion is normal.  The aortic valve is not well visualized, but is grossly normal.  Estimation of right ventricular systolic pressure is not possible.  The left ventricular diastolic function is normal.   Past Medical History:  Diagnosis Date   Anxiety    Arthritis    Asthma    Chronic kidney disease    stage 2   COPD (chronic obstructive pulmonary disease) (HCC)    Depression    Dyspnea    On 3L oxygen  when laying down   Fatty liver 07/23/2018   Fibromyalgia    GERD (gastroesophageal reflux disease)    Headache    HSV infection    Hypertension    Manic depression (HCC)    Neuropathy    hands   OSA (obstructive sleep apnea)     does not use CPAP   Pneumonia    "couple of times"    Past Surgical History:  Procedure Laterality Date   ABDOMINAL SURGERY     APPENDECTOMY     BACK SURGERY     BREAST REDUCTION SURGERY     with lift   CARPAL TUNNEL RELEASE Bilateral    CESAREAN SECTION     CHOLECYSTECTOMY     COLONOSCOPY WITH PROPOFOL      FLEXOR TENOTOMY  Right 04/07/2022   Procedure: BRACHIORADIALIS RELEASE;  Surgeon: Brunilda Capra, MD;  [facility];  Service: Orthopedics;  Laterality: Right;   HERNIA REPAIR     JOINT REPLACEMENT Left    knee   NECK SURGERY     with rod   OPEN REDUCTION INTERNAL FIXATION (ORIF) DISTAL RADIAL FRACTURE Right 04/07/2022   Procedure: OPEN REDUCTION INTERNAL FIXATION (ORIF) RIGHT DISTAL RADIUS FRACTURE;  Surgeon: Brunilda Capra, MD;  [facility];  Service: Orthopedics;  Laterality: Right;  75 MIN   ORIF CLAVICULAR FRACTURE Left 03/12/2024   Procedure: OPEN REDUCTION INTERNAL FIXATION (ORIF) CLAVICULAR FRACTURE;  Surgeon: Saundra Curl, MD;  Location: [facility];  Service: Orthopedics;  Laterality: Left;   ORIF CLAVICULAR FRACTURE Left 04/09/2024   Procedure: OPEN REDUCTION INTERNAL FIXATION (ORIF) CLAVICULAR FRACTURE REVISION WITH REMOVAL OF PREVIOUS HARDWARE;  Surgeon: Saundra Curl, MD;  [facility];  Service: Orthopedics;  Laterality: Left;   TIBIA IM NAIL INSERTION Right 11/12/2022   Procedure: INTRAMEDULLARY (IM) NAIL TIBIAL;  Surgeon: Ali Ink, MD;  [facility];  Service: Orthopedics;  Laterality: Right;   TONSILLECTOMY     TOOTH EXTRACTION     with sedation   UPPER GI ENDOSCOPY      MEDICATIONS: No current facility-administered medications for this encounter.    acetaminophen  (TYLENOL ) 500 MG tablet   atorvastatin (LIPITOR) 20 MG tablet   DULoxetine (CYMBALTA) 60 MG capsule   gabapentin  (NEURONTIN ) 600 MG tablet   hydrOXYzine  (ATARAX ) 50 MG tablet   lamoTRIgine (LAMICTAL) 100 MG tablet   montelukast  (SINGULAIR ) 10 MG tablet   pantoprazole   (PROTONIX ) 40 MG tablet   prazosin  (MINIPRESS ) 5 MG capsule   acyclovir ointment (ZOVIRAX) 5 %   albuterol  (PROVENTIL ) (2.5 MG/3ML) 0.083% nebulizer solution   albuterol  (VENTOLIN  HFA) 108 (90 Base) MCG/ACT inhaler   bisacodyl  (DULCOLAX) 10 MG suppository   carboxymethylcellulose (REFRESH PLUS) 0.5 % SOLN   cephALEXin (KEFLEX) 500 MG capsule   diazepam  (VALIUM ) 10 MG tablet   EPINEPHrine  0.3 mg/0.3 mL IJ SOAJ injection   Immune Globulin 10% (GAMMAGARD) 10 GM/100ML SOLN   LINZESS 145 MCG CAPS capsule   lisinopril (ZESTRIL) 10 MG tablet   morphine  (MS CONTIN ) 30 MG 12 hr tablet   NYSTATIN  powder   oxyCODONE -acetaminophen  (PERCOCET) 10-325 MG tablet   OXYGEN    promethazine  (PHENERGAN ) 12.5 MG tablet   QUEtiapine  (SEROQUEL ) 300 MG tablet   tiZANidine  (ZANAFLEX ) 4 MG tablet   traMADol  (ULTRAM ) 50 MG tablet    Ella Gun, PA-C Surgical Short Stay/Anesthesiology [facility] Phone [phone] [facility] Phone [phone] 05/14/2024 10:22 AM

## 2024-05-14 NOTE — Anesthesia Preprocedure Evaluation (Signed)
 Anesthesia Evaluation  Patient identified by MRN, date of birth, ID band Patient awake    Reviewed: Allergy & Precautions, NPO status , Patient's Chart, lab work & pertinent test results  Airway Mallampati: III  TM Distance: >3 FB Neck ROM: Full    Dental  (+) Dental Advisory Given, Missing   Pulmonary shortness of breath, lying and Long-Term Oxygen  Therapy, asthma , sleep apnea (noncompliant w/ cpap, unable to tolerate) , COPD (3L [place]),  oxygen  dependent, former smoker   Pulmonary exam normal breath sounds clear to auscultation       Cardiovascular hypertension, Pt. on medications Normal cardiovascular exam Rhythm:Regular Rate:Normal     Neuro/Psych  Headaches PSYCHIATRIC DISORDERS Anxiety Depression Bipolar Disorder      GI/Hepatic Neg liver ROS,GERD  Controlled and Medicated,,  Endo/Other  negative endocrine ROS  Obesity   Renal/GU Renal InsufficiencyRenal disease  negative genitourinary   Musculoskeletal  (+) Arthritis , Osteoarthritis,  Fibromyalgia -, narcotic dependentInfected Left Clavicle   Abdominal   Peds  Hematology negative hematology ROS (+)   Anesthesia Other Findings Day of surgery medications reviewed with the patient.  Reproductive/Obstetrics negative OB ROS                              Anesthesia Physical Anesthesia Plan  ASA: 4  Anesthesia Plan: General   Post-op Pain Management: Tylenol  PO (pre-op)*   Induction: Intravenous  PONV Risk Score and Plan: 3 and Ondansetron , Dexamethasone , Treatment may vary due to age or medical condition and Midazolam   Airway Management Planned: Oral ETT and Video Laryngoscope Planned  Additional Equipment: None  Intra-op Plan:   Post-operative Plan: Extubation in OR  Informed Consent: I have reviewed the patients History and Physical, chart, labs and discussed the procedure including the risks, benefits and alternatives for  the proposed anesthesia with the patient or authorized representative who has indicated his/her understanding and acceptance.     Dental advisory given  Plan Discussed with: CRNA  Anesthesia Plan Comments: (PAT note written 05/14/2024 by Bricyn Labrada, PA-C.  )         Anesthesia Quick Evaluation

## 2024-05-14 NOTE — H&P (Signed)
 Deanna Yang is an 63 y.o. female.   Chief Complaint: Left clavicular pain with infection HPI: Patient is a 63 year old woman who was seen for initial evaluation referral from Dr. Abigail Abler. Patient has undergone open reduction internal fixation of the left clavicle x 2. Patient states she has redness and pain and she states that her symptoms seem consistent when she had abdominal surgery. Patient states she developed sepsis from this abdominal surgery.   Past Medical History:  Diagnosis Date   Anxiety    Arthritis    Asthma    Chronic kidney disease    stage 2   COPD (chronic obstructive pulmonary disease) (HCC)    Depression    Dyspnea    On 3L oxygen  when laying down   Fatty liver 07/23/2018   Fibromyalgia    GERD (gastroesophageal reflux disease)    Headache    HSV infection    Hypertension    Manic depression (HCC)    Neuropathy    hands   OSA (obstructive sleep apnea)    does not use CPAP   Pneumonia    "couple of times"    Past Surgical History:  Procedure Laterality Date   ABDOMINAL SURGERY     APPENDECTOMY     BACK SURGERY     BREAST REDUCTION SURGERY     with lift   CARPAL TUNNEL RELEASE Bilateral    CESAREAN SECTION     CHOLECYSTECTOMY     COLONOSCOPY WITH PROPOFOL      FLEXOR TENOTOMY  Right 04/07/2022   Procedure: BRACHIORADIALIS RELEASE;  Surgeon: Brunilda Capra, MD;  [facility];  Service: Orthopedics;  Laterality: Right;   HERNIA REPAIR     JOINT REPLACEMENT Left    knee   NECK SURGERY     with rod   OPEN REDUCTION INTERNAL FIXATION (ORIF) DISTAL RADIAL FRACTURE Right 04/07/2022   Procedure: OPEN REDUCTION INTERNAL FIXATION (ORIF) RIGHT DISTAL RADIUS FRACTURE;  Surgeon: Brunilda Capra, MD;  [facility];  Service: Orthopedics;  Laterality: Right;  75 MIN   ORIF CLAVICULAR FRACTURE Left 03/12/2024   Procedure: OPEN REDUCTION INTERNAL FIXATION (ORIF) CLAVICULAR FRACTURE;  Surgeon: Saundra Curl, MD;  [facility];  Service: Orthopedics;   Laterality: Left;   ORIF CLAVICULAR FRACTURE Left 04/09/2024   Procedure: OPEN REDUCTION INTERNAL FIXATION (ORIF) CLAVICULAR FRACTURE REVISION WITH REMOVAL OF PREVIOUS HARDWARE;  Surgeon: Saundra Curl, MD;  [facility];  Service: Orthopedics;  Laterality: Left;   TIBIA IM NAIL INSERTION Right 11/12/2022   Procedure: INTRAMEDULLARY (IM) NAIL TIBIAL;  Surgeon: Ali Ink, MD;  [facility];  Service: Orthopedics;  Laterality: Right;   TONSILLECTOMY     TOOTH EXTRACTION     with sedation   UPPER GI ENDOSCOPY      Family History  Problem Relation Age of Onset   Alcohol abuse Mother    Alcohol abuse Father    Depression Sister    Dementia Neg Hx    Social History:  reports that she quit smoking about 7 years ago. Her smoking use included cigarettes. She has never used smokeless tobacco. She reports that she does not drink alcohol and does not use drugs.  Allergies:  Allergies  Allergen Reactions   Ambien [Zolpidem Tartrate] Shortness Of Breath and Swelling    Tongue swelling, throat swelling.    Beta Adrenergic Blockers Anaphylaxis   Fish Allergy Shortness Of Breath and Rash   Iodinated Contrast Media Itching    Benadryl  50MG  prophylaxis  before and after and patient reports she did fine   Latex Itching and Other (See Comments)    Blisters skin   Metoprolol  Other (See Comments)    ANY MEDICATION THAT ENDS IN -OLOL-   The patient is asthmatic.   Penicillins Shortness Of Breath, Rash and Other (See Comments)        Red Dye #40 (Allura Red) Hives and Shortness Of Breath    Contrast    Shellfish Allergy Anaphylaxis   Sulfa Antibiotics Hives and Shortness Of Breath   Amoxicillin-Pot Clavulanate Nausea And Vomiting   Aspirin  Other (See Comments)    Blisters on tongue from higher doses - tolerates 81 mg    Dye Fdc Red  [Red Dye #2 (Amaranth)] Hives    X ray DYE  (BENADRYL  50 MG PROPHYLAXIS AND AFTER AND  SHE DID FINE, PER PATIENT.).   Tape Dermatitis and Rash     Adhesive    Talwin [Pentazocine] Other (See Comments)    Unknown reaction   Levofloxacin  Rash and Other (See Comments)    headache    No medications prior to admission.    No results found for this or any previous visit (from the past 48 hours). No results found.  Review of Systems  All other systems reviewed and are negative.   Height 5\' 5"  (1.651 m), weight 99.8 kg. Physical Exam  Patient is alert, oriented, no adenopathy, well-dressed, normal affect, normal respiratory effort. Examination patient's left upper extremity is neurovascular intact.  There is cellulitis around the clavicle incision there is no drainage this is tender to palpation.  Review of the radiographs shows destructive bony changes of the clavicle.  The screws on the medial aspect of the plate have lost their purchase and are free-floating.  Assessment/Plan Visit Diagnoses:  1. Acute osteomyelitis of clavicle, left (HCC)       Plan: Will plan for surgical intervention with removal of the deep retained hardware removal of infected bone and placement of vancomycin  powder and Kerecis tissue graft with negative pressure wound therapy with a hydrophilic sponge.  Plan to return to the operating room on Friday.  Will send tissue for cultures anticipate 6 weeks of postoperative antibiotics based on culture sensitivities.   Follow-Up Instructions: Return in about 2 weeks (around 05/27/2024).     Rocky Cipro, PA-C 05/14/2024, 1:47 PM

## 2024-05-14 NOTE — Progress Notes (Signed)
 [facility]     Patient seen in office on 05/13/24 by Dr. Julio Ohm CC:  Left Shoulder - Routine Post Op      Pt is s/p 2 surgeries with Dr. Abigail Abler in April for ORIF clavicle fracture.   Pre-op H & P to follow.   Rocky Cipro PA-C

## 2024-05-15 ENCOUNTER — Encounter (HOSPITAL_COMMUNITY): Payer: Self-pay | Admitting: Orthopedic Surgery

## 2024-05-15 ENCOUNTER — Ambulatory Visit (HOSPITAL_COMMUNITY): Admitting: Vascular Surgery

## 2024-05-15 ENCOUNTER — Inpatient Hospital Stay (HOSPITAL_COMMUNITY)
Admission: AD | Admit: 2024-05-15 | Discharge: 2024-05-21 | DRG: 464 | Disposition: A | Discharge status: Home Health | Attending: Orthopedic Surgery | Admitting: Orthopedic Surgery

## 2024-05-15 ENCOUNTER — Encounter (HOSPITAL_COMMUNITY)
Admission: AD | Disposition: A | Discharge status: Home Health | Payer: Self-pay | Source: Home / Self Care | Attending: Orthopedic Surgery

## 2024-05-15 ENCOUNTER — Other Ambulatory Visit: Payer: Self-pay

## 2024-05-15 DIAGNOSIS — Z91041 Radiographic dye allergy status: Secondary | ICD-10-CM

## 2024-05-15 DIAGNOSIS — Z87891 Personal history of nicotine dependence: Secondary | ICD-10-CM | POA: Diagnosis not present

## 2024-05-15 DIAGNOSIS — K3184 Gastroparesis: Secondary | ICD-10-CM | POA: Diagnosis present

## 2024-05-15 DIAGNOSIS — J4540 Moderate persistent asthma, uncomplicated: Secondary | ICD-10-CM | POA: Diagnosis present

## 2024-05-15 DIAGNOSIS — F319 Bipolar disorder, unspecified: Secondary | ICD-10-CM | POA: Diagnosis present

## 2024-05-15 DIAGNOSIS — I129 Hypertensive chronic kidney disease with stage 1 through stage 4 chronic kidney disease, or unspecified chronic kidney disease: Secondary | ICD-10-CM | POA: Diagnosis present

## 2024-05-15 DIAGNOSIS — T847XXA Infection and inflammatory reaction due to other internal orthopedic prosthetic devices, implants and grafts, initial encounter: Secondary | ICD-10-CM | POA: Diagnosis not present

## 2024-05-15 DIAGNOSIS — Z5941 Food insecurity: Secondary | ICD-10-CM | POA: Diagnosis not present

## 2024-05-15 DIAGNOSIS — F418 Other specified anxiety disorders: Secondary | ICD-10-CM

## 2024-05-15 DIAGNOSIS — T8469XA Infection and inflammatory reaction due to internal fixation device of other site, initial encounter: Secondary | ICD-10-CM | POA: Diagnosis present

## 2024-05-15 DIAGNOSIS — K219 Gastro-esophageal reflux disease without esophagitis: Secondary | ICD-10-CM | POA: Diagnosis present

## 2024-05-15 DIAGNOSIS — T84298A Other mechanical complication of internal fixation device of other bones, initial encounter: Secondary | ICD-10-CM | POA: Diagnosis present

## 2024-05-15 DIAGNOSIS — I10 Essential (primary) hypertension: Secondary | ICD-10-CM

## 2024-05-15 DIAGNOSIS — Y831 Surgical operation with implant of artificial internal device as the cause of abnormal reaction of the patient, or of later complication, without mention of misadventure at the time of the procedure: Secondary | ICD-10-CM | POA: Diagnosis present

## 2024-05-15 DIAGNOSIS — B9689 Other specified bacterial agents as the cause of diseases classified elsewhere: Secondary | ICD-10-CM | POA: Diagnosis not present

## 2024-05-15 DIAGNOSIS — J4489 Other specified chronic obstructive pulmonary disease: Secondary | ICD-10-CM | POA: Diagnosis present

## 2024-05-15 DIAGNOSIS — N1831 Chronic kidney disease, stage 3a: Secondary | ICD-10-CM | POA: Diagnosis not present

## 2024-05-15 DIAGNOSIS — Z79899 Other long term (current) drug therapy: Secondary | ICD-10-CM

## 2024-05-15 DIAGNOSIS — M797 Fibromyalgia: Secondary | ICD-10-CM | POA: Diagnosis present

## 2024-05-15 DIAGNOSIS — Z91040 Latex allergy status: Secondary | ICD-10-CM

## 2024-05-15 DIAGNOSIS — J449 Chronic obstructive pulmonary disease, unspecified: Secondary | ICD-10-CM

## 2024-05-15 DIAGNOSIS — B999 Unspecified infectious disease: Principal | ICD-10-CM | POA: Diagnosis present

## 2024-05-15 DIAGNOSIS — Z472 Encounter for removal of internal fixation device: Secondary | ICD-10-CM | POA: Diagnosis not present

## 2024-05-15 DIAGNOSIS — N183 Chronic kidney disease, stage 3 unspecified: Secondary | ICD-10-CM | POA: Diagnosis present

## 2024-05-15 DIAGNOSIS — M86112 Other acute osteomyelitis, left shoulder: Secondary | ICD-10-CM | POA: Diagnosis present

## 2024-05-15 HISTORY — PX: RESECTION DISTAL CLAVICAL: SHX5053

## 2024-05-15 LAB — BASIC METABOLIC PANEL WITH GFR
Anion gap: 9 (ref 5–15)
BUN: 10 mg/dL (ref 8–23)
CO2: 22 mmol/L (ref 22–32)
Calcium: 8.3 mg/dL — ABNORMAL LOW (ref 8.9–10.3)
Chloride: 106 mmol/L (ref 98–111)
Creatinine, Ser: 1.45 mg/dL — ABNORMAL HIGH (ref 0.44–1.00)
GFR, Estimated: 41 mL/min — ABNORMAL LOW (ref >60)
Glucose, Bld: 123 mg/dL — ABNORMAL HIGH (ref 70–99)
Potassium: 3.6 mmol/L (ref 3.5–5.1)
Sodium: 137 mmol/L (ref 135–145)

## 2024-05-15 LAB — CBC
HCT: 36 % (ref 36.0–46.0)
Hemoglobin: 11.6 g/dL — ABNORMAL LOW (ref 12.0–15.0)
MCH: 28.3 pg (ref 26.0–34.0)
MCHC: 32.2 g/dL (ref 30.0–36.0)
MCV: 87.8 fL (ref 80.0–100.0)
Platelets: 328 10*3/uL (ref 150–400)
RBC: 4.1 MIL/uL (ref 3.87–5.11)
RDW: 13.9 % (ref 11.5–15.5)
WBC: 7.6 10*3/uL (ref 4.0–10.5)
nRBC: 0 % (ref 0.0–0.2)

## 2024-05-15 SURGERY — EXCISION, CLAVICLE, DISTAL, OPEN
Anesthesia: General | Site: Shoulder | Laterality: Left

## 2024-05-15 MED ORDER — LIDOCAINE 2% (20 MG/ML) 5 ML SYRINGE
INTRAMUSCULAR | Status: DC | PRN
  Administered 2024-05-15: 100 mg via INTRAVENOUS

## 2024-05-15 MED ORDER — ACETAMINOPHEN 500 MG PO TABS
1000.0000 mg | ORAL_TABLET | Freq: Once | ORAL | Status: AC
  Administered 2024-05-15: 1000 mg via ORAL
  Filled 2024-05-15: qty 2

## 2024-05-15 MED ORDER — PROMETHAZINE HCL 25 MG PO TABS
25.0000 mg | ORAL_TABLET | Freq: Three times a day (TID) | ORAL | Status: DC | PRN
  Administered 2024-05-17: 25 mg via ORAL
  Filled 2024-05-15 (×2): qty 1

## 2024-05-15 MED ORDER — TIZANIDINE HCL 4 MG PO TABS
8.0000 mg | ORAL_TABLET | Freq: Three times a day (TID) | ORAL | Status: DC | PRN
  Administered 2024-05-16 – 2024-05-21 (×8): 8 mg via ORAL
  Filled 2024-05-15 (×8): qty 2

## 2024-05-15 MED ORDER — BISACODYL 5 MG PO TBEC: 5.0000 mg | DELAYED_RELEASE_TABLET | Freq: Every day | ORAL | Status: DC | PRN

## 2024-05-15 MED ORDER — ROCURONIUM BROMIDE 10 MG/ML (PF) SYRINGE
PREFILLED_SYRINGE | INTRAVENOUS | Status: DC | PRN
  Administered 2024-05-15: 60 mg via INTRAVENOUS

## 2024-05-15 MED ORDER — LACTATED RINGERS IV SOLN: INTRAVENOUS | Status: DC

## 2024-05-15 MED ORDER — VANCOMYCIN HCL 1000 MG IV SOLR
INTRAVENOUS | Status: AC
  Filled 2024-05-15: qty 20

## 2024-05-15 MED ORDER — FLEET ENEMA RE ENEM
1.0000 | ENEMA | Freq: Once | RECTAL | Status: DC | PRN
Start: 2024-05-15 — End: 2024-05-21

## 2024-05-15 MED ORDER — ORAL CARE MOUTH RINSE
15.0000 mL | Freq: Once | OROMUCOSAL | Status: AC
  Administered 2024-05-15: 15 mL via OROMUCOSAL

## 2024-05-15 MED ORDER — VANCOMYCIN HCL 1000 MG IV SOLR
INTRAVENOUS | Status: DC | PRN
  Administered 2024-05-15: 1000 mg via TOPICAL

## 2024-05-15 MED ORDER — MONTELUKAST SODIUM 10 MG PO TABS
10.0000 mg | ORAL_TABLET | Freq: Every day | ORAL | Status: DC
  Administered 2024-05-15 – 2024-05-20 (×6): 10 mg via ORAL
  Filled 2024-05-15 (×6): qty 1

## 2024-05-15 MED ORDER — ATORVASTATIN CALCIUM 10 MG PO TABS
20.0000 mg | ORAL_TABLET | Freq: Every day | ORAL | Status: DC
  Administered 2024-05-16 – 2024-05-19 (×4): 20 mg via ORAL
  Filled 2024-05-15 (×4): qty 2

## 2024-05-15 MED ORDER — ALBUTEROL SULFATE (2.5 MG/3ML) 0.083% IN NEBU
2.5000 mg | INHALATION_SOLUTION | RESPIRATORY_TRACT | Status: DC | PRN
  Administered 2024-05-16 – 2024-05-17 (×2): 2.5 mg via RESPIRATORY_TRACT
  Filled 2024-05-15 (×2): qty 3

## 2024-05-15 MED ORDER — DULOXETINE HCL 60 MG PO CPEP
60.0000 mg | ORAL_CAPSULE | Freq: Two times a day (BID) | ORAL | Status: DC
  Administered 2024-05-15 – 2024-05-21 (×12): 60 mg via ORAL
  Filled 2024-05-15 (×12): qty 1

## 2024-05-15 MED ORDER — FENTANYL CITRATE (PF) 100 MCG/2ML IJ SOLN
INTRAMUSCULAR | Status: AC
  Filled 2024-05-15: qty 2

## 2024-05-15 MED ORDER — SODIUM CHLORIDE 0.9 % IV SOLN: INTRAVENOUS | Status: AC

## 2024-05-15 MED ORDER — GABAPENTIN 300 MG PO CAPS
1800.0000 mg | ORAL_CAPSULE | Freq: Every day | ORAL | Status: DC
  Administered 2024-05-15 – 2024-05-20 (×6): 1800 mg via ORAL
  Filled 2024-05-15 (×6): qty 6

## 2024-05-15 MED ORDER — LACTATED RINGERS IV SOLN: INTRAVENOUS | Status: DC | PRN

## 2024-05-15 MED ORDER — ONDANSETRON HCL 4 MG/2ML IJ SOLN
INTRAMUSCULAR | Status: DC | PRN
  Administered 2024-05-15: 4 mg via INTRAVENOUS

## 2024-05-15 MED ORDER — HYDROMORPHONE HCL 1 MG/ML IJ SOLN
0.5000 mg | INTRAMUSCULAR | Status: DC | PRN
  Administered 2024-05-15 – 2024-05-21 (×10): 1 mg via INTRAVENOUS
  Filled 2024-05-15 (×11): qty 1

## 2024-05-15 MED ORDER — EPINEPHRINE 0.3 MG/0.3ML IJ SOAJ: 0.3000 mg | INTRAMUSCULAR | Status: DC | PRN

## 2024-05-15 MED ORDER — METOCLOPRAMIDE HCL 5 MG/ML IJ SOLN: 5.0000 mg | Freq: Three times a day (TID) | INTRAMUSCULAR | Status: DC | PRN

## 2024-05-15 MED ORDER — QUETIAPINE FUMARATE 100 MG PO TABS
300.0000 mg | ORAL_TABLET | Freq: Every day | ORAL | Status: DC
  Administered 2024-05-15 – 2024-05-20 (×6): 300 mg via ORAL
  Filled 2024-05-15 (×6): qty 3

## 2024-05-15 MED ORDER — NYSTATIN 100000 UNIT/GM EX POWD: 1.0000 | Freq: Three times a day (TID) | CUTANEOUS | Status: DC | PRN

## 2024-05-15 MED ORDER — MIDAZOLAM HCL 2 MG/2ML IJ SOLN
INTRAMUSCULAR | Status: AC
Start: 2024-05-15 — End: ?
  Filled 2024-05-15: qty 2

## 2024-05-15 MED ORDER — CHLORHEXIDINE GLUCONATE 0.12 % MT SOLN: 15.0000 mL | Freq: Once | OROMUCOSAL | Status: DC

## 2024-05-15 MED ORDER — ACETAMINOPHEN 325 MG PO TABS: 325.0000 mg | ORAL_TABLET | Freq: Four times a day (QID) | ORAL | Status: DC | PRN

## 2024-05-15 MED ORDER — PRAZOSIN HCL 2 MG PO CAPS
5.0000 mg | ORAL_CAPSULE | Freq: Every day | ORAL | Status: DC
  Administered 2024-05-15 – 2024-05-20 (×5): 5 mg via ORAL
  Filled 2024-05-15 (×7): qty 1

## 2024-05-15 MED ORDER — VANCOMYCIN HCL 1500 MG/300ML IV SOLN
1500.0000 mg | Freq: Once | INTRAVENOUS | Status: AC
  Administered 2024-05-15: 1500 mg via INTRAVENOUS
  Filled 2024-05-15: qty 300

## 2024-05-15 MED ORDER — SUGAMMADEX SODIUM 200 MG/2ML IV SOLN
INTRAVENOUS | Status: DC | PRN
  Administered 2024-05-15: 200 mg via INTRAVENOUS

## 2024-05-15 MED ORDER — SODIUM CHLORIDE 0.9 % IV SOLN
2.0000 g | INTRAVENOUS | Status: DC
  Administered 2024-05-15 – 2024-05-18 (×4): 2 g via INTRAVENOUS
  Filled 2024-05-15 (×4): qty 20

## 2024-05-15 MED ORDER — PHENYLEPHRINE 80 MCG/ML (10ML) SYRINGE FOR IV PUSH (FOR BLOOD PRESSURE SUPPORT)
PREFILLED_SYRINGE | INTRAVENOUS | Status: DC | PRN
  Administered 2024-05-15: 80 ug via INTRAVENOUS

## 2024-05-15 MED ORDER — MORPHINE SULFATE ER 15 MG PO TBCR
30.0000 mg | EXTENDED_RELEASE_TABLET | Freq: Two times a day (BID) | ORAL | Status: DC
  Administered 2024-05-15 – 2024-05-21 (×12): 30 mg via ORAL
  Filled 2024-05-15 (×12): qty 2

## 2024-05-15 MED ORDER — MIDAZOLAM HCL 2 MG/2ML IJ SOLN
INTRAMUSCULAR | Status: DC | PRN
  Administered 2024-05-15: 2 mg via INTRAVENOUS

## 2024-05-15 MED ORDER — LAMOTRIGINE 100 MG PO TABS
100.0000 mg | ORAL_TABLET | Freq: Every day | ORAL | Status: DC
  Administered 2024-05-15 – 2024-05-20 (×6): 100 mg via ORAL
  Filled 2024-05-15 (×6): qty 1

## 2024-05-15 MED ORDER — AMISULPRIDE (ANTIEMETIC) 5 MG/2ML IV SOLN
10.0000 mg | Freq: Once | INTRAVENOUS | Status: DC | PRN
Start: 2024-05-15 — End: 2024-05-15

## 2024-05-15 MED ORDER — VANCOMYCIN HCL IN DEXTROSE 1-5 GM/200ML-% IV SOLN
1000.0000 mg | INTRAVENOUS | Status: DC
  Administered 2024-05-16 – 2024-05-18 (×3): 1000 mg via INTRAVENOUS
  Filled 2024-05-15 (×3): qty 200

## 2024-05-15 MED ORDER — LISINOPRIL 10 MG PO TABS
10.0000 mg | ORAL_TABLET | Freq: Every morning | ORAL | Status: DC
  Administered 2024-05-16 – 2024-05-21 (×5): 10 mg via ORAL
  Filled 2024-05-15 (×5): qty 1

## 2024-05-15 MED ORDER — OXYCODONE HCL 5 MG PO TABS
10.0000 mg | ORAL_TABLET | ORAL | Status: DC | PRN
  Administered 2024-05-15 – 2024-05-17 (×4): 15 mg via ORAL
  Administered 2024-05-18: 10 mg via ORAL
  Administered 2024-05-18 – 2024-05-21 (×5): 15 mg via ORAL
  Filled 2024-05-15 (×2): qty 3
  Filled 2024-05-15: qty 2
  Filled 2024-05-15 (×7): qty 3

## 2024-05-15 MED ORDER — OXYCODONE HCL 5 MG PO TABS
5.0000 mg | ORAL_TABLET | ORAL | Status: DC | PRN
  Administered 2024-05-19 – 2024-05-21 (×4): 10 mg via ORAL
  Filled 2024-05-15 (×4): qty 2

## 2024-05-15 MED ORDER — PANTOPRAZOLE SODIUM 40 MG PO TBEC
40.0000 mg | DELAYED_RELEASE_TABLET | Freq: Every day | ORAL | Status: DC
  Administered 2024-05-15 – 2024-05-21 (×7): 40 mg via ORAL
  Filled 2024-05-15 (×2): qty 1
  Filled 2024-05-15: qty 2
  Filled 2024-05-15 (×4): qty 1

## 2024-05-15 MED ORDER — ACETAMINOPHEN 500 MG PO TABS
1000.0000 mg | ORAL_TABLET | Freq: Four times a day (QID) | ORAL | Status: AC
  Administered 2024-05-15 – 2024-05-16 (×4): 1000 mg via ORAL
  Filled 2024-05-15 (×4): qty 2

## 2024-05-15 MED ORDER — ACYCLOVIR 5 % EX OINT: 1.0000 | TOPICAL_OINTMENT | CUTANEOUS | Status: DC | PRN

## 2024-05-15 MED ORDER — METOCLOPRAMIDE HCL 5 MG PO TABS: 5.0000 mg | ORAL_TABLET | Freq: Three times a day (TID) | ORAL | Status: DC | PRN

## 2024-05-15 MED ORDER — HYDROXYZINE HCL 25 MG PO TABS
50.0000 mg | ORAL_TABLET | Freq: Two times a day (BID) | ORAL | Status: DC | PRN
  Administered 2024-05-18 – 2024-05-19 (×2): 50 mg via ORAL
  Filled 2024-05-15 (×2): qty 2

## 2024-05-15 MED ORDER — VASHE WOUND IRRIGATION OPTIME
TOPICAL | Status: DC | PRN
  Administered 2024-05-15: 34 [oz_av]

## 2024-05-15 MED ORDER — CEFAZOLIN SODIUM-DEXTROSE 2-4 GM/100ML-% IV SOLN
2.0000 g | INTRAVENOUS | Status: AC
Start: 2024-05-15 — End: 2024-05-15
  Administered 2024-05-15: 2 g via INTRAVENOUS
  Filled 2024-05-15: qty 100

## 2024-05-15 MED ORDER — PROPOFOL 10 MG/ML IV BOLUS
INTRAVENOUS | Status: DC | PRN
  Administered 2024-05-15: 160 mg via INTRAVENOUS

## 2024-05-15 MED ORDER — POLYETHYLENE GLYCOL 3350 17 G PO PACK
17.0000 g | PACK | Freq: Every day | ORAL | Status: DC | PRN
  Administered 2024-05-19: 17 g via ORAL
  Filled 2024-05-15: qty 1

## 2024-05-15 MED ORDER — FENTANYL CITRATE (PF) 250 MCG/5ML IJ SOLN
INTRAMUSCULAR | Status: DC | PRN
  Administered 2024-05-15: 50 ug via INTRAVENOUS
  Administered 2024-05-15: 100 ug via INTRAVENOUS

## 2024-05-15 MED ORDER — FENTANYL CITRATE (PF) 100 MCG/2ML IJ SOLN
25.0000 ug | INTRAMUSCULAR | Status: DC | PRN
  Administered 2024-05-15 (×2): 50 ug via INTRAVENOUS

## 2024-05-15 MED ORDER — 0.9 % SODIUM CHLORIDE (POUR BTL) OPTIME
TOPICAL | Status: DC | PRN
  Administered 2024-05-15: 1000 mL

## 2024-05-15 MED ORDER — FENTANYL CITRATE (PF) 250 MCG/5ML IJ SOLN
INTRAMUSCULAR | Status: AC
  Filled 2024-05-15: qty 5

## 2024-05-15 SURGICAL SUPPLY — 46 items
BAG COUNTER SPONGE SURGICOUNT (BAG) ×1 IMPLANT
BENZOIN TINCTURE PRP APPL 2/3 (GAUZE/BANDAGES/DRESSINGS) ×1 IMPLANT
BLADE AVERAGE 25X9 (BLADE) IMPLANT
BLADE SURG 21 STRL SS (BLADE) IMPLANT
CANISTER WOUNDNEG PRESSURE 500 (CANNISTER) IMPLANT
CLEANSER WND VASHE 34 (WOUND CARE) IMPLANT
COVER SURGICAL LIGHT HANDLE (MISCELLANEOUS) ×3 IMPLANT
DRAPE IMP U-DRAPE 54X76 (DRAPES) ×1 IMPLANT
DRAPE INCISE IOBAN 66X45 STRL (DRAPES) ×1 IMPLANT
DRAPE SURG ORHT 6 SPLT 77X108 (DRAPES) ×1 IMPLANT
DRAPE U-SHAPE 47X51 STRL (DRAPES) ×2 IMPLANT
DRESSING VERAFLO CLEANS CC MED (GAUZE/BANDAGES/DRESSINGS) IMPLANT
DURAPREP 26ML APPLICATOR (WOUND CARE) ×1 IMPLANT
ELECT CAUTERY BLADE 6.4 (BLADE) ×1 IMPLANT
ELECT PENCIL ROCKER SW 15FT (MISCELLANEOUS) IMPLANT
ELECTRODE REM PT RTRN 9FT ADLT (ELECTROSURGICAL) ×1 IMPLANT
GAUZE PAD ABD 8X10 STRL (GAUZE/BANDAGES/DRESSINGS) ×1 IMPLANT
GAUZE SPONGE 4X4 12PLY STRL (GAUZE/BANDAGES/DRESSINGS) ×1 IMPLANT
GAUZE XEROFORM 1X8 LF (GAUZE/BANDAGES/DRESSINGS) ×1 IMPLANT
GLOVE BIO SURGEON ST LM GN SZ9 (GLOVE) ×1 IMPLANT
GLOVE BIOGEL PI IND STRL 9 (GLOVE) ×1 IMPLANT
GOWN STRL REUS W/ TWL XL LVL3 (GOWN DISPOSABLE) ×3 IMPLANT
GRAFT SKIN WND MICRO 38 (Tissue) IMPLANT
KIT BASIN OR (CUSTOM PROCEDURE TRAY) ×1 IMPLANT
KIT TURNOVER KIT B (KITS) ×1 IMPLANT
MANIFOLD NEPTUNE II (INSTRUMENTS) ×1 IMPLANT
NDL HYPO 25GX1X1/2 BEV (NEEDLE) IMPLANT
NEEDLE HYPO 25GX1X1/2 BEV (NEEDLE) IMPLANT
NS IRRIG 1000ML POUR BTL (IV SOLUTION) ×1 IMPLANT
PACK SHOULDER (CUSTOM PROCEDURE TRAY) ×1 IMPLANT
PACK UNIVERSAL I (CUSTOM PROCEDURE TRAY) ×1 IMPLANT
PAD ARMBOARD POSITIONER FOAM (MISCELLANEOUS) ×2 IMPLANT
SLING ARM IMMOBILIZER LRG (SOFTGOODS) IMPLANT
SPONGE T-LAP 4X18 ~~LOC~~+RFID (SPONGE) ×2 IMPLANT
STAPLER SKIN PROX 35W (STAPLE) IMPLANT
STRIP CLOSURE SKIN 1/4X4 (GAUZE/BANDAGES/DRESSINGS) ×1 IMPLANT
SUCTION TUBE FRAZIER 10FR DISP (SUCTIONS) ×1 IMPLANT
SUT ETHILON 2 0 PSLX (SUTURE) IMPLANT
SUT VIC AB 2-0 CT1 TAPERPNT 27 (SUTURE) ×1 IMPLANT
SUT VIC AB 4-0 PS2 18 (SUTURE) IMPLANT
SYR CONTROL 10ML LL (SYRINGE) IMPLANT
TOWEL GREEN STERILE (TOWEL DISPOSABLE) ×1 IMPLANT
TOWEL GREEN STERILE FF (TOWEL DISPOSABLE) ×1 IMPLANT
TUBE CONNECTING 12X1/4 (SUCTIONS) IMPLANT
WATER STERILE IRR 1000ML POUR (IV SOLUTION) ×1 IMPLANT
YANKAUER SUCT BULB TIP NO VENT (SUCTIONS) IMPLANT

## 2024-05-15 NOTE — Interval H&P Note (Signed)
 History and Physical Interval Note:  05/15/2024 7:59 AM  Deanna Yang  has presented today for surgery, with the diagnosis of Infected Left Clavicle.  The various methods of treatment have been discussed with the patient and family. After consideration of risks, benefits and other options for treatment, the patient has consented to  Procedure(s) with comments: EXCISION, CLAVICLE, DISTAL, OPEN (Left) - LEFT CLAVICLE DEBRIDEMENT AND HARDWARE REMOVAL as a surgical intervention.  The patient's history has been reviewed, patient examined, no change in status, stable for surgery.  I have reviewed the patient's chart and labs.  Questions were answered to the patient's satisfaction.     [name]

## 2024-05-15 NOTE — Progress Notes (Signed)
 Pharmacy Antibiotic Note  Deanna Yang is a 63 y.o. female admitted on 05/15/2024 with L-clavicle osteo/PJI s/p OR 6/4 for I&D and hardware removal.  Pharmacy has been consulted for Vancomycin  + Rocephin dosing.  Noted Bryan Caprio for Zosyn however PCN allergy that needs further clarification. She does appear to have tolerated cephalexin previously.   Cefazolin  2g IV x 1 given intra-op at 1145 + vancomycin  1g powder in the wound.   SCr 1.45 - up slightly from known BL 1-1.2, expect will likely trend down.  Plan: - Start Ceftriaxone 2g IV every 24 hours - Vancomycin  1500 mg IV x 1 dose followed by 1g IV every 24 hours (eAUC 424, SCr 1.45, Vd 0.72)  Height: 5\' 5"  (165.1 cm) Weight: 93.9 kg (207 lb) IBW/kg (Calculated) : 57  Temp (24hrs), Avg:97.9 F (36.6 C), Min:97.8 F (36.6 C), Max:98.1 F (36.7 C)  Recent Labs  Lab 05/15/24 0926  WBC 7.6  CREATININE 1.45*    Estimated Creatinine Clearance: 46.2 mL/min (A) (by C-G formula based on SCr of 1.45 mg/dL (H)).    Allergies  Allergen Reactions   Ambien [Zolpidem Tartrate] Shortness Of Breath and Swelling    Tongue swelling, throat swelling.    Beta Adrenergic Blockers Anaphylaxis   Fish Allergy Shortness Of Breath and Rash   Iodinated Contrast Media Itching    Benadryl  50MG  prophylaxis before and after and patient reports she did fine   Latex Itching and Other (See Comments)    Blisters skin   Metoprolol  Other (See Comments)    ANY MEDICATION THAT ENDS IN -OLOL-   The patient is asthmatic.   Penicillins Shortness Of Breath, Rash and Other (See Comments)        Red Dye #40 (Allura Red) Hives and Shortness Of Breath    Contrast    Shellfish Allergy Anaphylaxis   Sulfa Antibiotics Hives and Shortness Of Breath   Amoxicillin-Pot Clavulanate Nausea And Vomiting   Aspirin  Other (See Comments)    Blisters on tongue from higher doses - tolerates 81 mg    Dye Fdc Red  [Red Dye #2 (Amaranth)] Hives    X ray DYE  (BENADRYL  50 MG  PROPHYLAXIS AND AFTER AND  SHE DID FINE, PER PATIENT.).   Tape Dermatitis and Rash    Adhesive    Talwin [Pentazocine] Other (See Comments)    Unknown reaction   Levofloxacin  Rash and Other (See Comments)    headache    Antimicrobials this admission: Cefazolin  6/4 x 1 pre-op Vancomycin  6/4 >> Rocephin 6/4 >>  Dose adjustments this admission:   Microbiology results: 6/4 OR cx >>   Thank you for allowing pharmacy to be a part of this patient's care.  Garland Junk, PharmD, BCPS, BCIDP Infectious Diseases Clinical Pharmacist 05/15/2024 3:44 PM   **Pharmacist phone directory can now be found on [email or web address] ([email or web address]).  Listed under [facility].

## 2024-05-15 NOTE — Anesthesia Procedure Notes (Signed)
 Procedure Name: Intubation Date/Time: 05/15/2024 11:34 AM  Performed by: Katrinka Parr, CRNAPre-anesthesia Checklist: Patient identified, Emergency Drugs available, Suction available and Patient being monitored Patient Re-evaluated:Patient Re-evaluated prior to induction Oxygen  Delivery Method: Circle System Utilized Preoxygenation: Pre-oxygenation with 100% oxygen  Induction Type: IV induction Ventilation: Mask ventilation without difficulty Laryngoscope Size: Glidescope and 3 Grade View: Grade I Tube type: Oral Number of attempts: 1 Airway Equipment and Method: Stylet and Oral airway Placement Confirmation: ETT inserted through vocal cords under direct vision, positive ETCO2 and breath sounds checked- equal and bilateral Secured at: 21 cm Tube secured with: Tape Dental Injury: Teeth and Oropharynx as per pre-operative assessment

## 2024-05-15 NOTE — Op Note (Signed)
 05/15/2024  12:22 PM  PATIENT:  Deanna Yang    PRE-OPERATIVE DIAGNOSIS:  Infected Left Clavicle with failure of deep retained hardware.  POST-OPERATIVE DIAGNOSIS:  Same  PROCEDURE: Partial excision left clavicle for acute osteomyelitis. Removal of failed deep hardware complicating wound infection.  Application of wound VAC cleanse choice sponge x 1.  Application Kerecis micro graft 38 cm with 1 g vancomycin  powder.  SURGEON:  Timothy Ford, MD  PHYSICIAN ASSISTANT:None ANESTHESIA:   General  PREOPERATIVE INDICATIONS:  [name] is a  63 y.o. female with a diagnosis of Infected Left Clavicle who failed conservative measures and elected for surgical management.    The risks benefits and alternatives were discussed with the patient preoperatively including but not limited to the risks of infection, bleeding, nerve injury, cardiopulmonary complications, the need for revision surgery, among others, and the patient was willing to proceed.  OPERATIVE IMPLANTS:   Implant Name Type Inv. Item Serial No. Manufacturer Lot No. LRB No. Used Action  GRAFT SKIN WND MICRO 38 - [id number] Tissue GRAFT SKIN WND MICRO 38  [employer] [id number] Left 1 Implanted    @ENCIMAGES @  OPERATIVE FINDINGS: Infected FiberWire was removed as well as infected bone and soft tissue.  The hardware was removed without complications.  Tissue and bone sent for cultures.  OPERATIVE PROCEDURE: Patient was brought the operating room and underwent a general anesthetic.  After adequate levels anesthesia obtained patient's left upper extremity was prepped using DuraPrep draped into a sterile field a timeout was called.  An incision was made directly over the previous incision this was carried sharply down to hardware.  The plate and 8 screws were removed without complication.  None of the screws medially had any purchase in the bone or hardware.  There was infected FiberWire that was also removed.  After removal of  hardware a rondure were and periosteal elevator was used to remove bone soft tissue skin.  Tissue had good petechial bleeding after debridement.  The wound was irrigated with Vashe.  The wound bed was filled with 1 g vancomycin  powder and 38 cm of Kerecis micro graft.  A blue cleanse choice sponge was applied secured with derma tack this had a good suction fit patient was extubated taken the PACU in stable condition.   DISCHARGE PLANNING:  Antibiotic duration: Start Zosyn will consult infectious disease for long-term antibiotic coverage  Weightbearing: Not applicable  Pain medication: Opioid pathway  Dressing care/ Wound VAC: Wound VAC  Ambulatory devices: Ambulate as needed  Discharge to: Plan for return to the operating room on Friday for repeat debridement.  Follow-up: In the office 1 week post operative.

## 2024-05-15 NOTE — Transfer of Care (Signed)
 Immediate Anesthesia Transfer of Care Note  Patient: Deanna Yang  Procedure(s) Performed: EXCISION, CLAVICLE, DISTAL, OPEN (Left: Shoulder)  Patient Location: PACU  Anesthesia Type:General  Level of Consciousness: drowsy and patient cooperative  Airway & Oxygen  Therapy: Patient Spontanous Breathing and Patient connected to nasal cannula oxygen   Post-op Assessment: Report given to RN and Post -op Vital signs reviewed and stable  Post vital signs: Reviewed and stable  Last Vitals:  Vitals Value Taken Time  BP 123/88 (99)   Temp 37 C   Pulse 85   Resp 16   SpO2 98%     Last Pain:  Vitals:   05/15/24 0845  TempSrc:   PainSc: 7      Patient c/o mild pain upon arrival to recovery -- 50 mcg of Fentanyl  administered    Complications: No notable events documented.

## 2024-05-15 NOTE — Consult Note (Signed)
 [facility]  Total days of antibiotics 1       Reason for Consult: left clavicular osteomyelitis, with HW removal   Referring Physician: duda  Principal Problem:   Infection Active Problems:   Acute osteomyelitis of clavicle, left (HCC)   Hardware complicating wound infection (HCC)    HPI: Deanna Yang is a 63 y.o. female  with hx of left clavicle ORIF x 2, with HW loosening. She was seen by dr duda on 6/2 where sh ahd rednessand pain associated to her left shoulder/clavicle. At that time there was concern for left clavicle osteomyelitis with HW involvement which would required debridement and HW removal which she underwnet on 6/4. Started on vancomycin  and ceftriaxone post -op. Wound vac in place. Anticipated to go back on 6/6 for wound vac change and possible tissue graft. She remains afebrile. Labs show wbc of 7.6 and cr mildly elevated at 1,45 (her BL on 1.22) she denies ongoing fevers, chills, drenching nightsweats .  Past Medical History:  Diagnosis Date   Anxiety    Arthritis    Asthma    Chronic kidney disease    stage 2   COPD (chronic obstructive pulmonary disease) (HCC)    Depression    Dyspnea    On 3L oxygen  when laying down   Fatty liver 07/23/2018   Fibromyalgia    GERD (gastroesophageal reflux disease)    Headache    HSV infection    Hypertension    Manic depression (HCC)    Neuropathy    hands   OSA (obstructive sleep apnea)    does not use CPAP   Pneumonia    "couple of times"    Allergies:  Allergies  Allergen Reactions   Ambien [Zolpidem Tartrate] Shortness Of Breath and Swelling    Tongue swelling, throat swelling.    Beta Adrenergic Blockers Anaphylaxis   Fish Allergy Shortness Of Breath and Rash   Iodinated Contrast Media Itching    Benadryl  50MG  prophylaxis before and after and patient reports she did fine   Latex Itching and Other (See Comments)    Blisters skin   Metoprolol  Other (See Comments)    ANY  MEDICATION THAT ENDS IN -OLOL-   The patient is asthmatic.   Penicillins Shortness Of Breath, Rash and Other (See Comments)        Red Dye #40 (Allura Red) Hives and Shortness Of Breath    Contrast    Shellfish Allergy Anaphylaxis   Sulfa Antibiotics Hives and Shortness Of Breath   Amoxicillin-Pot Clavulanate Nausea And Vomiting   Aspirin  Other (See Comments)    Blisters on tongue from higher doses - tolerates 81 mg    Dye Fdc Red  [Red Dye #2 (Amaranth)] Hives    X ray DYE  (BENADRYL  50 MG PROPHYLAXIS AND AFTER AND  SHE DID FINE, PER PATIENT.).   Tape Dermatitis and Rash    Adhesive    Talwin [Pentazocine] Other (See Comments)    Unknown reaction   Levofloxacin  Rash and Other (See Comments)    headache      MEDICATIONS:  acetaminophen   1,000 mg Oral Q6H   [START ON 05/16/2024] atorvastatin  20 mg Oral Daily   DULoxetine  60 mg Oral BID   gabapentin   1,800 mg Oral QHS   lamoTRIgine  100 mg Oral QHS   [START ON 05/16/2024] lisinopril  10 mg Oral q AM   montelukast   10 mg Oral QHS  morphine   30 mg Oral Q12H   pantoprazole   40 mg Oral Daily   prazosin   5 mg Oral QHS   QUEtiapine   300 mg Oral QHS    Social History   Tobacco Use   Smoking status: Former    Current packs/day: 0.00    Types: Cigarettes    Quit date: 01/17/2017    Years since quitting: 7.3   Smokeless tobacco: Never  Vaping Use   Vaping status: Never Used  Substance Use Topics   Alcohol use: No    Comment: last use 2-3 years ago   Drug use: No    Family History  Problem Relation Age of Onset   Alcohol abuse Mother    Alcohol abuse Father    Depression Sister    Dementia Neg Hx     Review of Systems -  12 point ros is otherwise negative  OBJECTIVE: Temp:  [97.6 F (36.4 C)-98.1 F (36.7 C)] 97.6 F (36.4 C) (06/04 1549) Pulse Rate:  [71-82] 77 (06/04 1549) Resp:  [10-18] 17 (06/04 1549) BP: (92-124)/(56-113) 107/58 (06/04 1549) SpO2:  [90 %-96 %] 96 % (06/04 1549) Weight:  [93.9 kg] 93.9  kg (06/04 0810) Physical Exam  Constitutional:  oriented to person, place, and time. appears well-developed and well-nourished. No distress.  HENT: [place]/AT, PERRLA, no scleral icterus Mouth/Throat: Oropharynx is clear and moist. No oropharyngeal exudate.  Chest wall = has left clavicular wound vac in place Cardiovascular: Normal rate, regular rhythm and normal heart sounds. Exam reveals no gallop and no friction rub.  No murmur heard.  Pulmonary/Chest: Effort normal and breath sounds normal. No respiratory distress.  has no wheezes.  Neck = supple, no nuchal rigidity Abdominal: Soft. Bowel sounds are normal.  exhibits no distension. There is no tenderness.  Lymphadenopathy: no cervical adenopathy. No axillary adenopathy Neurological: alert and oriented to person, place, and time.  Skin: Skin is warm and dry. No rash noted. No erythema.  Psychiatric: a normal mood and affect.  behavior is normal.    LABS: Results for orders placed or performed during the hospital encounter of 05/15/24 (from the past 48 hours)  Basic metabolic panel per protocol     Status: Abnormal   Collection Time: 05/15/24  9:26 AM  Result Value Ref Range   Sodium 137 135 - 145 mmol/L   Potassium 3.6 3.5 - 5.1 mmol/L   Chloride 106 98 - 111 mmol/L   CO2 22 22 - 32 mmol/L   Glucose, Bld 123 (H) 70 - 99 mg/dL    Comment: Glucose reference range applies only to samples taken after fasting for at least 8 hours.   BUN 10 8 - 23 mg/dL   Creatinine, Ser 4.09 (H) 0.44 - 1.00 mg/dL   Calcium 8.3 (L) 8.9 - 10.3 mg/dL   GFR, Estimated 41 (L) >60 mL/min    Comment: (NOTE) Calculated using the CKD-EPI Creatinine Equation (2021)    Anion gap 9 5 - 15    Comment: Performed at [facility], [address], [place], [place] [address]  CBC per protocol     Status: Abnormal   Collection Time: 05/15/24  9:26 AM  Result Value Ref Range   WBC 7.6 4.0 - 10.5 K/uL   RBC 4.10 3.87 - 5.11 MIL/uL   Hemoglobin 11.6 (L) 12.0 -  15.0 g/dL   HCT 47.8 29.5 - 62.1 %   MCV 87.8 80.0 - 100.0 fL   MCH 28.3 26.0 - 34.0 pg  MCHC 32.2 30.0 - 36.0 g/dL   RDW 40.9 81.1 - 91.4 %   Platelets 328 150 - 400 K/uL   nRBC 0.0 0.0 - 0.2 %    Comment: Performed at [facility], [address], [place], [place] [address]  Aerobic/Anaerobic Culture w Gram Stain (surgical/deep wound)     Status: None (Preliminary result)   Collection Time: 05/15/24 12:02 PM   Specimen: Soft Tissue, Other  Result Value Ref Range   Specimen Description TISSUE    Special Requests NONE    Gram Stain      NO WBC SEEN NO ORGANISMS SEEN Performed at [facility], [address], [place], [place] [address]    Culture PENDING    Report Status PENDING     MICRO: reviewed IMAGING: No results found.  HISTORICAL MICRO/IMAGING  Assessment/Plan:  left clavicular osteomyelitis with HW removal- - will check sed rate and crp - start with ceftriaxone and vancomycin  - will follow cultures to see if can narrow abtx  - anticipate to need iv abtx for a portion of the 6 wk of treatment - ckd 3 = will dose adjust abtx  - therapeutic monitoring = while on vancomycin , will check vanco levels and kidney function  No isolation needs at this time  evaluation of this patient requires complex antimicrobial therapy evaluation and counseling and isolation needs for disease transmission risk assessment and mitigation.   I have personally spent 85  minutes involved in face-to-face and non-face-to-face activities for this patient on the day of the visit. Professional time spent includes the following activities: Preparing to see the patient (review of tests), Obtaining and/or reviewing separately obtained history (admission/discharge record), Performing a medically appropriate examination and/or evaluation , Ordering medications/tests/procedures, referring and communicating with other health care professionals, Documenting clinical information in the EMR,  Independently interpreting results (not separately reported), Communicating results to the patient.

## 2024-05-15 NOTE — Anesthesia Postprocedure Evaluation (Signed)
 Anesthesia Post Note  Patient: Jaycee Metro  Procedure(s) Performed: EXCISION, CLAVICLE, DISTAL, OPEN (Left: Shoulder)     Patient location during evaluation: PACU Anesthesia Type: General Level of consciousness: awake and alert Pain management: pain level controlled Vital Signs Assessment: post-procedure vital signs reviewed and stable Respiratory status: spontaneous breathing, nonlabored ventilation and respiratory function stable Cardiovascular status: blood pressure returned to baseline Postop Assessment: no apparent nausea or vomiting Anesthetic complications: no   No notable events documented.  Last Vitals:  Vitals:   05/15/24 1345 05/15/24 1400  BP: 121/79 98/72  Pulse: 81 71  Resp: 14 11  Temp:    SpO2: 90% 92%    Last Pain:  Vitals:   05/15/24 1330  TempSrc:   PainSc: Asleep                 Rayfield Cairo

## 2024-05-16 ENCOUNTER — Encounter (HOSPITAL_COMMUNITY): Payer: Self-pay | Admitting: Orthopedic Surgery

## 2024-05-16 LAB — CBC
HCT: 35.8 % — ABNORMAL LOW (ref 36.0–46.0)
Hemoglobin: 11.4 g/dL — ABNORMAL LOW (ref 12.0–15.0)
MCH: 28.6 pg (ref 26.0–34.0)
MCHC: 31.8 g/dL (ref 30.0–36.0)
MCV: 89.7 fL (ref 80.0–100.0)
Platelets: 348 10*3/uL (ref 150–400)
RBC: 3.99 MIL/uL (ref 3.87–5.11)
RDW: 14 % (ref 11.5–15.5)
WBC: 10.4 10*3/uL (ref 4.0–10.5)
nRBC: 0 % (ref 0.0–0.2)

## 2024-05-16 LAB — BASIC METABOLIC PANEL WITH GFR
Anion gap: 9 (ref 5–15)
BUN: 8 mg/dL (ref 8–23)
CO2: 24 mmol/L (ref 22–32)
Calcium: 8.3 mg/dL — ABNORMAL LOW (ref 8.9–10.3)
Chloride: 108 mmol/L (ref 98–111)
Creatinine, Ser: 1.22 mg/dL — ABNORMAL HIGH (ref 0.44–1.00)
GFR, Estimated: 50 mL/min — ABNORMAL LOW (ref >60)
Glucose, Bld: 142 mg/dL — ABNORMAL HIGH (ref 70–99)
Potassium: 3.9 mmol/L (ref 3.5–5.1)
Sodium: 141 mmol/L (ref 135–145)

## 2024-05-16 LAB — SEDIMENTATION RATE: Sed Rate: 13 mm/h (ref 0–22)

## 2024-05-16 LAB — C-REACTIVE PROTEIN: CRP: <0.5 mg/dL (ref <1.0)

## 2024-05-16 MED ORDER — JUVEN PO PACK
1.0000 | PACK | Freq: Two times a day (BID) | ORAL | Status: DC
  Administered 2024-05-17 – 2024-05-20 (×5): 1 via ORAL
  Filled 2024-05-16 (×6): qty 1

## 2024-05-16 NOTE — Plan of Care (Signed)

## 2024-05-16 NOTE — Progress Notes (Signed)
 [facility]  Date of Admission:  05/15/2024     Reason for Follow Up: Infection  Total days of antibiotics 2         ASSESSMENT:  Deanna Yang is postop day 1 from removal of failed clavicle plate in the setting of suspected infection.  Surgical specimens without growth in less than 24 hours.  Tolerating antibiotics with no adverse side effects.  Plan to return to the OR tomorrow for further debridement.  Continue current dose of vancomycin  and ceftriaxone.  Anticipate will need a prolonged course of antibiotics followed.  Continue universal/standard precautions. Postoperative wound care and remaining medical and supportive care per orthopedics.   PLAN:  Continue current dose of vancomycin  and ceftriaxone. Postoperative wound care per orthopedics with plan to return to the OR tomorrow. Monitor cultures for any organisms underwent access appropriate. Standard/universal functions. Remaining medical and supportive care per primary team.  Principal Problem:   Infection Active Problems:   Acute osteomyelitis of clavicle, left (HCC)   Hardware complicating wound infection (HCC)    atorvastatin  20 mg Oral Daily   DULoxetine  60 mg Oral BID   gabapentin   1,800 mg Oral QHS   lamoTRIgine  100 mg Oral QHS   lisinopril  10 mg Oral q AM   montelukast   10 mg Oral QHS   morphine   30 mg Oral Q12H   pantoprazole   40 mg Oral Daily   prazosin   5 mg Oral QHS   QUEtiapine   300 mg Oral QHS    SUBJECTIVE:  Afebrile overnight with no acute events.  Having burning sensations and did not sleep well.  On the phone with social work during visit.  Allergies  Allergen Reactions   Ambien [Zolpidem Tartrate] Shortness Of Breath and Swelling    Tongue swelling, throat swelling.    Beta Adrenergic Blockers Anaphylaxis   Fish Allergy Shortness Of Breath and Rash   Iodinated Contrast Media Itching    Benadryl  50MG  prophylaxis before and after and patient reports she did  fine   Latex Itching and Other (See Comments)    Blisters skin   Metoprolol  Other (See Comments)    ANY MEDICATION THAT ENDS IN -OLOL-   The patient is asthmatic.   Penicillins Shortness Of Breath, Rash and Other (See Comments)        Red Dye #40 (Allura Red) Hives and Shortness Of Breath    Contrast    Shellfish Allergy Anaphylaxis   Sulfa Antibiotics Hives and Shortness Of Breath   Amoxicillin-Pot Clavulanate Nausea And Vomiting   Aspirin  Other (See Comments)    Blisters on tongue from higher doses - tolerates 81 mg    Dye Fdc Red  [Red Dye #2 (Amaranth)] Hives    X ray DYE  (BENADRYL  50 MG PROPHYLAXIS AND AFTER AND  SHE DID FINE, PER PATIENT.).   Tape Dermatitis and Rash    Adhesive    Talwin [Pentazocine] Other (See Comments)    Unknown reaction   Levofloxacin  Rash and Other (See Comments)    headache     Review of Systems: Review of Systems  Constitutional:  Negative for chills, fever and weight loss.  Respiratory:  Negative for cough, shortness of breath and wheezing.   Cardiovascular:  Negative for chest pain and leg swelling.  Gastrointestinal:  Negative for abdominal pain, constipation, diarrhea, nausea and vomiting.  Musculoskeletal:        Postoperative pain  Skin:  Negative for rash.  OBJECTIVE: Vitals:   05/15/24 1549 05/15/24 2008 05/16/24 0757 05/16/24 1100  BP: (!) 107/58 127/77 104/63 128/83  Pulse: 77 77 89 88  Resp: 17  16   Temp: 97.6 F (36.4 C) 98.4 F (36.9 C) 98.2 F (36.8 C)   TempSrc:  Oral    SpO2: 96%  97%   Weight:      Height:       Body mass index is 34.45 kg/m.  Physical Exam Constitutional:      General: She is not in acute distress.    Appearance: She is well-developed.  Cardiovascular:     Rate and Rhythm: Normal rate and regular rhythm.     Heart sounds: Normal heart sounds.  Pulmonary:     Effort: Pulmonary effort is normal.     Breath sounds: Normal breath sounds.  Skin:    General: Skin is warm and dry.   Neurological:     Mental Status: She is alert.     Lab Results Lab Results  Component Value Date   WBC 10.4 05/16/2024   HGB 11.4 (L) 05/16/2024   HCT 35.8 (L) 05/16/2024   MCV 89.7 05/16/2024   PLT 348 05/16/2024    Lab Results  Component Value Date   CREATININE 1.22 (H) 05/16/2024   BUN 8 05/16/2024   NA 141 05/16/2024   K 3.9 05/16/2024   CL 108 05/16/2024   CO2 24 05/16/2024    Lab Results  Component Value Date   ALT 23 11/12/2022   AST 17 11/12/2022   ALKPHOS 69 11/12/2022   BILITOT 0.7 11/12/2022     Microbiology: Recent Results (from the past 240 hours)  Aerobic/Anaerobic Culture w Gram Stain (surgical/deep wound)     Status: None (Preliminary result)   Collection Time: 05/15/24 12:02 PM   Specimen: Soft Tissue, Other  Result Value Ref Range Status   Specimen Description TISSUE  Final   Special Requests NONE  Final   Gram Stain NO WBC SEEN NO ORGANISMS SEEN   Final   Culture   Final    NO GROWTH < 24 HOURS Performed at [facility], [address], [place], [place] [address]    Report Status PENDING  Incomplete   I have personally spent 28 minutes involved in face-to-face and non-face-to-face activities for this patient on the day of the visit. Professional time spent includes the following activities: Preparing to see the patient (review of tests),  performing a medically appropriate examination, ordering medications, communicating with other health care professionals, documenting clinical information in the EMR, communicating results and counseling patient regarding medication and plan of care, and care coordination.   Greg Hooria Gasparini, NP [facility]  05/16/2024  1:42 PM

## 2024-05-16 NOTE — TOC Initial Note (Addendum)
 Transition of Care [facility]) - Initial/Assessment Note    Patient Details  Name: Deanna Yang MRN: [id number] Date of Birth: 10/12/1961  [facility] of [facility]) CM/SW Contact:    Terre Ferri, RN Phone Number: 05/16/2024, 12:43 PM  Clinical Narrative:                  Spoke to patient at bedside.   Patient from home alone. Mother close by. 62 year old grand daughter stays with her.   PT recommendations HHPT may progress to no PT follow up   ID following await home plan PO vs IV   Patient will need rolling walker for home. Placed order did not call for yet.   Anticipated discharge date unknown at present. Plan to return to surgery tomorrow.   HH agencies wanting to know anticipated discharge date before accepting. Secure chatted team. Same unknown at present. TOC will continue to follow.   Discussed with patient. Patient already has an infusion company. She receives an infusion monthly , she is unsure what the infusion is and name of infusion company. Patient provided her infusion nurse contact Sheri [phone]. NCM called same and left voicemail   Sheri returned call. Arlyce Berger is with [facility] they are a pharmacy not home health. Patient receives IVIG monthly. Office number [phone] fax [phone]. Called spoke to Jasmin, they can provide HHRN for infusion. Patient will need another agency for HHPT  Expected Discharge Plan: Home w Home Health Services Barriers to Discharge: Continued Medical Work up   Patient Goals and CMS Choice Patient states their goals for this hospitalization and ongoing recovery are:: to return to home CMS Medicare.gov Compare Post Acute Care list provided to:: Patient Choice offered to / list presented to : Patient      Expected Discharge Plan and Services   Discharge Planning Services: CM Consult Post Acute Care Choice: Home Health Living arrangements for the past 2 months: Single Family Home                 DME  Arranged: Walker rolling         HH Arranged: PT          Prior Living Arrangements/Services Living arrangements for the past 2 months: Single Family Home Lives with:: Self Patient language and need for interpreter reviewed:: Yes Do you feel safe going back to the place where you live?: Yes      Need for Family Participation in Patient Care: Yes (Comment) Care giver support system in place?: Yes (comment)   Criminal Activity/Legal Involvement Pertinent to Current Situation/Hospitalization: No - Comment as needed  Activities of Daily Living   ADL Screening (condition at time of admission) Independently performs ADLs?: Yes (appropriate for developmental age) Is the patient deaf or have difficulty hearing?: No Does the patient have difficulty seeing, even when wearing glasses/contacts?: No Does the patient have difficulty concentrating, remembering, or making decisions?: No  Permission Sought/Granted   Permission granted to share information with : Yes, Verbal Permission Granted  Share Information with NAME: Sheri Infusion nurse [phone]  Permission granted to share info w AGENCY: home health agencies        Emotional Assessment Appearance:: Appears stated age Attitude/Demeanor/Rapport: Engaged Affect (typically observed): Accepting Orientation: : Oriented to Self, Oriented to Place, Oriented to  Time, Oriented to Situation Alcohol / Substance Use: Not Applicable Psych Involvement: No (comment)  Admission diagnosis:  Retained orthopedic hardware [L24.9]  Infection [B99.9] Patient Active Problem List   Diagnosis Date Noted   Acute osteomyelitis of clavicle, left (HCC) 05/15/2024   Hardware complicating wound infection (HCC) 05/15/2024   Infection 05/15/2024   Displaced fracture of shaft of left clavicle, initial encounter for closed fracture 03/12/2024   Closed fracture of right fibula and tibia 11/13/2022   Traumatic closed displaced fracture of right tibial  plafond with fibula 11/12/2022   Depression 11/12/2022   Chronic pain syndrome 11/12/2022   Stage 3a chronic kidney disease (HCC)    Sinus tachycardia 08/07/2018   Constipation due to pain medication therapy 08/06/2018   Paralytic ileus (HCC) 07/24/2018   AKI (acute kidney injury) (HCC)    Fatty liver 07/23/2018   Malnutrition of moderate degree 07/21/2018   Hypertensive urgency 07/20/2018   Nausea and vomiting 07/20/2018   ARF (acute renal failure) (HCC) 07/19/2018   Vocal cord dysfunction 04/25/2017   PTSD (post-traumatic stress disorder) 04/25/2017   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   Acute respiratory failure with hypoxia (HCC) 04/22/2017   Hypokalemia 04/22/2017   Leukocytosis 04/22/2017   Prolonged QT interval 04/22/2017   CFIDS (chronic fatigue and immune dysfunction syndrome) (HCC) 08/20/2015   Foot pain 08/20/2015   Essential (primary) hypertension 08/20/2015   COPD (chronic obstructive pulmonary disease) (HCC) 08/20/2015   Obstructive apnea 08/20/2015   Cervical osteoarthritis 08/20/2015   Sleep disorder 08/20/2015   Other specified postprocedural states 07/17/2015   Borderline personality disorder (HCC) 06/11/2015   PCP:  Emaline Handsome, MD Pharmacy:   [facility] [phone] - [place], [place] - [address] [place] [address] Phone: [phone] Fax: [phone]     Social Drivers of Health (SDOH) Social History: SDOH Screenings   Food Insecurity: Food Insecurity Present (05/16/2024)  Housing: High Risk (05/16/2024)  Transportation Needs: No Transportation Needs (05/16/2024)  Recent Concern: Transportation Needs - Unmet Transportation Needs (05/08/2024)   Received from [facility]  Utilities: Not At Risk (05/16/2024)  Financial Resource Strain: Medium Risk (05/08/2024)   Received from [facility]  Physical Activity: Unknown (05/08/2024)   Received from [facility]  Social Connections: Socially Isolated (05/08/2024)    Received from [facility]  Stress: Stress Concern Present (05/08/2024)   Received from [facility]  Tobacco Use: Medium Risk (05/15/2024)   SDOH Interventions:     Readmission Risk Interventions     No data to display

## 2024-05-16 NOTE — Evaluation (Signed)
 Occupational Therapy Evaluation Patient Details Name: AMILLIANA HAYWORTH MRN: [id number] DOB: 1961/05/27 Today's Date: 05/16/2024   History of Present Illness   Pt is a 63 yo female s/p excision distal clavicle for acute osteomyelitis, removal of hardware 6/4. ORIF L clavicle x2 03/12/24 and 04/09/24. Other PMH includes anxiety, CKD, COPD, depression, fibromyalgia, GERD, neuropathy in hands, OSA, R ORIF radial fx 2023, R tibia IMN 2023.     Clinical Impressions At baseline, pt is Independent to Mod I with ADLs and IADLs and performs funcitonal mobility Independent to Mod I. Pt reports more than 20 falls in the past 6 months. Pt now presents with decreased activity tolerance, generalized B UE weakness, pain in Left clavicle affecting functional level, decreased balance, and decreased safety and independence with functional tasks. Pt currently demonstrates ability to complete ADLs Independent to Min assist and functional transfers without an AD with Supervision for safety. Pt participated well in session but was limited by fatigue. Pt will benefit from acute skilled OT services to address deficits outlined below and to increase safety and independence with functional tasks. Post acute discharge, pt will benefit from [facility] OT to maximize rehab potential.      If plan is discharge home, recommend the following:   A little help with walking and/or transfers;A little help with bathing/dressing/bathroom;Assistance with cooking/housework;Assist for transportation;Help with stairs or ramp for entrance     Functional Status Assessment   Patient has had a recent decline in their functional status and demonstrates the ability to make significant improvements in function in a reasonable and predictable amount of time.     Equipment Recommendations   Other (comment) (TBD based on pt progress)     Recommendations for Other Services         Precautions/Restrictions   Precautions Precautions:  Fall Restrictions Weight Bearing Restrictions Per Provider Order: No     Mobility Bed Mobility Overal bed mobility: Needs Assistance Bed Mobility: Supine to Sit, Sit to Supine     Supine to sit: Supervision, HOB elevated, Used rails Sit to supine: Supervision, HOB elevated, Used rails   General bed mobility comments: Supervision for safety    Transfers Overall transfer level: Needs assistance Equipment used: None Transfers: Sit to/from Stand, Bed to chair/wheelchair/BSC Sit to Stand: Supervision     Step pivot transfers: Supervision     General transfer comment: Supervision for safety, slow to rise and sit      Balance Overall balance assessment: Needs assistance, History of Falls Sitting-balance support: No upper extremity supported, Feet supported Sitting balance-Leahy Scale: Good     Standing balance support: No upper extremity supported, During functional activity Standing balance-Leahy Scale: Fair                             ADL either performed or assessed with clinical judgement   ADL Overall ADL's : Needs assistance/impaired Eating/Feeding: Independent;Sitting   Grooming: Supervision/safety;Standing   Upper Body Bathing: Contact guard assist;Minimal assistance;Cueing for compensatory techniques;Sitting   Lower Body Bathing: Supervison/ safety;Contact guard assist;Sitting/lateral leans;Sit to/from stand   Upper Body Dressing : Minimal assistance;Sitting;Cueing for compensatory techniques Upper Body Dressing Details (indicate cue type and reason): assist for managing lines and wound vac tubing Lower Body Dressing: Supervision/safety;Sitting/lateral leans;Sit to/from stand   Toilet Transfer: Supervision/safety;BSC/3in1 (step-pivot transfer; without an AD)   Toileting- Clothing Manipulation and Hygiene: Supervision/safety;Sitting/lateral lean;Sit to/from stand       Functional mobility during ADLs:  (  Pt declined further mobility this  session due to fatigue) General ADL Comments: Pt with decreased activity tolerance, fatiguing quickly during tasks.     Vision Patient Visual Report: No change from baseline Vision Assessment?: No apparent visual deficits     Perception         Praxis         Pertinent Vitals/Pain Pain Assessment Pain Assessment: Faces Faces Pain Scale: Hurts even more Pain Location: L shoulder Pain Descriptors / Indicators: Discomfort, Grimacing, Guarding Pain Intervention(s): Limited activity within patient's tolerance, Monitored during session, Repositioned     Extremity/Trunk Assessment Upper Extremity Assessment Upper Extremity Assessment: Right hand dominant;Generalized weakness;LUE deficits/detail;RUE deficits/detail RUE Sensation: history of peripheral neuropathy LUE Deficits / Details: s/p excision distal clavicle for acute osteomyelitis, removal of hardware 6/4. ORIF L clavicle x2 03/12/24 and 04/09/24; wound vac donned L clavicle; pain in L clavicle at rest and with movement LUE Sensation: history of peripheral neuropathy   Lower Extremity Assessment Lower Extremity Assessment: Defer to PT evaluation   Cervical / Trunk Assessment Cervical / Trunk Assessment: Normal   Communication Communication Communication: No apparent difficulties   Cognition Arousal: Alert Behavior During Therapy: WFL for tasks assessed/performed Cognition: No apparent impairments             OT - Cognition Comments: Pt AAOx4 and pleasant throughout session. Pt cognition WFL for tasks assessed.                 Following commands: Intact       Cueing  General Comments   Cueing Techniques: Verbal cues      Exercises     Shoulder Instructions      Home Living Family/patient expects to be discharged to:: Private residence Living Arrangements: Alone;Other (Comment) (Pt reports she often takes care of her 48 yo granddaughter on weekends) Available Help at Discharge: Family;Available  PRN/intermittently Type of Home: House Home Access: Ramped entrance     Home Layout: One level     Bathroom Shower/Tub: Tub only   Firefighter: Standard     Home Equipment: Wheelchair - manual;Other (comment) (bed with adjustable head/foot)   Additional Comments: Pt reports wheelchair is broken. Pt states she lives in 2 outbuildings put together, and it has a plywood floor with areas of rot in the kitchen/living room. Has running water  and HVAC. Pt states it is not an option for her to d/c home with her parents because she can't live with her dad. pt reports she has a HHRN that comes 1x/month to manage IV medications.      Prior Functioning/Environment Prior Level of Function : Independent/Modified Independent;History of Falls (last six months);Driving             Mobility Comments: Pt reprots >20 falls in the past 6 months ADLs Comments: Independent to Mod I with ADLs and IADLs. Pt plans to sponge bathe post acute discharge since she has a tub only at home. Pt enjoys making quilts and other crafts.    OT Problem List: Decreased strength;Decreased activity tolerance;Impaired balance (sitting and/or standing);Pain   OT Treatment/Interventions: Self-care/ADL training;Therapeutic exercise;DME and/or AE instruction;Therapeutic activities;Patient/family education;Balance training      OT Goals(Current goals can be found in the care plan section)   Acute Rehab OT Goals Patient Stated Goal: to not have the wound vac anymore, to return home, and to be able to spend time with her granddaughter OT Goal Formulation: With patient Time For Goal Achievement: 05/30/24 Potential to Achieve Goals:  Good ADL Goals Pt Will Perform Upper Body Bathing: with modified independence;sitting Pt Will Perform Lower Body Bathing: with modified independence;sitting/lateral leans;sit to/from stand Pt Will Perform Upper Body Dressing: with modified independence;sitting Pt Will Perform Lower Body  Dressing: with modified independence;sitting/lateral leans;sit to/from stand Pt Will Transfer to Toilet: with modified independence;ambulating;regular height toilet (with least restrictive AD) Pt Will Perform Toileting - Clothing Manipulation and hygiene: with modified independence;sitting/lateral leans;sit to/from stand Additional ADL Goal #1: Patient will demonstrate ability to Independently state 4 fall prevention strategies for increased safety and independence with functional tasks.   OT Frequency:  Min 2X/week    Co-evaluation              AM-PAC OT "6 Clicks" Daily Activity     Outcome Measure Help from another person eating meals?: None Help from another person taking care of personal grooming?: A Little Help from another person toileting, which includes using toliet, bedpan, or urinal?: A Little Help from another person bathing (including washing, rinsing, drying)?: A Little Help from another person to put on and taking off regular upper body clothing?: A Little Help from another person to put on and taking off regular lower body clothing?: A Little 6 Click Score: 19   End of Session Equipment Utilized During Treatment: Other (comment) [facility]) Nurse Communication: Mobility status;Other (comment) (Pt has a question regarding whether she is getting one of her home medications while in the hospital.)  Activity Tolerance: Patient tolerated treatment well Patient left: in bed;with call bell/phone within reach  OT Visit Diagnosis: Unsteadiness on feet (R26.81);Muscle weakness (generalized) (M62.81);Pain;Other (comment) (decreased activity tolerance)                Time: 9629-5284 OT Time Calculation (min): 26 min Charges:  OT General Charges $OT Visit: 1 Visit OT Evaluation $OT Eval Low Complexity: 1 Low OT Treatments $Self Care/Home Management : 8-22 mins  Paytience Bures "Darral Ellis., OTR/L, MA Acute Rehab [phone]  Walt Gunner 05/16/2024, 9:42 PM

## 2024-05-16 NOTE — Progress Notes (Signed)
 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  S/P Partial excision left clavicle for acute osteomyelitis. Removal of failed deep hardware complicating wound infection.  Application of wound VAC cleanse choice sponge x 1.  Application Kerecis micro graft 38 cm with 1 g vancomycin  powder.  NPO past MN with consent ordered for return to the OR  ID consulted 05/15/24 Started on vancomycin  and ceftriaxone post -op.  They will follow cultures.

## 2024-05-16 NOTE — Evaluation (Signed)
 Physical Therapy Evaluation Patient Details Name: Deanna Yang MRN: [id number] DOB: 07-14-1961 Today's Date: 05/16/2024  History of Present Illness  63 yo female s/p excision distal clavicle for acute osteomyelitis, removal of hardware 6/4. ORIF L clavicle x2 03/12/24 and 04/09/24. Other PMH includes anxiety, CKD, COPD, depression, fibromyalgia, GERD, neuropathy in hands, OSA, R ORIF radial fx 2023, R tibia IMN 2023.  Clinical Impression   Pt presents with L shoulder, back, and LE pain; impaired balance with history of 20+ falls, LE weakness, and impaired activity tolerance. Pt to benefit from acute PT to address deficits. Pt ambulated short room distance with supervision level assist, pt declines further distance given fatigue stating "I didn't sleep at all last night". Pt may benefit from [facility] services at d/c. PT to progress mobility as tolerated, and will continue to follow acutely.          If plan is discharge home, recommend the following: A little help with walking and/or transfers;A little help with bathing/dressing/bathroom   Can travel by private vehicle        Equipment Recommendations Rolling walker (2 wheels)  Recommendations for Other Services       Functional Status Assessment Patient has had a recent decline in their functional status and demonstrates the ability to make significant improvements in function in a reasonable and predictable amount of time.     Precautions / Restrictions Precautions Precautions: Fall Restrictions Weight Bearing Restrictions Per Provider Order: No      Mobility  Bed Mobility Overal bed mobility: Needs Assistance Bed Mobility: Supine to Sit, Sit to Supine     Supine to sit: Supervision, HOB elevated Sit to supine: Supervision, HOB elevated   General bed mobility comments: for safety, use of HOB elevation and bedrails    Transfers Overall transfer level: Needs assistance Equipment used: None Transfers: Sit to/from Stand Sit to  Stand: Supervision           General transfer comment: for safety, slow to rise and sit    Ambulation/Gait Ambulation/Gait assistance: Supervision Gait Distance (Feet): 5 Feet Assistive device: None Gait Pattern/deviations: Step-through pattern, Decreased stride length, Trunk flexed Gait velocity: decr     General Gait Details: for safety, pt stepping forwards and backwards at EOB but declines further mobility due to pain and fatigue.  Stairs            Wheelchair Mobility     Tilt Bed    Modified Rankin (Stroke Patients Only)       Balance Overall balance assessment: Needs assistance, History of Falls Sitting-balance support: No upper extremity supported, Feet supported Sitting balance-Leahy Scale: Good     Standing balance support: No upper extremity supported, During functional activity Standing balance-Leahy Scale: Fair                               Pertinent Vitals/Pain Pain Assessment Pain Assessment: 0-10 Pain Score: 8  Pain Location: L shoulder, back, legs Pain Descriptors / Indicators: Discomfort, Grimacing, Guarding Pain Intervention(s): Limited activity within patient's tolerance, Monitored during session, Patient requesting pain meds-RN notified    Home Living Family/patient expects to be discharged to:: Private residence Living Arrangements: Alone Available Help at Discharge: Family Type of Home: House Home Access: Ramped entrance       Home Layout: One level Home Equipment: Wheelchair - manual;Other (comment) Additional Comments: "wheelchair is messed up"; has a elevating head and foot of bed at  home    Prior Function Prior Level of Function : Independent/Modified Independent;History of Falls (last six months);Driving             Mobility Comments: pt states she has fallen 20-25 times in the last 6 months. ADLs Comments: plans to sponge bathe since she has a tub only at home. pt states she lives in 2 outbuildings  put together, and it has a plywood floor with areas of rot in the kitchen/living room. Has running water  and HVAC. Pt states it is not an option for her to d/c home with her parents because she can't live with her dad. pt reports she has a HHRN that comes 1x/month to manage IV medications?     Extremity/Trunk Assessment   Upper Extremity Assessment Upper Extremity Assessment: Defer to OT evaluation;LUE deficits/detail LUE Deficits / Details: wound vac donned L clavicle LUE Sensation: history of peripheral neuropathy    Lower Extremity Assessment Lower Extremity Assessment: Generalized weakness    Cervical / Trunk Assessment Cervical / Trunk Assessment: Normal  Communication   Communication Communication: No apparent difficulties    Cognition Arousal: Alert Behavior During Therapy: WFL for tasks assessed/performed   PT - Cognitive impairments: No apparent impairments                       PT - Cognition Comments: at times, appears to be questionable historian Following commands: Intact       Cueing Cueing Techniques: Verbal cues     General Comments General comments (skin integrity, edema, etc.): pt states RUE has a rash and has had stinging at IV site, pt states she will alert her RN when she comes in to give pain meds at 10.    Exercises     Assessment/Plan    PT Assessment Patient needs continued PT services  PT Problem List Decreased strength;Decreased mobility;Decreased range of motion;Decreased activity tolerance;Decreased balance;Decreased knowledge of use of DME;Pain;Decreased safety awareness       PT Treatment Interventions DME instruction;Therapeutic activities;Gait training;Therapeutic exercise;Patient/family education;Balance training;Stair training;Functional mobility training;Neuromuscular re-education    PT Goals (Current goals can be found in the Care Plan section)  Acute Rehab PT Goals PT Goal Formulation: With patient Time For Goal  Achievement: 05/30/24 Potential to Achieve Goals: Good    Frequency Min 3X/week     Co-evaluation               AM-PAC PT "6 Clicks" Mobility  Outcome Measure Help needed turning from your back to your side while in a flat bed without using bedrails?: A Little Help needed moving from lying on your back to sitting on the side of a flat bed without using bedrails?: A Little Help needed moving to and from a bed to a chair (including a wheelchair)?: A Little Help needed standing up from a chair using your arms (e.g., wheelchair or bedside chair)?: A Little Help needed to walk in hospital room?: A Little Help needed climbing 3-5 steps with a railing? : A Lot 6 Click Score: 17    End of Session   Activity Tolerance: Patient tolerated treatment well Patient left: with call bell/phone within reach;in bed;with bed alarm set Nurse Communication: Mobility status PT Visit Diagnosis: Other abnormalities of gait and mobility (R26.89);Muscle weakness (generalized) (M62.81)    Time: 8657-8469 PT Time Calculation (min) (ACUTE ONLY): 16 min   Charges:   PT Evaluation $PT Eval Low Complexity: 1 Low   PT General Charges $$ ACUTE  PT VISIT: 1 Visit         Stevan Eberwein S, PT DPT Acute Rehabilitation Services Secure Chat Preferred  Office [phone]   Arman Loy Cydney Draft 05/16/2024, 11:17 AM

## 2024-05-16 NOTE — Progress Notes (Signed)
 Patient ID: Deanna Yang, female   DOB: May 26, 1961, 63 y.o.   MRN: [id number] Patient is postoperative day 1 removal of the failed clavicle plate with localized infection to the clavicle.  Tissues are pending.  Will plan for return to the operating room on Friday with repeat debridement.  Wound bed was filled with Kerecis micro graft and 1 g of vancomycin  powder.  There is no drainage in the wound VAC canister.

## 2024-05-16 NOTE — H&P (View-Only) (Signed)
 Patient ID: Deanna Yang, female   DOB: May 26, 1961, 63 y.o.   MRN: [id number] Patient is postoperative day 1 removal of the failed clavicle plate with localized infection to the clavicle.  Tissues are pending.  Will plan for return to the operating room on Friday with repeat debridement.  Wound bed was filled with Kerecis micro graft and 1 g of vancomycin  powder.  There is no drainage in the wound VAC canister.

## 2024-05-17 ENCOUNTER — Other Ambulatory Visit: Payer: Self-pay

## 2024-05-17 ENCOUNTER — Encounter (HOSPITAL_COMMUNITY): Payer: Self-pay | Admitting: Orthopedic Surgery

## 2024-05-17 ENCOUNTER — Inpatient Hospital Stay (HOSPITAL_COMMUNITY): Admitting: Anesthesiology

## 2024-05-17 ENCOUNTER — Encounter (HOSPITAL_COMMUNITY)
Admission: AD | Disposition: A | Discharge status: Home Health | Payer: Self-pay | Source: Home / Self Care | Attending: Orthopedic Surgery

## 2024-05-17 DIAGNOSIS — T8469XA Infection and inflammatory reaction due to internal fixation device of other site, initial encounter: Secondary | ICD-10-CM | POA: Diagnosis not present

## 2024-05-17 DIAGNOSIS — M86112 Other acute osteomyelitis, left shoulder: Secondary | ICD-10-CM | POA: Diagnosis not present

## 2024-05-17 DIAGNOSIS — T847XXA Infection and inflammatory reaction due to other internal orthopedic prosthetic devices, implants and grafts, initial encounter: Secondary | ICD-10-CM | POA: Diagnosis not present

## 2024-05-17 HISTORY — PX: RESECTION DISTAL CLAVICAL: SHX5053

## 2024-05-17 LAB — CBC
HCT: 35.6 % — ABNORMAL LOW (ref 36.0–46.0)
Hemoglobin: 11.5 g/dL — ABNORMAL LOW (ref 12.0–15.0)
MCH: 29.2 pg (ref 26.0–34.0)
MCHC: 32.3 g/dL (ref 30.0–36.0)
MCV: 90.4 fL (ref 80.0–100.0)
Platelets: 332 10*3/uL (ref 150–400)
RBC: 3.94 MIL/uL (ref 3.87–5.11)
RDW: 14.2 % (ref 11.5–15.5)
WBC: 9 10*3/uL (ref 4.0–10.5)
nRBC: 0 % (ref 0.0–0.2)

## 2024-05-17 LAB — BASIC METABOLIC PANEL WITH GFR
Anion gap: 9 (ref 5–15)
BUN: 7 mg/dL — ABNORMAL LOW (ref 8–23)
CO2: 25 mmol/L (ref 22–32)
Calcium: 8.5 mg/dL — ABNORMAL LOW (ref 8.9–10.3)
Chloride: 109 mmol/L (ref 98–111)
Creatinine, Ser: 1.23 mg/dL — ABNORMAL HIGH (ref 0.44–1.00)
GFR, Estimated: 50 mL/min — ABNORMAL LOW (ref >60)
Glucose, Bld: 105 mg/dL — ABNORMAL HIGH (ref 70–99)
Potassium: 4.8 mmol/L (ref 3.5–5.1)
Sodium: 143 mmol/L (ref 135–145)

## 2024-05-17 SURGERY — EXCISION, CLAVICLE, DISTAL, OPEN
Anesthesia: General | Site: Chest | Laterality: Left

## 2024-05-17 MED ORDER — ACETAMINOPHEN 10 MG/ML IV SOLN
INTRAVENOUS | Status: AC
  Filled 2024-05-17: qty 100

## 2024-05-17 MED ORDER — FENTANYL CITRATE (PF) 250 MCG/5ML IJ SOLN
INTRAMUSCULAR | Status: AC
  Filled 2024-05-17: qty 5

## 2024-05-17 MED ORDER — FENTANYL CITRATE (PF) 100 MCG/2ML IJ SOLN
INTRAMUSCULAR | Status: AC
  Filled 2024-05-17: qty 2

## 2024-05-17 MED ORDER — SODIUM CHLORIDE 0.9% FLUSH: 3.0000 mL | INTRAVENOUS | Status: DC | PRN

## 2024-05-17 MED ORDER — VANCOMYCIN HCL 1000 MG IV SOLR
INTRAVENOUS | Status: AC
  Filled 2024-05-17: qty 20

## 2024-05-17 MED ORDER — PROPOFOL 10 MG/ML IV BOLUS
INTRAVENOUS | Status: AC
  Filled 2024-05-17: qty 20

## 2024-05-17 MED ORDER — MIDAZOLAM HCL 2 MG/2ML IJ SOLN
INTRAMUSCULAR | Status: AC
  Filled 2024-05-17: qty 2

## 2024-05-17 MED ORDER — ALBUTEROL SULFATE HFA 108 (90 BASE) MCG/ACT IN AERS
2.0000 | INHALATION_SPRAY | Freq: Four times a day (QID) | RESPIRATORY_TRACT | Status: DC | PRN
  Filled 2024-05-17: qty 6.7

## 2024-05-17 MED ORDER — ONDANSETRON HCL 4 MG/2ML IJ SOLN
INTRAMUSCULAR | Status: DC | PRN
  Administered 2024-05-17: 4 mg via INTRAVENOUS

## 2024-05-17 MED ORDER — CHLORHEXIDINE GLUCONATE 4 % EX SOLN: 60.0000 mL | Freq: Once | CUTANEOUS | Status: DC

## 2024-05-17 MED ORDER — MIDAZOLAM HCL 2 MG/2ML IJ SOLN
INTRAMUSCULAR | Status: DC | PRN
  Administered 2024-05-17: 2 mg via INTRAVENOUS

## 2024-05-17 MED ORDER — LACTATED RINGERS IV SOLN: INTRAVENOUS | Status: DC

## 2024-05-17 MED ORDER — POVIDONE-IODINE 10 % EX SWAB: 2.0000 | Freq: Once | CUTANEOUS | Status: DC

## 2024-05-17 MED ORDER — LIDOCAINE 2% (20 MG/ML) 5 ML SYRINGE
INTRAMUSCULAR | Status: DC | PRN
  Administered 2024-05-17: 60 mg via INTRAVENOUS

## 2024-05-17 MED ORDER — VANCOMYCIN HCL 1000 MG IV SOLR
INTRAVENOUS | Status: DC | PRN
  Administered 2024-05-17: 1000 mg via TOPICAL

## 2024-05-17 MED ORDER — ACETAMINOPHEN 500 MG PO TABS: 1000.0000 mg | ORAL_TABLET | Freq: Once | ORAL | Status: DC

## 2024-05-17 MED ORDER — ALBUTEROL SULFATE (2.5 MG/3ML) 0.083% IN NEBU
2.5000 mg | INHALATION_SOLUTION | Freq: Four times a day (QID) | RESPIRATORY_TRACT | Status: DC | PRN
  Administered 2024-05-18 – 2024-05-19 (×3): 2.5 mg via RESPIRATORY_TRACT
  Filled 2024-05-17 (×3): qty 3

## 2024-05-17 MED ORDER — FENTANYL CITRATE (PF) 250 MCG/5ML IJ SOLN
INTRAMUSCULAR | Status: DC | PRN
  Administered 2024-05-17: 100 ug via INTRAVENOUS

## 2024-05-17 MED ORDER — OXYCODONE HCL 5 MG PO TABS
5.0000 mg | ORAL_TABLET | Freq: Once | ORAL | Status: AC | PRN
  Administered 2024-05-17: 5 mg via ORAL

## 2024-05-17 MED ORDER — SODIUM CHLORIDE 0.9% FLUSH
3.0000 mL | Freq: Two times a day (BID) | INTRAVENOUS | Status: DC
  Administered 2024-05-18 (×2): 3 mL via INTRAVENOUS
  Administered 2024-05-19: 10 mL via INTRAVENOUS
  Administered 2024-05-19: 3 mL via INTRAVENOUS
  Administered 2024-05-20 – 2024-05-21 (×2): 10 mL via INTRAVENOUS

## 2024-05-17 MED ORDER — FENTANYL CITRATE (PF) 100 MCG/2ML IJ SOLN
25.0000 ug | INTRAMUSCULAR | Status: DC | PRN
  Administered 2024-05-17 (×3): 50 ug via INTRAVENOUS

## 2024-05-17 MED ORDER — OXYCODONE HCL 5 MG PO TABS
ORAL_TABLET | ORAL | Status: AC
Start: 2024-05-17 — End: ?
  Filled 2024-05-17: qty 1

## 2024-05-17 MED ORDER — CHLORHEXIDINE GLUCONATE 0.12 % MT SOLN
OROMUCOSAL | Status: AC
  Filled 2024-05-17: qty 15

## 2024-05-17 MED ORDER — OXYCODONE HCL 5 MG/5ML PO SOLN: 5.0000 mg | Freq: Once | ORAL | Status: AC | PRN

## 2024-05-17 MED ORDER — EPHEDRINE SULFATE-NACL 50-0.9 MG/10ML-% IV SOSY
PREFILLED_SYRINGE | INTRAVENOUS | Status: DC | PRN
  Administered 2024-05-17: 7.5 mg via INTRAVENOUS
  Administered 2024-05-17 (×2): 5 mg via INTRAVENOUS

## 2024-05-17 MED ORDER — DROPERIDOL 2.5 MG/ML IJ SOLN: 0.6250 mg | Freq: Once | INTRAMUSCULAR | Status: DC | PRN

## 2024-05-17 MED ORDER — ORAL CARE MOUTH RINSE: 15.0000 mL | Freq: Once | OROMUCOSAL | Status: AC

## 2024-05-17 MED ORDER — PHENYLEPHRINE 80 MCG/ML (10ML) SYRINGE FOR IV PUSH (FOR BLOOD PRESSURE SUPPORT)
PREFILLED_SYRINGE | INTRAVENOUS | Status: DC | PRN
  Administered 2024-05-17 (×4): 160 ug via INTRAVENOUS
  Administered 2024-05-17: 80 ug via INTRAVENOUS
  Administered 2024-05-17: 160 ug via INTRAVENOUS

## 2024-05-17 MED ORDER — VASHE WOUND IRRIGATION OPTIME
TOPICAL | Status: DC | PRN
  Administered 2024-05-17: 34 [oz_av]

## 2024-05-17 MED ORDER — PROPOFOL 10 MG/ML IV BOLUS
INTRAVENOUS | Status: DC | PRN
  Administered 2024-05-17: 200 mg via INTRAVENOUS

## 2024-05-17 MED ORDER — CHLORHEXIDINE GLUCONATE 0.12 % MT SOLN
15.0000 mL | Freq: Once | OROMUCOSAL | Status: AC
  Administered 2024-05-17: 15 mL via OROMUCOSAL

## 2024-05-17 MED ORDER — ALBUTEROL SULFATE (2.5 MG/3ML) 0.083% IN NEBU: 2.5000 mg | INHALATION_SOLUTION | Freq: Four times a day (QID) | RESPIRATORY_TRACT | Status: DC | PRN

## 2024-05-17 SURGICAL SUPPLY — 38 items
BAG COUNTER SPONGE SURGICOUNT (BAG) IMPLANT
BLADE SURG 21 STRL SS (BLADE) ×1 IMPLANT
BNDG COHESIVE 4X5 TAN STRL LF (GAUZE/BANDAGES/DRESSINGS) IMPLANT
BNDG COHESIVE 6X5 TAN NS LF (GAUZE/BANDAGES/DRESSINGS) IMPLANT
BNDG COHESIVE 6X5 TAN ST LF (GAUZE/BANDAGES/DRESSINGS) IMPLANT
BNDG GAUZE DERMACEA FLUFF 4 (GAUZE/BANDAGES/DRESSINGS) IMPLANT
CANISTER WOUNDNEG PRESSURE 500 (CANNISTER) IMPLANT
CLEANSER WND VASHE 34 (WOUND CARE) IMPLANT
CLEANSER WND VASHE INSTL 34OZ (WOUND CARE) IMPLANT
COVER SURGICAL LIGHT HANDLE (MISCELLANEOUS) ×2 IMPLANT
DRAPE DERMATAC (DRAPES) IMPLANT
DRAPE U-SHAPE 47X51 STRL (DRAPES) ×1 IMPLANT
DRESSING PREVENA PLUS CUSTOM (GAUZE/BANDAGES/DRESSINGS) IMPLANT
DRESSING VERAFLO CLEANS CC MED (GAUZE/BANDAGES/DRESSINGS) IMPLANT
DRSG ADAPTIC 3X8 NADH LF (GAUZE/BANDAGES/DRESSINGS) ×1 IMPLANT
DURAPREP 26ML APPLICATOR (WOUND CARE) ×1 IMPLANT
ELECTRODE REM PT RTRN 9FT ADLT (ELECTROSURGICAL) IMPLANT
GAUZE PAD ABD 8X10 STRL (GAUZE/BANDAGES/DRESSINGS) IMPLANT
GAUZE SPONGE 4X4 12PLY STRL (GAUZE/BANDAGES/DRESSINGS) IMPLANT
GLOVE BIOGEL PI IND STRL 9 (GLOVE) ×1 IMPLANT
GLOVE SURG ORTHO 9.0 STRL STRW (GLOVE) ×1 IMPLANT
GOWN STRL REUS W/ TWL XL LVL3 (GOWN DISPOSABLE) ×2 IMPLANT
GRAFT SKIN WND MICRO 38 (Tissue) IMPLANT
KIT BASIN OR (CUSTOM PROCEDURE TRAY) ×1 IMPLANT
KIT TURNOVER KIT B (KITS) ×1 IMPLANT
MANIFOLD NEPTUNE II (INSTRUMENTS) ×1 IMPLANT
NS IRRIG 1000ML POUR BTL (IV SOLUTION) ×1 IMPLANT
PACK ORTHO EXTREMITY (CUSTOM PROCEDURE TRAY) ×1 IMPLANT
PAD ARMBOARD POSITIONER FOAM (MISCELLANEOUS) ×2 IMPLANT
PAD NEG PRESSURE SENSATRAC (MISCELLANEOUS) IMPLANT
SET HNDPC FAN SPRY TIP SCT (DISPOSABLE) IMPLANT
STOCKINETTE IMPERVIOUS 9X36 MD (GAUZE/BANDAGES/DRESSINGS) IMPLANT
SUT ETHILON 2 0 PSLX (SUTURE) ×1 IMPLANT
SWAB COLLECTION DEVICE MRSA (MISCELLANEOUS) ×1 IMPLANT
SWAB CULTURE ESWAB REG 1ML (MISCELLANEOUS) IMPLANT
TOWEL GREEN STERILE (TOWEL DISPOSABLE) ×1 IMPLANT
TUBE CONNECTING 12X1/4 (SUCTIONS) ×1 IMPLANT
YANKAUER SUCT BULB TIP NO VENT (SUCTIONS) ×1 IMPLANT

## 2024-05-17 NOTE — Interval H&P Note (Signed)
 History and Physical Interval Note:  05/17/2024 11:50 AM  Deanna Yang  has presented today for surgery, with the diagnosis of Infected Left Clavicle.  The various methods of treatment have been discussed with the patient and family. After consideration of risks, benefits and other options for treatment, the patient has consented to  Procedure(s) with comments: EXCISION, CLAVICLE, DISTAL, OPEN (Left) - LEFT CLAVICLE DEBRIDEMENT as a surgical intervention.  The patient's history has been reviewed, patient examined, no change in status, stable for surgery.  I have reviewed the patient's chart and labs.  Questions were answered to the patient's satisfaction.     Vilma Will V Yousef Huge

## 2024-05-17 NOTE — Anesthesia Preprocedure Evaluation (Addendum)
 Anesthesia Evaluation  Patient identified by MRN, date of birth, ID band Patient awake    Reviewed: Allergy & Precautions, NPO status , Patient's Chart, lab work & pertinent test results  History of Anesthesia Complications Negative for: history of anesthetic complications  Airway Mallampati: II  TM Distance: >3 FB Neck ROM: Full    Dental  (+) Missing,    Pulmonary asthma , sleep apnea , COPD,  oxygen  dependent, former smoker   Pulmonary exam normal        Cardiovascular hypertension, Pt. on medications Normal cardiovascular exam     Neuro/Psych  Headaches  Anxiety Depression Bipolar Disorder      GI/Hepatic Neg liver ROS,GERD  Medicated,,  Endo/Other  negative endocrine ROS    Renal/GU Renal InsufficiencyRenal disease (Cr 1.23)  negative genitourinary   Musculoskeletal  (+) Arthritis ,  Fibromyalgia -Infected Left Clavicle   Abdominal   Peds  Hematology negative hematology ROS (+)   Anesthesia Other Findings    Reproductive/Obstetrics negative OB ROS                              Anesthesia Physical Anesthesia Plan  ASA: 2  Anesthesia Plan: General   Post-op Pain Management: Tylenol  PO (pre-op)*   Induction: Intravenous  PONV Risk Score and Plan: 3 and Treatment may vary due to age or medical condition, Midazolam , Dexamethasone  and Ondansetron   Airway Management Planned: Oral ETT  Additional Equipment: None  Intra-op Plan:   Post-operative Plan: Extubation in OR  Informed Consent: I have reviewed the patients History and Physical, chart, labs and discussed the procedure including the risks, benefits and alternatives for the proposed anesthesia with the patient or authorized representative who has indicated his/her understanding and acceptance.     Dental advisory given  Plan Discussed with: CRNA  Anesthesia Plan Comments:         Anesthesia Quick  Evaluation

## 2024-05-17 NOTE — Care Management Important Message (Signed)
 Important Message  Patient Details  Name: Deanna Yang MRN: [id number] Date of Birth: 14-Sep-1961   Important Message Given:  Yes - Medicare IM     Felix Host 05/17/2024, 12:43 PM

## 2024-05-17 NOTE — Op Note (Signed)
 05/17/2024  2:10 PM  PATIENT:  Deanna Yang    PRE-OPERATIVE DIAGNOSIS:  Infected Left Clavicle  POST-OPERATIVE DIAGNOSIS:  Same  PROCEDURE: Excisional debridement left clavicle with excision skin soft tissue muscle fascia and bone. Application of Kerecis micro graft 38 cm with 1 g vancomycin  powder. Application of customizable wound VAC. Local tissue transfer for wound closure 10 x 4 cm.  SURGEON:  Timothy Ford, MD  PHYSICIAN ASSISTANT:None ANESTHESIA:   General  PREOPERATIVE INDICATIONS:  [name] is a  63 y.o. female with a diagnosis of Infected Left Clavicle who failed conservative measures and elected for surgical management.    The risks benefits and alternatives were discussed with the patient preoperatively including but not limited to the risks of infection, bleeding, nerve injury, cardiopulmonary complications, the need for revision surgery, among others, and the patient was willing to proceed.  OPERATIVE IMPLANTS:   Implant Name Type Inv. Item Serial No. Manufacturer Lot No. LRB No. Used Action  GRAFT SKIN WND MICRO 38 - [id number] Tissue GRAFT SKIN WND MICRO 38  [employer] [id number] Left 1 Implanted    @ENCIMAGES @  OPERATIVE FINDINGS: Tissue margins had good healthy granulation tissue no evidence of persistent infection grossly.  OPERATIVE PROCEDURE: Patient was brought the operating room and underwent general anesthetic.  After adequate level anesthesia obtained patient's left shoulder was prepped using DuraPrep draped into a sterile field a timeout was called.  The wound VAC sponge was removed.  A rondure, 21 blade knife, Cobb elevator was used to excise skin soft tissue muscle bone and fascia.  The residual clavicle bone had healthy petechial bleeding.  No evidence of avascular necrosis in the bone.  The wound was irrigated with Vashe.  Hemostasis was obtained.  The wound bed was filled with 38 cm of Kerecis micro graft and 1 g of vancomycin  powder to  cover a wound surface area 40 cm and 2 cm deep.  The tissue edges were undermined and local tissue transfer was used to close the wound 10 x 4 cm with 2-0 nylon.  A Prevena customizable VAC was applied secured with derma tack this had a good suction fit patient was extubated taken the PACU in stable condition   DISCHARGE PLANNING:  Antibiotic duration: Continue antibiotics discharge antibiotics based on culture sensitivities  Weightbearing: Weightbearing as tolerated  Pain medication: Continue opioid pathway  Dressing care/ Wound VAC: Continue wound VAC at time of discharge with the Prevena plus portable pump.  Ambulatory devices: Not applicable  Discharge to: Anticipate discharge to home.  Follow-up: In the office 1 week post operative.

## 2024-05-17 NOTE — Transfer of Care (Signed)
 Immediate Anesthesia Transfer of Care Note  Patient: Deanna Yang  Procedure(s) Performed: Incision and Drainage Left Clavicle (Left: Chest)  Patient Location: PACU  Anesthesia Type:General  Level of Consciousness: awake  Airway & Oxygen  Therapy: Patient Spontanous Breathing  Post-op Assessment: Report given to RN and Post -op Vital signs reviewed and stable  Post vital signs: Reviewed and stable  Last Vitals:  Vitals Value Taken Time  BP 110/77 05/17/24 1405  Temp    Pulse 87 05/17/24 1409  Resp 16 05/17/24 1409  SpO2 93 % 05/17/24 1409  Vitals shown include unfiled device data.  Last Pain:  Vitals:   05/17/24 1151  TempSrc:   PainSc: 6          Complications: No notable events documented.

## 2024-05-17 NOTE — Anesthesia Procedure Notes (Signed)
 Procedure Name: LMA Insertion Date/Time: 05/17/2024 1:29 PM  Performed by: Merna Aase, CRNAPre-anesthesia Checklist: Patient identified, Patient being monitored, Timeout performed, Emergency Drugs available and Suction available Patient Re-evaluated:Patient Re-evaluated prior to induction Oxygen  Delivery Method: Circle system utilized Preoxygenation: Pre-oxygenation with 100% oxygen  Induction Type: IV induction Ventilation: Mask ventilation without difficulty LMA: LMA inserted LMA Size: 4.0 Tube type: Oral Number of attempts: 1 Placement Confirmation: positive ETCO2 and breath sounds checked- equal and bilateral Tube secured with: Tape Dental Injury: Teeth and Oropharynx as per pre-operative assessment

## 2024-05-17 NOTE — Progress Notes (Signed)
 CSW met with pt regarding SDOH: Food, housing, transportation.  Food: pt does have food needs.  She has applied for food stamps but not yet approved.  She is receiving some financial assistance from [facility] medicare that she can buy food with.  Discussed local food pantries--she confirmed she is in [facility].  Resource list for food pantries provided.  Housing: pt currently staying in a house on her parents property.  Pt describes this as a work in progress that she is fixing up, says it is all she can afford right now.  Does have plumbing, electricity and is livable.  No current need.  Transportation: pt does have working vehicle, does drive.  No current transportation need. Baldo Bonds, MSW, LCSW 6/6/20258:57 AM

## 2024-05-17 NOTE — Interval H&P Note (Signed)
 History and Physical Interval Note:  05/17/2024 6:40 AM  Deanna Yang  has presented today for surgery, with the diagnosis of Infected Left Clavicle.  The various methods of treatment have been discussed with the patient and family. After consideration of risks, benefits and other options for treatment, the patient has consented to  Procedure(s) with comments: EXCISION, CLAVICLE, DISTAL, OPEN (Left) - LEFT CLAVICLE DEBRIDEMENT as a surgical intervention.  The patient's history has been reviewed, patient examined, no change in status, stable for surgery.  I have reviewed the patient's chart and labs.  Questions were answered to the patient's satisfaction.     [name] V Alea Ryer

## 2024-05-17 NOTE — Progress Notes (Signed)
 ID PROGRESS NOTE  Cultures remain negative. Will continue on broad spectrum abtx. Will decide on final regimen on Monday, will likely need picc line over the weekend.  Gerold Kos Levern Reader MD MPH [facility] [phone]

## 2024-05-17 NOTE — Progress Notes (Signed)
 Initial Nutrition Assessment  DOCUMENTATION CODES:   Not applicable  INTERVENTION:   Encourage protein foods on Regular diet once advanced after surgery   Continue Juven BID  Gave pt product information and coupons    NUTRITION DIAGNOSIS:   Increased nutrient needs related to wound healing as evidenced by estimated needs.  GOAL:   Patient will meet greater than or equal to 90% of their needs  MONITOR:   PO intake, Supplement acceptance  REASON FOR ASSESSMENT:   Consult Assessment of nutrition requirement/status  ASSESSMENT:   Pt with PMH of CKD stage 2, depression, COPD, fatty liver, fibromyalgia, GERD, HTN, neuropathy, OSA, manic depression, and ORIF L clavicle x 2 03/12/24 and 04/09/24 now admitted with acute osteomyelitis of L clavicle.   Spoke with pt who provides her nutrition hx. She reports that due to periods of depression pt's appetite is up and down.  She eats a lot of fruits and vegetables. She has [employer] that gives her money for food/utilities but this does not cover 100%.  Pt does not have a stove but does have a toaster oven, microwave, and recently bought a hot plate. She currently has a small refrigerator. We reviewed high protein foods to ensure she gets adequate protein in her diet. She has her groceries delivered by [employer]. She uses [facility] for her medications and states they may give her a discount on Juven, will give provide her info.   She is currently NPO for repeat surgery today.  Meal Completion: 100% Pt reports that past fractures, including clavicle, were from sleep walking.   6/4 - s/p partial excision L clavicle for acute osteomyelitis  Medications reviewed and include: juven, protonix  Rocephin vancomycin   Labs reviewed:  Glucose: 105-142  Wound VAC: 50 ml out x 24 hours  NUTRITION - FOCUSED PHYSICAL EXAM:  Flowsheet Row Most Recent Value  Orbital Region No depletion  Upper Arm Region No depletion  Thoracic and Lumbar  Region No depletion  Buccal Region No depletion  Temple Region No depletion  Clavicle Bone Region No depletion  Clavicle and Acromion Bone Region No depletion  Scapular Bone Region No depletion  Dorsal Hand No depletion  Patellar Region No depletion  Anterior Thigh Region No depletion  Posterior Calf Region No depletion  Edema (RD Assessment) None  Hair Reviewed  Mouth Reviewed  Skin Reviewed  Nails Reviewed       Diet Order:   Diet Order             Diet NPO time specified Except for: Sips with Meds  Diet effective midnight                   EDUCATION NEEDS:   Education needs have been addressed  Skin:  Skin Assessment: Reviewed RN Assessment (L shoulder incision)  Last BM:  6/4  Height:   Ht Readings from Last 1 Encounters:  05/15/24 5\' 5"  (1.651 m)    Weight:   Wt Readings from Last 1 Encounters:  05/15/24 93.9 kg    BMI:  Body mass index is 34.45 kg/m.  Estimated Nutritional Needs:   Kcal:  1800-2000  Protein:  100-120 grams  Fluid:  >1.8 L/day  Randine Butcher., RD, LDN, CNSC See AMiON for contact information

## 2024-05-17 NOTE — Progress Notes (Signed)
 Pt came back from surgery with new IV in right hand.  Pt had no PIV in her upper right arm.  Pt c/o of pain from the old PIV that was in her right arm.  Pt's arm does appear slightly swollen and splotchy red from wrist to upper arm.  Pt also has redness on left upper arm as well.  Notified MD and RPH. Will continue to monitor.

## 2024-05-17 NOTE — Anesthesia Postprocedure Evaluation (Signed)
 Anesthesia Post Note  Patient: Deanna Yang  Procedure(s) Performed: Incision and Drainage Left Clavicle (Left: Chest)     Patient location during evaluation: PACU Anesthesia Type: General Level of consciousness: awake and alert Pain management: pain level controlled Vital Signs Assessment: post-procedure vital signs reviewed and stable Respiratory status: spontaneous breathing, nonlabored ventilation and respiratory function stable Cardiovascular status: blood pressure returned to baseline Postop Assessment: no apparent nausea or vomiting Anesthetic complications: no   No notable events documented.  Last Vitals:  Vitals:   05/17/24 1430 05/17/24 1445  BP: 109/80 110/71  Pulse: 85 89  Resp: 14 17  Temp:  36.8 C  SpO2: 92% 92%                Rayfield Cairo

## 2024-05-18 ENCOUNTER — Other Ambulatory Visit: Payer: Self-pay

## 2024-05-18 DIAGNOSIS — B9689 Other specified bacterial agents as the cause of diseases classified elsewhere: Secondary | ICD-10-CM | POA: Diagnosis not present

## 2024-05-18 DIAGNOSIS — M86112 Other acute osteomyelitis, left shoulder: Secondary | ICD-10-CM | POA: Diagnosis not present

## 2024-05-18 DIAGNOSIS — T847XXA Infection and inflammatory reaction due to other internal orthopedic prosthetic devices, implants and grafts, initial encounter: Secondary | ICD-10-CM | POA: Diagnosis not present

## 2024-05-18 LAB — CBC
HCT: 31.9 % — ABNORMAL LOW (ref 36.0–46.0)
Hemoglobin: 10 g/dL — ABNORMAL LOW (ref 12.0–15.0)
MCH: 28.2 pg (ref 26.0–34.0)
MCHC: 31.3 g/dL (ref 30.0–36.0)
MCV: 89.9 fL (ref 80.0–100.0)
Platelets: 329 10*3/uL (ref 150–400)
RBC: 3.55 MIL/uL — ABNORMAL LOW (ref 3.87–5.11)
RDW: 14.3 % (ref 11.5–15.5)
WBC: 8.8 10*3/uL (ref 4.0–10.5)
nRBC: 0 % (ref 0.0–0.2)

## 2024-05-18 LAB — BASIC METABOLIC PANEL WITH GFR
Anion gap: 9 (ref 5–15)
BUN: 8 mg/dL (ref 8–23)
CO2: 26 mmol/L (ref 22–32)
Calcium: 7.5 mg/dL — ABNORMAL LOW (ref 8.9–10.3)
Chloride: 103 mmol/L (ref 98–111)
Creatinine, Ser: 0.9 mg/dL (ref 0.44–1.00)
GFR, Estimated: >60 mL/min (ref >60)
Glucose, Bld: 130 mg/dL — ABNORMAL HIGH (ref 70–99)
Potassium: 3.7 mmol/L (ref 3.5–5.1)
Sodium: 138 mmol/L (ref 135–145)

## 2024-05-18 NOTE — Progress Notes (Signed)
 Occupational Therapy Treatment Patient Details Name: Deanna Yang MRN: [id number] DOB: Jan 10, 1961 Today's Date: 05/18/2024   History of present illness Pt is a 63 yo female s/p excision distal clavicle for acute osteomyelitis, removal of hardware 6/4. ORIF L clavicle x2 03/12/24 and 04/09/24. Other PMH includes anxiety, CKD, COPD, depression, fibromyalgia, GERD, neuropathy in hands, OSA, R ORIF radial fx 2023, R tibia IMN 2023.   OT comments  Pt. Seen for skilled OT treatment session.  Able to complete bed mobility in/out with S.  Able to perform bsc transfer to/from with S.  Reviewed fall prevention strategies. Pt. Reports its related to sleep walking which she will be following up on after d/c from hospital along with apt. For cpap getting refitted for improving tolerance for wearing which could also aide in her decreasing sleep walking and falling.  Cont. With acute OT POC.        If plan is discharge home, recommend the following:  A little help with walking and/or transfers;A little help with bathing/dressing/bathroom;Assistance with cooking/housework;Assist for transportation;Help with stairs or ramp for entrance   Equipment Recommendations  Other (comment)    Recommendations for Other Services      Precautions / Restrictions Precautions Precautions: Fall       Mobility Bed Mobility Overal bed mobility: Needs Assistance Bed Mobility: Supine to Sit, Sit to Supine     Supine to sit: Supervision, HOB elevated, Used rails Sit to supine: Supervision, HOB elevated, Used rails   General bed mobility comments: Supervision for safety    Transfers Overall transfer level: Needs assistance Equipment used: None Transfers: Bed to chair/wheelchair/BSC Sit to Stand: Supervision     Step pivot transfers: Supervision     General transfer comment: Supervision for safety, slow to rise and sit     Balance                                           ADL either  performed or assessed with clinical judgement   ADL Overall ADL's : Needs assistance/impaired                   Upper Body Dressing Details (indicate cue type and reason): pt. dressed wearing tank top, states she dons over bles and pulls up and pulls arms through without assist   Lower Body Dressing Details (indicate cue type and reason): pt. wearing pajama shorts, reports she donned w/o assistance Toilet Transfer: Supervision/safety;BSC/3in1   Toileting- Clothing Manipulation and Hygiene: Supervision/safety;Sitting/lateral lean;Sit to/from stand         General ADL Comments: reports all her falls happen during sleep walking and she has apt. sch. for this. also has apt. that needs to be rescheduled for her cpap to get a different attachment so she can tolerate wearing it.  wanting information on how to get new powered w/c.  cm states it comes from primary md. passed the answer along to rn to give the answer to pt.    Extremity/Trunk Assessment              Vision       Restaurant manager, fast food Communication: No apparent difficulties   Cognition Arousal: Alert Behavior During Therapy: WFL for tasks assessed/performed               OT - Cognition Comments: Pt  AAOx4 and pleasant throughout session. Pt cognition WFL for tasks assessed.                 Following commands: Intact        Cueing   Cueing Techniques: Verbal cues  Exercises      Shoulder Instructions       General Comments      Pertinent Vitals/ Pain       Pain Assessment Pain Assessment: No/denies pain  Home Living                                          Prior Functioning/Environment              Frequency  Min 2X/week        Progress Toward Goals  OT Goals(current goals can now be found in the care plan section)  Progress towards OT goals: Progressing toward goals     Plan      Co-evaluation                  AM-PAC OT "6 Clicks" Daily Activity     Outcome Measure   Help from another person eating meals?: None Help from another person taking care of personal grooming?: A Little Help from another person toileting, which includes using toliet, bedpan, or urinal?: A Little Help from another person bathing (including washing, rinsing, drying)?: A Little Help from another person to put on and taking off regular upper body clothing?: A Little Help from another person to put on and taking off regular lower body clothing?: A Little 6 Click Score: 19    End of Session Equipment Utilized During Treatment: Other (comment) (bsc)  OT Visit Diagnosis: Unsteadiness on feet (R26.81);Muscle weakness (generalized) (M62.81);Pain;Other (comment)   Activity Tolerance Patient tolerated treatment well   Patient Left in bed;with call bell/phone within reach   Nurse Communication Other (comment) (secure chat rn to give pt. answer about her question about powered w/c also pt.s comments about previous thoughts and conversation with psychiatrist of being "easier if she wasn't here")        Time: 1233-1249 OT Time Calculation (min): 16 min  Charges: OT General Charges $OT Visit: 1 Visit OT Treatments $Self Care/Home Management : 8-22 mins  Howell Macintosh, COTA/L Acute Rehabilitation [phone]   Leory Rands Lorraine-COTA/L  05/18/2024, 2:56 PM

## 2024-05-18 NOTE — Progress Notes (Signed)
 Patient ID: Deanna Yang, female   DOB: December 21, 1960, 63 y.o.   MRN: [id number] Patient is postoperative day 1 repeat debridement left clavicle.  Cultures are pending.  Wound bed has healthy viable tissue at this time.  There is 50 cc in the wound VAC canister.  Possible discharge to home on Monday.  There is vancomycin  powder and Kerecis tissue graft in the wound bed.  Plan for discharge on antibiotics.

## 2024-05-18 NOTE — Progress Notes (Addendum)
 [facility]    Date of Admission:  05/15/2024     ID: Deanna Yang is a 63 y.o. female with left clavicle osteomyelitis with HW involvement Principal Problem:   Infection Active Problems:   Acute osteomyelitis of clavicle, left (HCC)   Hardware complicating wound infection (HCC)    Subjective: Still burning at left clavicle site  Medications:   atorvastatin   20 mg Oral Daily   DULoxetine   60 mg Oral BID   gabapentin   1,800 mg Oral QHS   lamoTRIgine   100 mg Oral QHS   lisinopril   10 mg Oral q AM   montelukast   10 mg Oral QHS   morphine   30 mg Oral Q12H   nutrition supplement (JUVEN)  1 packet Oral BID BM   pantoprazole   40 mg Oral Daily   prazosin   5 mg Oral QHS   QUEtiapine   300 mg Oral QHS   sodium chloride  flush  3-10 mL Intravenous Q12H    Objective: Vital signs in last 24 hours: Temp:  [98 F (36.7 C)-98.8 F (37.1 C)] 98.8 F (37.1 C) (06/07 0756) Pulse Rate:  [72-89] 79 (06/07 0756) Resp:  [11-17] 16 (06/07 0756) BP: (104-126)/(62-80) 119/62 (06/07 0756) SpO2:  [91 %-96 %] 94 % (06/07 0756)   Physical Exam  Constitutional:  oriented to person, place, and time. appears well-developed and well-nourished. No distress.  HENT: [place]/AT, PERRLA, no scleral icterus Mouth/Throat: Oropharynx is clear and moist. No oropharyngeal exudate.  Chest wall: left chest wall wound vac Cardiovascular: Normal rate, regular rhythm and normal heart sounds. Exam reveals no gallop and no friction rub.  No murmur heard.  Pulmonary/Chest: Effort normal and breath sounds normal. No respiratory distress.  has no wheezes.  Neck = supple, no nuchal rigidity Abdominal: Soft. Bowel sounds are normal.  exhibits no distension. There is no tenderness.  Lymphadenopathy: no cervical adenopathy. No axillary adenopathy Neurological: alert and oriented to person, place, and time.  Skin: Skin is warm and dry. No rash noted. No erythema.  Psychiatric: a normal mood and  affect.  behavior is normal.    Lab Results Recent Labs    05/17/24 0656 05/18/24 0447  WBC 9.0 8.8  HGB 11.5* 10.0*  HCT 35.6* 31.9*  NA 143 138  K 4.8 3.7  CL 109 103  CO2 25 26  BUN 7* 8  CREATININE 1.23* 0.90   Sedimentation Rate Recent Labs    05/16/24 1230  ESRSEDRATE 13   C-Reactive Protein Recent Labs    05/16/24 1230  CRP <0.5    Microbiology: Culture negative Studies/Results: US  EKG SITE RITE Result Date: 05/18/2024 If Site Rite image not attached, placement could not be confirmed due to current cardiac rhythm.    Assessment/Plan: Left clavicle osteomyelitis -- S/p debridement x 2. Continues to be culture negative  Will get picc line today Plan to discharge on ceftriaxone  and daptomycin to simplify her regimen. Will change vanco to daptomycin. Will check baseline ck in the morning Plan for 4 wk of iv then transition to oral abtx Anticipate d/c on Monday  Home health orders below:  Diagnosis: Left clavicle osteomyelitis  Culture Result: culture negative  Allergies  Allergen Reactions   Ambien [Zolpidem Tartrate] Shortness Of Breath and Swelling    Tongue swelling, throat swelling.    Beta Adrenergic Blockers Anaphylaxis   Fish Allergy Shortness Of Breath and Rash   Iodinated Contrast Media Itching    Benadryl  50MG  prophylaxis before and  after and patient reports she did fine   Latex Itching and Other (See Comments)    Blisters skin   Metoprolol  Other (See Comments)    ANY MEDICATION THAT ENDS IN -OLOL-   The patient is asthmatic.   Penicillins Shortness Of Breath, Rash and Other (See Comments)        Red Dye #40 (Allura Red) Hives and Shortness Of Breath    Contrast    Shellfish Allergy Anaphylaxis   Sulfa Antibiotics Hives and Shortness Of Breath   Amoxicillin-Pot Clavulanate Nausea And Vomiting   Aspirin  Other (See Comments)    Blisters on tongue from higher doses - tolerates 81 mg    Dye Fdc Red  [Red Dye #2 (Amaranth)] Hives     X ray DYE  (BENADRYL  50 MG PROPHYLAXIS AND AFTER AND  SHE DID FINE, PER PATIENT.).   Tape Dermatitis and Rash    Adhesive    Talwin [Pentazocine] Other (See Comments)    Unknown reaction   Levofloxacin  Rash and Other (See Comments)    headache    OPAT Orders Discharge antibiotics to be given via PICC line Discharge antibiotics: Per pharmacy protocol daptomycin at 8mg /kg/day plus ceftriaxone  2gm IV daily  Aim for Vancomycin  trough 15-20 or AUC 400-550 (unless otherwise indicated) Duration: 4 wk, using 6/4 as day 1 End Date: July 2nd   [facility] Care Per Protocol:  Home health RN for IV administration and teaching; PICC line care and labs.    Labs weekly while on IV antibiotics: _x_ CBC with differential _x_ BMP _x_ CRP _x_ ESR  _x_ CK  _x_ Please pull PIC at completion of IV antibiotics  Fax weekly labs to [phone]  Clinic Follow Up Appt: In 4 wk  @ [facility]   [facility] Pager: [phone]  05/18/2024, 1:47 PM        Gerold Kos. Levern Reader MD MPH [facility] [phone]

## 2024-05-18 NOTE — TOC Progression Note (Signed)
 Transition of Care [facility]) - Progression Note    Patient Details  Name: Deanna Yang MRN: [id number] Date of Birth: 1961/07/19  Transition of [facility]) CM/SW Contact  Jannine Meo, RN Phone Number: 05/18/2024, 2:15 PM  Clinical Narrative:   Message from Oralee Billow with [facility], she received message from ID provider to prepare for discharge Monday with home IV abx needs.    Expected Discharge Plan: Home w Home Health Services Barriers to Discharge: Continued Medical Work up  Expected Discharge Plan and Services   Discharge Planning Services: CM Consult Post Acute Care Choice: Home Health Living arrangements for the past 2 months: Single Family Home                 DME Arranged: Walker rolling         HH Arranged: PT           Social Determinants of Health (SDOH) Interventions SDOH Screenings   Food Insecurity: Food Insecurity Present (05/16/2024)  Housing: High Risk (05/16/2024)  Transportation Needs: No Transportation Needs (05/16/2024)  Recent Concern: Transportation Needs - Unmet Transportation Needs (05/08/2024)   Received from [facility]  Utilities: Not At Risk (05/16/2024)  Financial Resource Strain: Medium Risk (05/08/2024)   Received from [facility]  Physical Activity: Unknown (05/08/2024)   Received from [facility]  Social Connections: Socially Isolated (05/08/2024)   Received from [facility]  Stress: Stress Concern Present (05/08/2024)   Received from [facility]  Tobacco Use: Medium Risk (05/17/2024)    Readmission Risk Interventions     No data to display

## 2024-05-18 NOTE — Plan of Care (Signed)
   Problem: Education: Goal: Knowledge of General Education information will improve Description: Including pain rating scale, medication(s)/side effects and non-pharmacologic comfort measures Outcome: Progressing   Problem: Pain Managment: Goal: General experience of comfort will improve and/or be controlled Outcome: Progressing   Problem: Safety: Goal: Ability to remain free from injury will improve Outcome: Progressing

## 2024-05-18 NOTE — Progress Notes (Signed)
 Physical Therapy Treatment Patient Details Name: Deanna Yang MRN: [id number] DOB: 1961-04-02 Today's Date: 05/18/2024   History of Present Illness Pt is a 63 yo female s/p excision distal clavicle for acute osteomyelitis, removal of hardware 6/4. ORIF L clavicle x2 03/12/24 and 04/09/24. 6/6 s/p Excisional debridement left clavicle with excision skin soft tissue muscle fascia and bone, application of Kerecis micro graft 38 cm with 1 g vancomycin  powder.  Application of customizable wound VAC. Local tissue transfer for wound closure 10 x 4 cm.  Other PMH includes anxiety, CKD, COPD on O2 PRN and while sleeping, depression, fibromyalgia, GERD, neuropathy in hands, OSA, R ORIF radial fx 2023, R tibia IMN 2023.    PT Comments  Pt received in supine, c/o severe L clavicular pain (per RN not due for pain meds for another ~90 mins), pt agreeable to therapy session with encouragement and good effort. Pt reports history of many recent falls and describes onset of falls as "spots in my vision" and feeling weak suddenly, so exertional and some orthostatic BP readings taken. BP reading very elevated midway through gait trial, so re-checked BP once pt seated EOB upon return to room and reading very hypotensive (on same arm) post-exertion. Checked additional BP standing at bedside and even lower, so pt returned to supine as RT arriving to room and pt wheezing/whistling sounding breath and pt requesting breathing tx. RN notified of pt VS, especially given recent history of falls. Pt also reports episodes of severe chest pain at home prior to this admission that sometimes coincide with symptoms/falls but currently reporting no chest pain. Pt continues to benefit from PT services to progress toward functional mobility goals.   Vital Signs  BP (!) 127/112 (118)  BP Location Left Arm  BP Method Automatic  Patient Position (if appropriate) Standing (standing halfway through gait trial, both arms checked (R arm was  reading 154/137 (145) and lower reading was this one on LUE with arm relaxed and held at chest height by PTA) Pt denies headache or lightheadedness but had been a bit restless and not fully appearing to relax her arm, so plan to re-check BP upon return to room.   Orthostatic Sitting  BP- Sitting (!) 88/64 (73) (sitting EOB post-exertion)  Pulse- Sitting 88  Orthostatic Standing at 0 minutes  BP- Standing at 0 minutes (!) 80/40 (50) (standing at bedside after seated reading above taken; pt immediately returned to supine)  Pulse- Standing at 0 minutes 80  Oxygen  Therapy  SpO2 90 %  O2 Device Room Air  Patient Activity (if Appropriate) Ambulating     If plan is discharge home, recommend the following: A little help with walking and/or transfers;A little help with bathing/dressing/bathroom;Assistance with cooking/housework   Can travel by Training and development officer (2 wheels)    Recommendations for Other Services       Precautions / Restrictions Precautions Precautions: Fall Recall of Precautions/Restrictions: Impaired Precaution/Restrictions Comments: watch BP, pt recent history of 20+ falls in past 6 months per her report Restrictions Weight Bearing Restrictions Per Provider Order: No Other Position/Activity Restrictions: LUE WBAT per Julio Ohm note post-L clavicular I&D surgical excision and wound vac placement on 6/6     Mobility  Bed Mobility Overal bed mobility: Needs Assistance Bed Mobility: Supine to Sit, Sit to Supine     Supine to sit: Supervision, HOB elevated Sit to supine: Supervision, HOB elevated   General bed mobility comments: Supervision  for safety given multiple lines and pt mild impulsivity/restlessness    Transfers Overall transfer level: Needs assistance Equipment used: Rolling walker (2 wheels) Transfers: Sit to/from Stand Sit to Stand: Supervision           General transfer comment: cues for safer UE  placement; used RW for mobility given pt hx of many falls and intermittent appearing lethargy; RN notified of pt presentation.    Ambulation/Gait Ambulation/Gait assistance: Contact guard assist, Supervision Gait Distance (Feet): 250 Feet (including standing break ~5 mins halfway through to check her BP in each arm with elevated BP reading) Assistive device: Rolling walker (2 wheels) Gait Pattern/deviations: Step-through pattern, Decreased stride length Gait velocity: decr     General Gait Details: RW for safety as pt has history of falls and reports RW makes her feel stable to progress distance. no buckling or LOB but pt appears intermittently more lethargic (hx sleep apnea) and pt wheezing more toward end of trial with Spo2 reading ~90% (hx COPD), so PTA called respiratory therapy per pt request.   Stairs Stairs:  (pt reports level entry then states "I need to get my ramp fixed")           Wheelchair Mobility     Tilt Bed    Modified Rankin (Stroke Patients Only)       Balance Overall balance assessment: Needs assistance, History of Falls Sitting-balance support: No upper extremity supported, Feet supported Sitting balance-Leahy Scale: Fair Sitting balance - Comments: pt appears intermittently lethargic/falling asleep and restless but no overt LOB sitting EOB   Standing balance support: No upper extremity supported, Bilateral upper extremity supported, During functional activity Standing balance-Leahy Scale:  (Poor to Fair) Standing balance comment: Fair static standing but used RW mostly for gait due to pt hx of many recent falls and lethargic presentation this date with pt often closing her eyes for a few seconds then appearing more alert.                            Communication Communication Communication: No apparent difficulties  Cognition Arousal: Obtunded, Alert (variable appearing alertness; pt at times closes eyes for a few moments and appears to  be falling asleep, then awakens; restless) Behavior During Therapy: Restless, Impulsive (mild impulsivity but following instructions when cued to wait)   PT - Cognitive impairments: Safety/Judgement                       PT - Cognition Comments: Decreased safety awareness/insight as pt reports "why do I need bed alarm?" after explaining to PTA that she has had 20+ falls in past 6 months where her vision goes dark, and just prior to bed alarm being set, pt BP was dangerously low when orthostatic BP checked, pt also having multiple lines (wound vac + IV) in place. Following commands: Intact      Cueing Cueing Techniques: Verbal cues  Exercises      General Comments General comments (skin integrity, edema, etc.): SpO2 unreliable signal due to thick synthetic nails but when signal achieved, reading 88% and above on RA, mostly 90-93% with exertional tasks. HR 80's bpm. See BP reading in comments above, possible orthostatic hypotension although BP varies widely when taken on exertion and then when pt more rested.      Pertinent Vitals/Pain Pain Assessment Pain Assessment: 0-10 Pain Score: 8  Pain Location: L clavicle/surgical site prior to activity Pain Descriptors /  Indicators: Discomfort, Grimacing, Guarding Pain Intervention(s): Monitored during session, Repositioned, Patient requesting pain meds-RN notified, Other (comment) (Per RN, pain meds not yet due; wheezing with breathing and BP variable, RN notified and placed into flowsheet)    Home Living                          Prior Function            PT Goals (current goals can now be found in the care plan section) Acute Rehab PT Goals Patient Stated Goal: "To stop falling so much and go home" PT Goal Formulation: With patient Time For Goal Achievement: 05/30/24 Progress towards PT goals: Progressing toward goals    Frequency    Min 3X/week      PT Plan      Co-evaluation              AM-PAC  PT "6 Clicks" Mobility   Outcome Measure  Help needed turning from your back to your side while in a flat bed without using bedrails?: A Little Help needed moving from lying on your back to sitting on the side of a flat bed without using bedrails?: A Little Help needed moving to and from a bed to a chair (including a wheelchair)?: A Little Help needed standing up from a chair using your arms (e.g., wheelchair or bedside chair)?: A Little Help needed to walk in hospital room?: A Little Help needed climbing 3-5 steps with a railing? : A Lot (defer post-exertion due to unstable BP) 6 Click Score: 17    End of Session Equipment Utilized During Treatment: Gait belt Activity Tolerance: Patient tolerated treatment well;Treatment limited secondary to medical complications (Comment);Other (comment) (see BP readings; pt reports no significant symptoms from this and progressed activity well) Patient left: in bed;with call bell/phone within reach;with bed alarm set;Other (comment) (respiratory therapy arriving to her room for breathing tx per pt request) Nurse Communication: Mobility status;Patient requests pain meds;Precautions;Other (comment) (BP noted to be very elevated when pt exerting herself (pt asymptomatic) but when checked seated/standing after return to room, pt BP reading severely low, RN notified; pt wheezing/breath whistling significantly post-exertion) PT Visit Diagnosis: Other abnormalities of gait and mobility (R26.89);Muscle weakness (generalized) (M62.81)     Time: 1610-9604 PT Time Calculation (min) (ACUTE ONLY): 38 min  Charges:    $Gait Training: 23-37 mins $Therapeutic Activity: 8-22 mins PT General Charges $$ ACUTE PT VISIT: 1 Visit                     Nyazia Canevari P., PTA Acute Rehabilitation Services Secure Chat Preferred 9a-5:30pm Office: [phone]    Mariel Shope [facility] 05/18/2024, 4:13 PM

## 2024-05-19 LAB — BASIC METABOLIC PANEL WITH GFR
Anion gap: 10 (ref 5–15)
BUN: 8 mg/dL (ref 8–23)
CO2: 24 mmol/L (ref 22–32)
Calcium: 8.2 mg/dL — ABNORMAL LOW (ref 8.9–10.3)
Chloride: 106 mmol/L (ref 98–111)
Creatinine, Ser: 1.02 mg/dL — ABNORMAL HIGH (ref 0.44–1.00)
GFR, Estimated: >60 mL/min (ref >60)
Glucose, Bld: 131 mg/dL — ABNORMAL HIGH (ref 70–99)
Potassium: 3.7 mmol/L (ref 3.5–5.1)
Sodium: 140 mmol/L (ref 135–145)

## 2024-05-19 LAB — CBC
HCT: 34.1 % — ABNORMAL LOW (ref 36.0–46.0)
Hemoglobin: 10.7 g/dL — ABNORMAL LOW (ref 12.0–15.0)
MCH: 28.5 pg (ref 26.0–34.0)
MCHC: 31.4 g/dL (ref 30.0–36.0)
MCV: 90.9 fL (ref 80.0–100.0)
Platelets: 329 10*3/uL (ref 150–400)
RBC: 3.75 MIL/uL — ABNORMAL LOW (ref 3.87–5.11)
RDW: 14 % (ref 11.5–15.5)
WBC: 8.4 10*3/uL (ref 4.0–10.5)
nRBC: 0 % (ref 0.0–0.2)

## 2024-05-19 LAB — CK: Total CK: 88 U/L (ref 38–234)

## 2024-05-19 MED ORDER — SODIUM CHLORIDE 0.9 % IV SOLN
2.0000 g | INTRAVENOUS | Status: DC
  Administered 2024-05-19 – 2024-05-20 (×2): 2 g via INTRAVENOUS
  Filled 2024-05-19 (×2): qty 20

## 2024-05-19 MED ORDER — SODIUM CHLORIDE 0.9 % IV SOLN
8.0000 mg/kg | Freq: Every day | INTRAVENOUS | Status: DC
  Administered 2024-05-19 – 2024-05-21 (×3): 600 mg via INTRAVENOUS
  Filled 2024-05-19 (×3): qty 12

## 2024-05-19 MED ORDER — SODIUM CHLORIDE 0.9 % IV SOLN: 8.0000 mg/kg | Freq: Every day | INTRAVENOUS | Status: DC

## 2024-05-19 NOTE — Progress Notes (Signed)
 PHARMACY CONSULT NOTE FOR:  OUTPATIENT  PARENTERAL ANTIBIOTIC THERAPY (OPAT)  Indication: Osteomyelitis Regimen:  Ceftriaxone  2 grams IV every 24 hours Daptomycin 8 mg/kg (600 mg) every 24 hours End date: 06/12/2024  IV antibiotic discharge orders are pended. To discharging provider:  please sign these orders via discharge navigator,  Select New Orders & click on the button choice - Manage This Unsigned Work.     Thank you for allowing pharmacy to be a part of this patient's care.   Victoria Grass, PharmD 05/19/2024 1:33 PM  **Pharmacist phone directory can be found on [email or web address] listed under [facility]**

## 2024-05-19 NOTE — Progress Notes (Signed)
  Subjective: Patient stable.  Hemovac has about 75 cc.  Has excellent range of motion of the arm on the left-hand side.   Objective: Vital signs in last 24 hours: Temp:  [97.4 F (36.3 C)-98.8 F (37.1 C)] 98.1 F (36.7 C) (06/08 0708) Pulse Rate:  [69-86] 86 (06/08 0708) Resp:  [16-18] 17 (06/08 0422) BP: (80-143)/(40-112) 143/102 (06/08 0708) SpO2:  [90 %-94 %] 90 % (06/08 0708)  Intake/Output from previous day: 06/07 0701 - 06/08 0700 In: 240 [P.O.:240] Out: 500 [Urine:500] Intake/Output this shift: No intake/output data recorded.  Exam:  Sensation intact distally Intact pulses distally Compartment soft  Labs: Recent Labs    05/17/24 0656 05/18/24 0447 05/19/24 0518  HGB 11.5* 10.0* 10.7*   Recent Labs    05/18/24 0447 05/19/24 0518  WBC 8.8 8.4  RBC 3.55* 3.75*  HCT 31.9* 34.1*  PLT 329 329   Recent Labs    05/18/24 0447 05/19/24 0518  NA 138 140  K 3.7 3.7  CL 103 106  CO2 26 24  BUN 8 8  CREATININE 0.90 1.02*  GLUCOSE 130* 131*  CALCIUM  7.5* 8.2*   No results for input(s): "LABPT", "INR" in the last 72 hours.  Assessment/Plan: Plan at this time is continue with IV antibiotics.  Anticipate discharge tomorrow.  No fevers and white count is normal.  Marykay Snipes 05/19/2024, 7:15 AM

## 2024-05-19 NOTE — Plan of Care (Signed)

## 2024-05-19 NOTE — Plan of Care (Signed)
   Problem: Clinical Measurements: Goal: Respiratory complications will improve Outcome: Progressing   Problem: Activity: Goal: Risk for activity intolerance will decrease Outcome: Progressing   Problem: Coping: Goal: Level of anxiety will decrease Outcome: Progressing

## 2024-05-19 NOTE — Progress Notes (Signed)
 Pharmacy Antibiotic Note  Deanna Yang is a 63 y.o. female admitted on 05/15/2024 with osteomyelitis .  Pharmacy has been consulted for Daptomycin dosing.  Plan: Daptomycin 8 mg/kg IV every 24 hours (600 mg, using adjusted body weight for BMI 34) Ceftriaxone  2 gram IV every 24 hours Monitor clinical progress, cultures/sensitivities, renal function, abx plan ID following and plan is for 4 wk of IV then transition to oral antibiotics   Height: 5\' 5"  (165.1 cm) Weight: 93.8 kg (206 lb 12.7 oz) IBW/kg (Calculated) : 57  Temp (24hrs), Avg:97.9 F (36.6 C), Min:97.4 F (36.3 C), Max:98.2 F (36.8 C)  Recent Labs  Lab 05/15/24 0926 05/16/24 0608 05/17/24 0656 05/18/24 0447 05/19/24 0518  WBC 7.6 10.4 9.0 8.8 8.4  CREATININE 1.45* 1.22* 1.23* 0.90 1.02*    Estimated Creatinine Clearance: 65.6 mL/min (A) (by C-G formula based on SCr of 1.02 mg/dL (H)).    Allergies  Allergen Reactions   Ambien [Zolpidem Tartrate] Shortness Of Breath and Swelling    Tongue swelling, throat swelling.    Beta Adrenergic Blockers Anaphylaxis   Fish Allergy Shortness Of Breath and Rash   Iodinated Contrast Media Itching    Benadryl  50MG  prophylaxis before and after and patient reports she did fine   Latex Itching and Other (See Comments)    Blisters skin   Metoprolol  Other (See Comments)    ANY MEDICATION THAT ENDS IN -OLOL-   The patient is asthmatic.   Penicillins Shortness Of Breath, Rash and Other (See Comments)        Red Dye #40 (Allura Red) Hives and Shortness Of Breath    Contrast    Shellfish Allergy Anaphylaxis   Sulfa Antibiotics Hives and Shortness Of Breath   Amoxicillin-Pot Clavulanate Nausea And Vomiting   Aspirin  Other (See Comments)    Blisters on tongue from higher doses - tolerates 81 mg    Dye Fdc Red  [Red Dye #2 (Amaranth)] Hives    X ray DYE  (BENADRYL  50 MG PROPHYLAXIS AND AFTER AND  SHE DID FINE, PER PATIENT.).   Tape Dermatitis and Rash    Adhesive    Talwin  [Pentazocine] Other (See Comments)    Unknown reaction   Levofloxacin  Rash and Other (See Comments)    headache    Antimicrobials this admission: 6/5 vancomycin  >> 6/7 6/5 ceftriaxone  >> (7/2) 6/8 Daptomycin >> (7/2)    Thank you for allowing pharmacy to be a part of this patient's care.   [name] [name], PharmD 05/19/2024 1:28 PM  **Pharmacist phone directory can be found on [email or web address] listed under [facility]**

## 2024-05-20 ENCOUNTER — Encounter (HOSPITAL_COMMUNITY): Payer: Self-pay | Admitting: Orthopedic Surgery

## 2024-05-20 LAB — AEROBIC/ANAEROBIC CULTURE W GRAM STAIN (SURGICAL/DEEP WOUND)
Culture: NO GROWTH
Gram Stain: NONE SEEN

## 2024-05-20 MED ORDER — CHLORHEXIDINE GLUCONATE CLOTH 2 % EX PADS
6.0000 | MEDICATED_PAD | Freq: Every day | CUTANEOUS | Status: DC
  Administered 2024-05-20 – 2024-05-21 (×2): 6 via TOPICAL

## 2024-05-20 MED ORDER — SODIUM CHLORIDE 0.9% FLUSH
10.0000 mL | Freq: Two times a day (BID) | INTRAVENOUS | Status: DC
  Administered 2024-05-20 – 2024-05-21 (×2): 10 mL

## 2024-05-20 MED ORDER — SODIUM CHLORIDE 0.9% FLUSH: 10.0000 mL | INTRAVENOUS | Status: DC | PRN

## 2024-05-20 NOTE — Plan of Care (Signed)

## 2024-05-20 NOTE — Progress Notes (Addendum)
 [facility]  Subjective  - No new complaints   Objective (!) 109/59 78 97.9 F (36.6 C) (Oral) 18 90% No intake or output data in the 24 hours ending 05/20/24 0907  Left clavicle wound vac to suction Afebrile Lungs non labored breathing  Assessment/Planning: S/P Partial excision left clavicle for acute osteomyelitis. Removal of failed deep hardware complicating wound infection.  Application of wound VAC cleanse choice sponge x 1.  Application Kerecis micro graft 38 cm with 1 g vancomycin  powder. Return to OR 05/17/24 Excisional debridement left clavicle with excision skin soft tissue muscle fascia and bone. Application of Kerecis micro graft 38 cm with 1 g vancomycin  powder. Application of customizable wound VAC.  Cultures pending no organisms to date ID consulted : Pending picc line today Plan to discharge on ceftriaxone  and daptomycin to simplify her regimen. Will change vanco to daptomycin. Will check baseline ck in the morning Plan for 4 wk of iv then transition to oral abtx  Vac will be discontinued at discharge and dry dressing placed.  Rocky Cipro 05/20/2024 9:07 AM --  Laboratory Lab Results: Recent Labs    05/18/24 0447 05/19/24 0518  WBC 8.8 8.4  HGB 10.0* 10.7*  HCT 31.9* 34.1*  PLT 329 329   BMET Recent Labs    05/18/24 0447 05/19/24 0518  NA 138 140  K 3.7 3.7  CL 103 106  CO2 26 24  GLUCOSE 130* 131*  BUN 8 8  CREATININE 0.90 1.02*  CALCIUM  7.5* 8.2*    COAG Lab Results  Component Value Date   INR 1.0 11/12/2022   No results found for: "PTT"

## 2024-05-20 NOTE — Progress Notes (Signed)
 Peripherally Inserted Central Catheter Placement  The IV Nurse has discussed with the patient and/or persons authorized to consent for the patient, the purpose of this procedure and the potential benefits and risks involved with this procedure.  The benefits include less needle sticks, lab draws from the catheter, and the patient may be discharged home with the catheter. Risks include, but not limited to, infection, bleeding, blood clot (thrombus formation), and puncture of an artery; nerve damage and irregular heartbeat and possibility to perform a PICC exchange if needed/ordered by physician.  Alternatives to this procedure were also discussed.  Bard Power PICC patient education guide, fact sheet on infection prevention and patient information card has been provided to patient /or left at bedside.    PICC Placement Documentation  PICC Single Lumen 05/20/24 Right Basilic 38 cm (Active)  Indication for Insertion or Continuance of Line Home intravenous therapies (PICC only) 05/20/24 1000  Exposed Catheter (cm) 0 cm 05/20/24 1000  Site Assessment Clean, Dry, Intact 05/20/24 1000  Line Status Flushed;Saline locked;Blood return noted 05/20/24 1000  Dressing Type Transparent;Securing device 05/20/24 1000  Dressing Status Antimicrobial disc/dressing in place;Clean, Dry, Intact 05/20/24 1000  Line Care Connections checked and tightened 05/20/24 1000  Line Adjustment (NICU/IV Team Only) No 05/20/24 1000  Dressing Intervention New dressing;Adhesive placed at insertion site (IV team only) 05/20/24 1000  Dressing Change Due 05/27/24 05/20/24 1000       Roseanne Cones Renee 05/20/2024, 10:37 AM

## 2024-05-21 MED ORDER — HEPARIN SOD (PORK) LOCK FLUSH 100 UNIT/ML IV SOLN
250.0000 [IU] | INTRAVENOUS | Status: AC | PRN
  Administered 2024-05-21: 250 [IU]

## 2024-05-21 MED ORDER — DAPTOMYCIN IV (FOR PTA / DISCHARGE USE ONLY)
600.0000 mg | INTRAVENOUS | 0 refills | Status: AC
Start: 2024-05-21 — End: 2024-06-14

## 2024-05-21 MED ORDER — SODIUM CHLORIDE 0.9 % IV SOLN
2.0000 g | INTRAVENOUS | Status: DC
  Administered 2024-05-21: 2 g via INTRAVENOUS
  Filled 2024-05-21: qty 20

## 2024-05-21 MED ORDER — CEFTRIAXONE IV (FOR PTA / DISCHARGE USE ONLY)
2.0000 g | INTRAVENOUS | 0 refills | Status: AC
Start: 2024-05-21 — End: 2024-06-14

## 2024-05-21 NOTE — Progress Notes (Signed)
 Physical Therapy Treatment Patient Details Name: Deanna Yang MRN: [id number] DOB: 1961-04-11 Today's Date: 05/21/2024   History of Present Illness Pt is a 63 yo female s/p excision distal clavicle for acute osteomyelitis, removal of hardware 6/4. ORIF L clavicle x2 03/12/24 and 04/09/24. 6/6 s/p Excisional debridement left clavicle with excision skin soft tissue muscle fascia and bone, application of Kerecis micro graft 38 cm with 1 g vancomycin  powder.  Application of customizable wound VAC. Local tissue transfer for wound closure 10 x 4 cm.  Other PMH includes anxiety, CKD, COPD on O2 PRN and while sleeping, depression, fibromyalgia, GERD, neuropathy in hands, OSA, R ORIF radial fx 2023, R tibia IMN 2023.    PT Comments  Pt has made good progress toward goals.  When focused pt able to mobilize steadily and should be safe at home, he does not get out a lot.    If plan is discharge home, recommend the following: A little help with walking and/or transfers;A little help with bathing/dressing/bathroom;Assistance with cooking/housework   Can travel by private Automotive engineer (2 wheels)    Recommendations for Other Services       Precautions / Restrictions Precautions Precautions: Fall Restrictions Other Position/Activity Restrictions: LUE WBAT per Julio Ohm note post-L clavicular I&D surgical excision and wound vac placement on 6/6     Mobility  Bed Mobility Overal bed mobility: Modified Independent       Supine to sit: Modified independent (Device/Increase time)          Transfers Overall transfer level: Modified independent     Sit to Stand: Modified independent (Device/Increase time)                Ambulation/Gait Ambulation/Gait assistance: Supervision Gait Distance (Feet): 200 Feet (x2) Assistive device: None Gait Pattern/deviations: Step-through pattern, Decreased stride length Gait velocity: decr Gait velocity  interpretation: <1.31 ft/sec, indicative of household ambulator   General Gait Details: pt able to ambulate in a homelike environment , scan, back up and change directions somewhat abruptly without falls, but also is a little impulsive and moves at times without thought..  Pt  maintains 89-92% on RA during gait, but experiences notable wheeze as she gets tired and asks to sit.   Stairs             Wheelchair Mobility     Tilt Bed    Modified Rankin (Stroke Patients Only)       Balance Overall balance assessment: Needs assistance, History of Falls Sitting-balance support: No upper extremity supported, Feet supported Sitting balance-Leahy Scale: Fair     Standing balance support: No upper extremity supported, Bilateral upper extremity supported, During functional activity Standing balance-Leahy Scale: Fair                              Hotel manager: No apparent difficulties  Cognition Arousal: Alert Behavior During Therapy: WFL for tasks assessed/performed                             Following commands: Intact      Cueing Cueing Techniques: Verbal cues  Exercises      General Comments        Pertinent Vitals/Pain Pain Assessment Pain Assessment: Faces Faces Pain Scale: Hurts little more Pain Location: L clavicle/surgical site prior to activity Pain Descriptors /  Indicators: Discomfort, Grimacing, Guarding Pain Intervention(s): Monitored during session    Home Living                          Prior Function            PT Goals (current goals can now be found in the care plan section) Acute Rehab PT Goals Patient Stated Goal: "To stop falling so much and go home" PT Goal Formulation: With patient Time For Goal Achievement: 05/30/24 Potential to Achieve Goals: Good Progress towards PT goals: Progressing toward goals    Frequency    Min 3X/week      PT Plan      Co-evaluation               AM-PAC PT "6 Clicks" Mobility   Outcome Measure  Help needed turning from your back to your side while in a flat bed without using bedrails?: A Little Help needed moving from lying on your back to sitting on the side of a flat bed without using bedrails?: A Little Help needed moving to and from a bed to a chair (including a wheelchair)?: A Little Help needed standing up from a chair using your arms (e.g., wheelchair or bedside chair)?: A Little Help needed to walk in hospital room?: A Little Help needed climbing 3-5 steps with a railing? : A Little 6 Click Score: 18    End of Session   Activity Tolerance: Patient tolerated treatment well;Treatment limited secondary to medical complications (Comment);Other (comment) Patient left: in bed;with call bell/phone within reach;with bed alarm set;Other (comment) Nurse Communication: Mobility status;Patient requests pain meds;Precautions;Other (comment) PT Visit Diagnosis: Other abnormalities of gait and mobility (R26.89);Muscle weakness (generalized) (M62.81)     Time: 1610-9604 PT Time Calculation (min) (ACUTE ONLY): 24 min  Charges:    $Gait Training: 8-22 mins $Therapeutic Activity: 8-22 mins PT General Charges $$ ACUTE PT VISIT: 1 Visit                     05/21/2024  Deanna Batters., PT Acute Rehabilitation Services [phone]  (office)   Deanna Yang 05/21/2024, 3:45 PM

## 2024-05-21 NOTE — TOC Progression Note (Signed)
 Transition of Care [facility]) - Progression Note    Patient Details  Name: Deanna Yang MRN: [id number] Date of Birth: 1960/12/31  Transition of Care [facility]) CM/SW Contact  Mirian Ames Doloris Freund, RN Phone Number: 05/21/2024, 11:49 AM  Clinical Narrative:    Case Manager spoke with Kay Parson with [facility], she is working on arranging IV antibiotic for patient. [facility] will provide RN and HHPT/OT. Patient will receive education regarding IV antibiotics this afternoon.     Expected Discharge Plan: Home w Home Health Services Barriers to Discharge: No Barriers Identified  Expected Discharge Plan and Services   Discharge Planning Services: CM Consult Post Acute Care Choice: Home Health Living arrangements for the past 2 months: Single Family Home Expected Discharge Date: 05/21/24               DME Arranged: Otho Blitz rolling         HH Arranged: RN, OT, IV Antibiotics HH Agency: [facility], [facility] Date HH Agency Contacted: 05/21/24 Time HH Agency Contacted: 1000 Representative spoke with at [facility] Agency: Loetta Ringer   Social Determinants of Health (SDOH) Interventions SDOH Screenings   Food Insecurity: Food Insecurity Present (05/16/2024)  Housing: High Risk (05/16/2024)  Transportation Needs: No Transportation Needs (05/16/2024)  Recent Concern: Transportation Needs - Unmet Transportation Needs (05/08/2024)   Received from [facility]  Utilities: Not At Risk (05/16/2024)  Financial Resource Strain: Medium Risk (05/08/2024)   Received from [facility]  Physical Activity: Unknown (05/08/2024)   Received from [facility]  Social Connections: Socially Isolated (05/08/2024)   Received from [facility]  Stress: Stress Concern Present (05/08/2024)   Received from [facility]  Tobacco Use: Medium Risk (05/17/2024)    Readmission Risk Interventions     No data to display

## 2024-05-21 NOTE — Progress Notes (Signed)
 Pt has pulled off tubing connected to wound vac dressing. Pt states it is supposed to be removed today. Do I need to replace? Also there is some swelling to right forearm/hand where PICC line is located. I consulted with IV team, they suggest an ultrasound to rule out a clot.  Arnaldo Betters, PA on unit to assess and made aware of the above.

## 2024-05-21 NOTE — Progress Notes (Signed)
 Hand off report given to oncoming nurse. All questions addressed

## 2024-05-21 NOTE — Discharge Summary (Signed)
 Physician Discharge Summary  Patient ID: Deanna Yang MRN: [id number] DOB/AGE: [birth year] 63 y.o.  Admit date: 05/15/2024 Discharge date: 05/21/2024  Admission Diagnoses:  Principal Problem:   Infection Active Problems:   Acute osteomyelitis of clavicle, left (HCC)   Hardware complicating wound infection (HCC)   Discharge Diagnoses:  Same  Past Medical History:  Diagnosis Date   Anxiety    Arthritis    Asthma    Chronic kidney disease    stage 2   COPD (chronic obstructive pulmonary disease) (HCC)    Depression    Dyspnea    On 3L oxygen  when laying down   Fatty liver 07/23/2018   Fibromyalgia    GERD (gastroesophageal reflux disease)    Headache    HSV infection    Hypertension    Manic depression (HCC)    Neuropathy    hands   OSA (obstructive sleep apnea)    does not use CPAP   Pneumonia    "couple of times"    Surgeries: Procedure(s): Incision and Drainage Left Clavicle on 05/17/2024   Consultants:   Discharged Condition: Improved  Hospital Course: Deanna Yang is an 63 y.o. female who was admitted 05/15/2024 with a chief complaint of No chief complaint on file. , and found to have a diagnosis of Infection.  They were brought to the operating room on 05/17/2024 and underwent the above named procedures.    They were given perioperative antibiotics:  Anti-infectives (From admission, onward)    Start     Dose/Rate Route Frequency Ordered Stop   05/21/24 0000  cefTRIAXone  (ROCEPHIN ) IVPB        2 g Intravenous Every 24 hours 05/21/24 0735 06/14/24 2359   05/21/24 0000  daptomycin (CUBICIN) IVPB        600 mg Intravenous Every 24 hours 05/21/24 0735 06/14/24 2359   05/19/24 1800  cefTRIAXone  (ROCEPHIN ) 2 g in sodium chloride  0.9 % 100 mL IVPB        2 g 200 mL/hr over 30 Minutes Intravenous Every 24 hours 05/19/24 1330     05/19/24 1600  DAPTOmycin (CUBICIN) 600 mg in sodium chloride  0.9 % IVPB  Status:  Discontinued        8 mg/kg  71.7 kg  (Adjusted) 124 mL/hr over 30 Minutes Intravenous Daily 05/19/24 1309 05/19/24 1330   05/19/24 1600  DAPTOmycin (CUBICIN) 600 mg in sodium chloride  0.9 % IVPB        8 mg/kg  71.7 kg (Adjusted) 124 mL/hr over 30 Minutes Intravenous Daily 05/19/24 1330     05/17/24 1352  vancomycin  (VANCOCIN ) powder  Status:  Discontinued          As needed 05/17/24 1352 05/17/24 1400   05/16/24 1800  vancomycin  (VANCOCIN ) IVPB 1000 mg/200 mL premix  Status:  Discontinued        1,000 mg 200 mL/hr over 60 Minutes Intravenous Every 24 hours 05/15/24 1545 05/19/24 1309   05/15/24 1800  cefTRIAXone  (ROCEPHIN ) 2 g in sodium chloride  0.9 % 100 mL IVPB  Status:  Discontinued        2 g 200 mL/hr over 30 Minutes Intravenous Every 24 hours 05/15/24 1535 05/19/24 1330   05/15/24 1645  vancomycin  (VANCOREADY) IVPB 1500 mg/300 mL        1,500 mg 150 mL/hr over 120 Minutes Intravenous  Once 05/15/24 1545 05/15/24 2037   05/15/24 1212  vancomycin  (VANCOCIN ) powder  Status:  Discontinued  As needed 05/15/24 1212 05/15/24 1223   05/15/24 0815  ceFAZolin  (ANCEF ) IVPB 2g/100 mL premix        2 g 200 mL/hr over 30 Minutes Intravenous On call to O.R. 05/15/24 0810 05/15/24 1145     .  They were given compression stockings, early ambulation, and chemoprophylaxis for DVT prophylaxis.  They benefited maximally from their hospital stay and there were no complications.    Recent vital signs:  Vitals:   05/20/24 2243 05/21/24 0606  BP: (!) 139/110 124/71  Pulse:  81  Resp:    Temp:  97.9 F (36.6 C)  SpO2:  95%    Recent laboratory studies:  Results for orders placed or performed during the hospital encounter of 05/15/24  Basic metabolic panel per protocol   Collection Time: 05/15/24  9:26 AM  Result Value Ref Range   Sodium 137 135 - 145 mmol/L   Potassium 3.6 3.5 - 5.1 mmol/L   Chloride 106 98 - 111 mmol/L   CO2 22 22 - 32 mmol/L   Glucose, Bld 123 (H) 70 - 99 mg/dL   BUN 10 8 - 23 mg/dL    Creatinine, Ser 1.61 (H) 0.44 - 1.00 mg/dL   Calcium  8.3 (L) 8.9 - 10.3 mg/dL   GFR, Estimated 41 (L) >60 mL/min   Anion gap 9 5 - 15  CBC per protocol   Collection Time: 05/15/24  9:26 AM  Result Value Ref Range   WBC 7.6 4.0 - 10.5 K/uL   RBC 4.10 3.87 - 5.11 MIL/uL   Hemoglobin 11.6 (L) 12.0 - 15.0 g/dL   HCT 09.6 04.5 - 40.9 %   MCV 87.8 80.0 - 100.0 fL   MCH 28.3 26.0 - 34.0 pg   MCHC 32.2 30.0 - 36.0 g/dL   RDW 81.1 91.4 - 78.2 %   Platelets 328 150 - 400 K/uL   nRBC 0.0 0.0 - 0.2 %  Aerobic/Anaerobic Culture w Gram Stain (surgical/deep wound)   Collection Time: 05/15/24 12:02 PM   Specimen: Soft Tissue, Other  Result Value Ref Range   Specimen Description TISSUE    Special Requests NONE    Gram Stain NO WBC SEEN NO ORGANISMS SEEN     Culture      No growth aerobically or anaerobically. Performed at [facility], [address] [place], [place] [address]    Report Status 05/20/2024 FINAL   Basic metabolic panel   Collection Time: 05/16/24  6:08 AM  Result Value Ref Range   Sodium 141 135 - 145 mmol/L   Potassium 3.9 3.5 - 5.1 mmol/L   Chloride 108 98 - 111 mmol/L   CO2 24 22 - 32 mmol/L   Glucose, Bld 142 (H) 70 - 99 mg/dL   BUN 8 8 - 23 mg/dL   Creatinine, Ser 3.08 (H) 0.44 - 1.00 mg/dL   Calcium  8.3 (L) 8.9 - 10.3 mg/dL   GFR, Estimated 50 (L) >60 mL/min   Anion gap 9 5 - 15  CBC   Collection Time: 05/16/24  6:08 AM  Result Value Ref Range   WBC 10.4 4.0 - 10.5 K/uL   RBC 3.99 3.87 - 5.11 MIL/uL   Hemoglobin 11.4 (L) 12.0 - 15.0 g/dL   HCT 65.7 (L) 84.6 - 96.2 %   MCV 89.7 80.0 - 100.0 fL   MCH 28.6 26.0 - 34.0 pg   MCHC 31.8 30.0 - 36.0 g/dL   RDW 95.2 84.1 - 32.4 %  Platelets 348 150 - 400 K/uL   nRBC 0.0 0.0 - 0.2 %  Sedimentation rate   Collection Time: 05/16/24 12:30 PM  Result Value Ref Range   Sed Rate 13 0 - 22 mm/hr  C-reactive protein   Collection Time: 05/16/24 12:30 PM  Result Value Ref Range   CRP <0.5 <1.0 mg/dL  Basic  metabolic panel   Collection Time: 05/17/24  6:56 AM  Result Value Ref Range   Sodium 143 135 - 145 mmol/L   Potassium 4.8 3.5 - 5.1 mmol/L   Chloride 109 98 - 111 mmol/L   CO2 25 22 - 32 mmol/L   Glucose, Bld 105 (H) 70 - 99 mg/dL   BUN 7 (L) 8 - 23 mg/dL   Creatinine, Ser 4.78 (H) 0.44 - 1.00 mg/dL   Calcium  8.5 (L) 8.9 - 10.3 mg/dL   GFR, Estimated 50 (L) >60 mL/min   Anion gap 9 5 - 15  CBC   Collection Time: 05/17/24  6:56 AM  Result Value Ref Range   WBC 9.0 4.0 - 10.5 K/uL   RBC 3.94 3.87 - 5.11 MIL/uL   Hemoglobin 11.5 (L) 12.0 - 15.0 g/dL   HCT 29.5 (L) 62.1 - 30.8 %   MCV 90.4 80.0 - 100.0 fL   MCH 29.2 26.0 - 34.0 pg   MCHC 32.3 30.0 - 36.0 g/dL   RDW 65.7 84.6 - 96.2 %   Platelets 332 150 - 400 K/uL   nRBC 0.0 0.0 - 0.2 %  Basic metabolic panel   Collection Time: 05/18/24  4:47 AM  Result Value Ref Range   Sodium 138 135 - 145 mmol/L   Potassium 3.7 3.5 - 5.1 mmol/L   Chloride 103 98 - 111 mmol/L   CO2 26 22 - 32 mmol/L   Glucose, Bld 130 (H) 70 - 99 mg/dL   BUN 8 8 - 23 mg/dL   Creatinine, Ser 9.52 0.44 - 1.00 mg/dL   Calcium  7.5 (L) 8.9 - 10.3 mg/dL   GFR, Estimated >84 >13 mL/min   Anion gap 9 5 - 15  CBC   Collection Time: 05/18/24  4:47 AM  Result Value Ref Range   WBC 8.8 4.0 - 10.5 K/uL   RBC 3.55 (L) 3.87 - 5.11 MIL/uL   Hemoglobin 10.0 (L) 12.0 - 15.0 g/dL   HCT 24.4 (L) 01.0 - 27.2 %   MCV 89.9 80.0 - 100.0 fL   MCH 28.2 26.0 - 34.0 pg   MCHC 31.3 30.0 - 36.0 g/dL   RDW 53.6 64.4 - 03.4 %   Platelets 329 150 - 400 K/uL   nRBC 0.0 0.0 - 0.2 %  Basic metabolic panel   Collection Time: 05/19/24  5:18 AM  Result Value Ref Range   Sodium 140 135 - 145 mmol/L   Potassium 3.7 3.5 - 5.1 mmol/L   Chloride 106 98 - 111 mmol/L   CO2 24 22 - 32 mmol/L   Glucose, Bld 131 (H) 70 - 99 mg/dL   BUN 8 8 - 23 mg/dL   Creatinine, Ser 7.42 (H) 0.44 - 1.00 mg/dL   Calcium  8.2 (L) 8.9 - 10.3 mg/dL   GFR, Estimated >59 >56 mL/min   Anion gap 10 5 - 15   CBC   Collection Time: 05/19/24  5:18 AM  Result Value Ref Range   WBC 8.4 4.0 - 10.5 K/uL   RBC 3.75 (L) 3.87 - 5.11 MIL/uL   Hemoglobin 10.7 (L) 12.0 - 15.0  g/dL   HCT 21.3 (L) 08.6 - 57.8 %   MCV 90.9 80.0 - 100.0 fL   MCH 28.5 26.0 - 34.0 pg   MCHC 31.4 30.0 - 36.0 g/dL   RDW 46.9 62.9 - 52.8 %   Platelets 329 150 - 400 K/uL   nRBC 0.0 0.0 - 0.2 %  CK   Collection Time: 05/19/24  5:18 AM  Result Value Ref Range   Total CK 88 38 - 234 U/L    Discharge Medications:   Allergies as of 05/21/2024       Reactions   Ambien [zolpidem Tartrate] Shortness Of Breath, Swelling   Tongue swelling, throat swelling.   Beta Adrenergic Blockers Anaphylaxis   Fish Allergy Shortness Of Breath, Rash   Iodinated Contrast Media Itching   Benadryl  50MG  prophylaxis before and after and patient reports she did fine   Latex Itching, Other (See Comments)   Blisters skin   Metoprolol  Other (See Comments)   ANY MEDICATION THAT ENDS IN -OLOL-   The patient is asthmatic.   Penicillins Shortness Of Breath, Rash, Other (See Comments)      Red Dye #40 (allura Red) Hives, Shortness Of Breath   Contrast    Shellfish Allergy Anaphylaxis   Sulfa Antibiotics Hives, Shortness Of Breath   Amoxicillin-pot Clavulanate Nausea And Vomiting   Aspirin  Other (See Comments)   Blisters on tongue from higher doses - tolerates 81 mg   Dye Fdc Red  [red Dye #2 (amaranth)] Hives   X ray DYE  (BENADRYL  50 MG PROPHYLAXIS AND AFTER AND  SHE DID FINE, PER PATIENT.).   Tape Dermatitis, Rash   Adhesive   Talwin [pentazocine] Other (See Comments)   Unknown reaction   Levofloxacin  Rash, Other (See Comments)   headache        Medication List     TAKE these medications    acetaminophen  500 MG tablet Commonly known as: TYLENOL  Take 1 tablet (500 mg total) by mouth every 6 (six) hours as needed for mild pain (pain score 1-3) or moderate pain (pain score 4-6).   acyclovir  ointment 5 % Commonly known as:  ZOVIRAX  Apply 1 application topically every 3 (three) hours as needed (mouth sores).   albuterol  (2.5 MG/3ML) 0.083% nebulizer solution Commonly known as: PROVENTIL  Take 2.5 mg by nebulization every 4 (four) hours as needed for shortness of breath or wheezing.   albuterol  108 (90 Base) MCG/ACT inhaler Commonly known as: VENTOLIN  HFA Inhale 2 puffs into the lungs every 6 (six) hours as needed for wheezing or shortness of breath.   atorvastatin  20 MG tablet Commonly known as: LIPITOR Take 20 mg by mouth daily.   cefTRIAXone  IVPB Commonly known as: ROCEPHIN  Inject 2 g into the vein daily for 24 days. Indication:  osteomyelitis First Dose: No Last Day of Therapy:  06/12/2024 Labs - Once weekly:  CBC/D and BMP, Labs - Once weekly: ESR and CRP Method of administration: IV Push Method of administration may be changed at the discretion of home infusion pharmacist based upon assessment of the patient and/or caregiver's ability to self-administer the medication ordered.   cephALEXin 500 MG capsule Commonly known as: KEFLEX Take 2,000 mg by mouth See admin instructions. TAKE 4 CAPSULES (2000 MG) BY MOUTH 1 HR PRIOR TO DENTAL PROCEDURE   daptomycin IVPB Commonly known as: CUBICIN Inject 600 mg into the vein daily for 24 days. Indication:  Osteomyelitis First Dose: No Last Day of Therapy:  06/12/2024 Labs - Once weekly:  CBC/D, BMP, and CPK Labs - Once weekly: ESR and CRP Method of administration: IV Push Method of administration may be changed at the discretion of home infusion pharmacist based upon assessment of the patient and/or caregiver's ability to self-administer the medication ordered.   diazepam  10 MG tablet Commonly known as: VALIUM  Take 10 mg by mouth See admin instructions. TAKE 1 TABLET BY MOUTH 1 HR PRIOR TO DENTAL PROCEDURE, DO NOT DRIVE OR OPERATE HEAVY MACHINERY   DULoxetine  60 MG capsule Commonly known as: CYMBALTA  Take 60 mg by mouth 2 (two) times daily.    EPINEPHrine  0.3 mg/0.3 mL Soaj injection Commonly known as: EPI-PEN Inject 0.3 mg into the muscle as needed for anaphylaxis.   gabapentin  600 MG tablet Commonly known as: NEURONTIN  Take 1,800 mg by mouth at bedtime.   hydrOXYzine  50 MG tablet Commonly known as: ATARAX  Take 50 mg by mouth 2 (two) times daily as needed for anxiety.   Immune Globulin 10% 10 GM/100ML Soln Inject into the vein every 30 (thirty) days.   lamoTRIgine  100 MG tablet Commonly known as: LAMICTAL  Take 100 mg by mouth at bedtime.   lisinopril  10 MG tablet Commonly known as: ZESTRIL  Take 10 mg by mouth in the morning.   montelukast  10 MG tablet Commonly known as: SINGULAIR  Take 10 mg by mouth at bedtime.   morphine  30 MG 12 hr tablet Commonly known as: MS CONTIN  Take 1 tablet (30 mg total) by mouth every 12 (twelve) hours.   nystatin  powder Generic drug: nystatin  Apply 1 application topically 3 (three) times daily as needed (blistering sores under breast).   oxyCODONE -acetaminophen  10-325 MG tablet Commonly known as: PERCOCET Take 1 tablet by mouth every 6 (six) hours as needed for pain. Hold for sedation What changed: when to take this   OXYGEN  Inhale 3 L into the lungs See admin instructions. When laying down   pantoprazole  40 MG tablet Commonly known as: PROTONIX  Take 40 mg by mouth daily.   prazosin  5 MG capsule Commonly known as: MINIPRESS  Take 5 mg by mouth at bedtime.   promethazine  12.5 MG tablet Commonly known as: PHENERGAN  Take 25 mg by mouth every 8 (eight) hours as needed for nausea or vomiting.   QUEtiapine  300 MG tablet Commonly known as: SEROQUEL  Take 300 mg by mouth at bedtime.   tiZANidine  4 MG tablet Commonly known as: ZANAFLEX  Take 8 mg by mouth 3 (three) times daily as needed for muscle spasms.               Durable Medical Equipment  (From admission, onward)           Start     Ordered   05/16/24 1338  For home use only DME Walker rolling  Once        Question Answer Comment  Walker: With 5 Inch Wheels   Patient needs a walker to treat with the following condition Weakness      05/16/24 1337              Discharge Care Instructions  (From admission, onward)           Start     Ordered   05/21/24 0000  Change dressing on IV access line weekly and PRN  (Home infusion instructions - [facility] )        05/21/24 0735   05/21/24 0000  Change dressing (specify)       Comments: Dressing change: 1-2 times per day using dry guaze  as needed.   05/21/24 0736            Diagnostic Studies: US  EKG SITE RITE Result Date: 05/18/2024 If Site Rite image not attached, placement could not be confirmed due to current cardiac rhythm.   Disposition: Discharge disposition: 01-Home or Self Care       Discharge Instructions     [facility] pharmacist to adjust dose for Vancomycin , Aminoglycosides and other anti-infective therapies as requested by physician.   Complete by: As directed    [facility] to provide Cath Flo 2mg    Complete by: As directed    Administer for PICC line occlusion and as ordered by physician for other access device issues.   Anaphylaxis Kit: Provided to treat any anaphylactic reaction to the medication being provided to the patient if First Dose or when requested by physician   Complete by: As directed    Epinephrine  1mg /ml vial / amp: Administer 0.3mg  (0.54ml) subcutaneously once for moderate to severe anaphylaxis, nurse to call physician and pharmacy when reaction occurs and call 911 if needed for immediate care   Diphenhydramine  50mg /ml IV vial: Administer 25-50mg  IV/IM PRN for first dose reaction, rash, itching, mild reaction, nurse to call physician and pharmacy when reaction occurs   Sodium Chloride  0.9% NS 500ml IV: Administer if needed for hypovolemic blood pressure drop or as ordered by physician after call to physician with anaphylactic reaction   Call MD for:   redness, tenderness, or signs of infection (pain, swelling, bleeding, redness, odor or green/yellow discharge around incision site)   Complete by: As directed    Call MD for:  severe or increased pain, loss or decreased feeling  in affected limb(s)   Complete by: As directed    Call MD for:  temperature >100.5   Complete by: As directed    Change dressing (specify)   Complete by: As directed    Dressing change: 1-2 times per day using dry guaze as needed.   Change dressing on IV access line weekly and PRN   Complete by: As directed    Flush IV access with Sodium Chloride  0.9% and Heparin  10 units/ml or 100 units/ml   Complete by: As directed    Home infusion instructions - [facility]   Complete by: As directed    Instructions: Flush IV access with Sodium Chloride  0.9% and Heparin  10units/ml or 100units/ml   Change dressing on IV access line: Weekly and PRN   Instructions Cath Flo 2mg : Administer for PICC Line occlusion and as ordered by physician for other access device   [facility] pharmacist to adjust dose for: Vancomycin , Aminoglycosides and other anti-infective therapies as requested by physician   Method of administration may be changed at the discretion of home infusion pharmacist based upon assessment of the patient and/or caregiver's ability to self-administer the medication ordered   Complete by: As directed    Resume previous diet   Complete by: As directed    may wash over wound with mild soap and water    Complete by: As directed         Follow-up Information     Timothy Ford, MD. Call in 1 week(s).   Specialty: Orthopedic Surgery Contact information: [address] [place]  [place] [address] [phone]                  Signed: Rocky Cipro 05/21/2024, 7:37 AM

## 2024-05-21 NOTE — Progress Notes (Signed)
 [facility]  Subjective  - ready to go home   Objective 124/71 81 97.9 F (36.6 C) (Oral) 18 95%  Intake/Output Summary (Last 24 hours) at 05/21/2024 0727 Last data filed at 05/21/2024 0442 Gross per 24 hour  Intake 802.18 ml  Output --  Net 802.18 ml    Right UE PICC line placed yesterday, mod edema in the right hand, IV infiltrated 05/19/24.  M/V/N intact right UE Left clavicle incision healing well with SS drainage. Vac D/C'd Dry dressing placed over incision  Assessment/Planning: S/P Partial excision left clavicle for acute osteomyelitis. Removal of failed deep hardware complicating wound infection.  Application of wound VAC cleanse choice sponge x 1.  Application Kerecis micro graft 38 cm with 1 g vancomycin  powder. Return to OR 05/17/24 Excisional debridement left clavicle with excision skin soft tissue muscle fascia and bone. Application of Kerecis micro graft 38 cm with 1 g vancomycin  powder. Application of customizable wound VAC.   Cultures pending no organisms to date ID consulted : Pending picc line today Plan to discharge on ceftriaxone  and daptomycin to simplify her regimen. Will change vanco to daptomycin. Will check baseline ck in the morning Plan for 4 wk of iv then transition to oral abtx  Right hand edema due to IV infiltration 05/19/24 Right UE PICC line placed Pending [facility] RN for IV antibiotics Plan for discharge today.  She has pain management, so no new pain medication will be prescribed.  Rocky Cipro 05/21/2024 7:27 AM --  Laboratory Lab Results: Recent Labs    05/19/24 0518  WBC 8.4  HGB 10.7*  HCT 34.1*  PLT 329   BMET Recent Labs    05/19/24 0518  NA 140  K 3.7  CL 106  CO2 24  GLUCOSE 131*  BUN 8  CREATININE 1.02*  CALCIUM  8.2*    COAG Lab Results  Component Value Date   INR 1.0 11/12/2022   No results found for: "PTT"

## 2024-05-21 NOTE — Progress Notes (Signed)
 Patient reports R forearm and R hand with edema not present prior to PICC placement. Assessment revealed 1+ edema to R hand and R arm. Primary RN notified to contact MD for  doppler of RUA post PICC placement to R/O possible thrombus.

## 2024-05-21 NOTE — Discharge Instructions (Signed)
 Elevate your right hand and move it until swelling goes down.   Dry dressing as need to the left clavicle area. May shower daily with soap and water .

## 2024-05-22 ENCOUNTER — Telehealth: Payer: Self-pay

## 2024-05-22 DIAGNOSIS — Z79899 Other long term (current) drug therapy: Secondary | ICD-10-CM | POA: Diagnosis not present

## 2024-05-22 DIAGNOSIS — G4733 Obstructive sleep apnea (adult) (pediatric): Secondary | ICD-10-CM | POA: Diagnosis not present

## 2024-05-22 DIAGNOSIS — Z9981 Dependence on supplemental oxygen: Secondary | ICD-10-CM | POA: Diagnosis not present

## 2024-05-22 DIAGNOSIS — J4489 Other specified chronic obstructive pulmonary disease: Secondary | ICD-10-CM | POA: Diagnosis not present

## 2024-05-22 DIAGNOSIS — Z792 Long term (current) use of antibiotics: Secondary | ICD-10-CM | POA: Diagnosis not present

## 2024-05-22 DIAGNOSIS — K219 Gastro-esophageal reflux disease without esophagitis: Secondary | ICD-10-CM | POA: Diagnosis not present

## 2024-05-22 DIAGNOSIS — K760 Fatty (change of) liver, not elsewhere classified: Secondary | ICD-10-CM | POA: Diagnosis not present

## 2024-05-22 DIAGNOSIS — G894 Chronic pain syndrome: Secondary | ICD-10-CM | POA: Diagnosis not present

## 2024-05-22 DIAGNOSIS — M86112 Other acute osteomyelitis, left shoulder: Secondary | ICD-10-CM | POA: Diagnosis not present

## 2024-05-22 DIAGNOSIS — N1831 Chronic kidney disease, stage 3a: Secondary | ICD-10-CM | POA: Diagnosis not present

## 2024-05-22 DIAGNOSIS — S42022D Displaced fracture of shaft of left clavicle, subsequent encounter for fracture with routine healing: Secondary | ICD-10-CM | POA: Diagnosis not present

## 2024-05-22 DIAGNOSIS — Z87891 Personal history of nicotine dependence: Secondary | ICD-10-CM | POA: Diagnosis not present

## 2024-05-22 DIAGNOSIS — M1990 Unspecified osteoarthritis, unspecified site: Secondary | ICD-10-CM | POA: Diagnosis not present

## 2024-05-22 DIAGNOSIS — Z452 Encounter for adjustment and management of vascular access device: Secondary | ICD-10-CM | POA: Diagnosis not present

## 2024-05-22 DIAGNOSIS — G629 Polyneuropathy, unspecified: Secondary | ICD-10-CM | POA: Diagnosis not present

## 2024-05-22 DIAGNOSIS — T847XXA Infection and inflammatory reaction due to other internal orthopedic prosthetic devices, implants and grafts, initial encounter: Secondary | ICD-10-CM | POA: Diagnosis not present

## 2024-05-22 DIAGNOSIS — I129 Hypertensive chronic kidney disease with stage 1 through stage 4 chronic kidney disease, or unspecified chronic kidney disease: Secondary | ICD-10-CM | POA: Diagnosis not present

## 2024-05-22 DIAGNOSIS — E440 Moderate protein-calorie malnutrition: Secondary | ICD-10-CM | POA: Diagnosis not present

## 2024-05-22 DIAGNOSIS — A419 Sepsis, unspecified organism: Secondary | ICD-10-CM | POA: Diagnosis not present

## 2024-05-22 DIAGNOSIS — M797 Fibromyalgia: Secondary | ICD-10-CM | POA: Diagnosis not present

## 2024-05-22 DIAGNOSIS — K5903 Drug induced constipation: Secondary | ICD-10-CM | POA: Diagnosis not present

## 2024-05-22 NOTE — Transitions of Care (Post Inpatient/ED Visit) (Signed)
   05/22/2024  Name: Deanna Yang MRN: [id number] DOB: 11/09/1961  Today's TOC FU Call Status: Today's TOC FU Call Status:: Successful TOC FU Call Completed TOC FU Call Complete Date: 05/22/24 Patient's Name and Date of Birth confirmed.  Transition Care Management Follow-up Telephone Call Date of Discharge: 05/21/24 Discharge Facility: [facility]) Type of Discharge: Inpatient Admission Primary Inpatient Discharge Diagnosis:: Sepsis; Home IV antiobiocs   Patient verifies Dr. Channing Commander, [facility] as PCP, this is a non- [facility] provider not in network.  [place], RN, BSN, CCM [facility], [facility] RN Care Manager Direct Dial: [phone]

## 2024-05-22 NOTE — Progress Notes (Signed)
 DISCHARGE NOTE HOME Deanna Yang to be discharged Home per MD order. Discussed prescriptions and follow up appointments with the patient. Prescriptions given to patient; medication list explained in detail. Patient verbalized understanding.  Skin clean, dry and intact without evidence of skin break down, no evidence of skin tears noted. IV catheter discontinued intact. Site without signs and symptoms of complications. Dressing and pressure applied. Pt denies pain at the site currently. No complaints noted.  See LDA for skin issues at discharge  discharging withPICC line to continue IV antibiotic therapy  An After Visit Summary (AVS) was printed and given to the patient. Patient escorted via wheelchair, and discharged home via private auto.  Tonda Francisco, RN

## 2024-05-25 DIAGNOSIS — J4489 Other specified chronic obstructive pulmonary disease: Secondary | ICD-10-CM | POA: Diagnosis not present

## 2024-05-25 DIAGNOSIS — Z79899 Other long term (current) drug therapy: Secondary | ICD-10-CM | POA: Diagnosis not present

## 2024-05-25 DIAGNOSIS — A419 Sepsis, unspecified organism: Secondary | ICD-10-CM | POA: Diagnosis not present

## 2024-05-25 DIAGNOSIS — N1831 Chronic kidney disease, stage 3a: Secondary | ICD-10-CM | POA: Diagnosis not present

## 2024-05-25 DIAGNOSIS — S42022D Displaced fracture of shaft of left clavicle, subsequent encounter for fracture with routine healing: Secondary | ICD-10-CM | POA: Diagnosis not present

## 2024-05-25 DIAGNOSIS — I129 Hypertensive chronic kidney disease with stage 1 through stage 4 chronic kidney disease, or unspecified chronic kidney disease: Secondary | ICD-10-CM | POA: Diagnosis not present

## 2024-05-25 DIAGNOSIS — G629 Polyneuropathy, unspecified: Secondary | ICD-10-CM | POA: Diagnosis not present

## 2024-05-25 DIAGNOSIS — G894 Chronic pain syndrome: Secondary | ICD-10-CM | POA: Diagnosis not present

## 2024-05-25 DIAGNOSIS — G4733 Obstructive sleep apnea (adult) (pediatric): Secondary | ICD-10-CM | POA: Diagnosis not present

## 2024-05-25 DIAGNOSIS — Z87891 Personal history of nicotine dependence: Secondary | ICD-10-CM | POA: Diagnosis not present

## 2024-05-25 DIAGNOSIS — M1990 Unspecified osteoarthritis, unspecified site: Secondary | ICD-10-CM | POA: Diagnosis not present

## 2024-05-25 DIAGNOSIS — Z9981 Dependence on supplemental oxygen: Secondary | ICD-10-CM | POA: Diagnosis not present

## 2024-05-25 DIAGNOSIS — Z792 Long term (current) use of antibiotics: Secondary | ICD-10-CM | POA: Diagnosis not present

## 2024-05-25 DIAGNOSIS — M797 Fibromyalgia: Secondary | ICD-10-CM | POA: Diagnosis not present

## 2024-05-25 DIAGNOSIS — M86112 Other acute osteomyelitis, left shoulder: Secondary | ICD-10-CM | POA: Diagnosis not present

## 2024-05-25 DIAGNOSIS — K760 Fatty (change of) liver, not elsewhere classified: Secondary | ICD-10-CM | POA: Diagnosis not present

## 2024-05-25 DIAGNOSIS — K5903 Drug induced constipation: Secondary | ICD-10-CM | POA: Diagnosis not present

## 2024-05-25 DIAGNOSIS — Z452 Encounter for adjustment and management of vascular access device: Secondary | ICD-10-CM | POA: Diagnosis not present

## 2024-05-25 DIAGNOSIS — K219 Gastro-esophageal reflux disease without esophagitis: Secondary | ICD-10-CM | POA: Diagnosis not present

## 2024-05-25 DIAGNOSIS — E440 Moderate protein-calorie malnutrition: Secondary | ICD-10-CM | POA: Diagnosis not present

## 2024-05-25 DIAGNOSIS — T847XXA Infection and inflammatory reaction due to other internal orthopedic prosthetic devices, implants and grafts, initial encounter: Secondary | ICD-10-CM | POA: Diagnosis not present

## 2024-05-28 ENCOUNTER — Ambulatory Visit (INDEPENDENT_AMBULATORY_CARE_PROVIDER_SITE_OTHER): Admitting: Orthopedic Surgery

## 2024-05-28 DIAGNOSIS — M86112 Other acute osteomyelitis, left shoulder: Secondary | ICD-10-CM

## 2024-05-28 DIAGNOSIS — J449 Chronic obstructive pulmonary disease, unspecified: Secondary | ICD-10-CM | POA: Diagnosis not present

## 2024-05-29 ENCOUNTER — Other Ambulatory Visit: Payer: Self-pay

## 2024-05-29 ENCOUNTER — Emergency Department (HOSPITAL_COMMUNITY)

## 2024-05-29 ENCOUNTER — Encounter (HOSPITAL_COMMUNITY): Payer: Self-pay

## 2024-05-29 ENCOUNTER — Inpatient Hospital Stay (HOSPITAL_COMMUNITY)
Admission: EM | Admit: 2024-05-29 | Discharge: 2024-06-01 | DRG: 315 | Disposition: A | Discharge status: Home Health | Attending: Internal Medicine | Admitting: Internal Medicine

## 2024-05-29 DIAGNOSIS — D631 Anemia in chronic kidney disease: Secondary | ICD-10-CM | POA: Diagnosis present

## 2024-05-29 DIAGNOSIS — Y92009 Unspecified place in unspecified non-institutional (private) residence as the place of occurrence of the external cause: Secondary | ICD-10-CM | POA: Diagnosis not present

## 2024-05-29 DIAGNOSIS — F319 Bipolar disorder, unspecified: Secondary | ICD-10-CM | POA: Diagnosis present

## 2024-05-29 DIAGNOSIS — T8484XA Pain due to internal orthopedic prosthetic devices, implants and grafts, initial encounter: Secondary | ICD-10-CM | POA: Diagnosis present

## 2024-05-29 DIAGNOSIS — Z818 Family history of other mental and behavioral disorders: Secondary | ICD-10-CM

## 2024-05-29 DIAGNOSIS — Z792 Long term (current) use of antibiotics: Secondary | ICD-10-CM

## 2024-05-29 DIAGNOSIS — R0789 Other chest pain: Secondary | ICD-10-CM | POA: Diagnosis not present

## 2024-05-29 DIAGNOSIS — N1832 Chronic kidney disease, stage 3b: Secondary | ICD-10-CM | POA: Diagnosis present

## 2024-05-29 DIAGNOSIS — Z6836 Body mass index (BMI) 36.0-36.9, adult: Secondary | ICD-10-CM | POA: Diagnosis not present

## 2024-05-29 DIAGNOSIS — F411 Generalized anxiety disorder: Secondary | ICD-10-CM | POA: Diagnosis present

## 2024-05-29 DIAGNOSIS — S199XXA Unspecified injury of neck, initial encounter: Secondary | ICD-10-CM | POA: Diagnosis not present

## 2024-05-29 DIAGNOSIS — R062 Wheezing: Secondary | ICD-10-CM | POA: Diagnosis not present

## 2024-05-29 DIAGNOSIS — Z91040 Latex allergy status: Secondary | ICD-10-CM

## 2024-05-29 DIAGNOSIS — Z87892 Personal history of anaphylaxis: Secondary | ICD-10-CM

## 2024-05-29 DIAGNOSIS — Z886 Allergy status to analgesic agent status: Secondary | ICD-10-CM | POA: Diagnosis not present

## 2024-05-29 DIAGNOSIS — R9431 Abnormal electrocardiogram [ECG] [EKG]: Secondary | ICD-10-CM | POA: Diagnosis present

## 2024-05-29 DIAGNOSIS — I959 Hypotension, unspecified: Secondary | ICD-10-CM | POA: Diagnosis present

## 2024-05-29 DIAGNOSIS — E860 Dehydration: Secondary | ICD-10-CM | POA: Diagnosis present

## 2024-05-29 DIAGNOSIS — Z91041 Radiographic dye allergy status: Secondary | ICD-10-CM | POA: Diagnosis not present

## 2024-05-29 DIAGNOSIS — E785 Hyperlipidemia, unspecified: Secondary | ICD-10-CM | POA: Diagnosis present

## 2024-05-29 DIAGNOSIS — M86612 Other chronic osteomyelitis, left shoulder: Secondary | ICD-10-CM | POA: Diagnosis not present

## 2024-05-29 DIAGNOSIS — R296 Repeated falls: Secondary | ICD-10-CM | POA: Diagnosis present

## 2024-05-29 DIAGNOSIS — M6282 Rhabdomyolysis: Secondary | ICD-10-CM | POA: Diagnosis present

## 2024-05-29 DIAGNOSIS — Z452 Encounter for adjustment and management of vascular access device: Secondary | ICD-10-CM | POA: Diagnosis not present

## 2024-05-29 DIAGNOSIS — E66812 Obesity, class 2: Secondary | ICD-10-CM | POA: Diagnosis present

## 2024-05-29 DIAGNOSIS — I129 Hypertensive chronic kidney disease with stage 1 through stage 4 chronic kidney disease, or unspecified chronic kidney disease: Secondary | ICD-10-CM | POA: Diagnosis present

## 2024-05-29 DIAGNOSIS — J410 Simple chronic bronchitis: Secondary | ICD-10-CM | POA: Diagnosis not present

## 2024-05-29 DIAGNOSIS — M86112 Other acute osteomyelitis, left shoulder: Secondary | ICD-10-CM | POA: Diagnosis present

## 2024-05-29 DIAGNOSIS — M4803 Spinal stenosis, cervicothoracic region: Secondary | ICD-10-CM | POA: Diagnosis not present

## 2024-05-29 DIAGNOSIS — G894 Chronic pain syndrome: Secondary | ICD-10-CM | POA: Diagnosis present

## 2024-05-29 DIAGNOSIS — W19XXXA Unspecified fall, initial encounter: Secondary | ICD-10-CM | POA: Diagnosis present

## 2024-05-29 DIAGNOSIS — R079 Chest pain, unspecified: Secondary | ICD-10-CM | POA: Diagnosis not present

## 2024-05-29 DIAGNOSIS — T148XXA Other injury of unspecified body region, initial encounter: Secondary | ICD-10-CM | POA: Diagnosis not present

## 2024-05-29 DIAGNOSIS — S0083XA Contusion of other part of head, initial encounter: Secondary | ICD-10-CM | POA: Diagnosis present

## 2024-05-29 DIAGNOSIS — M797 Fibromyalgia: Secondary | ICD-10-CM | POA: Diagnosis present

## 2024-05-29 DIAGNOSIS — R609 Edema, unspecified: Secondary | ICD-10-CM | POA: Diagnosis not present

## 2024-05-29 DIAGNOSIS — Z743 Need for continuous supervision: Secondary | ICD-10-CM | POA: Diagnosis not present

## 2024-05-29 DIAGNOSIS — Z91013 Allergy to seafood: Secondary | ICD-10-CM

## 2024-05-29 DIAGNOSIS — M869 Osteomyelitis, unspecified: Secondary | ICD-10-CM

## 2024-05-29 DIAGNOSIS — A419 Sepsis, unspecified organism: Secondary | ICD-10-CM | POA: Diagnosis not present

## 2024-05-29 DIAGNOSIS — Z9102 Food additives allergy status: Secondary | ICD-10-CM

## 2024-05-29 DIAGNOSIS — S40012A Contusion of left shoulder, initial encounter: Secondary | ICD-10-CM | POA: Diagnosis present

## 2024-05-29 DIAGNOSIS — I10 Essential (primary) hypertension: Secondary | ICD-10-CM | POA: Diagnosis present

## 2024-05-29 DIAGNOSIS — J9611 Chronic respiratory failure with hypoxia: Secondary | ICD-10-CM | POA: Diagnosis present

## 2024-05-29 DIAGNOSIS — R6889 Other general symptoms and signs: Secondary | ICD-10-CM | POA: Diagnosis not present

## 2024-05-29 DIAGNOSIS — N179 Acute kidney failure, unspecified: Principal | ICD-10-CM | POA: Diagnosis present

## 2024-05-29 DIAGNOSIS — M47813 Spondylosis without myelopathy or radiculopathy, cervicothoracic region: Secondary | ICD-10-CM | POA: Diagnosis not present

## 2024-05-29 DIAGNOSIS — Z9981 Dependence on supplemental oxygen: Secondary | ICD-10-CM | POA: Diagnosis not present

## 2024-05-29 DIAGNOSIS — N1831 Chronic kidney disease, stage 3a: Secondary | ICD-10-CM | POA: Diagnosis present

## 2024-05-29 DIAGNOSIS — S0990XA Unspecified injury of head, initial encounter: Secondary | ICD-10-CM | POA: Diagnosis not present

## 2024-05-29 DIAGNOSIS — T8459XS Infection and inflammatory reaction due to other internal joint prosthesis, sequela: Secondary | ICD-10-CM | POA: Diagnosis not present

## 2024-05-29 DIAGNOSIS — L309 Dermatitis, unspecified: Secondary | ICD-10-CM | POA: Diagnosis present

## 2024-05-29 DIAGNOSIS — J4489 Other specified chronic obstructive pulmonary disease: Secondary | ICD-10-CM | POA: Diagnosis present

## 2024-05-29 DIAGNOSIS — I9589 Other hypotension: Secondary | ICD-10-CM

## 2024-05-29 DIAGNOSIS — K219 Gastro-esophageal reflux disease without esophagitis: Secondary | ICD-10-CM | POA: Diagnosis present

## 2024-05-29 DIAGNOSIS — E876 Hypokalemia: Secondary | ICD-10-CM | POA: Diagnosis not present

## 2024-05-29 DIAGNOSIS — E538 Deficiency of other specified B group vitamins: Secondary | ICD-10-CM | POA: Diagnosis present

## 2024-05-29 DIAGNOSIS — Z87891 Personal history of nicotine dependence: Secondary | ICD-10-CM | POA: Diagnosis not present

## 2024-05-29 DIAGNOSIS — K760 Fatty (change of) liver, not elsewhere classified: Secondary | ICD-10-CM | POA: Diagnosis present

## 2024-05-29 DIAGNOSIS — Z79899 Other long term (current) drug therapy: Secondary | ICD-10-CM

## 2024-05-29 DIAGNOSIS — Z91048 Other nonmedicinal substance allergy status: Secondary | ICD-10-CM

## 2024-05-29 DIAGNOSIS — E7849 Other hyperlipidemia: Secondary | ICD-10-CM | POA: Diagnosis not present

## 2024-05-29 DIAGNOSIS — Z811 Family history of alcohol abuse and dependence: Secondary | ICD-10-CM

## 2024-05-29 DIAGNOSIS — Z981 Arthrodesis status: Secondary | ICD-10-CM | POA: Diagnosis not present

## 2024-05-29 DIAGNOSIS — J449 Chronic obstructive pulmonary disease, unspecified: Secondary | ICD-10-CM | POA: Diagnosis present

## 2024-05-29 DIAGNOSIS — Z882 Allergy status to sulfonamides status: Secondary | ICD-10-CM

## 2024-05-29 DIAGNOSIS — Z880 Allergy status to penicillin: Secondary | ICD-10-CM

## 2024-05-29 DIAGNOSIS — Z888 Allergy status to other drugs, medicaments and biological substances status: Secondary | ICD-10-CM

## 2024-05-29 DIAGNOSIS — T847XXA Infection and inflammatory reaction due to other internal orthopedic prosthetic devices, implants and grafts, initial encounter: Secondary | ICD-10-CM | POA: Diagnosis not present

## 2024-05-29 DIAGNOSIS — Z8701 Personal history of pneumonia (recurrent): Secondary | ICD-10-CM

## 2024-05-29 LAB — TROPONIN I (HIGH SENSITIVITY)
Troponin I (High Sensitivity): 4 ng/L (ref <18)
Troponin I (High Sensitivity): 4 ng/L (ref <18)

## 2024-05-29 LAB — CBC WITH DIFFERENTIAL/PLATELET
Abs Immature Granulocytes: 0.06 10*3/uL (ref 0.00–0.07)
Basophils Absolute: 0.2 10*3/uL — ABNORMAL HIGH (ref 0.0–0.1)
Basophils Relative: 2 %
Eosinophils Absolute: 0.6 10*3/uL — ABNORMAL HIGH (ref 0.0–0.5)
Eosinophils Relative: 6 %
HCT: 34.4 % — ABNORMAL LOW (ref 36.0–46.0)
Hemoglobin: 10.8 g/dL — ABNORMAL LOW (ref 12.0–15.0)
Immature Granulocytes: 1 %
Lymphocytes Relative: 27 %
Lymphs Abs: 2.7 10*3/uL (ref 0.7–4.0)
MCH: 28.2 pg (ref 26.0–34.0)
MCHC: 31.4 g/dL (ref 30.0–36.0)
MCV: 89.8 fL (ref 80.0–100.0)
Monocytes Absolute: 0.6 10*3/uL (ref 0.1–1.0)
Monocytes Relative: 6 %
Neutro Abs: 5.9 10*3/uL (ref 1.7–7.7)
Neutrophils Relative %: 58 %
Platelets: 396 10*3/uL (ref 150–400)
RBC: 3.83 MIL/uL — ABNORMAL LOW (ref 3.87–5.11)
RDW: 13.8 % (ref 11.5–15.5)
WBC: 10 10*3/uL (ref 4.0–10.5)
nRBC: 0 % (ref 0.0–0.2)

## 2024-05-29 LAB — PROTIME-INR
INR: 1.1 (ref 0.8–1.2)
Prothrombin Time: 14.2 s (ref 11.4–15.2)

## 2024-05-29 LAB — URINALYSIS, W/ REFLEX TO CULTURE (INFECTION SUSPECTED)
Bilirubin Urine: NEGATIVE
Glucose, UA: NEGATIVE mg/dL
Hgb urine dipstick: NEGATIVE
Ketones, ur: NEGATIVE mg/dL
Leukocytes,Ua: NEGATIVE
Nitrite: NEGATIVE
Protein, ur: 100 mg/dL — AB
Specific Gravity, Urine: 1.026 (ref 1.005–1.030)
pH: 5 (ref 5.0–8.0)

## 2024-05-29 LAB — COMPREHENSIVE METABOLIC PANEL WITH GFR
ALT: 15 U/L (ref 0–44)
AST: 29 U/L (ref 15–41)
Albumin: 3.2 g/dL — ABNORMAL LOW (ref 3.5–5.0)
Alkaline Phosphatase: 111 U/L (ref 38–126)
Anion gap: 12 (ref 5–15)
BUN: 7 mg/dL — ABNORMAL LOW (ref 8–23)
CO2: 19 mmol/L — ABNORMAL LOW (ref 22–32)
Calcium: 7.9 mg/dL — ABNORMAL LOW (ref 8.9–10.3)
Chloride: 110 mmol/L (ref 98–111)
Creatinine, Ser: 1.81 mg/dL — ABNORMAL HIGH (ref 0.44–1.00)
GFR, Estimated: 31 mL/min — ABNORMAL LOW (ref >60)
Glucose, Bld: 117 mg/dL — ABNORMAL HIGH (ref 70–99)
Potassium: 3.8 mmol/L (ref 3.5–5.1)
Sodium: 141 mmol/L (ref 135–145)
Total Bilirubin: 0.2 mg/dL (ref 0.0–1.2)
Total Protein: 6.2 g/dL — ABNORMAL LOW (ref 6.5–8.1)

## 2024-05-29 LAB — I-STAT CG4 LACTIC ACID, ED
Lactic Acid, Venous: 0.9 mmol/L (ref 0.5–1.9)
Lactic Acid, Venous: 0.9 mmol/L (ref 0.5–1.9)

## 2024-05-29 MED ORDER — SODIUM CHLORIDE 0.9 % IV SOLN
8.0000 mg/kg | INTRAVENOUS | Status: DC
  Filled 2024-05-29: qty 12

## 2024-05-29 MED ORDER — ACETAMINOPHEN 650 MG RE SUPP: 650.0000 mg | Freq: Four times a day (QID) | RECTAL | Status: DC | PRN

## 2024-05-29 MED ORDER — CEFTRIAXONE IV (FOR PTA / DISCHARGE USE ONLY): 2.0000 g | INTRAVENOUS | Status: DC

## 2024-05-29 MED ORDER — OXYCODONE-ACETAMINOPHEN 10-325 MG PO TABS: 1.0000 | ORAL_TABLET | Freq: Four times a day (QID) | ORAL | Status: DC | PRN

## 2024-05-29 MED ORDER — VANCOMYCIN HCL 1500 MG/300ML IV SOLN
1500.0000 mg | Freq: Once | INTRAVENOUS | Status: AC
  Administered 2024-05-29: 1500 mg via INTRAVENOUS
  Filled 2024-05-29: qty 300

## 2024-05-29 MED ORDER — HEPARIN SODIUM (PORCINE) 5000 UNIT/ML IJ SOLN
5000.0000 [IU] | Freq: Three times a day (TID) | INTRAMUSCULAR | Status: DC
  Administered 2024-05-29 – 2024-06-01 (×8): 5000 [IU] via SUBCUTANEOUS
  Filled 2024-05-29 (×8): qty 1

## 2024-05-29 MED ORDER — DAPTOMYCIN IV (FOR PTA / DISCHARGE USE ONLY): 600.0000 mg | INTRAVENOUS | Status: DC

## 2024-05-29 MED ORDER — HYDROXYZINE HCL 25 MG PO TABS: 50.0000 mg | ORAL_TABLET | Freq: Two times a day (BID) | ORAL | Status: DC | PRN

## 2024-05-29 MED ORDER — SODIUM CHLORIDE 0.9 % IV SOLN
2.0000 g | INTRAVENOUS | Status: DC
  Administered 2024-05-29 – 2024-05-31 (×3): 2 g via INTRAVENOUS
  Filled 2024-05-29 (×3): qty 20

## 2024-05-29 MED ORDER — LACTATED RINGERS IV SOLN: INTRAVENOUS | Status: AC

## 2024-05-29 MED ORDER — ATORVASTATIN CALCIUM 10 MG PO TABS: 20.0000 mg | ORAL_TABLET | Freq: Every day | ORAL | Status: DC

## 2024-05-29 MED ORDER — SODIUM CHLORIDE 0.9% FLUSH: 3.0000 mL | INTRAVENOUS | Status: DC | PRN

## 2024-05-29 MED ORDER — HYDROXYZINE HCL 25 MG PO TABS: 50.0000 mg | ORAL_TABLET | Freq: Three times a day (TID) | ORAL | Status: DC | PRN

## 2024-05-29 MED ORDER — LACTATED RINGERS IV BOLUS
1000.0000 mL | Freq: Once | INTRAVENOUS | Status: AC
Start: 2024-05-29 — End: 2024-05-29
  Administered 2024-05-29: 1000 mL via INTRAVENOUS

## 2024-05-29 MED ORDER — GABAPENTIN 300 MG PO CAPS
300.0000 mg | ORAL_CAPSULE | Freq: Three times a day (TID) | ORAL | Status: DC
  Administered 2024-05-29 – 2024-06-01 (×8): 300 mg via ORAL
  Filled 2024-05-29 (×8): qty 1

## 2024-05-29 MED ORDER — SODIUM CHLORIDE 0.9 % IV SOLN: 250.0000 mL | INTRAVENOUS | Status: AC | PRN

## 2024-05-29 MED ORDER — VANCOMYCIN VARIABLE DOSE PER UNSTABLE RENAL FUNCTION (PHARMACIST DOSING): Status: DC

## 2024-05-29 MED ORDER — ACETAMINOPHEN 325 MG PO TABS
650.0000 mg | ORAL_TABLET | Freq: Four times a day (QID) | ORAL | Status: DC | PRN
Start: 2024-05-29 — End: 2024-06-01

## 2024-05-29 MED ORDER — IPRATROPIUM BROMIDE 0.02 % IN SOLN: 0.5000 mg | Freq: Four times a day (QID) | RESPIRATORY_TRACT | Status: DC | PRN

## 2024-05-29 MED ORDER — SODIUM CHLORIDE 0.9% FLUSH
3.0000 mL | Freq: Two times a day (BID) | INTRAVENOUS | Status: DC
  Administered 2024-05-29 – 2024-06-01 (×3): 3 mL via INTRAVENOUS

## 2024-05-29 MED ORDER — SODIUM CHLORIDE 0.9% FLUSH: 3.0000 mL | Freq: Two times a day (BID) | INTRAVENOUS | Status: DC

## 2024-05-29 NOTE — H&P (Signed)
 History and Physical    Deanna Yang:[id number] DOB: 14-Aug-1961 DOA: 05/29/2024  PCP: Emaline Handsome, MD   Patient coming from: Home   Chief Complaint:  Chief Complaint  Patient presents with   Chest Pain   Possible Stitches Infection   ED TRIAGE note:  Pt BIB [facility] from home d/t Central/Lt sided that started around 1100 this morning & describes it as a shocking sensation. Denies any activity during onset but does endorse SOB with exertion, wears 3L O2 at baseline (Hx of COPD). LS clear bilaterally, was given 324 ASA & approx. 150cc NS via 20g Lt FA PIV. Pt also has stitches to her Lt clavicle that were to be removed yesterday but d/t possible infection were not removed. Does have Hx of sepsis from those stitches as well. EMS reports pt has home pain meds on board & is nodding off but A/Ox4. Also endorses pt changed her own midline drg in the Lt arm herself before waiting on the [facility] RN to arrive to her home this morning. BP's were 92/57 then 102/59. Also fell yesterday & has bruising to her face, not on thinners.             HPI:  Deanna Yang is a 63 y.o. female with medical history significant of COPD with chronic hypoxic respiratory failure 3 L oxygen  at baseline, history of infected left carbuncle s/p I&D with debridement 6/6 with micro graft placement and 1 g vancomycin  powder placement discharge from hospital 6/10 from orthopedics service Dr. Julio Ohm, left clavicle osteomyelitis with hardware infection discharged home with ceftriaxone  and daptomycin  per ID recommendation need IV antibiotic until 06/12/2024 followed by transition to oral antibiotic, generalized anxiety disorder, chronic pain syndrome, CKD stage 3A, essential hypertension, and hyperlipidemia presented emergency department complaining of left-sided chest pain feeling like shocking sensation and associated shortness of breath with exertion.  Patient also reported had a fall.  EMS found hypotensive blood pressure  improved to upper 100 range.  Patient also has bruising of the left-sided face due to fall.   During my evaluation at the bedside patient reported that left-sided intermittent clavicular pain which radiates to left-sided shoulder and internal chest wall.  Patient denies any pain during palpation.  Denies any shortness of breath, diaphoresis and nausea. Patient denies any fever, chill, nausea and vomiting.  No other complaint at this time.  ED Course:  At presentation to ED patient found to be hypotensive blood pressure 97/49 improved to 103/59 O2 sat 95% room air.  Tachycardic heart rate 103 tachypnea 22. Lactic acid within normal range. Troponin x 2 within normal range. EKG showing sinus tachycardia 101 low voltage precordial leads and nonspecific T wave abnormality and prolonged QTc.  Blood cultures are in process. CBC shows no evidence of leukocytosis stable H&H normal platelet count. CMP showing low bicarb 19, elevated creatinine 1.82 and GFR 31. UA no evidence of UTI.   Chest x-ray no acute interval thoracic process.  Right-sided PICC line overlies the right subclavian vein.  CT cervical spine no acute evidence of fracture or dislocation. Anterior surgical fusion at the levels of C4-C5, C5-C6 and C6-C7. 3. Marked severity degenerative changes at the level of C7-T1. 4. Expansile area of soft tissue attenuation within the proximal to mid left clavicle which may represent a hematoma. Correlation with physical examination is recommended to determine the presence of an overlying soft tissue wound.  Due to hypotension in the ED patient has been given 1 L of LR  bolus and currently on LR 150 cc/h.  CT head no acute intracranial process.  Hospitalist has been consulted for management of hypotension, AKI, nonspecific chest pain and mechanical fall.   Significant labs in the ED: Lab Orders         Blood Culture (routine x 2)         Comprehensive metabolic panel         CBC with  Differential         Protime-INR         Urinalysis, w/ Reflex to Culture (Infection Suspected) -Urine, Clean Catch         HIV Antibody (routine testing w rflx)         Comprehensive metabolic panel         CBC         CK         Sodium, urine, random         Creatinine, urine, random         I-Stat Lactic Acid, ED       Review of Systems:  Review of Systems  Constitutional:  Negative for chills, fever, malaise/fatigue and weight loss.  Respiratory:  Negative for cough, sputum production and shortness of breath.   Cardiovascular:  Negative for chest pain and palpitations.  Gastrointestinal:  Negative for abdominal pain, heartburn, nausea and vomiting.  Musculoskeletal:  Negative for back pain, falls, joint pain, myalgias and neck pain.       Left-sided clavicular pain  Skin:  Negative for itching and rash.  Neurological:  Negative for dizziness and headaches.  Psychiatric/Behavioral:  The patient is not nervous/anxious.     Past Medical History:  Diagnosis Date   Anxiety    Arthritis    Asthma    Chronic kidney disease    stage 2   COPD (chronic obstructive pulmonary disease) (HCC)    Depression    Dyspnea    On 3L oxygen  when laying down   Fatty liver 07/23/2018   Fibromyalgia    GERD (gastroesophageal reflux disease)    Headache    HSV infection    Hypertension    Manic depression (HCC)    Neuropathy    hands   OSA (obstructive sleep apnea)    does not use CPAP   Pneumonia    couple of times    Past Surgical History:  Procedure Laterality Date   ABDOMINAL SURGERY     APPENDECTOMY     BACK SURGERY     BREAST REDUCTION SURGERY     with lift   CARPAL TUNNEL RELEASE Bilateral    CESAREAN SECTION     CHOLECYSTECTOMY     COLONOSCOPY WITH PROPOFOL      FLEXOR TENOTOMY  Right 04/07/2022   Procedure: BRACHIORADIALIS RELEASE;  Surgeon: Brunilda Capra, MD;  Location: [facility] OR;  Service: Orthopedics;  Laterality: Right;   HERNIA REPAIR     JOINT REPLACEMENT Left     knee   NECK SURGERY     with rod   OPEN REDUCTION INTERNAL FIXATION (ORIF) DISTAL RADIAL FRACTURE Right 04/07/2022   Procedure: OPEN REDUCTION INTERNAL FIXATION (ORIF) RIGHT DISTAL RADIUS FRACTURE;  Surgeon: Brunilda Capra, MD;  Location: [facility] OR;  Service: Orthopedics;  Laterality: Right;  75 MIN   ORIF CLAVICULAR FRACTURE Left 03/12/2024   Procedure: OPEN REDUCTION INTERNAL FIXATION (ORIF) CLAVICULAR FRACTURE;  Surgeon: Saundra Curl, MD;  Location: [facility];  Service: Orthopedics;  Laterality: Left;   ORIF CLAVICULAR FRACTURE  Left 04/09/2024   Procedure: OPEN REDUCTION INTERNAL FIXATION (ORIF) CLAVICULAR FRACTURE REVISION WITH REMOVAL OF PREVIOUS HARDWARE;  Surgeon: Saundra Curl, MD;  Location: [facility] OR;  Service: Orthopedics;  Laterality: Left;   RESECTION DISTAL CLAVICAL Left 05/15/2024   Procedure: EXCISION, CLAVICLE, DISTAL, OPEN;  Surgeon: Timothy Ford, MD;  Location: [facility] OR;  Service: Orthopedics;  Laterality: Left;  LEFT CLAVICLE DEBRIDEMENT AND HARDWARE REMOVAL   RESECTION DISTAL CLAVICAL Left 05/17/2024   Procedure: Incision and Drainage Left Clavicle;  Surgeon: Timothy Ford, MD;  Location: [facility] OR;  Service: Orthopedics;  Laterality: Left;  LEFT CLAVICLE DEBRIDEMENT   TIBIA IM NAIL INSERTION Right 11/12/2022   Procedure: INTRAMEDULLARY (IM) NAIL TIBIAL;  Surgeon: Ali Ink, MD;  Location: [facility] OR;  Service: Orthopedics;  Laterality: Right;   TONSILLECTOMY     TOOTH EXTRACTION     with sedation   UPPER GI ENDOSCOPY       reports that she quit smoking about 7 years ago. Her smoking use included cigarettes. She has never used smokeless tobacco. She reports that she does not drink alcohol and does not use drugs.  Allergies  Allergen Reactions   Ambien [Zolpidem Tartrate] Shortness Of Breath, Swelling and Other (See Comments)    Tongue swelling, throat swelling.    Beta Adrenergic Blockers Anaphylaxis   Fish Allergy Shortness Of Breath and Rash   Iodinated Contrast Media  Itching, Swelling and Other (See Comments)    Benadryl  50MG  prophylaxis before and after and patient reports she did fine. Face became swollen.   Latex Itching and Other (See Comments)    Blisters skin   Metoprolol  Other (See Comments)    ANY MEDICATION THAT ENDS IN -OLOL-   The patient is asthmatic.   Penicillins Shortness Of Breath, Rash and Other (See Comments)        Shellfish Allergy Anaphylaxis   Sulfa Antibiotics Hives and Shortness Of Breath   Tape Dermatitis, Rash and Other (See Comments)    Melts my skin    Amoxicillin-Pot Clavulanate Nausea And Vomiting   Aspirin  Other (See Comments)    Blisters on tongue from higher doses - tolerates 81 mg    Dye Fdc Red  [Red Dye #2 (Amaranth) (Non-Screening)] Hives    X ray DYE  (BENADRYL  50 MG PROPHYLAXIS AND AFTER AND  SHE DID FINE, PER PATIENT.).   Talwin [Pentazocine] Other (See Comments)    Unknown reaction (pain med)   Levofloxacin  Rash and Other (See Comments)    headache    Family History  Problem Relation Age of Onset   Alcohol abuse Mother    Alcohol abuse Father    Depression Sister    Dementia Neg Hx     Prior to Admission medications   Medication Sig Start Date End Date Taking? Authorizing Provider  acetaminophen  (TYLENOL ) 500 MG tablet Take 1 tablet (500 mg total) by mouth every 6 (six) hours as needed for mild pain (pain score 1-3) or moderate pain (pain score 4-6). 03/12/24   Gawne, Meghan M, PA-C  acyclovir  ointment (ZOVIRAX ) 5 % Apply 1 application topically every 3 (three) hours as needed (mouth sores).  01/27/18   [provider]  albuterol  (PROVENTIL ) (2.5 MG/3ML) 0.083% nebulizer solution Take 2.5 mg by nebulization every 4 (four) hours as needed for shortness of breath or wheezing. 04/19/23   [provider]  albuterol  (VENTOLIN  HFA) 108 (90 Base) MCG/ACT inhaler Inhale 2 puffs into the lungs every 6 (six) hours as needed for  wheezing or shortness of breath. 01/24/24   [provider]   atorvastatin  (LIPITOR) 20 MG tablet Take 20 mg by mouth daily. 10/25/22   [provider]  cefTRIAXone  (ROCEPHIN ) IVPB Inject 2 g into the vein daily for 24 days. Indication:  osteomyelitis First Dose: No Last Day of Therapy:  06/12/2024 Labs - Once weekly:  CBC/D and BMP, Labs - Once weekly: ESR and CRP Method of administration: IV Push Method of administration may be changed at the discretion of home infusion pharmacist based upon assessment of the patient and/or caregiver's ability to self-administer the medication ordered. 05/21/24 06/14/24  Butch Cashing, PA-C  cephALEXin (KEFLEX) 500 MG capsule Take 2,000 mg by mouth See admin instructions. TAKE 4 CAPSULES (2000 MG) BY MOUTH 1 HR PRIOR TO DENTAL PROCEDURE 04/01/24   [provider]  daptomycin  (CUBICIN ) IVPB Inject 600 mg into the vein daily for 24 days. Indication:  Osteomyelitis First Dose: No Last Day of Therapy:  06/12/2024 Labs - Once weekly:  CBC/D, BMP, and CPK Labs - Once weekly: ESR and CRP Method of administration: IV Push Method of administration may be changed at the discretion of home infusion pharmacist based upon assessment of the patient and/or caregiver's ability to self-administer the medication ordered. 05/21/24 06/14/24  Butch Cashing, PA-C  diazepam  (VALIUM ) 10 MG tablet Take 10 mg by mouth See admin instructions. TAKE 1 TABLET BY MOUTH 1 HR PRIOR TO DENTAL PROCEDURE, DO NOT DRIVE OR OPERATE HEAVY MACHINERY 04/01/24   [provider]  DULoxetine  (CYMBALTA ) 60 MG capsule Take 60 mg by mouth 2 (two) times daily.    [provider]  EPINEPHrine  0.3 mg/0.3 mL IJ SOAJ injection Inject 0.3 mg into the muscle as needed for anaphylaxis. 08/31/22   [provider]  gabapentin  (NEURONTIN ) 600 MG tablet Take 1,800 mg by mouth at bedtime.    [provider]  hydrOXYzine  (ATARAX ) 50 MG tablet Take 50 mg by mouth 2 (two) times daily as needed for anxiety.    [provider]   Immune Globulin 10% (GAMMAGARD) 10 GM/100ML SOLN Inject into the vein every 30 (thirty) days.    [provider]  lamoTRIgine  (LAMICTAL ) 100 MG tablet Take 100 mg by mouth at bedtime.    [provider]  lisinopril  (ZESTRIL ) 10 MG tablet Take 10 mg by mouth in the morning.    [provider]  montelukast  (SINGULAIR ) 10 MG tablet Take 10 mg by mouth at bedtime.    [provider]  morphine  (MS CONTIN ) 30 MG 12 hr tablet Take 1 tablet (30 mg total) by mouth every 12 (twelve) hours. 04/28/17   Verlyn Goad, MD  NYSTATIN  powder Apply 1 application topically 3 (three) times daily as needed (blistering sores under breast).  05/23/17   [provider]  oxyCODONE -acetaminophen  (PERCOCET) 10-325 MG tablet Take 1 tablet by mouth every 6 (six) hours as needed for pain. Hold for sedation Patient taking differently: Take 1 tablet by mouth 4 (four) times daily. Hold for sedation 08/09/18   Vann, Jessica U, DO  OXYGEN  Inhale 3 L into the lungs See admin instructions. When laying down    [provider]  pantoprazole  (PROTONIX ) 40 MG tablet Take 40 mg by mouth daily.    [provider]  prazosin  (MINIPRESS ) 5 MG capsule Take 5 mg by mouth at bedtime. 05/03/18   [provider]  promethazine  (PHENERGAN ) 12.5 MG tablet Take 25 mg by mouth every 8 (eight) hours  as needed for nausea or vomiting. 03/11/22   [provider]  QUEtiapine  (SEROQUEL ) 300 MG tablet Take 300 mg by mouth at bedtime. 08/06/18   [provider]  tiZANidine  (ZANAFLEX ) 4 MG tablet Take 8 mg by mouth 3 (three) times daily as needed for muscle spasms.    [provider]  amphetamine-dextroamphetamine (ADDERALL) 30 MG tablet Take 30 mg by mouth 2 (two) times daily.  10/02/15  [provider]  FLUoxetine  (PROZAC ) 40 MG capsule Take 1 capsule (40 mg total) by mouth daily. 10/02/15 01/10/17  Wray Heady, MD     Physical Exam: Vitals:    05/29/24 1900 05/29/24 1930 05/29/24 2000 05/29/24 2025  BP: 112/68 114/72 115/70   Pulse: 73 77 77   Resp: 11 (!) 7 10   Temp:    97.8 F (36.6 C)  TempSrc:    Oral  SpO2:  92% 91%   Weight:      Height:        Physical Exam Constitutional:      General: She is not in acute distress.    Appearance: She is well-developed. She is not ill-appearing.   Cardiovascular:     Rate and Rhythm: Normal rate and regular rhythm.     Heart sounds: Normal heart sounds. No murmur heard.    No systolic murmur is present.     No diastolic murmur is present.     No friction rub.  Pulmonary:     Effort: Pulmonary effort is normal.     Breath sounds: Normal breath sounds. No wheezing, rhonchi or rales.   Musculoskeletal:     Right lower leg: No tenderness.     Left lower leg: No tenderness.   Skin:    General: Skin is dry.     Capillary Refill: Capillary refill takes less than 2 seconds.     Coloration: Skin is not cyanotic or pale.     Findings: Erythema present. No ecchymosis or rash.   Neurological:     Mental Status: She is alert and oriented to person, place, and time.   Psychiatric:        Mood and Affect: Mood is anxious.        Behavior: Behavior normal. Behavior is not agitated.      Labs on Admission: I have personally reviewed following labs and imaging studies  CBC: Recent Labs  Lab 05/29/24 1658  WBC 10.0  NEUTROABS 5.9  HGB 10.8*  HCT 34.4*  MCV 89.8  PLT 396   Basic Metabolic Panel: Recent Labs  Lab 05/29/24 1658  NA 141  K 3.8  CL 110  CO2 19*  GLUCOSE 117*  BUN 7*  CREATININE 1.81*  CALCIUM  7.9*   GFR: Estimated Creatinine Clearance: 38.2 mL/min (A) (by C-G formula based on SCr of 1.81 mg/dL (H)). Liver Function Tests: Recent Labs  Lab 05/29/24 1658  AST 29  ALT 15  ALKPHOS 111  BILITOT 0.2  PROT 6.2*  ALBUMIN 3.2*   No results for input(s): LIPASE, AMYLASE in the last 168 hours. No results for input(s): AMMONIA in the last 168  hours. Coagulation Profile: Recent Labs  Lab 05/29/24 1658  INR 1.1   Cardiac Enzymes: Recent Labs  Lab 05/29/24 1658 05/29/24 1800  TROPONINIHS 4 4   BNP (last 3 results) No results for input(s): BNP in the last 8760 hours. HbA1C: No results for input(s): HGBA1C in the last 72 hours. CBG: No results for input(s): GLUCAP in the last  168 hours. Lipid Profile: No results for input(s): CHOL, HDL, LDLCALC, TRIG, CHOLHDL, LDLDIRECT in the last 72 hours. Thyroid Function Tests: No results for input(s): TSH, T4TOTAL, FREET4, T3FREE, THYROIDAB in the last 72 hours. Anemia Panel: No results for input(s): VITAMINB12, FOLATE, FERRITIN, TIBC, IRON, RETICCTPCT in the last 72 hours. Urine analysis:    Component Value Date/Time   COLORURINE AMBER (A) 05/29/2024 1610   APPEARANCEUR CLOUDY (A) 05/29/2024 1610   LABSPEC 1.026 05/29/2024 1610   PHURINE 5.0 05/29/2024 1610   GLUCOSEU NEGATIVE 05/29/2024 1610   HGBUR NEGATIVE 05/29/2024 1610   BILIRUBINUR NEGATIVE 05/29/2024 1610   KETONESUR NEGATIVE 05/29/2024 1610   PROTEINUR 100 (A) 05/29/2024 1610   NITRITE NEGATIVE 05/29/2024 1610   LEUKOCYTESUR NEGATIVE 05/29/2024 1610    Radiological Exams on Admission: I have personally reviewed images DG Chest Port 1 View Result Date: 05/29/2024 CLINICAL DATA:  Sepsis EXAM: PORTABLE CHEST 1 VIEW COMPARISON:  11/12/2022 FINDINGS: Single frontal view of the chest demonstrates a stable cardiac silhouette. No airspace disease, effusion, or pneumothorax. Right-sided PICC tip overlies the right subclavian vein. Postsurgical changes from cervical ACDF. IMPRESSION: 1. Right-sided PICC tip overlies right subclavian vein. 2. No acute intrathoracic process. Electronically Signed   By: Bobbye Burrow M.D.   On: 05/29/2024 19:12   CT Cervical Spine Wo Contrast Result Date: 05/29/2024 CLINICAL DATA:  Status post trauma. EXAM: CT CERVICAL SPINE WITHOUT CONTRAST TECHNIQUE:  Multidetector CT imaging of the cervical spine was performed without intravenous contrast. Multiplanar CT image reconstructions were also generated. RADIATION DOSE REDUCTION: This exam was performed according to the departmental dose-optimization program which includes automated exposure control, adjustment of the mA and/or kV according to patient size and/or use of iterative reconstruction technique. COMPARISON:  February 22, 2024 FINDINGS: Alignment: There is mild erosive of the normal cervical spine lordosis. Skull base and vertebrae: No acute fracture. No primary bone lesion or focal pathologic process. A metallic density fusion plate and screws are seen along the anterior aspects of the C4, C5, C6 and C7 vertebral bodies. Soft tissues and spinal canal: No prevertebral fluid or swelling. No visible canal hematoma. Disc levels: Mild endplate sclerosis is present at the levels of C2-C3 and C3-C4. Marked severity endplate sclerosis, anterior osteophyte formation and posterior bony spurring are seen at the level of C7-T1. There is anterior surgical fusion at the levels of C4-C5, C5-C6 and C6-C7. Marked severity intervertebral disc space narrowing is seen at C7-T1. Bilateral marked severity facet joint hypertrophy is noted, left greater than right. Upper chest: Negative. Other: An expansile, approximately 2.8 cm x 5.5 cm x 2.4 cm area of soft tissue attenuation (approximately 22.42 Hounsfield units) is seen within the proximal to mid left clavicle. A nondisplaced fracture deformity is seen near this region on the prior study without evidence of an associated expansile lesion. IMPRESSION: 1. No acute fracture or subluxation of the cervical spine. 2. Anterior surgical fusion at the levels of C4-C5, C5-C6 and C6-C7. 3. Marked severity degenerative changes at the level of C7-T1. 4. Expansile area of soft tissue attenuation within the proximal to mid left clavicle which may represent a hematoma. Correlation with physical  examination is recommended to determine the presence of an overlying soft tissue wound. Electronically Signed   By: Virgle Grime M.D.   On: 05/29/2024 17:16   CT Head Wo Contrast Result Date: 05/29/2024 CLINICAL DATA:  Status post trauma. EXAM: CT HEAD WITHOUT CONTRAST TECHNIQUE: Contiguous axial images were obtained from the base of the  skull through the vertex without intravenous contrast. RADIATION DOSE REDUCTION: This exam was performed according to the departmental dose-optimization program which includes automated exposure control, adjustment of the mA and/or kV according to patient size and/or use of iterative reconstruction technique. COMPARISON:  February 22, 2024 FINDINGS: Brain: No evidence of acute infarction, hemorrhage, hydrocephalus, extra-axial collection or mass lesion/mass effect. Vascular: No hyperdense vessel or unexpected calcification. Skull: Normal. Negative for fracture or focal lesion. Sinuses/Orbits: No acute finding. Other: None. IMPRESSION: No acute intracranial pathology. Electronically Signed   By: Virgle Grime M.D.   On: 05/29/2024 17:08     EKG: My personal interpretation of EKG shows:  EKG showing sinus tachycardia 101 low voltage precordial leads and nonspecific T wave abnormality and prolonged QTc.   Assessment/Plan: Principal Problem:   Acute kidney injury superimposed on stage 3a chronic kidney disease (HCC) Active Problems:   QT prolongation   Fall at home, initial encounter   Hypotension   Nonspecific chest pain   Essential hypertension   COPD (chronic obstructive pulmonary disease) (HCC)   Acute osteomyelitis of left clavicle (HCC)   Chronic hypoxic respiratory failure (HCC)   Hyperlipidemia   Hematoma of the left clavicle   Acute kidney injury (HCC)   GAD (generalized anxiety disorder)    Assessment and Plan: Acute kidney injury superimposed CKD stage IIIb -Patient presented emergency department complaining of left-sided sharp shooting  chest pain.  Further workup in the ED revealed patient is hypotensive.  CMP showing evidence of AKI elevated creatinine 1.81 and low GFR 31.  UA evidence of UTI.  CBC unremarkable.  Normal lactic acid level. -Prerenal acute kidney injury in the setting of hypotension from antihypertensive medications and dehydration. - In the ED patient has been given 1 L of LR bolus and currently on maintenance fluid LR 150 cc/h. -Holding antihypertensive medication. - Checking urine creatinine and sodium level. -Continue to monitor improvement of renal function, for nephrotoxic agent and monitoring output.   Nonspecific left-sided chest pain-noncardiac origin -Patient presenting with complaining of left-sided sharp shooting chest pain.  Troponin x 2 within normal range.  EKG showing sinus tachycardia heart rate 101, nonspecific T wave abnormality in lead aVF. -Chest x-ray no acute intracranial process. -Left-sided chest pain noncardiac origin.  Acute coronary syndrome has been ruled out.  Left-sided sharp shooting chest pain secondary from underlying left-sided clavicle osteomyelitis.   Mechanical fall secondary to hypotension -Patient presenting to emergency department complaining of mechanical fall secondary from hypotension and dizziness. - CT head no acute intracranial abnormality..  CT cervical spine no evidence of fracture and dislocation. -Continue fall precaution and checking orthostatic vital.  Treating hypotension with IV fluid and holding hypertensive regimen. -Checking CK level.  Prolonged Qtc -Holding duloxetine , Cymbalta , daptomycin , promethazine  in the setting of prolonged QTc.   -Consulted reach out to infectious disease in the daytime regarding alternative of daptomycin .   Left clavicle osteomyelitis s/p I&D, 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 Left clavicle small hematoma -Patient is borderline hypotensive.  Afebrile.  CBC no evidence of leukocytosis.  Normal lactic acid level and repeat  blood cultures are in process. Physical exam revealed minimum clavicular redness.  Patient denies any tenderness on palpation.  No evidence of hematoma on physical exam appreciated. -Continue IV ceftriaxone . -Holding IV daptomycin  in the setting of prolonged QTc.   -As alternative of daptomycin  starting IV vancomycin  with pharmacy consult. - Please consult and reach out to ID in the daytime regarding daptomycin .  COPD Hypoxic respiratory failure 2 L  oxygen  at baseline - Continue DuoNeb as needed and supplemental oxygen .  Hyperlipidemia -Holding Lipitor until CK rules out any rhabdomyolysis in the setting of recurrent fall   Generalized anxiety disorder -Holding Cymbalta  and duloxetine  in setting of prolonged QTc - Continue Atarax  as needed.  Chronic pain syndrome - Continue gabapentin  and oxycodone  as needed.   DVT prophylaxis:  SCDs.  And subcu heparin  3 times daily Code Status:  Full Code Diet: Heart healthy diet Family Communication:   Family was present at bedside, at the time of interview. Opportunity was given to ask question and all questions were answered satisfactorily.  Disposition Plan: Continue to monitor improvement of QTc, renal function.  Need to discuss with infectious disease regarding IV antibiotic. Consults: None at this time Admission status:   Inpatient, Telemetry bed  Severity of Illness: The appropriate patient status for this patient is INPATIENT. Inpatient status is judged to be reasonable and necessary in order to provide the required intensity of service to ensure the patient's safety. The patient's presenting symptoms, physical exam findings, and initial radiographic and laboratory data in the context of their chronic comorbidities is felt to place them at high risk for further clinical deterioration. Furthermore, it is not anticipated that the patient will be medically stable for discharge from the hospital within 2 midnights of admission.   * I certify  that at the point of admission it is my clinical judgment that the patient will require inpatient hospital care spanning beyond 2 midnights from the point of admission due to high intensity of service, high risk for further deterioration and high frequency of surveillance required.Aaron Aas    Gerarda Conklin, MD [facility]  How to contact the [facility] Attending or Consulting provider 7A - 7P or covering provider during after hours 7P -7A, for this patient.  Check the care team in [facility] and look for a) attending/consulting [facility] provider listed and b) the [facility] team listed Log into [email or web address] and use [facility] universal password to access. If you do not have the password, please contact the hospital operator. Locate the [facility] provider you are looking for under [facility] and page to a number that you can be directly reached. If you still have difficulty reaching the provider, please page the [facility] (Director on Call) for the Hospitalists listed on amion for assistance.  05/29/2024, 9:43 PM

## 2024-05-29 NOTE — ED Triage Notes (Signed)
 Pt BIB [facility] from home d/t Central/Lt sided that started around 1100 this morning & describes it as a shocking sensation. Denies any activity during onset but does endorse SOB with exertion, wears 3L O2 at baseline (Hx of COPD). LS clear bilaterally, was given 324 ASA & approx. 150cc NS via 20g Lt FA PIV. Pt also has stitches to her Lt clavicle that were to be removed yesterday but d/t possible infection were not removed. Does have Hx of sepsis from those stitches as well. EMS reports pt has home pain meds on board & is nodding off but A/Ox4. Also endorses pt changed her own midline drg in the Lt arm herself before waiting on the [facility] RN to arrive to her home this morning. BP's were 92/57 then 102/59. Also fell yesterday & has bruising to her face, not on thinners.

## 2024-05-29 NOTE — ED Notes (Signed)
 Standby assist to RR.

## 2024-05-29 NOTE — Progress Notes (Signed)
 Pharmacy Antibiotic Note  Deanna Yang is a 63 y.o. female admitted on 05/29/2024 with fall 2/2 hypotension and dizziness. She was recently discharged on 6/10 for  L-clavicle osteo/PJI s/p OR 6/4 for I&D and hardware removal. Per ID recs, patient was on Daptomycin  and Ceftriaxone  from 6/10 to 7/2. Per MD, due to Qtc prolongation, will switch from daptomycin  to vancomycin . Pharmacy has been consulted for vancomycin  dosing.  Scr 1.81  (BL 1.2-1.4), WBC 10, afeb  Plan: Discontinue Daptomycin  8mg /kg every 24 hours  Ceftriaxone  2g IV every 24 hours  Vancomycin  1500mg  IV once, subsequent dosing as indicated per random vancomycin  level until renal function stable and/or improved, at which time scheduled dosing can be considered   Follow up cultures, renal function, clinical improvement  Height: 5' 5 (165.1 cm) Weight: 99.8 kg (220 lb) IBW/kg (Calculated) : 57  Temp (24hrs), Avg:98.6 F (37 C), Min:98.6 F (37 C), Max:98.6 F (37 C)  Recent Labs  Lab 05/29/24 1658 05/29/24 1705 05/29/24 1819  WBC 10.0  --   --   CREATININE 1.81*  --   --   LATICACIDVEN  --  0.9 0.9    Estimated Creatinine Clearance: 38.2 mL/min (A) (by C-G formula based on SCr of 1.81 mg/dL (H)).    Allergies  Allergen Reactions   Ambien [Zolpidem Tartrate] Shortness Of Breath and Swelling    Tongue swelling, throat swelling.    Beta Adrenergic Blockers Anaphylaxis   Fish Allergy Shortness Of Breath and Rash   Iodinated Contrast Media Itching    Benadryl  50MG  prophylaxis before and after and patient reports she did fine   Latex Itching and Other (See Comments)    Blisters skin   Metoprolol  Other (See Comments)    ANY MEDICATION THAT ENDS IN -OLOL-   The patient is asthmatic.   Penicillins Shortness Of Breath, Rash and Other (See Comments)        Red Dye #40 (Allura Red) Hives and Shortness Of Breath    Contrast    Shellfish Allergy Anaphylaxis   Sulfa Antibiotics Hives and Shortness Of Breath    Amoxicillin-Pot Clavulanate Nausea And Vomiting   Aspirin  Other (See Comments)    Blisters on tongue from higher doses - tolerates 81 mg    Dye Fdc Red  [Red Dye #2 (Amaranth) (Non-Screening)] Hives    X ray DYE  (BENADRYL  50 MG PROPHYLAXIS AND AFTER AND  SHE DID FINE, PER PATIENT.).   Tape Dermatitis and Rash    Adhesive    Talwin [Pentazocine] Other (See Comments)    Unknown reaction   Levofloxacin  Rash and Other (See Comments)    headache    Antimicrobials this admission: Vancomycin  6/18 >> Ceftriaxone  6/18 >>  Microbiology results: 6/18 BCx:    Thank you for allowing pharmacy to be a part of this patient's care.  Anahis Furgeson 05/29/2024 8:20 PM

## 2024-05-29 NOTE — ED Provider Notes (Signed)
 [place] EMERGENCY DEPARTMENT AT [facility] Provider Note   CSN: [id number] Arrival date & time: 05/29/24  1537     Patient presents with: Chest Pain and Possible Stitches Infection   Deanna Yang is a 63 y.o. female with past medical history of HTN, COPD, prolonged QT, CKD, clavicular osteomyelitis  presents to emergency department via EMS for evaluation of multiple complaints  Complains of chest pain that started this morning at 1115 when she was walking back from the back room.  She described this as stabbing, midsternal chest pain that radiates to the left side of her chest.  Pain is worsened with breathing.  She does have associated shortness of breath that also worsens with exertion.  History of COPD.  On 3 L baseline.  Also complains of warmth, erythema, drainage to left clavicular wound. She had closed clavicular fracture on 02/22/24 and subsequently had osteomyelitis of clavicle requiring debridement and IV abx. Is taking daptomycin , Rocephin  via PICC line.  She changed dressing from PICC line this week as nursing staff did not come to help her.  Also complains of 2 falls this morning when she was going to the restroom.  She endorses that she fell once coming from the bathroom and hit her head on the tub so she sat down on the tub then when attempting to get up she passed out and hit her head on the side of the tub again.  Subsequently then she went back to sleep.  At baseline, she has blurred vision R greater than L and is following Optho for this.    EMS reports that initial blood pressure was 92 systolic and heart rate of 102 bpm.  They provided 324 mg aspirin  for chest pain without relief.   HPI     Prior to Admission medications   Medication Sig Start Date End Date Taking? Authorizing Provider  acetaminophen  (TYLENOL ) 500 MG tablet Take 1 tablet (500 mg total) by mouth every 6 (six) hours as needed for mild pain (pain score 1-3) or moderate pain (pain score  4-6). 03/12/24   Gawne, Meghan M, PA-C  acyclovir  ointment (ZOVIRAX ) 5 % Apply 1 application topically every 3 (three) hours as needed (mouth sores).  01/27/18   [provider]  albuterol  (PROVENTIL ) (2.5 MG/3ML) 0.083% nebulizer solution Take 2.5 mg by nebulization every 4 (four) hours as needed for shortness of breath or wheezing. 04/19/23   [provider]  albuterol  (VENTOLIN  HFA) 108 (90 Base) MCG/ACT inhaler Inhale 2 puffs into the lungs every 6 (six) hours as needed for wheezing or shortness of breath. 01/24/24   [provider]  atorvastatin  (LIPITOR) 20 MG tablet Take 20 mg by mouth daily. 10/25/22   [provider]  cefTRIAXone  (ROCEPHIN ) IVPB Inject 2 g into the vein daily for 24 days. Indication:  osteomyelitis First Dose: No Last Day of Therapy:  06/12/2024 Labs - Once weekly:  CBC/D and BMP, Labs - Once weekly: ESR and CRP Method of administration: IV Push Method of administration may be changed at the discretion of home infusion pharmacist based upon assessment of the patient and/or caregiver's ability to self-administer the medication ordered. 05/21/24 06/14/24  Butch Cashing, PA-C  cephALEXin (KEFLEX) 500 MG capsule Take 2,000 mg by mouth See admin instructions. TAKE 4 CAPSULES (2000 MG) BY MOUTH 1 HR PRIOR TO DENTAL PROCEDURE 04/01/24   [provider]  daptomycin  (CUBICIN ) IVPB Inject 600 mg into the vein daily for 24 days. Indication:  Osteomyelitis First Dose: No Last Day of Therapy:  06/12/2024 Labs - Once weekly:  CBC/D, BMP, and CPK Labs - Once weekly: ESR and CRP Method of administration: IV Push Method of administration may be changed at the discretion of home infusion pharmacist based upon assessment of the patient and/or caregiver's ability to self-administer the medication ordered. 05/21/24 06/14/24  Butch Cashing, PA-C  diazepam  (VALIUM ) 10 MG tablet Take 10 mg by mouth See admin instructions. TAKE 1 TABLET BY MOUTH 1 HR PRIOR TO  DENTAL PROCEDURE, DO NOT DRIVE OR OPERATE HEAVY MACHINERY 04/01/24   [provider]  DULoxetine  (CYMBALTA ) 60 MG capsule Take 60 mg by mouth 2 (two) times daily.    [provider]  EPINEPHrine  0.3 mg/0.3 mL IJ SOAJ injection Inject 0.3 mg into the muscle as needed for anaphylaxis. 08/31/22   [provider]  gabapentin  (NEURONTIN ) 600 MG tablet Take 1,800 mg by mouth at bedtime.    [provider]  hydrOXYzine  (ATARAX ) 50 MG tablet Take 50 mg by mouth 2 (two) times daily as needed for anxiety.    [provider]  Immune Globulin 10% (GAMMAGARD) 10 GM/100ML SOLN Inject into the vein every 30 (thirty) days.    [provider]  lamoTRIgine  (LAMICTAL ) 100 MG tablet Take 100 mg by mouth at bedtime.    [provider]  lisinopril  (ZESTRIL ) 10 MG tablet Take 10 mg by mouth in the morning.    [provider]  montelukast  (SINGULAIR ) 10 MG tablet Take 10 mg by mouth at bedtime.    [provider]  morphine  (MS CONTIN ) 30 MG 12 hr tablet Take 1 tablet (30 mg total) by mouth every 12 (twelve) hours. 04/28/17   Verlyn Goad, MD  NYSTATIN  powder Apply 1 application topically 3 (three) times daily as needed (blistering sores under breast).  05/23/17   [provider]  oxyCODONE -acetaminophen  (PERCOCET) 10-325 MG tablet Take 1 tablet by mouth every 6 (six) hours as needed for pain. Hold for sedation Patient taking differently: Take 1 tablet by mouth 4 (four) times daily. Hold for sedation 08/09/18   Vann, Jessica U, DO  OXYGEN  Inhale 3 L into the lungs See admin instructions. When laying down    [provider]  pantoprazole  (PROTONIX ) 40 MG tablet Take 40 mg by mouth daily.    [provider]  prazosin  (MINIPRESS ) 5 MG capsule Take 5 mg by mouth at bedtime. 05/03/18   [provider]  promethazine  (PHENERGAN ) 12.5 MG tablet Take 25 mg by mouth every 8 (eight) hours as needed for nausea or vomiting.  03/11/22   [provider]  QUEtiapine  (SEROQUEL ) 300 MG tablet Take 300 mg by mouth at bedtime. 08/06/18   [provider]  tiZANidine  (ZANAFLEX ) 4 MG tablet Take 8 mg by mouth 3 (three) times daily as needed for muscle spasms.    [provider]  amphetamine-dextroamphetamine (ADDERALL) 30 MG tablet Take 30 mg by mouth 2 (two) times daily.  10/02/15  [provider]  FLUoxetine  (PROZAC ) 40 MG capsule Take 1 capsule (40 mg total) by mouth daily. 10/02/15 01/10/17  Wray Heady, MD    Allergies: Ambien [zolpidem tartrate], Beta adrenergic blockers, Fish allergy, Iodinated contrast media, Latex, Metoprolol , Penicillins, Red dye #40 (allura red), Shellfish allergy, Sulfa antibiotics, Amoxicillin-pot clavulanate, Aspirin , Dye fdc red  [red dye #2 (amaranth) (non-screening)], Tape, Talwin [pentazocine], and Levofloxacin     Review of Systems  Constitutional:  Negative for chills, fatigue and fever.  Respiratory:  Negative for cough, chest tightness, shortness of breath and wheezing.   Cardiovascular:  Negative for chest pain and palpitations.  Gastrointestinal:  Negative for abdominal pain, constipation, diarrhea, nausea and vomiting.  Neurological:  Negative for dizziness, seizures, weakness, Wilbanks-headedness, numbness and headaches.    Updated Vital Signs BP (!) 103/59   Pulse 78   Temp 98.6 F (37 C) (Oral)   Resp 12   Ht 5' 5 (1.651 m)   Wt 99.8 kg   SpO2 90%   BMI 36.61 kg/m   Physical Exam Vitals and nursing note reviewed.  Constitutional:      General: She is not in acute distress.    Appearance: Normal appearance. She is not diaphoretic.  HENT:     Head: Normocephalic and atraumatic.     Comments: No hematoma nor TTP of cranium No crepitus to facial bones    Right Ear: External ear normal. No hemotympanum.     Left Ear: External ear normal. No hemotympanum.     Nose: Nose normal.     Right Nostril: No epistaxis or septal hematoma.      Left Nostril: No epistaxis or septal hematoma.     Mouth/Throat:     Mouth: Mucous membranes are moist. No injury or lacerations.   Eyes:     General:        Right eye: No discharge.        Left eye: No discharge.     Extraocular Movements: Extraocular movements intact.     Conjunctiva/sclera: Conjunctivae normal.     Pupils: Pupils are equal, round, and reactive to Kuzel.     Comments: No subconjunctival hemorrhage, hyphema, tear drop pupil, or fluid leakage bilaterally  Neck:     Vascular: No carotid bruit.   Cardiovascular:     Rate and Rhythm: Normal rate.     Pulses: Normal pulses.          Radial pulses are 2+ on the right side and 2+ on the left side.       Dorsalis pedis pulses are 2+ on the right side and 2+ on the left side.  Pulmonary:     Effort: Pulmonary effort is normal. No respiratory distress.     Breath sounds: Normal breath sounds. No wheezing.  Chest:     Chest wall: No tenderness.   Abdominal:     General: Bowel sounds are normal. There is no distension.     Palpations: Abdomen is soft.     Tenderness: There is no abdominal tenderness. There is no guarding or rebound.   Musculoskeletal:     Cervical back: Full passive range of motion without pain, normal range of motion and neck supple. No deformity, rigidity or bony tenderness. Normal range of motion.     Thoracic back: No deformity or bony tenderness. Normal range of motion.     Lumbar back: No deformity or bony tenderness. Normal range of motion.     Right hip: No bony tenderness or crepitus.     Left hip: No bony tenderness or crepitus.     Right lower leg: No edema.     Left lower leg: No edema.     Comments: PICC line to RUE. No surrounding warmth, erythema No obvious deformity to joints or long bones Pelvis stable with no shortening or rotation of LE bilaterally   Skin:    General: Skin is warm and dry.     Capillary Refill: Capillary refill takes less than  2 seconds.   Neurological:      General: No focal deficit present.     Mental Status: She is alert and oriented to person, place, and time. Mental status is at baseline.     GCS: GCS eye subscore is 4. GCS verbal subscore is 5. GCS motor subscore is 6.     Cranial Nerves: Cranial nerves 2-12 are intact. No cranial nerve deficit.     Sensory: Sensation is intact. No sensory deficit.     Motor: Motor function is intact. No weakness or tremor.     Coordination: Coordination is intact. Coordination normal. Finger-Nose-Finger Test and Heel to [facility] Test normal.     Gait: Gait is intact. Gait normal.     Deep Tendon Reflexes: Reflexes are normal and symmetric. Reflexes normal.     Comments: Acting following commands appropriately    (all labs ordered are listed, but only abnormal results are displayed) Labs Reviewed  COMPREHENSIVE METABOLIC PANEL WITH GFR - Abnormal; Notable for the following components:      Result Value   CO2 19 (*)    Glucose, Bld 117 (*)    BUN 7 (*)    Creatinine, Ser 1.81 (*)    Calcium  7.9 (*)    Total Protein 6.2 (*)    Albumin 3.2 (*)    GFR, Estimated 31 (*)    All other components within normal limits  CBC WITH DIFFERENTIAL/PLATELET - Abnormal; Notable for the following components:   RBC 3.83 (*)    Hemoglobin 10.8 (*)    HCT 34.4 (*)    Eosinophils Absolute 0.6 (*)    Basophils Absolute 0.2 (*)    All other components within normal limits  URINALYSIS, W/ REFLEX TO CULTURE (INFECTION SUSPECTED) - Abnormal; Notable for the following components:   Color, Urine AMBER (*)    APPearance CLOUDY (*)    Protein, ur 100 (*)    Bacteria, UA RARE (*)    All other components within normal limits  CULTURE, BLOOD (ROUTINE X 2)  CULTURE, BLOOD (ROUTINE X 2)  PROTIME-INR  I-STAT CG4 LACTIC ACID, ED  I-STAT CG4 LACTIC ACID, ED  TROPONIN I (HIGH SENSITIVITY)  TROPONIN I (HIGH SENSITIVITY)    EKG: EKG Interpretation Date/Time:  Wednesday May 29 2024 16:12:43 EDT Ventricular Rate:  101 PR  Interval:  135 QRS Duration:  90 QT Interval:  385 QTC Calculation: 500 R Axis:   40  Text Interpretation: Sinus tachycardia Low voltage, precordial leads Borderline T abnormalities, anterior leads Borderline prolonged QT interval No significant change since 3/25 Confirmed by Racheal Buddle [id number]) on 05/29/2024 4:14:47 PM  Radiology: CT Cervical Spine Wo Contrast Result Date: 05/29/2024 CLINICAL DATA:  Status post trauma. EXAM: CT CERVICAL SPINE WITHOUT CONTRAST TECHNIQUE: Multidetector CT imaging of the cervical spine was performed without intravenous contrast. Multiplanar CT image reconstructions were also generated. RADIATION DOSE REDUCTION: This exam was performed according to the departmental dose-optimization program which includes automated exposure control, adjustment of the mA and/or kV according to patient size and/or use of iterative reconstruction technique. COMPARISON:  February 22, 2024 FINDINGS: Alignment: There is mild erosive of the normal cervical spine lordosis. Skull base and vertebrae: No acute fracture. No primary bone lesion or focal pathologic process. A metallic density fusion plate and screws are seen along the anterior aspects of the C4, C5, C6 and C7 vertebral bodies. Soft tissues and spinal canal: No prevertebral fluid or swelling. No visible canal hematoma. Disc levels: Mild endplate sclerosis is  present at the levels of C2-C3 and C3-C4. Marked severity endplate sclerosis, anterior osteophyte formation and posterior bony spurring are seen at the level of C7-T1. There is anterior surgical fusion at the levels of C4-C5, C5-C6 and C6-C7. Marked severity intervertebral disc space narrowing is seen at C7-T1. Bilateral marked severity facet joint hypertrophy is noted, left greater than right. Upper chest: Negative. Other: An expansile, approximately 2.8 cm x 5.5 cm x 2.4 cm area of soft tissue attenuation (approximately 22.42 Hounsfield units) is seen within the proximal to mid left  clavicle. A nondisplaced fracture deformity is seen near this region on the prior study without evidence of an associated expansile lesion. IMPRESSION: 1. No acute fracture or subluxation of the cervical spine. 2. Anterior surgical fusion at the levels of C4-C5, C5-C6 and C6-C7. 3. Marked severity degenerative changes at the level of C7-T1. 4. Expansile area of soft tissue attenuation within the proximal to mid left clavicle which may represent a hematoma. Correlation with physical examination is recommended to determine the presence of an overlying soft tissue wound. Electronically Signed   By: Virgle Grime M.D.   On: 05/29/2024 17:16   CT Head Wo Contrast Result Date: 05/29/2024 CLINICAL DATA:  Status post trauma. EXAM: CT HEAD WITHOUT CONTRAST TECHNIQUE: Contiguous axial images were obtained from the base of the skull through the vertex without intravenous contrast. RADIATION DOSE REDUCTION: This exam was performed according to the departmental dose-optimization program which includes automated exposure control, adjustment of the mA and/or kV according to patient size and/or use of iterative reconstruction technique. COMPARISON:  February 22, 2024 FINDINGS: Brain: No evidence of acute infarction, hemorrhage, hydrocephalus, extra-axial collection or mass lesion/mass effect. Vascular: No hyperdense vessel or unexpected calcification. Skull: Normal. Negative for fracture or focal lesion. Sinuses/Orbits: No acute finding. Other: None. IMPRESSION: No acute intracranial pathology. Electronically Signed   By: Virgle Grime M.D.   On: 05/29/2024 17:08     Medications Ordered in the ED  lactated ringers  infusion ( Intravenous New Bag/Given 05/29/24 1708)  lactated ringers  bolus 1,000 mL (1,000 mLs Intravenous New Bag/Given 05/29/24 1806)    Clinical Course as of 05/29/24 1911  Wed May 29, 2024  2198 63 year old female with recent repeat left clavicle surgery here after multiple falls.  Wound is sutured  but still looks rather inflamed.  She is on antibiotics.  Labs showing AKI.  Will likely need admission for hydration further management [MB]    Clinical Course User Index [MB] Tonya Fredrickson, MD                                 Medical Decision Making Amount and/or Complexity of Data Reviewed Labs: ordered. Radiology: ordered.  Risk Prescription drug management. Decision regarding hospitalization.   Patient presents to the ED for concern of CP, SHOB, wound check, fall, this involves an extensive number of treatment options, and is a complaint that carries with it a high risk of complications and morbidity.  The differential diagnosis includes fracture, contusion, dislocation, ICH, ACS, pneumonia, effusion, fluid overload, infection, sepsis.  Definitely not an exhaustive list   Co morbidities that complicate the patient evaluation  See HPI   Additional history obtained:  Additional history obtained from Nursing   External records from outside source obtained and reviewed including triage RN note, ED note from 02/22/2024 from original clavicular fracture, Ortho notes for osteomyelitis   Lab Tests:  I Ordered, and  personally interpreted labs.  The pertinent results include:   No leukocytosis nor elevated lactic acid Troponin negative x 2 CBG 117 Creatinine 1.81 (was 0.9-1.02 eleven days ago) Hgb 10.8 UA contaminated but it is not appear grossly infected BC pending   Imaging Studies ordered:  I ordered imaging studies including CT head, cervical spine, chest x-ray I independently visualized and interpreted imaging which showed no traumatic injury, no cardiopulmonary pathology Ct cervical spine showed  Expansile area of soft tissue attenuation within the proximal to mid left clavicle which may represent a hematoma I agree with the radiologist interpretation   Cardiac Monitoring:  The patient was maintained on a cardiac monitor.  I personally viewed and interpreted  the cardiac monitored which showed an underlying rhythm of: Sinus tachycardia at 1 1 bpm   Medicines ordered and prescription drug management:  I ordered medication including LR  for soft pressures  Reevaluation of the patient after these medicines showed that the patient improved I have reviewed the patients home medicines and have made adjustments as needed     Consultations Obtained:  I requested consultation with hospitalist,  and discussed lab and imaging findings as well as pertinent plan - Dr.Sundil accepts patient for admission   Problem List / ED Course:  AKI Provided LR bolus S/P osteomyelitis of left clavicle S/p excision of osteomyelitis on 05/15/24 and debridement on 05/17/24 Reports compliance with 2 abx injections at home Soft BP SBP 95 No leukocytosis nor lactic but did order sepsis labs as she was mildly hypotensive and wound is erythematous and warm to touch Think that patient would benefit from admission as she does not seem to be able to manage it all at home on her own especially since nursing has not been regularly coming for whatever reason and falls Fall Has had 2 falls over past 24 hours Fortunately, does not appear to have any acute injuries from this. CT head and cervical spine without traumatic injury.  Neurologically intact. I think that there is a component of taking too much narcotic medication to this as she appears drowsy when speaking to her.  She is easily arousable and is able to stay awake during entire conversation but appears very appropriately managed with analgesia at home (Morphine  ER 30mg  bid/oxycodone  10mg  qid)   CP ASA PTA by EMS Troponin negative x 2.  Chest x-ray without pneumonia, effusion With reassuring cardiac workup, I feel that chest pain may be related to clavicular injury, possible hematoma.  Think that patient would benefit from orthopedic consult during admission   Reevaluation:  After the interventions noted above, I  reevaluated the patient and found that they have :stayed the same    Dispostion:  After consideration of the diagnostic results and the patients response to treatment, I feel that the patent would benefit from admission for AKI, ortho f/u.  Discussed ED workup, disposition, return to ED precautions with patient who expresses understanding agrees with plan.  All questions answered to their satisfaction.  They are agreeable to plan.  Final diagnoses:  Chest pain, unspecified type  AKI (acute kidney injury) [facility])    ED Discharge Orders     None          Royann Cords, PA 05/29/24 1946    Tonya Fredrickson, MD 05/30/24 1415

## 2024-05-30 ENCOUNTER — Other Ambulatory Visit: Payer: Self-pay

## 2024-05-30 ENCOUNTER — Encounter (HOSPITAL_COMMUNITY): Payer: Self-pay | Admitting: Internal Medicine

## 2024-05-30 DIAGNOSIS — N179 Acute kidney failure, unspecified: Secondary | ICD-10-CM | POA: Diagnosis not present

## 2024-05-30 DIAGNOSIS — N1831 Chronic kidney disease, stage 3a: Secondary | ICD-10-CM | POA: Diagnosis not present

## 2024-05-30 LAB — CBC
HCT: 35.1 % — ABNORMAL LOW (ref 36.0–46.0)
Hemoglobin: 10.8 g/dL — ABNORMAL LOW (ref 12.0–15.0)
MCH: 28.3 pg (ref 26.0–34.0)
MCHC: 30.8 g/dL (ref 30.0–36.0)
MCV: 91.9 fL (ref 80.0–100.0)
Platelets: 370 10*3/uL (ref 150–400)
RBC: 3.82 MIL/uL — ABNORMAL LOW (ref 3.87–5.11)
RDW: 13.7 % (ref 11.5–15.5)
WBC: 9.5 10*3/uL (ref 4.0–10.5)
nRBC: 0 % (ref 0.0–0.2)

## 2024-05-30 LAB — COMPREHENSIVE METABOLIC PANEL WITH GFR
ALT: 17 U/L (ref 0–44)
AST: 32 U/L (ref 15–41)
Albumin: 3 g/dL — ABNORMAL LOW (ref 3.5–5.0)
Alkaline Phosphatase: 108 U/L (ref 38–126)
Anion gap: 12 (ref 5–15)
BUN: 6 mg/dL — ABNORMAL LOW (ref 8–23)
CO2: 20 mmol/L — ABNORMAL LOW (ref 22–32)
Calcium: 8 mg/dL — ABNORMAL LOW (ref 8.9–10.3)
Chloride: 107 mmol/L (ref 98–111)
Creatinine, Ser: 1.37 mg/dL — ABNORMAL HIGH (ref 0.44–1.00)
GFR, Estimated: 44 mL/min — ABNORMAL LOW (ref >60)
Glucose, Bld: 115 mg/dL — ABNORMAL HIGH (ref 70–99)
Potassium: 4.1 mmol/L (ref 3.5–5.1)
Sodium: 139 mmol/L (ref 135–145)
Total Bilirubin: 0.5 mg/dL (ref 0.0–1.2)
Total Protein: 5.9 g/dL — ABNORMAL LOW (ref 6.5–8.1)

## 2024-05-30 LAB — SODIUM, URINE, RANDOM: Sodium, Ur: 59 mmol/L

## 2024-05-30 LAB — HIV ANTIBODY (ROUTINE TESTING W REFLEX): HIV Screen 4th Generation wRfx: NONREACTIVE

## 2024-05-30 LAB — CREATININE, URINE, RANDOM: Creatinine, Urine: 465 mg/dL

## 2024-05-30 MED ORDER — OXYCODONE-ACETAMINOPHEN 10-325 MG PO TABS: 1.0000 | ORAL_TABLET | Freq: Four times a day (QID) | ORAL | Status: DC | PRN

## 2024-05-30 MED ORDER — MONTELUKAST SODIUM 10 MG PO TABS
10.0000 mg | ORAL_TABLET | Freq: Every day | ORAL | Status: DC
  Administered 2024-05-30 – 2024-05-31 (×2): 10 mg via ORAL
  Filled 2024-05-30 (×2): qty 1

## 2024-05-30 MED ORDER — SODIUM CHLORIDE 0.9% FLUSH: 10.0000 mL | INTRAVENOUS | Status: DC | PRN

## 2024-05-30 MED ORDER — MORPHINE SULFATE ER 30 MG PO TBCR
30.0000 mg | EXTENDED_RELEASE_TABLET | Freq: Two times a day (BID) | ORAL | Status: DC
  Administered 2024-05-30 – 2024-06-01 (×4): 30 mg via ORAL
  Filled 2024-05-30 (×4): qty 1

## 2024-05-30 MED ORDER — OXYCODONE-ACETAMINOPHEN 5-325 MG PO TABS
1.0000 | ORAL_TABLET | Freq: Four times a day (QID) | ORAL | Status: DC | PRN
  Administered 2024-05-31 – 2024-06-01 (×4): 1 via ORAL
  Filled 2024-05-30 (×4): qty 1

## 2024-05-30 MED ORDER — OXYCODONE-ACETAMINOPHEN 5-325 MG PO TABS
1.0000 | ORAL_TABLET | Freq: Four times a day (QID) | ORAL | Status: DC | PRN
  Administered 2024-05-30 (×2): 1 via ORAL
  Filled 2024-05-30 (×2): qty 1

## 2024-05-30 MED ORDER — CHLORHEXIDINE GLUCONATE CLOTH 2 % EX PADS
6.0000 | MEDICATED_PAD | Freq: Every day | CUTANEOUS | Status: DC
  Administered 2024-05-30 – 2024-06-01 (×3): 6 via TOPICAL

## 2024-05-30 MED ORDER — OXYCODONE HCL 5 MG PO TABS: 5.0000 mg | ORAL_TABLET | Freq: Four times a day (QID) | ORAL | Status: DC | PRN

## 2024-05-30 MED ORDER — VANCOMYCIN HCL IN DEXTROSE 1-5 GM/200ML-% IV SOLN
1000.0000 mg | INTRAVENOUS | Status: DC
  Administered 2024-05-30 – 2024-05-31 (×2): 1000 mg via INTRAVENOUS
  Filled 2024-05-30 (×2): qty 200

## 2024-05-30 MED ORDER — QUETIAPINE FUMARATE 100 MG PO TABS
300.0000 mg | ORAL_TABLET | Freq: Once | ORAL | Status: AC
  Administered 2024-05-31: 300 mg via ORAL
  Filled 2024-05-30: qty 3

## 2024-05-30 MED ORDER — SODIUM CHLORIDE 0.9% FLUSH
10.0000 mL | Freq: Two times a day (BID) | INTRAVENOUS | Status: DC
  Administered 2024-05-31: 20 mL
  Administered 2024-06-01: 10 mL

## 2024-05-30 MED ORDER — MORPHINE SULFATE ER 30 MG PO TBCR: 30.0000 mg | EXTENDED_RELEASE_TABLET | Freq: Two times a day (BID) | ORAL | Status: DC

## 2024-05-30 MED ORDER — LISINOPRIL 10 MG PO TABS
10.0000 mg | ORAL_TABLET | Freq: Every morning | ORAL | Status: DC
  Administered 2024-05-30 – 2024-06-01 (×3): 10 mg via ORAL
  Filled 2024-05-30 (×3): qty 1

## 2024-05-30 MED ORDER — OXYCODONE HCL 5 MG PO TABS
5.0000 mg | ORAL_TABLET | Freq: Four times a day (QID) | ORAL | Status: DC | PRN
  Administered 2024-05-30 – 2024-06-01 (×5): 5 mg via ORAL
  Filled 2024-05-30 (×5): qty 1

## 2024-05-30 NOTE — ED Notes (Signed)
 Patient ambulatory to RR. Full linen change.

## 2024-05-30 NOTE — Progress Notes (Signed)
 Pharmacy Antibiotic Note  Deanna Yang is a 63 y.o. female admitted on 05/29/2024 with fall 2/2 hypotension and dizziness. She was recently discharged on 6/10 for  L-clavicle osteo/PJI s/p OR 6/4 for I&D and hardware removal. Per ID recs, patient was on Daptomycin  and Ceftriaxone  from 6/10 to 7/2. Per MD, due to Qtc prolongation, will switch from daptomycin  to vancomycin . Pharmacy has been consulted for vancomycin  dosing.  Scr improved to 1.37, down from 1.81 on admission. WBC 9.5, afebrile.   Plan: Start vancomycin  1000 mg IV every 24 hours (eAUC 547, Scr 1.37, Vd: 0.5, IBW)  Rocephin  per MD  Follow up cultures, renal function, clinical improvement  Height: 5' 5 (165.1 cm) Weight: 99.8 kg (220 lb) IBW/kg (Calculated) : 57  Temp (24hrs), Avg:98.2 F (36.8 C), Min:97.8 F (36.6 C), Max:98.6 F (37 C)  Recent Labs  Lab 05/29/24 1658 05/29/24 1705 05/29/24 1819 05/30/24 0538  WBC 10.0  --   --  9.5  CREATININE 1.81*  --   --  1.37*  LATICACIDVEN  --  0.9 0.9  --     Estimated Creatinine Clearance: 50.4 mL/min (A) (by C-G formula based on SCr of 1.37 mg/dL (H)).    Allergies  Allergen Reactions   Ambien [Zolpidem Tartrate] Shortness Of Breath, Swelling and Other (See Comments)    Tongue swelling, throat swelling.    Beta Adrenergic Blockers Anaphylaxis   Fish Allergy Shortness Of Breath and Rash   Iodinated Contrast Media Itching, Swelling and Other (See Comments)    Benadryl  50MG  prophylaxis before and after and patient reports she did fine. Face became swollen.   Latex Itching and Other (See Comments)    Blisters skin   Metoprolol  Other (See Comments)    ANY MEDICATION THAT ENDS IN -OLOL-   The patient is asthmatic.   Penicillins Shortness Of Breath, Rash and Other (See Comments)        Shellfish Allergy Anaphylaxis   Sulfa Antibiotics Hives and Shortness Of Breath   Tape Dermatitis, Rash and Other (See Comments)    Melts my skin    Amoxicillin-Pot Clavulanate  Nausea And Vomiting   Aspirin  Other (See Comments)    Blisters on tongue from higher doses - tolerates 81 mg    Dye Fdc Red  [Red Dye #2 (Amaranth) (Non-Screening)] Hives    X ray DYE  (BENADRYL  50 MG PROPHYLAXIS AND AFTER AND  SHE DID FINE, PER PATIENT.).   Talwin [Pentazocine] Other (See Comments)    Unknown reaction (pain med)   Levofloxacin  Rash and Other (See Comments)    headache    Antimicrobials this admission: Vancomycin  6/18 >> Ceftriaxone  6/18 >>  Microbiology results: 6/18 BCx: ngtd   Thank you for involving pharmacy in the patient's care.   Barbra Boone, PharmD PGY1 Acute Care Pharmacy Resident  05/30/2024 1:09 PM

## 2024-05-30 NOTE — ED Notes (Signed)
Patient ambulatory to RR

## 2024-05-30 NOTE — Progress Notes (Signed)
 PROGRESS NOTE  SACHIKO METHOT ZOX:[id number] DOB: [birth year] DOA: 05/29/2024 PCP: Emaline Handsome, MD  HPI/Recap of past 24 hours: Deanna Yang is a 63 y.o. female with medical history significant of COPD with chronic hypoxic respiratory failure 3 L oxygen  at baseline, history of infected left carbuncle s/p I&D with debridement 6/6 with micro graft placement and 1 g vancomycin  powder placement discharge from hospital 6/10 from orthopedics service Dr. Julio Ohm, left clavicle osteomyelitis with hardware infection discharged home with ceftriaxone  and daptomycin  per ID recommendation need IV antibiotic until 06/12/2024 followed by transition to oral antibiotic, generalized anxiety disorder, chronic pain syndrome, CKD, essential hypertension, and hyperlipidemia presented to ED c/o left-sided chest pain/left clavicular pain. Patient also reported had a fall, noted bruising of the left-sided face due to fall. In the ED, patient found to be hypotensive blood pressure 97/49, Tachycardic heart rate 103 tachypnea 22. Lactic acid, trop WNL. EKG showing sinus tachycardia 101 low voltage precordial leads and nonspecific T wave abnormality and prolonged QTc. BMP with noted elevated creatinine 1.82, baseline around 1.2. UA no evidence of UTI. CT cervical spine no acute evidence of fracture or dislocation, CT head with no acute intracranial process.  Patient admitted for further management.    Today, patient denies any chest pain, still with some left clavicular/shoulder tenderness, likely post fall.  Patient denies any dizziness, shortness of breath, abdominal pain, nausea/vomiting. No fever/chills noted.    Assessment/Plan: Principal Problem:   Acute kidney injury superimposed on stage 3a chronic kidney disease (HCC) Active Problems:   QT prolongation   Fall at home, initial encounter   Hypotension   Nonspecific chest pain   Essential hypertension   COPD (chronic obstructive pulmonary disease) (HCC)    Acute osteomyelitis of left clavicle (HCC)   Chronic hypoxic respiratory failure (HCC)   Hyperlipidemia   Hematoma of the left clavicle   Acute kidney injury (HCC)   GAD (generalized anxiety disorder)   Nonspecific left-sided chest pain/L clavicular pain Noncardiac origin Troponin x 2 within normal range EKG showing sinus tachycardia heart rate 101, nonspecific T wave abnormality in lead aVF Chest x-ray no acute intracranial process Currently denies any chest pain  Mechanical fall Likely 2/2 possible polypharmacy versus hypotension 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 CT head no acute intracranial abnormality CT cervical spine no evidence of fracture and dislocation Orthostatic vitals pending Continue fall precaution     Hypotension-resolved Reports poor oral intake BP improved, s/p IV fluids Restart home lisinopril  Monitor BP and BMP closely  AKI on CKD stage II Creatinine 1.81, baseline around 1.2, currently improving  UA with no evidence of UTI Daily BMP   Normocytic anemia/anemia of CKD Hemoglobin baseline around 10 Anemia panel pending Daily CBC  Prolonged Qtc Repeat EKG pending Holding duloxetine , Cymbalta , daptomycin , promethazine  in the setting of prolonged QTc pending repeat EKG  Left clavicle osteomyelitis s/p I&D, 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 Currently afebrile, no leukocytosis, lactic acid WNL Blood cultures X 2 pending Continue IV ceftriaxone , started on IV Vancomycin   Held IV daptomycin  in the setting of prolonged QTc. ID notified of patient and the changes made with antibiotics Dr Julio Ohm with Orthopedics notified/consulted about patient, recommended continuing antibiotics and wound dressing changes WOC consulted, recommend wound dressing changes  COPD Chronic hypoxic respiratory failure 2 L oxygen  at baseline Continue DuoNeb as needed and supplemental oxygen    Hyperlipidemia Holding Lipitor until CK rules out any rhabdomyolysis in the  setting of recurrent fall  Generalized anxiety disorder Holding Cymbalta   and duloxetine  in setting of prolonged QTc Continue Atarax  as needed   Chronic pain syndrome Continue gabapentin , Ms Contin , and percocet as needed  Obesity class 2 Lifestyle modification advised      Estimated body mass index is 36.61 kg/m as calculated from the following:   Height as of this encounter: 5' 5 (1.651 m).   Weight as of this encounter: 99.8 kg.     Code Status: Full  Family Communication: None at bedside  Disposition Plan: Status is: Inpatient Remains inpatient appropriate because: Level of care      Consultants: Orthopedics, Dr. Julio Ohm  Procedures: None  Antimicrobials: Ceftriaxone  Vancomycin   DVT prophylaxis: Heparin  [place]   Objective: Vitals:   05/30/24 0930 05/30/24 0945 05/30/24 1030 05/30/24 1100  BP: (!) 147/83 (!) 124/97 (!) 157/91 (!) 170/84  Pulse: 84 93 85 79  Resp: 17 16 13 13   Temp:      TempSrc:      SpO2: 97% 98% 98% 96%  Weight:      Height:        Intake/Output Summary (Last 24 hours) at 05/30/2024 1334 Last data filed at 05/30/2024 0247 Gross per 24 hour  Intake 1400 ml  Output --  Net 1400 ml   Filed Weights   05/29/24 1600  Weight: 99.8 kg    Exam: General: NAD  Cardiovascular: S1, S2 present Respiratory: CTAB Abdomen: Soft, nontender, nondistended, bowel sounds present Musculoskeletal: No bilateral pedal edema noted Skin: Left clavicular incision noted with some minimal drainage, stitches still in place Psychiatry: Normal mood    Data Reviewed: CBC: Recent Labs  Lab 05/29/24 1658 05/30/24 0538  WBC 10.0 9.5  NEUTROABS 5.9  --   HGB 10.8* 10.8*  HCT 34.4* 35.1*  MCV 89.8 91.9  PLT 396 370   Basic Metabolic Panel: Recent Labs  Lab 05/29/24 1658 05/30/24 0538  NA 141 139  K 3.8 4.1  CL 110 107  CO2 19* 20*  GLUCOSE 117* 115*  BUN 7* 6*  CREATININE 1.81* 1.37*  CALCIUM  7.9* 8.0*   GFR: Estimated Creatinine  Clearance: 50.4 mL/min (A) (by C-G formula based on SCr of 1.37 mg/dL (H)). Liver Function Tests: Recent Labs  Lab 05/29/24 1658 05/30/24 0538  AST 29 32  ALT 15 17  ALKPHOS 111 108  BILITOT 0.2 0.5  PROT 6.2* 5.9*  ALBUMIN 3.2* 3.0*   No results for input(s): LIPASE, AMYLASE in the last 168 hours. No results for input(s): AMMONIA in the last 168 hours. Coagulation Profile: Recent Labs  Lab 05/29/24 1658  INR 1.1   Cardiac Enzymes: No results for input(s): CKTOTAL, CKMB, CKMBINDEX, TROPONINI in the last 168 hours. BNP (last 3 results) No results for input(s): PROBNP in the last 8760 hours. HbA1C: No results for input(s): HGBA1C in the last 72 hours. CBG: No results for input(s): GLUCAP in the last 168 hours. Lipid Profile: No results for input(s): CHOL, HDL, LDLCALC, TRIG, CHOLHDL, LDLDIRECT in the last 72 hours. Thyroid Function Tests: No results for input(s): TSH, T4TOTAL, FREET4, T3FREE, THYROIDAB in the last 72 hours. Anemia Panel: No results for input(s): VITAMINB12, FOLATE, FERRITIN, TIBC, IRON, RETICCTPCT in the last 72 hours. Urine analysis:    Component Value Date/Time   COLORURINE AMBER (A) 05/29/2024 1610   APPEARANCEUR CLOUDY (A) 05/29/2024 1610   LABSPEC 1.026 05/29/2024 1610   PHURINE 5.0 05/29/2024 1610   GLUCOSEU NEGATIVE 05/29/2024 1610   HGBUR NEGATIVE 05/29/2024 1610   BILIRUBINUR NEGATIVE 05/29/2024 1610   KETONESUR  NEGATIVE 05/29/2024 1610   PROTEINUR 100 (A) 05/29/2024 1610   NITRITE NEGATIVE 05/29/2024 1610   LEUKOCYTESUR NEGATIVE 05/29/2024 1610   Sepsis Labs: @LABRCNTIP (procalcitonin:4,lacticidven:4)  ) Recent Results (from the past 240 hours)  Blood Culture (routine x 2)     Status: None (Preliminary result)   Collection Time: 05/29/24  4:58 PM   Specimen: BLOOD  Result Value Ref Range Status   Specimen Description BLOOD SITE NOT SPECIFIED  Final   Special Requests   Final     BOTTLES DRAWN AEROBIC AND ANAEROBIC Blood Culture adequate volume   Culture   Final    NO GROWTH < 24 HOURS Performed at [facility], [address], [place], [place] [address]    Report Status PENDING  Incomplete  Blood Culture (routine x 2)     Status: None (Preliminary result)   Collection Time: 05/29/24  4:58 PM   Specimen: BLOOD  Result Value Ref Range Status   Specimen Description BLOOD SITE NOT SPECIFIED  Final   Special Requests   Final    BOTTLES DRAWN AEROBIC AND ANAEROBIC Blood Culture adequate volume   Culture   Final    NO GROWTH < 24 HOURS Performed at [facility], [address], [place], [place] [address]    Report Status PENDING  Incomplete      Studies: DG Chest Port 1 View Result Date: 05/29/2024 CLINICAL DATA:  Sepsis EXAM: PORTABLE CHEST 1 VIEW COMPARISON:  11/12/2022 FINDINGS: Single frontal view of the chest demonstrates a stable cardiac silhouette. No airspace disease, effusion, or pneumothorax. Right-sided PICC tip overlies the right subclavian vein. Postsurgical changes from cervical ACDF. IMPRESSION: 1. Right-sided PICC tip overlies right subclavian vein. 2. No acute intrathoracic process. Electronically Signed   By: Bobbye Burrow M.D.   On: 05/29/2024 19:12   CT Cervical Spine Wo Contrast Result Date: 05/29/2024 CLINICAL DATA:  Status post trauma. EXAM: CT CERVICAL SPINE WITHOUT CONTRAST TECHNIQUE: Multidetector CT imaging of the cervical spine was performed without intravenous contrast. Multiplanar CT image reconstructions were also generated. RADIATION DOSE REDUCTION: This exam was performed according to the departmental dose-optimization program which includes automated exposure control, adjustment of the mA and/or kV according to patient size and/or use of iterative reconstruction technique. COMPARISON:  February 22, 2024 FINDINGS: Alignment: There is mild erosive of the normal cervical spine lordosis. Skull base and vertebrae: No acute fracture.  No primary bone lesion or focal pathologic process. A metallic density fusion plate and screws are seen along the anterior aspects of the C4, C5, C6 and C7 vertebral bodies. Soft tissues and spinal canal: No prevertebral fluid or swelling. No visible canal hematoma. Disc levels: Mild endplate sclerosis is present at the levels of C2-C3 and C3-C4. Marked severity endplate sclerosis, anterior osteophyte formation and posterior bony spurring are seen at the level of C7-T1. There is anterior surgical fusion at the levels of C4-C5, C5-C6 and C6-C7. Marked severity intervertebral disc space narrowing is seen at C7-T1. Bilateral marked severity facet joint hypertrophy is noted, left greater than right. Upper chest: Negative. Other: An expansile, approximately 2.8 cm x 5.5 cm x 2.4 cm area of soft tissue attenuation (approximately 22.42 Hounsfield units) is seen within the proximal to mid left clavicle. A nondisplaced fracture deformity is seen near this region on the prior study without evidence of an associated expansile lesion. IMPRESSION: 1. No acute fracture or subluxation of the cervical spine. 2. Anterior surgical fusion at the levels of C4-C5, C5-C6  and C6-C7. 3. Marked severity degenerative changes at the level of C7-T1. 4. Expansile area of soft tissue attenuation within the proximal to mid left clavicle which may represent a hematoma. Correlation with physical examination is recommended to determine the presence of an overlying soft tissue wound. Electronically Signed   By: Virgle Grime M.D.   On: 05/29/2024 17:16   CT Head Wo Contrast Result Date: 05/29/2024 CLINICAL DATA:  Status post trauma. EXAM: CT HEAD WITHOUT CONTRAST TECHNIQUE: Contiguous axial images were obtained from the base of the skull through the vertex without intravenous contrast. RADIATION DOSE REDUCTION: This exam was performed according to the departmental dose-optimization program which includes automated exposure control, adjustment  of the mA and/or kV according to patient size and/or use of iterative reconstruction technique. COMPARISON:  February 22, 2024 FINDINGS: Brain: No evidence of acute infarction, hemorrhage, hydrocephalus, extra-axial collection or mass lesion/mass effect. Vascular: No hyperdense vessel or unexpected calcification. Skull: Normal. Negative for fracture or focal lesion. Sinuses/Orbits: No acute finding. Other: None. IMPRESSION: No acute intracranial pathology. Electronically Signed   By: Virgle Grime M.D.   On: 05/29/2024 17:08    Scheduled Meds:  gabapentin   300 mg Oral TID   heparin  injection (subcutaneous)  5,000 Units Subcutaneous Q8H   morphine   30 mg Oral Q12H   sodium chloride  flush  3 mL Intravenous Q12H    Continuous Infusions:  sodium chloride      cefTRIAXone  (ROCEPHIN )  IV Stopped (05/29/24 2158)   vancomycin        LOS: 1 day     Veronica Gordon, MD [facility]  If 7PM-7AM, please contact night-coverage [email or web address] 05/30/2024, 1:34 PM

## 2024-05-30 NOTE — Progress Notes (Signed)
 Pt admitted from home with Picc, upon Picc assessment, pt stated her Picc became wet after showering and the dressing came off, pt attempted to redress the site. 6cm or more or catheter noted to be coming from insertion site. Picc and primary nurse notified.

## 2024-05-30 NOTE — ED Notes (Signed)
 Pt ambulatory to bathroom

## 2024-05-30 NOTE — Progress Notes (Signed)
 Peripherally Inserted Central Catheter Placement  The IV Nurse has discussed with the patient and/or persons authorized to consent for the patient, the purpose of this procedure and the potential benefits and risks involved with this procedure.  The benefits include less needle sticks, lab draws from the catheter, and the patient may be discharged home with the catheter. Risks include, but not limited to, infection, bleeding, blood clot (thrombus formation), and puncture of an artery; nerve damage and irregular heartbeat and possibility to perform a PICC exchange if needed/ordered by physician.  Alternatives to this procedure were also discussed.  Bard Power PICC patient education guide, fact sheet on infection prevention and patient information card has been provided to patient /or left at bedside.    PICC Placement Documentation  PICC Single Lumen 05/30/24 Right Cephalic 40 cm 0 cm (Active)  Indication for Insertion or Continuance of Line Prolonged intravenous therapies 05/30/24 2151  Exposed Catheter (cm) 0 cm 05/30/24 2151  Site Assessment Clean, Dry, Intact 05/30/24 2151  Line Status Flushed;Saline locked;Blood return noted 05/30/24 2151  Dressing Type Transparent;Securing device 05/30/24 2151  Dressing Status Antimicrobial disc/dressing in place;Clean, Dry, Intact 05/30/24 2151  Line Care Connections checked and tightened 05/30/24 2151  Line Adjustment (NICU/IV Team Only) No 05/30/24 2151  Dressing Intervention New dressing 05/30/24 2151  Dressing Change Due 06/06/24 05/30/24 2151       Jawann Urbani, Miles Allan 05/30/2024, 9:51 PM

## 2024-05-30 NOTE — Consult Note (Addendum)
 WOC Nurse Consult Note: Reason for Consult: Requested to provide topical treatment recommendations for left clavicle wound. Consult performed remotely after review of photo and progress notes in the EMR.  Left shoulder had surgery performed by Dr Julio Ohm of the ortho service on 6/6. Sutures are intact and well approximated and are due to be discontinued, according to the progress notes. Please refer to Dr Julio Ohm for further plan of care.  Dressing procedure/placement/frequency: Topical treatment orders provided for bedside nurses to perform: Apply Xeroform gauze to left clavicle wound Q day, then cover with foam dressing.  Change foam dressing Q 3 days or PRN soiling.  Please re-consult if further assistance is needed.  Thank-you,  Wiliam Harder MSN, RN, CWOCN, [place], CNS [phone]

## 2024-05-31 DIAGNOSIS — N179 Acute kidney failure, unspecified: Secondary | ICD-10-CM | POA: Diagnosis not present

## 2024-05-31 DIAGNOSIS — N1831 Chronic kidney disease, stage 3a: Secondary | ICD-10-CM | POA: Diagnosis not present

## 2024-05-31 LAB — IRON AND TIBC
Iron: 82 ug/dL (ref 28–170)
Saturation Ratios: 26 % (ref 10.4–31.8)
TIBC: 318 ug/dL (ref 250–450)
UIBC: 236 ug/dL

## 2024-05-31 LAB — FERRITIN: Ferritin: 30 ng/mL (ref 11–307)

## 2024-05-31 LAB — BASIC METABOLIC PANEL WITH GFR
Anion gap: 11 (ref 5–15)
BUN: 6 mg/dL — ABNORMAL LOW (ref 8–23)
CO2: 23 mmol/L (ref 22–32)
Calcium: 8.2 mg/dL — ABNORMAL LOW (ref 8.9–10.3)
Chloride: 106 mmol/L (ref 98–111)
Creatinine, Ser: 1.03 mg/dL — ABNORMAL HIGH (ref 0.44–1.00)
GFR, Estimated: >60 mL/min (ref >60)
Glucose, Bld: 110 mg/dL — ABNORMAL HIGH (ref 70–99)
Potassium: 3.2 mmol/L — ABNORMAL LOW (ref 3.5–5.1)
Sodium: 140 mmol/L (ref 135–145)

## 2024-05-31 LAB — CBC WITH DIFFERENTIAL/PLATELET
Abs Immature Granulocytes: 0.02 10*3/uL (ref 0.00–0.07)
Basophils Absolute: 0.1 10*3/uL (ref 0.0–0.1)
Basophils Relative: 1 %
Eosinophils Absolute: 0.6 10*3/uL — ABNORMAL HIGH (ref 0.0–0.5)
Eosinophils Relative: 8 %
HCT: 34.5 % — ABNORMAL LOW (ref 36.0–46.0)
Hemoglobin: 11.1 g/dL — ABNORMAL LOW (ref 12.0–15.0)
Immature Granulocytes: 0 %
Lymphocytes Relative: 32 %
Lymphs Abs: 2.3 10*3/uL (ref 0.7–4.0)
MCH: 27.5 pg (ref 26.0–34.0)
MCHC: 32.2 g/dL (ref 30.0–36.0)
MCV: 85.6 fL (ref 80.0–100.0)
Monocytes Absolute: 0.4 10*3/uL (ref 0.1–1.0)
Monocytes Relative: 5 %
Neutro Abs: 3.9 10*3/uL (ref 1.7–7.7)
Neutrophils Relative %: 54 %
Platelets: 390 10*3/uL (ref 150–400)
RBC: 4.03 MIL/uL (ref 3.87–5.11)
RDW: 13.4 % (ref 11.5–15.5)
WBC: 7.3 10*3/uL (ref 4.0–10.5)
nRBC: 0 % (ref 0.0–0.2)

## 2024-05-31 LAB — VITAMIN B12: Vitamin B-12: 85 pg/mL — ABNORMAL LOW (ref 180–914)

## 2024-05-31 LAB — CULTURE, BLOOD (ROUTINE X 2)

## 2024-05-31 LAB — CK: Total CK: 845 U/L — ABNORMAL HIGH (ref 38–234)

## 2024-05-31 LAB — MAGNESIUM: Magnesium: 2 mg/dL (ref 1.7–2.4)

## 2024-05-31 LAB — FOLATE: Folate: 6.3 ng/mL (ref >5.9)

## 2024-05-31 MED ORDER — CYANOCOBALAMIN 1000 MCG/ML IJ SOLN
1000.0000 ug | Freq: Once | INTRAMUSCULAR | Status: DC
  Filled 2024-05-31: qty 1

## 2024-05-31 MED ORDER — VITAMIN B-12 1000 MCG PO TABS
1000.0000 ug | ORAL_TABLET | Freq: Every day | ORAL | Status: DC
  Administered 2024-06-01: 1000 ug via ORAL
  Filled 2024-05-31 (×2): qty 1

## 2024-05-31 MED ORDER — QUETIAPINE FUMARATE 100 MG PO TABS
300.0000 mg | ORAL_TABLET | Freq: Every day | ORAL | Status: DC
  Administered 2024-05-31: 300 mg via ORAL
  Filled 2024-05-31: qty 3

## 2024-05-31 MED ORDER — POTASSIUM CHLORIDE CRYS ER 20 MEQ PO TBCR
40.0000 meq | EXTENDED_RELEASE_TABLET | ORAL | Status: AC
  Administered 2024-05-31 (×2): 40 meq via ORAL
  Filled 2024-05-31 (×2): qty 2

## 2024-05-31 MED ORDER — SODIUM CHLORIDE 0.9 % IV SOLN: INTRAVENOUS | Status: AC

## 2024-05-31 MED ORDER — ONDANSETRON HCL 4 MG/2ML IJ SOLN
4.0000 mg | Freq: Three times a day (TID) | INTRAMUSCULAR | Status: DC | PRN
  Administered 2024-05-31: 4 mg via INTRAVENOUS
  Filled 2024-05-31: qty 2

## 2024-05-31 NOTE — Progress Notes (Signed)
 PROGRESS NOTE  Deanna Yang ION:[id number] DOB: October 05, 1961 DOA: 05/29/2024 PCP: Emaline Handsome, MD  HPI/Recap of past 24 hours: Deanna Yang is a 63 y.o. female with medical history significant of COPD with chronic hypoxic respiratory failure 3 L oxygen  at baseline, history of infected left carbuncle s/p I&D with debridement 6/6 with micro graft placement and 1 g vancomycin  powder placement discharge from hospital 6/10 from orthopedics service Dr. Julio Ohm, left clavicle osteomyelitis with hardware infection discharged home with ceftriaxone  and daptomycin  per ID recommendation need IV antibiotic until 06/12/2024 followed by transition to oral antibiotic, generalized anxiety disorder, chronic pain syndrome, CKD, essential hypertension, and hyperlipidemia presented to ED c/o left-sided chest pain/left clavicular pain. Patient also reported had a fall, noted bruising of the left-sided face due to fall. In the ED, patient found to be hypotensive blood pressure 97/49, Tachycardic heart rate 103 tachypnea 22. Lactic acid, trop WNL. EKG showing sinus tachycardia 101 low voltage precordial leads and nonspecific T wave abnormality and prolonged QTc. BMP with noted elevated creatinine 1.82, baseline around 1.2. UA no evidence of UTI. CT cervical spine no acute evidence of fracture or dislocation, CT head with no acute intracranial process.  Patient admitted for further management.    Today, patient denied any new complaints.  Patient requesting to see Dr. Julio Ohm.    Assessment/Plan: Principal Problem:   Acute kidney injury superimposed on stage 3a chronic kidney disease (HCC) Active Problems:   QT prolongation   Fall at home, initial encounter   Hypotension   Nonspecific chest pain   Essential hypertension   COPD (chronic obstructive pulmonary disease) (HCC)   Acute osteomyelitis of left clavicle (HCC)   Chronic hypoxic respiratory failure (HCC)   Hyperlipidemia   Hematoma of the left clavicle    Acute kidney injury (HCC)   GAD (generalized anxiety disorder)   Nonspecific left-sided chest pain/L clavicular pain Noncardiac origin Troponin x 2 within normal range EKG showing sinus tachycardia heart rate 101, nonspecific T wave abnormality in lead aVF Chest x-ray no acute intracranial process Currently denies any chest pain  Mechanical fall Likely 2/2 possible polypharmacy versus hypotension 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 CT head no acute intracranial abnormality CT cervical spine no evidence of fracture and dislocation Orthostatic vitals negative (done after IVF) Continue fall precaution     Hypotension-resolved Hypertension Reports poor oral intake BP improved, s/p IV fluids Continue lisinopril  BMP  AKI on CKD stage II Creatinine 1.81, baseline around 1.2, currently improving  UA with no evidence of UTI Daily BMP  Hypokalemia Replace as needed  Mild rhabdomyolysis CK 845 Start IV fluids Daily CK  Normocytic anemia/anemia of CKD Hemoglobin baseline around 10 Anemia panel pending Daily CBC  Prolonged Qtc Repeat EKG with normalized QTc Holding duloxetine , Cymbalta , daptomycin , promethazine  in the setting of prolonged QTc   Vitamin B12 deficiency Continue [place] on oral vitamin B12 supplementation  Left clavicle osteomyelitis s/p I&D, 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 Currently afebrile, no leukocytosis, lactic acid WNL Blood cultures X 2 NGTD Continue IV ceftriaxone , started on IV Vancomycin   Held IV daptomycin  in the setting of prolonged QTc. ID notified of patient and the changes made with antibiotics Dr Julio Ohm with Orthopedics notified/consulted about patient, recommended continuing antibiotics and wound dressing changes WOC consulted, recommend wound dressing changes  COPD Chronic hypoxic respiratory failure 2 L oxygen  at baseline Continue DuoNeb as needed and supplemental oxygen    Hyperlipidemia Holding Lipitor 2/2 mild rhabdomyolysis in  the setting of recurrent fall  Generalized anxiety disorder Holding Cymbalta  and duloxetine  in setting of prolonged QTc Continue Atarax  as needed   Chronic pain syndrome Continue gabapentin , Ms Contin , and percocet as needed  Obesity class 2 Lifestyle modification advised      Estimated body mass index is 35.33 kg/m as calculated from the following:   Height as of this encounter: 5' 5 (1.651 m).   Weight as of this encounter: 96.3 kg.     Code Status: Full  Family Communication: None at bedside  Disposition Plan: Status is: Inpatient Remains inpatient appropriate because: Level of care      Consultants: Orthopedics, Dr. Julio Ohm  Procedures: None  Antimicrobials: Ceftriaxone  Vancomycin   DVT prophylaxis: Heparin  [place]   Objective: Vitals:   05/31/24 0445 05/31/24 0719 05/31/24 1119 05/31/24 1528  BP: 132/81 131/72 (!) 140/77 (!) 144/80  Pulse: 69 72 69 75  Resp: 16 16 16 16   Temp: 97.8 F (36.6 C) 98.3 F (36.8 C) 97.7 F (36.5 C) (!) 97.5 F (36.4 C)  TempSrc: Oral Oral Oral Oral  SpO2: 93% 99% 96% 96%  Weight: 96.3 kg     Height:        Intake/Output Summary (Last 24 hours) at 05/31/2024 1706 Last data filed at 05/31/2024 0924 Gross per 24 hour  Intake 60 ml  Output 500 ml  Net -440 ml   Filed Weights   05/29/24 1600 05/30/24 1541 05/31/24 0445  Weight: 99.8 kg 98.5 kg 96.3 kg    Exam: General: NAD  Cardiovascular: S1, S2 present Respiratory: CTAB Abdomen: Soft, nontender, nondistended, bowel sounds present Musculoskeletal: No bilateral pedal edema noted Skin: Left clavicular incision noted with some minimal drainage, stitches still in place Psychiatry: Normal mood    Data Reviewed: CBC: Recent Labs  Lab 05/29/24 1658 05/30/24 0538 05/31/24 0222  WBC 10.0 9.5 7.3  NEUTROABS 5.9  --  3.9  HGB 10.8* 10.8* 11.1*  HCT 34.4* 35.1* 34.5*  MCV 89.8 91.9 85.6  PLT 396 370 390   Basic Metabolic Panel: Recent Labs  Lab 05/29/24 1658  05/30/24 0538 05/31/24 0222 05/31/24 0856  NA 141 139 140  --   K 3.8 4.1 3.2*  --   CL 110 107 106  --   CO2 19* 20* 23  --   GLUCOSE 117* 115* 110*  --   BUN 7* 6* 6*  --   CREATININE 1.81* 1.37* 1.03*  --   CALCIUM  7.9* 8.0* 8.2*  --   MG  --   --   --  2.0   GFR: Estimated Creatinine Clearance: 65.8 mL/min (A) (by C-G formula based on SCr of 1.03 mg/dL (H)). Liver Function Tests: Recent Labs  Lab 05/29/24 1658 05/30/24 0538  AST 29 32  ALT 15 17  ALKPHOS 111 108  BILITOT 0.2 0.5  PROT 6.2* 5.9*  ALBUMIN 3.2* 3.0*   No results for input(s): LIPASE, AMYLASE in the last 168 hours. No results for input(s): AMMONIA in the last 168 hours. Coagulation Profile: Recent Labs  Lab 05/29/24 1658  INR 1.1   Cardiac Enzymes: Recent Labs  Lab 05/31/24 0222  CKTOTAL 845*   BNP (last 3 results) No results for input(s): PROBNP in the last 8760 hours. HbA1C: No results for input(s): HGBA1C in the last 72 hours. CBG: No results for input(s): GLUCAP in the last 168 hours. Lipid Profile: No results for input(s): CHOL, HDL, LDLCALC, TRIG, CHOLHDL, LDLDIRECT in the last 72 hours. Thyroid Function Tests: No results for input(s): TSH, T4TOTAL,  FREET4, T3FREE, THYROIDAB in the last 72 hours. Anemia Panel: Recent Labs    05/31/24 0222  VITAMINB12 85*  FOLATE 6.3  FERRITIN 30  TIBC 318  IRON 82   Urine analysis:    Component Value Date/Time   COLORURINE AMBER (A) 05/29/2024 1610   APPEARANCEUR CLOUDY (A) 05/29/2024 1610   LABSPEC 1.026 05/29/2024 1610   PHURINE 5.0 05/29/2024 1610   GLUCOSEU NEGATIVE 05/29/2024 1610   HGBUR NEGATIVE 05/29/2024 1610   BILIRUBINUR NEGATIVE 05/29/2024 1610   KETONESUR NEGATIVE 05/29/2024 1610   PROTEINUR 100 (A) 05/29/2024 1610   NITRITE NEGATIVE 05/29/2024 1610   LEUKOCYTESUR NEGATIVE 05/29/2024 1610   Sepsis Labs: @LABRCNTIP (procalcitonin:4,lacticidven:4)  ) Recent Results (from the past 240  hours)  Blood Culture (routine x 2)     Status: None (Preliminary result)   Collection Time: 05/29/24  4:58 PM   Specimen: BLOOD  Result Value Ref Range Status   Specimen Description BLOOD SITE NOT SPECIFIED  Final   Special Requests   Final    BOTTLES DRAWN AEROBIC AND ANAEROBIC Blood Culture adequate volume   Culture   Final    NO GROWTH 2 DAYS Performed at [facility], [address] [place], [place] [address]    Report Status PENDING  Incomplete  Blood Culture (routine x 2)     Status: None (Preliminary result)   Collection Time: 05/29/24  4:58 PM   Specimen: BLOOD  Result Value Ref Range Status   Specimen Description BLOOD SITE NOT SPECIFIED  Final   Special Requests   Final    BOTTLES DRAWN AEROBIC AND ANAEROBIC Blood Culture adequate volume   Culture   Final    NO GROWTH 2 DAYS Performed at [facility], [address] [place], [place] [address]    Report Status PENDING  Incomplete      Studies: US  EKG SITE RITE Result Date: 05/30/2024 If Site Rite image not attached, placement could not be confirmed due to current cardiac rhythm.  US  EKG SITE RITE Result Date: 05/30/2024 If Site Rite image not attached, placement could not be confirmed due to current cardiac rhythm.   Scheduled Meds:  Chlorhexidine  Gluconate Cloth  6 each Topical Daily   cyanocobalamin   1,000 mcg Subcutaneous Once   [START ON 06/01/2024] vitamin B-12  1,000 mcg Oral Daily   gabapentin   300 mg Oral TID   heparin  injection (subcutaneous)  5,000 Units Subcutaneous Q8H   lisinopril   10 mg Oral q AM   montelukast   10 mg Oral QHS   morphine   30 mg Oral Q12H   sodium chloride  flush  10-40 mL Intracatheter Q12H   sodium chloride  flush  3 mL Intravenous Q12H    Continuous Infusions:  sodium chloride  75 mL/hr at 05/31/24 1038   cefTRIAXone  (ROCEPHIN )  IV Stopped (05/30/24 2342)   vancomycin  Stopped (05/31/24 0016)     LOS: 2 days     Veronica Gordon, MD [facility]  If 7PM-7AM, please contact night-coverage [email or web address] 05/31/2024, 5:06 PM

## 2024-05-31 NOTE — Progress Notes (Signed)
 Patient ID: Deanna Yang, female   DOB: Jan 01, 1961, 63 y.o.   MRN: [id number] Examination of the left shoulder shows decreased dermatitis.  There is still serous drainage from the medial aspect of the wound.  Patient will continue her IV antibiotics.  Patient is safe for discharge from an orthopedic standpoint.  Recommend she continue Dial soap cleansing dry dressing changes daily.  I will follow-up in the office in 1 week.

## 2024-05-31 NOTE — Progress Notes (Incomplete)
 OPAT PTA

## 2024-05-31 NOTE — TOC CM/SW Note (Addendum)
 Transition of Care [facility]) - Inpatient Brief Assessment   Patient Details  Name: Deanna Yang MRN: [id number] Date of Birth: [birth year]  Transition of Care [facility]) CM/SW Contact:    Jennett Model, RN Phone Number: 05/31/2024, 2:50 PM   Clinical Narrative: From home alone, indep, has PCP and insurance on file, states has [facility] services in place with Advance infusion Nurse for IVIG once a month, has no  DME at home.  States family member will transport them home at [employer] and family is support system, states gets medications from [facility] on [address] in [place].  Pta self ambulatory. NCM spoke with Nurse Valinda Gault (infusion Nurse) [phone].   Per infusion Nurse, please call her to let her know when patient is being discharged so that she can get the medication ordered thru their pharmacy.  Patient states she does have some of the medication at home.  She eats a vegetarian diet, and  she has no scale.     Transition of Care Asessment: Insurance and Status: Insurance coverage has been reviewed Patient has primary care physician: Yes Home environment has been reviewed: home alone Prior level of function:: indep Prior/Current Home Services: No current home services Social Drivers of Health Review: SDOH reviewed no interventions necessary Readmission risk has been reviewed: Yes Transition of care needs: transition of care needs identified, TOC will continue to follow

## 2024-05-31 NOTE — Plan of Care (Signed)

## 2024-06-01 DIAGNOSIS — N179 Acute kidney failure, unspecified: Secondary | ICD-10-CM | POA: Diagnosis not present

## 2024-06-01 DIAGNOSIS — T847XXA Infection and inflammatory reaction due to other internal orthopedic prosthetic devices, implants and grafts, initial encounter: Secondary | ICD-10-CM | POA: Diagnosis not present

## 2024-06-01 DIAGNOSIS — N1831 Chronic kidney disease, stage 3a: Secondary | ICD-10-CM | POA: Diagnosis not present

## 2024-06-01 DIAGNOSIS — M86112 Other acute osteomyelitis, left shoulder: Secondary | ICD-10-CM | POA: Diagnosis not present

## 2024-06-01 LAB — CBC WITH DIFFERENTIAL/PLATELET
Abs Immature Granulocytes: 0.02 10*3/uL (ref 0.00–0.07)
Basophils Absolute: 0.1 10*3/uL (ref 0.0–0.1)
Basophils Relative: 2 %
Eosinophils Absolute: 0.6 10*3/uL — ABNORMAL HIGH (ref 0.0–0.5)
Eosinophils Relative: 9 %
HCT: 36.9 % (ref 36.0–46.0)
Hemoglobin: 11.6 g/dL — ABNORMAL LOW (ref 12.0–15.0)
Immature Granulocytes: 0 %
Lymphocytes Relative: 42 %
Lymphs Abs: 2.8 10*3/uL (ref 0.7–4.0)
MCH: 27.8 pg (ref 26.0–34.0)
MCHC: 31.4 g/dL (ref 30.0–36.0)
MCV: 88.5 fL (ref 80.0–100.0)
Monocytes Absolute: 0.4 10*3/uL (ref 0.1–1.0)
Monocytes Relative: 5 %
Neutro Abs: 2.8 10*3/uL (ref 1.7–7.7)
Neutrophils Relative %: 42 %
Platelets: 318 10*3/uL (ref 150–400)
RBC: 4.17 MIL/uL (ref 3.87–5.11)
RDW: 13.7 % (ref 11.5–15.5)
WBC: 6.7 10*3/uL (ref 4.0–10.5)
nRBC: 0 % (ref 0.0–0.2)

## 2024-06-01 LAB — BASIC METABOLIC PANEL WITH GFR
Anion gap: 11 (ref 5–15)
BUN: 7 mg/dL — ABNORMAL LOW (ref 8–23)
CO2: 24 mmol/L (ref 22–32)
Calcium: 8.5 mg/dL — ABNORMAL LOW (ref 8.9–10.3)
Chloride: 106 mmol/L (ref 98–111)
Creatinine, Ser: 1.01 mg/dL — ABNORMAL HIGH (ref 0.44–1.00)
GFR, Estimated: >60 mL/min (ref >60)
Glucose, Bld: 129 mg/dL — ABNORMAL HIGH (ref 70–99)
Potassium: 4 mmol/L (ref 3.5–5.1)
Sodium: 141 mmol/L (ref 135–145)

## 2024-06-01 LAB — CK: Total CK: 325 U/L — ABNORMAL HIGH (ref 38–234)

## 2024-06-01 MED ORDER — VANCOMYCIN HCL 1750 MG/350ML IV SOLN
1750.0000 mg | INTRAVENOUS | Status: DC
  Filled 2024-06-01: qty 350

## 2024-06-01 MED ORDER — CYANOCOBALAMIN 1000 MCG PO TABS: 1000.0000 ug | ORAL_TABLET | Freq: Every day | ORAL | 0 refills | Status: AC

## 2024-06-01 NOTE — Care Management (Addendum)
 12:59 Post DC note Requested by [facility] to enter HH orders. Order placed, requested MD to cosign.  2:00 [facility] requesting clarification if patient is still on IV Abx, and would be continuing with medications currently on at home after DC, as mentioned in DC note. This was not brought to [facility] attention prior to DC.  Message out to attending DR Donnamarie for clarification, awaiting response  15:00 per MD, patient will continue with same doses of IV abx she was on prior to admission, I have let [facility] and [facility] know.

## 2024-06-01 NOTE — Progress Notes (Signed)
 Pharmacy Antibiotic Note  Deanna Yang is a 63 y.o. female admitted on 05/29/2024 with fall 2/2 hypotension and dizziness. She was recently discharged on 6/10 for  L-clavicle osteo/PJI s/p OR 6/4 for I&D and hardware removal. Per ID recs, patient was on Daptomycin  and Ceftriaxone  from 6/10 to 7/2. Per MD, due to Qtc prolongation, will switch from daptomycin  to vancomycin . Pharmacy has been consulted for vancomycin  dosing.  Scr improved from 1.37>>1.01, down from 1.81 on admission. WBC 9.5>>6.7,afebrile.   Plan: Increase vancomycin  to 1750 mg IV every 24 hours (eAUC 524, Scr 1.01, Vd: 0.5, IBW)  Rocephin  per MD  Follow up cultures, renal function, clinical improvement  Height: 5' 5 (165.1 cm) Weight: 96.3 kg (212 lb 4.9 oz) IBW/kg (Calculated) : 57  Temp (24hrs), Avg:98 F (36.7 C), Min:97.5 F (36.4 C), Max:98.8 F (37.1 C)  Recent Labs  Lab 05/29/24 1658 05/29/24 1705 05/29/24 1819 05/30/24 0538 05/31/24 0222 06/01/24 0300  WBC 10.0  --   --  9.5 7.3 6.7  CREATININE 1.81*  --   --  1.37* 1.03* 1.01*  LATICACIDVEN  --  0.9 0.9  --   --   --     Estimated Creatinine Clearance: 67.1 mL/min (A) (by C-G formula based on SCr of 1.01 mg/dL (H)).    Allergies  Allergen Reactions   Ambien [Zolpidem Tartrate] Shortness Of Breath, Swelling and Other (See Comments)    Tongue swelling, throat swelling.    Beta Adrenergic Blockers Anaphylaxis   Fish Allergy Shortness Of Breath and Rash   Iodinated Contrast Media Itching, Swelling and Other (See Comments)    Benadryl  50MG  prophylaxis before and after and patient reports she did fine. Face became swollen.   Latex Itching and Other (See Comments)    Blisters skin   Metoprolol  Other (See Comments)    ANY MEDICATION THAT ENDS IN -OLOL-   The patient is asthmatic.   Penicillins Shortness Of Breath, Rash and Other (See Comments)        Shellfish Allergy Anaphylaxis   Sulfa Antibiotics Hives and Shortness Of Breath   Tape  Dermatitis, Rash and Other (See Comments)    Melts my skin    Amoxicillin-Pot Clavulanate Nausea And Vomiting   Aspirin  Other (See Comments)    Blisters on tongue from higher doses - tolerates 81 mg    Dye Fdc Red  [Red Dye #2 (Amaranth) (Non-Screening)] Hives    X ray DYE  (BENADRYL  50 MG PROPHYLAXIS AND AFTER AND  SHE DID FINE, PER PATIENT.).   [place]win [Pentazocine] Other (See Comments)    Unknown reaction (pain med)   Levofloxacin  Rash and Other (See Comments)    headache    Antimicrobials this admission: Vancomycin  6/18 >> Ceftriaxone  6/18 >>  Microbiology results: 6/18 BCx: ngtd   Krystie Leiter A. Lyle, PharmD, BCPS, FNKF Clinical Pharmacist [place] Please utilize Amion for appropriate phone number to reach the unit pharmacist [facility])  06/01/2024 8:06 AM

## 2024-06-01 NOTE — Discharge Summary (Signed)
 Physician Discharge Summary   Patient: Deanna Yang MRN: [id number] DOB: [birth year]  Admit date:     05/29/2024  Discharge date: 06/01/24  Discharge Physician: Lebron JINNY Cage   PCP: Sophronia Ozell BROCKS, MD   Recommendations at discharge:   Follow-up with PCP in 1 week Follow-up with Dr. Harden as scheduled  Discharge Diagnoses: Principal Problem:   Acute kidney injury superimposed on stage 3a chronic kidney disease (HCC) Active Problems:   QT prolongation   Fall at home, initial encounter   Hypotension   Nonspecific chest pain   Essential hypertension   COPD (chronic obstructive pulmonary disease) (HCC)   Acute osteomyelitis of left clavicle (HCC)   Chronic hypoxic respiratory failure (HCC)   Hyperlipidemia   Hematoma of the left clavicle   Acute kidney injury (HCC)   GAD (generalized anxiety disorder)    Hospital Course: Deanna Yang is a 63 y.o. female with medical history significant of COPD with chronic hypoxic respiratory failure 3 L oxygen  at baseline, history of infected left carbuncle s/p I&D with debridement 6/6 with micro graft placement and 1 g vancomycin  powder placement discharge from hospital 6/10 from orthopedics service Dr. Harden, left clavicle osteomyelitis with hardware infection discharged home with ceftriaxone  and daptomycin  per ID recommendation need IV antibiotic until 06/12/2024 followed by transition to oral antibiotic, generalized anxiety disorder, chronic pain syndrome, CKD, essential hypertension, and hyperlipidemia presented to ED c/o left-sided chest pain/left clavicular pain. Patient also reported had a fall, noted bruising of the left-sided face due to fall. In the ED, patient found to be hypotensive blood pressure 97/49, Tachycardic heart rate 103 tachypnea 22. Lactic acid, trop WNL. EKG showing sinus tachycardia 101 low voltage precordial leads and nonspecific T wave abnormality and prolonged QTc. BMP with noted elevated creatinine 1.82, baseline  around 1.2. UA no evidence of UTI. CT cervical spine no acute evidence of fracture or dislocation, CT head with no acute intracranial process.  Patient admitted for further management.     Today, patient denies any new complaints.  Discussed extensively about the need to take vitamin B12, as patient refused her subcutaneous dose here in the hospital, verbalized understanding.  Follow-up with PCP in 1 week as well as Dr. 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 CT head no acute intracranial abnormality CT cervical spine no evidence of fracture and dislocation Orthostatic vitals negative (done after IVF) Continue fall precaution      Hypotension-resolved Hypertension Reports poor oral intake BP improved, s/p IV fluids Continue lisinopril    AKI on CKD stage II Creatinine 1.81, baseline around 1.2, currently improving  UA with no evidence of UTI   Hypokalemia Replaced as needed   Mild rhabdomyolysis CK 845-->325 S/p IVF, encourage oral intake Restart daptomycin  as recommended by ID, and recheck CK levels as scheduled   Normocytic anemia/anemia of CKD Vitamin B12 deficiency Hemoglobin baseline around 10 Anemia panel WNL except for vitamin B12-->85 Continue [name] on oral vitamin B12 supplementation   Prolonged Qtc Repeat EKG with normalized QTc   Left clavicle osteomyelitis s/p I&D, 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 Currently afebrile, no leukocytosis, lactic acid WNL Blood cultures X 2 NGTD Continue  IV ceftriaxone , continue home daptomycin  ID notified, okay to continue previously scheduled  daptomycin  Dr Harden with Orthopedics consulted, recommended continuing antibiotics and wound dressing changes WOC consulted, appreciated recommendations   COPD Chronic hypoxic respiratory failure 2 L oxygen  at baseline Continue DuoNeb as needed and supplemental oxygen    Hyperlipidemia Restart Lipitor   Generalized anxiety disorder Cymbalta  and duloxetine  Continue Atarax  as needed   Chronic pain syndrome Continue gabapentin , Ms Contin , and percocet as needed   Obesity class 2 Lifestyle modification advised       Pain control -   Controlled Substance Reporting System database was reviewed. and patient was instructed, not to drive, operate heavy machinery, perform activities at heights, swimming or participation in water  activities or provide baby-sitting services while on Pain, Sleep and Anxiety Medications; until their outpatient Physician has advised to do so again. Also recommended to not to take more than prescribed Pain, Sleep and Anxiety Medications.    Consultants: Orthopedics, Dr. Harden Procedures performed: None Disposition: Home Diet recommendation:  Cardiac diet   DISCHARGE MEDICATION: Allergies as of 06/01/2024       Reactions   Ambien [zolpidem Tartrate] Shortness Of Breath, Swelling, Other (See Comments)   Tongue swelling, throat swelling.   Beta Adrenergic Blockers Anaphylaxis   Fish Allergy Shortness Of Breath, Rash   Iodinated Contrast Media Itching, Swelling, Other (See Comments)   Benadryl  50MG  prophylaxis before and after and patient reports she did fine. Face became swollen.   Latex Itching, Other (See Comments)   Blisters skin   Metoprolol  Other (See Comments)   ANY MEDICATION THAT ENDS IN -OLOL-   The patient is asthmatic.   Penicillins Shortness Of Breath, Rash, Other (See Comments)      Shellfish Allergy Anaphylaxis   Sulfa Antibiotics Hives, Shortness Of Breath   Tape Dermatitis, Rash, Other (See Comments)   Melts my skin    Amoxicillin-pot Clavulanate Nausea And Vomiting   Aspirin  Other (See Comments)   Blisters on tongue from higher doses - tolerates 81 mg   Dye Fdc Red  [red Dye #2 (amaranth) (non-screening)] Hives   X ray DYE  (BENADRYL  50 MG PROPHYLAXIS AND AFTER AND  SHE DID FINE, PER PATIENT.).   Talwin [pentazocine] Other (See Comments)   Unknown reaction (pain med)   Levofloxacin  Rash, Other (See Comments)   headache        Medication List     STOP taking these medications    acetaminophen  500 MG tablet Commonly known as: TYLENOL        TAKE these medications    acyclovir  ointment 5 % Commonly known as: ZOVIRAX  Apply 1 application topically every 3 (three) hours as needed (mouth sores).   Advil 200 MG tablet Generic drug: ibuprofen Take 800 mg by mouth every 6 (six) hours as needed for headache (or pain).   albuterol  (2.5 MG/3ML) 0.083% nebulizer solution Commonly known as: PROVENTIL  Take 2.5 mg by nebulization every 4 (four) hours as needed for shortness of breath or wheezing.   albuterol  108 (90 Base) MCG/ACT inhaler Commonly known as: VENTOLIN  HFA Inhale 2 puffs into the lungs every 2 (two) hours as needed for wheezing or shortness of breath.   atorvastatin  20 MG tablet Commonly known as: LIPITOR Take 20 mg by mouth daily.   cefTRIAXone  IVPB Commonly known as: ROCEPHIN  Inject 2 g into the vein daily for 24 days. Indication:  osteomyelitis First Dose: No Last Day of Therapy:  06/12/2024 Labs - Once weekly:  CBC/D and BMP, Labs -  Once weekly: ESR and CRP Method of administration: IV Push Method of administration may be changed at the discretion of home infusion pharmacist based upon assessment of the patient and/or caregiver's ability to self-administer the medication ordered.   cephALEXin 500 MG capsule Commonly known as: KEFLEX Take 2,000 mg by mouth See admin instructions. TAKE 4 CAPSULES (2000 MG) BY MOUTH 1 HR PRIOR TO DENTAL PROCEDURE   cyanocobalamin  1000 MCG  tablet Take 1 tablet (1,000 mcg total) by mouth daily. Start taking on: June 02, 2024   daptomycin  IVPB Commonly known as: CUBICIN  Inject 600 mg into the vein daily for 24 days. Indication:  Osteomyelitis First Dose: No Last Day of Therapy:  06/12/2024 Labs - Once weekly:  CBC/D, BMP, and CPK Labs - Once weekly: ESR and CRP Method of administration: IV Push Method of administration may be changed at the discretion of home infusion pharmacist based upon assessment of the patient and/or caregiver's ability to self-administer the medication ordered.   diazepam  10 MG tablet Commonly known as: VALIUM  Take 10 mg by mouth See admin instructions. TAKE 10 MG BY MOUTH 1 HR PRIOR TO DENTAL PROCEDURE. DO NOT DRIVE OR OPERATE HEAVY MACHINERY.   DULoxetine  60 MG capsule Commonly known as: CYMBALTA  Take 60 mg by mouth 2 (two) times daily.   EPINEPHrine  0.3 mg/0.3 mL Soaj injection Commonly known as: EPI-PEN Inject 0.3 mg into the muscle as needed for anaphylaxis.   gabapentin  600 MG tablet Commonly known as: NEURONTIN  Take 1,800 mg by mouth at bedtime.   hydrOXYzine  50 MG tablet Commonly known as: ATARAX  Take 50 mg by mouth See admin instructions. Take 50 mg by mouth in the morning and at bedtime- may take an additional 50 mg once a day as needed for anxiety   Immune Globulin 10% 10 GM/100ML Soln Inject into the vein every 30 (thirty) days.   lamoTRIgine  100 MG tablet Commonly known as: LAMICTAL  Take 100 mg by mouth at bedtime.   lisinopril  10 MG tablet Commonly known as: ZESTRIL  Take 10 mg by mouth in the morning.   meloxicam  15 MG tablet Commonly known as: MOBIC  Take 15 mg by mouth at bedtime.   montelukast  10 MG tablet Commonly known as: SINGULAIR  Take 10 mg by mouth at bedtime.   morphine  30 MG 12 hr tablet Commonly known as: MS CONTIN  Take 1 tablet (30 mg total) by mouth every 12 (twelve) hours.   nystatin  powder Generic drug: nystatin  Apply 1 application  topically 3  (three) times daily as needed (blistering sores under breasts).   oxyCODONE -acetaminophen  10-325 MG tablet Commonly known as: PERCOCET Take 1 tablet by mouth every 6 (six) hours as needed for pain. Hold for sedation What changed: when to take this   OXYGEN  Inhale 3 L/min into the lungs See admin instructions. Inhale 3 L/min into the lungs when lying down   pantoprazole  40 MG tablet Commonly known as: PROTONIX  Take 40 mg by mouth at bedtime.   prazosin  5 MG capsule Commonly known as: MINIPRESS  Take 5 mg by mouth at bedtime.   promethazine  12.5 MG tablet Commonly known as: PHENERGAN  Take 25 mg by mouth every 8 (eight) hours as needed for nausea or vomiting.   QUEtiapine  300 MG tablet Commonly known as: SEROQUEL  Take 300 mg by mouth at bedtime.   tiZANidine  4 MG tablet Commonly known as: ZANAFLEX  Take 8 mg by mouth 3 (three) times daily as needed for muscle spasms.   Wixela Inhub 500-50 MCG/ACT Aepb Generic drug: fluticasone -salmeterol  Inhale 1 puff into the lungs in the morning and at bedtime.        Follow-up Information     Sophronia Ozell BROCKS, MD Follow up on 06/07/2024.   Specialty: Family Medicine Why: 10:30 for hospital follow up Contact information: [address] [place] [address] [phone]         Harden Jerona GAILS, MD Follow up in 1 week(s).   Specialty: Orthopedic Surgery Contact information: [address] [place]  [place] [address] [phone]                Discharge Exam: Deanna Yang   05/30/24 1541 05/31/24 0445 06/01/24 0549  Weight: 98.5 kg 96.3 kg 96.3 kg   General: NAD  Cardiovascular: S1, S2 present Respiratory: CTAB Abdomen: Soft, nontender, nondistended, bowel sounds present Musculoskeletal: No bilateral pedal edema noted Skin: Normal Psychiatry: Normal mood   Condition at discharge: stable  The results of significant diagnostics from this hospitalization (including imaging, microbiology, ancillary  and laboratory) are listed below for reference.   Imaging Studies: US  EKG SITE RITE Result Date: 05/30/2024 If Site Rite image not attached, placement could not be confirmed due to current cardiac rhythm.  US  EKG SITE RITE Result Date: 05/30/2024 If Site Rite image not attached, placement could not be confirmed due to current cardiac rhythm.  DG Chest Port 1 View Result Date: 05/29/2024 CLINICAL DATA:  Sepsis EXAM: PORTABLE CHEST 1 VIEW COMPARISON:  11/12/2022 FINDINGS: Single frontal view of the chest demonstrates a stable cardiac silhouette. No airspace disease, effusion, or pneumothorax. Right-sided PICC tip overlies the right subclavian vein. Postsurgical changes from cervical ACDF. IMPRESSION: 1. Right-sided PICC tip overlies right subclavian vein. 2. No acute intrathoracic process. Electronically Signed   By: Ozell Daring M.D.   On: 05/29/2024 19:12   CT Cervical Spine Wo Contrast Result Date: 05/29/2024 CLINICAL DATA:  Status post trauma. EXAM: CT CERVICAL SPINE WITHOUT CONTRAST TECHNIQUE: Multidetector CT imaging of the cervical spine was performed without intravenous contrast. Multiplanar CT image reconstructions were also generated. RADIATION DOSE REDUCTION: This exam was performed according to the departmental dose-optimization program which includes automated exposure control, adjustment of the mA and/or kV according to patient size and/or use of iterative reconstruction technique. COMPARISON:  February 22, 2024 FINDINGS: Alignment: There is mild erosive of the normal cervical spine lordosis. Skull base and vertebrae: No acute fracture. No primary bone lesion or focal pathologic process. A metallic density fusion plate and screws are seen along the anterior aspects of the C4, C5, C6 and C7 vertebral bodies. Soft tissues and spinal canal: No prevertebral fluid or swelling. No visible canal hematoma. Disc levels: Mild endplate sclerosis is present at the levels of C2-C3 and C3-C4. Marked  severity endplate sclerosis, anterior osteophyte formation and posterior bony spurring are seen at the level of C7-T1. There is anterior surgical fusion at the levels of C4-C5, C5-C6 and C6-C7. Marked severity intervertebral disc space narrowing is seen at C7-T1. Bilateral marked severity facet joint hypertrophy is noted, left greater than right. Upper chest: Negative. Other: An expansile, approximately 2.8 cm x 5.5 cm x 2.4 cm area of soft tissue attenuation (approximately 22.42 Hounsfield units) is seen within the proximal to mid left clavicle. A nondisplaced fracture deformity is seen near this region on the prior study without evidence of an associated expansile lesion. IMPRESSION: 1. No acute fracture or subluxation of the cervical spine. 2. Anterior surgical fusion at the levels of C4-C5, C5-C6 and C6-C7. 3. Marked severity  degenerative changes at the level of C7-T1. 4. Expansile area of soft tissue attenuation within the proximal to mid left clavicle which may represent a hematoma. Correlation with physical examination is recommended to determine the presence of an overlying soft tissue wound. Electronically Signed   By: Suzen Dials M.D.   On: 05/29/2024 17:16   CT Head Wo Contrast Result Date: 05/29/2024 CLINICAL DATA:  Status post trauma. EXAM: CT HEAD WITHOUT CONTRAST TECHNIQUE: Contiguous axial images were obtained from the base of the skull through the vertex without intravenous contrast. RADIATION DOSE REDUCTION: This exam was performed according to the departmental dose-optimization program which includes automated exposure control, adjustment of the mA and/or kV according to patient size and/or use of iterative reconstruction technique. COMPARISON:  February 22, 2024 FINDINGS: Brain: No evidence of acute infarction, hemorrhage, hydrocephalus, extra-axial collection or mass lesion/mass effect. Vascular: No hyperdense vessel or unexpected calcification. Skull: Normal. Negative for fracture or  focal lesion. Sinuses/Orbits: No acute finding. Other: None. IMPRESSION: No acute intracranial pathology. Electronically Signed   By: Suzen Dials M.D.   On: 05/29/2024 17:08   US  EKG SITE RITE Result Date: 05/18/2024 If Site Rite image not attached, placement could not be confirmed due to current cardiac rhythm.   Microbiology: Results for orders placed or performed during the hospital encounter of 05/29/24  Blood Culture (routine x 2)     Status: None (Preliminary result)   Collection Time: 05/29/24  4:58 PM   Specimen: BLOOD  Result Value Ref Range Status   Specimen Description BLOOD SITE NOT SPECIFIED  Final   Special Requests   Final    BOTTLES DRAWN AEROBIC AND ANAEROBIC Blood Culture adequate volume   Culture   Final    NO GROWTH 3 DAYS Performed at [facility], [address] [place], [place] [address]    Report Status PENDING  Incomplete  Blood Culture (routine x 2)     Status: None (Preliminary result)   Collection Time: 05/29/24  4:58 PM   Specimen: BLOOD  Result Value Ref Range Status   Specimen Description BLOOD SITE NOT SPECIFIED  Final   Special Requests   Final    BOTTLES DRAWN AEROBIC AND ANAEROBIC Blood Culture adequate volume   Culture   Final    NO GROWTH 3 DAYS Performed at [facility], [address] [place], [place] [address]    Report Status PENDING  Incomplete    Labs: CBC: Recent Labs  Lab 05/29/24 1658 05/30/24 0538 05/31/24 0222 06/01/24 0300  WBC 10.0 9.5 7.3 6.7  NEUTROABS 5.9  --  3.9 2.8  HGB 10.8* 10.8* 11.1* 11.6*  HCT 34.4* 35.1* 34.5* 36.9  MCV 89.8 91.9 85.6 88.5  PLT 396 370 390 318   Basic Metabolic Panel: Recent Labs  Lab 05/29/24 1658 05/30/24 0538 05/31/24 0222 05/31/24 0856 06/01/24 0300  NA 141 139 140  --  141  K 3.8 4.1 3.2*  --  4.0  CL 110 107 106  --  106  CO2 19* 20* 23  --  24  GLUCOSE 117* 115* 110*  --  129*  BUN 7* 6* 6*  --  7*  CREATININE 1.81* 1.37* 1.03*  --  1.01*  CALCIUM   7.9* 8.0* 8.2*  --  8.5*  MG  --   --   --  2.0  --    Liver Function Tests: Recent Labs  Lab 05/29/24 1658 05/30/24 0538  AST 29 32  ALT  15 17  ALKPHOS 111 108  BILITOT 0.2 0.5  PROT 6.2* 5.9*  ALBUMIN 3.2* 3.0*   CBG: No results for input(s): GLUCAP in the last 168 hours.  Discharge time spent: greater than 30 minutes.  Signed: Lebron JINNY Cage, MD [facility] 06/01/2024

## 2024-06-01 NOTE — TOC Transition Note (Addendum)
 Transition of Care [facility]) - Discharge Note   Patient Details  Name: Deanna Yang MRN: [id number] Date of Birth: 24-Nov-1961  Transition of Care [facility]) CM/SW Contact:  Marval Gell, RN Phone Number: 06/01/2024, 12:14 PM   Clinical Narrative:     Beatris lelon Query IV IG Infusion nurse. There is nothing needed from us  for her DC, Query will let the pharmacy know to follow up with ROC orders from her community doctor.  No other TOC needs at this time         Patient Goals and CMS Choice            Discharge Placement                       Discharge Plan and Services Additional resources added to the After Visit Summary for                                       Social Drivers of Health (SDOH) Interventions SDOH Screenings   Food Insecurity: Food Insecurity Present (05/30/2024)  Housing: Low Risk  (05/30/2024)  Recent Concern: Housing - High Risk (05/16/2024)  Transportation Needs: No Transportation Needs (05/30/2024)  Recent Concern: Transportation Needs - Unmet Transportation Needs (05/08/2024)   Received from [facility]  Utilities: Not At Risk (05/30/2024)  Financial Resource Strain: Medium Risk (05/08/2024)   Received from [facility]  Physical Activity: Unknown (05/08/2024)   Received from [facility]  Social Connections: Socially Isolated (05/30/2024)  Stress: Stress Concern Present (05/08/2024)   Received from [facility]  Tobacco Use: Medium Risk (05/30/2024)     Readmission Risk Interventions    05/31/2024    2:44 PM  Readmission Risk Prevention Plan  Transportation Screening Complete  PCP or Specialist Appt within 5-7 Days Complete  Home Care Screening Complete  Medication Review (RN CM) Complete

## 2024-06-01 NOTE — Plan of Care (Signed)

## 2024-06-03 ENCOUNTER — Telehealth: Payer: Self-pay

## 2024-06-03 DIAGNOSIS — J4489 Other specified chronic obstructive pulmonary disease: Secondary | ICD-10-CM | POA: Diagnosis not present

## 2024-06-03 DIAGNOSIS — Z792 Long term (current) use of antibiotics: Secondary | ICD-10-CM | POA: Diagnosis not present

## 2024-06-03 DIAGNOSIS — K760 Fatty (change of) liver, not elsewhere classified: Secondary | ICD-10-CM | POA: Diagnosis not present

## 2024-06-03 DIAGNOSIS — G629 Polyneuropathy, unspecified: Secondary | ICD-10-CM | POA: Diagnosis not present

## 2024-06-03 DIAGNOSIS — M1990 Unspecified osteoarthritis, unspecified site: Secondary | ICD-10-CM | POA: Diagnosis not present

## 2024-06-03 DIAGNOSIS — M797 Fibromyalgia: Secondary | ICD-10-CM | POA: Diagnosis not present

## 2024-06-03 DIAGNOSIS — K5903 Drug induced constipation: Secondary | ICD-10-CM | POA: Diagnosis not present

## 2024-06-03 DIAGNOSIS — Z9981 Dependence on supplemental oxygen: Secondary | ICD-10-CM | POA: Diagnosis not present

## 2024-06-03 DIAGNOSIS — Z87891 Personal history of nicotine dependence: Secondary | ICD-10-CM | POA: Diagnosis not present

## 2024-06-03 DIAGNOSIS — S42022D Displaced fracture of shaft of left clavicle, subsequent encounter for fracture with routine healing: Secondary | ICD-10-CM | POA: Diagnosis not present

## 2024-06-03 DIAGNOSIS — T847XXA Infection and inflammatory reaction due to other internal orthopedic prosthetic devices, implants and grafts, initial encounter: Secondary | ICD-10-CM | POA: Diagnosis not present

## 2024-06-03 DIAGNOSIS — Z452 Encounter for adjustment and management of vascular access device: Secondary | ICD-10-CM | POA: Diagnosis not present

## 2024-06-03 DIAGNOSIS — E440 Moderate protein-calorie malnutrition: Secondary | ICD-10-CM | POA: Diagnosis not present

## 2024-06-03 DIAGNOSIS — A419 Sepsis, unspecified organism: Secondary | ICD-10-CM | POA: Diagnosis not present

## 2024-06-03 DIAGNOSIS — G894 Chronic pain syndrome: Secondary | ICD-10-CM | POA: Diagnosis not present

## 2024-06-03 DIAGNOSIS — K219 Gastro-esophageal reflux disease without esophagitis: Secondary | ICD-10-CM | POA: Diagnosis not present

## 2024-06-03 DIAGNOSIS — I129 Hypertensive chronic kidney disease with stage 1 through stage 4 chronic kidney disease, or unspecified chronic kidney disease: Secondary | ICD-10-CM | POA: Diagnosis not present

## 2024-06-03 DIAGNOSIS — G4733 Obstructive sleep apnea (adult) (pediatric): Secondary | ICD-10-CM | POA: Diagnosis not present

## 2024-06-03 DIAGNOSIS — N1831 Chronic kidney disease, stage 3a: Secondary | ICD-10-CM | POA: Diagnosis not present

## 2024-06-03 DIAGNOSIS — M86112 Other acute osteomyelitis, left shoulder: Secondary | ICD-10-CM | POA: Diagnosis not present

## 2024-06-03 DIAGNOSIS — Z79899 Other long term (current) drug therapy: Secondary | ICD-10-CM | POA: Diagnosis not present

## 2024-06-03 LAB — CULTURE, BLOOD (ROUTINE X 2)
Culture: NO GROWTH
Culture: NO GROWTH
Special Requests: ADEQUATE
Special Requests: ADEQUATE

## 2024-06-03 NOTE — Transitions of Care (Post Inpatient/ED Visit) (Signed)
 06/03/2024  Patient ID: Deanna Yang, female   DOB: [birth year], 62 y.o.   MRN: [id number]  Contacted patient via phone and confirms that her Primary Care Provider as Ozell Reilly, MD with [facility],  She usually sees someone in [place].  Does not have a PCP with [facility].  Encouraged patient to contact insurance carrier to update records.  Richerd Fish, RN, BSN, CCM [facility], [facility] RN Care Manager Direct Dial: [phone]

## 2024-06-04 ENCOUNTER — Encounter: Admitting: Orthopedic Surgery

## 2024-06-04 ENCOUNTER — Telehealth: Payer: Self-pay | Admitting: Radiology

## 2024-06-04 ENCOUNTER — Encounter: Payer: Self-pay | Admitting: Orthopedic Surgery

## 2024-06-04 NOTE — Progress Notes (Signed)
 Office Visit Note   Patient: Deanna Yang           Date of Birth: 03/24/1961           MRN: [id number] Visit Date: 05/28/2024              Requested by: Sophronia Ozell BROCKS, MD [address] [place],  [place] [address] PCP: Sophronia Ozell BROCKS, MD  Chief Complaint  Patient presents with   Left Shoulder - Routine Post Op    05/15/24 and 05/17/24 I&D left clavicle      HPI: Patient is a 63 year old woman who is status post serial debridement for left distal clavicle osteomyelitis.  Patient is receiving IV antibiotics with a PICC line.  She states she was running a temperature up to 100.  Assessment & Plan: Visit Diagnoses:  1. Acute osteomyelitis of clavicle, left (HCC)     Plan: Reevaluate in 1 week.  No cellulitis or purulent drainage.  Follow-Up Instructions: Return in about 1 week (around 06/04/2024).   Ortho Exam  Patient is alert, oriented, no adenopathy, well-dressed, normal affect, normal respiratory effort. Examination patient has a draining seroma without purulence.  Sutures are harvested today.    Imaging: No results found. No images are attached to the encounter.  Labs: Lab Results  Component Value Date   ESRSEDRATE 13 05/16/2024   CRP <0.5 05/16/2024   REPTSTATUS 06/03/2024 FINAL 05/29/2024   REPTSTATUS 06/03/2024 FINAL 05/29/2024   GRAMSTAIN NO WBC SEEN NO ORGANISMS SEEN  05/15/2024   CULT  05/29/2024    NO GROWTH 5 DAYS Performed at [facility], [address] [place], [place] [address]    CULT  05/29/2024    NO GROWTH 5 DAYS Performed at [facility], [address] [place], [place] [address]      Lab Results  Component Value Date   ALBUMIN 3.0 (L) 05/30/2024   ALBUMIN 3.2 (L) 05/29/2024   ALBUMIN 3.1 (L) 11/12/2022    Lab Results  Component Value Date   MG 2.0 05/31/2024   MG 1.8 11/12/2022   MG 1.7 08/07/2018   Lab Results  Component Value Date   VD25OH 10.64 (L) 11/13/2022    No results found  for: PREALBUMIN    Latest Ref Rng & Units 06/01/2024    3:00 AM 05/31/2024    2:22 AM 05/30/2024    5:38 AM  CBC EXTENDED  WBC 4.0 - 10.5 K/uL 6.7  7.3  9.5   RBC 3.87 - 5.11 MIL/uL 4.17  4.03  3.82   Hemoglobin 12.0 - 15.0 g/dL 88.3  88.8  89.1   HCT 36.0 - 46.0 % 36.9  34.5  35.1   Platelets 150 - 400 K/uL 318  390  370   NEUT# 1.7 - 7.7 K/uL 2.8  3.9    Lymph# 0.7 - 4.0 K/uL 2.8  2.3       There is no height or weight on file to calculate BMI.  Orders:  No orders of the defined types were placed in this encounter.  No orders of the defined types were placed in this encounter.    Procedures: No procedures performed  Clinical Data: No additional findings.  ROS:  All other systems negative, except as noted in the HPI. Review of Systems  Objective: Vital Signs: There were no vitals taken for this visit.  Specialty Comments:  No specialty comments available.  PMFS History: Patient Active Problem List  Diagnosis Date Noted   Fall at home, initial encounter 05/29/2024   Hypotension 05/29/2024   Nonspecific chest pain 05/29/2024   Chronic hypoxic respiratory failure (HCC) 05/29/2024   Hyperlipidemia 05/29/2024   Hematoma of the left clavicle 05/29/2024   Acute kidney injury (HCC) 05/29/2024   GAD (generalized anxiety disorder) 05/29/2024   Acute osteomyelitis of left clavicle (HCC) 05/15/2024   Hardware complicating wound infection (HCC) 05/15/2024   Infection 05/15/2024   Displaced fracture of shaft of left clavicle, initial encounter for closed fracture 03/12/2024   Closed fracture of right fibula and tibia 11/13/2022   Traumatic closed displaced fracture of right tibial plafond with fibula 11/12/2022   Depression 11/12/2022   Chronic pain syndrome 11/12/2022   Stage 3a chronic kidney disease (HCC)    Sinus tachycardia 08/07/2018   Constipation due to pain medication therapy 08/06/2018   Paralytic ileus (HCC) 07/24/2018   Acute kidney injury superimposed  on stage 3a chronic kidney disease (HCC)    Fatty liver 07/23/2018   Malnutrition of moderate degree 07/21/2018   Hypertensive urgency 07/20/2018   Nausea and vomiting 07/20/2018   ARF (acute renal failure) (HCC) 07/19/2018   Vocal cord dysfunction 04/25/2017   PTSD (post-traumatic stress disorder) 04/25/2017   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   Acute respiratory failure with hypoxia (HCC) 04/22/2017   Hypokalemia 04/22/2017   Leukocytosis 04/22/2017   QT prolongation 04/22/2017   CFIDS (chronic fatigue and immune dysfunction syndrome) (HCC) 08/20/2015   Foot pain 08/20/2015   Essential hypertension 08/20/2015   COPD (chronic obstructive pulmonary disease) (HCC) 08/20/2015   Obstructive apnea 08/20/2015   Cervical osteoarthritis 08/20/2015   Sleep disorder 08/20/2015   Other specified postprocedural states 07/17/2015   Borderline personality disorder (HCC) 06/11/2015   Past Medical History:  Diagnosis Date   Anxiety    Arthritis    Asthma    Chronic kidney disease    stage 2   COPD (chronic obstructive pulmonary disease) (HCC)    Depression    Dyspnea    On 3L oxygen  when laying down   Fatty liver 07/23/2018   Fibromyalgia    GERD (gastroesophageal reflux disease)    Headache    HSV infection    Hypertension    Manic depression (HCC)    Neuropathy    hands   OSA (obstructive sleep apnea)    does not use CPAP   Pneumonia    couple of times    Family History  Problem Relation Age of Onset   Alcohol abuse Mother    Alcohol abuse Father    Depression Sister    Dementia Neg Hx     Past Surgical History:  Procedure Laterality Date   ABDOMINAL SURGERY     APPENDECTOMY     BACK SURGERY     BREAST REDUCTION SURGERY     with lift   CARPAL TUNNEL RELEASE Bilateral    CESAREAN SECTION     CHOLECYSTECTOMY     COLONOSCOPY WITH PROPOFOL      FLEXOR TENOTOMY  Right 04/07/2022   Procedure: BRACHIORADIALIS RELEASE;  Surgeon: Murrell Drivers, MD;  Location: [facility]  OR;  Service: Orthopedics;  Laterality: Right;   HERNIA REPAIR     JOINT REPLACEMENT Left    knee   NECK SURGERY     with rod   OPEN REDUCTION INTERNAL FIXATION (ORIF) DISTAL RADIAL FRACTURE Right 04/07/2022   Procedure: OPEN REDUCTION INTERNAL FIXATION (ORIF) RIGHT DISTAL RADIUS FRACTURE;  Surgeon: Murrell Drivers, MD;  Location:  [facility] OR;  Service: Orthopedics;  Laterality: Right;  75 MIN   ORIF CLAVICULAR FRACTURE Left 03/12/2024   Procedure: OPEN REDUCTION INTERNAL FIXATION (ORIF) CLAVICULAR FRACTURE;  Surgeon: Beverley Evalene BIRCH, MD;  Location: [facility];  Service: Orthopedics;  Laterality: Left;   ORIF CLAVICULAR FRACTURE Left 04/09/2024   Procedure: OPEN REDUCTION INTERNAL FIXATION (ORIF) CLAVICULAR FRACTURE REVISION WITH REMOVAL OF PREVIOUS HARDWARE;  Surgeon: Beverley Evalene BIRCH, MD;  Location: [facility] OR;  Service: Orthopedics;  Laterality: Left;   RESECTION DISTAL CLAVICAL Left 05/15/2024   Procedure: EXCISION, CLAVICLE, DISTAL, OPEN;  Surgeon: Harden Jerona GAILS, MD;  Location: [facility] OR;  Service: Orthopedics;  Laterality: Left;  LEFT CLAVICLE DEBRIDEMENT AND HARDWARE REMOVAL   RESECTION DISTAL CLAVICAL Left 05/17/2024   Procedure: Incision and Drainage Left Clavicle;  Surgeon: Harden Jerona GAILS, MD;  Location: [facility] OR;  Service: Orthopedics;  Laterality: Left;  LEFT CLAVICLE DEBRIDEMENT   TIBIA IM NAIL INSERTION Right 11/12/2022   Procedure: INTRAMEDULLARY (IM) NAIL TIBIAL;  Surgeon: Barton Drape, MD;  Location: [facility] OR;  Service: Orthopedics;  Laterality: Right;   TONSILLECTOMY     TOOTH EXTRACTION     with sedation   UPPER GI ENDOSCOPY     Social History   Occupational History   Not on file  Tobacco Use   Smoking status: Former    Current packs/day: 0.00    Types: Cigarettes    Quit date: 01/17/2017    Years since quitting: 7.3   Smokeless tobacco: Never  Vaping Use   Vaping status: Never Used  Substance and Sexual Activity   Alcohol use: No    Comment: last use 2-3 years ago   Drug use: No    Sexual activity: Not Currently    Birth control/protection: Post-menopausal

## 2024-06-04 NOTE — Telephone Encounter (Signed)
 Benton, nurse from [facility] called about patient.   States she seen patient yesterday for a follow up from hospitalization.  Patient attempted to remove PICC Line herself with SOB, chest pain. She was admitted to [facility] with AKI. Discharged with new PICC line.   At visit yesterday labs were draws and sent to [facility], education was given.  She had different would care instructions - change dressings daily, soap/water , wound cleanser, secure with gauze and tape.  Benton states they will check with patient once a week, PICC line dressing change, labs, wound check/dressing change.   CB# [phone]

## 2024-06-04 NOTE — Telephone Encounter (Signed)
 Pt has a follow up post op appt with us  today. We will follow up and then call after her appt if any new orders needed.

## 2024-06-05 DIAGNOSIS — N1831 Chronic kidney disease, stage 3a: Secondary | ICD-10-CM | POA: Diagnosis not present

## 2024-06-05 DIAGNOSIS — Z792 Long term (current) use of antibiotics: Secondary | ICD-10-CM | POA: Diagnosis not present

## 2024-06-05 DIAGNOSIS — S42022D Displaced fracture of shaft of left clavicle, subsequent encounter for fracture with routine healing: Secondary | ICD-10-CM | POA: Diagnosis not present

## 2024-06-05 DIAGNOSIS — Z9981 Dependence on supplemental oxygen: Secondary | ICD-10-CM | POA: Diagnosis not present

## 2024-06-05 DIAGNOSIS — Z87891 Personal history of nicotine dependence: Secondary | ICD-10-CM | POA: Diagnosis not present

## 2024-06-05 DIAGNOSIS — Z79899 Other long term (current) drug therapy: Secondary | ICD-10-CM | POA: Diagnosis not present

## 2024-06-05 DIAGNOSIS — K760 Fatty (change of) liver, not elsewhere classified: Secondary | ICD-10-CM | POA: Diagnosis not present

## 2024-06-05 DIAGNOSIS — G894 Chronic pain syndrome: Secondary | ICD-10-CM | POA: Diagnosis not present

## 2024-06-05 DIAGNOSIS — I129 Hypertensive chronic kidney disease with stage 1 through stage 4 chronic kidney disease, or unspecified chronic kidney disease: Secondary | ICD-10-CM | POA: Diagnosis not present

## 2024-06-05 DIAGNOSIS — G4733 Obstructive sleep apnea (adult) (pediatric): Secondary | ICD-10-CM | POA: Diagnosis not present

## 2024-06-05 DIAGNOSIS — Z452 Encounter for adjustment and management of vascular access device: Secondary | ICD-10-CM | POA: Diagnosis not present

## 2024-06-05 DIAGNOSIS — M86112 Other acute osteomyelitis, left shoulder: Secondary | ICD-10-CM | POA: Diagnosis not present

## 2024-06-05 DIAGNOSIS — K5903 Drug induced constipation: Secondary | ICD-10-CM | POA: Diagnosis not present

## 2024-06-05 DIAGNOSIS — M1990 Unspecified osteoarthritis, unspecified site: Secondary | ICD-10-CM | POA: Diagnosis not present

## 2024-06-05 DIAGNOSIS — E440 Moderate protein-calorie malnutrition: Secondary | ICD-10-CM | POA: Diagnosis not present

## 2024-06-05 DIAGNOSIS — T847XXA Infection and inflammatory reaction due to other internal orthopedic prosthetic devices, implants and grafts, initial encounter: Secondary | ICD-10-CM | POA: Diagnosis not present

## 2024-06-05 DIAGNOSIS — M797 Fibromyalgia: Secondary | ICD-10-CM | POA: Diagnosis not present

## 2024-06-05 DIAGNOSIS — K219 Gastro-esophageal reflux disease without esophagitis: Secondary | ICD-10-CM | POA: Diagnosis not present

## 2024-06-05 DIAGNOSIS — A419 Sepsis, unspecified organism: Secondary | ICD-10-CM | POA: Diagnosis not present

## 2024-06-05 DIAGNOSIS — G629 Polyneuropathy, unspecified: Secondary | ICD-10-CM | POA: Diagnosis not present

## 2024-06-05 DIAGNOSIS — J4489 Other specified chronic obstructive pulmonary disease: Secondary | ICD-10-CM | POA: Diagnosis not present

## 2024-06-06 DIAGNOSIS — R0600 Dyspnea, unspecified: Secondary | ICD-10-CM | POA: Diagnosis not present

## 2024-06-06 DIAGNOSIS — J449 Chronic obstructive pulmonary disease, unspecified: Secondary | ICD-10-CM | POA: Diagnosis not present

## 2024-06-06 DIAGNOSIS — I10 Essential (primary) hypertension: Secondary | ICD-10-CM | POA: Diagnosis not present

## 2024-06-06 DIAGNOSIS — J301 Allergic rhinitis due to pollen: Secondary | ICD-10-CM | POA: Diagnosis not present

## 2024-06-06 DIAGNOSIS — D839 Common variable immunodeficiency, unspecified: Secondary | ICD-10-CM | POA: Diagnosis not present

## 2024-06-06 DIAGNOSIS — R079 Chest pain, unspecified: Secondary | ICD-10-CM | POA: Diagnosis not present

## 2024-06-06 DIAGNOSIS — R918 Other nonspecific abnormal finding of lung field: Secondary | ICD-10-CM | POA: Diagnosis not present

## 2024-06-06 DIAGNOSIS — R053 Chronic cough: Secondary | ICD-10-CM | POA: Diagnosis not present

## 2024-06-06 DIAGNOSIS — J4540 Moderate persistent asthma, uncomplicated: Secondary | ICD-10-CM | POA: Diagnosis not present

## 2024-06-08 DIAGNOSIS — T847XXA Infection and inflammatory reaction due to other internal orthopedic prosthetic devices, implants and grafts, initial encounter: Secondary | ICD-10-CM | POA: Diagnosis not present

## 2024-06-08 DIAGNOSIS — M86112 Other acute osteomyelitis, left shoulder: Secondary | ICD-10-CM | POA: Diagnosis not present

## 2024-06-10 ENCOUNTER — Telehealth: Payer: Self-pay | Admitting: Orthopedic Surgery

## 2024-06-10 DIAGNOSIS — K5903 Drug induced constipation: Secondary | ICD-10-CM | POA: Diagnosis not present

## 2024-06-10 DIAGNOSIS — Z9981 Dependence on supplemental oxygen: Secondary | ICD-10-CM | POA: Diagnosis not present

## 2024-06-10 DIAGNOSIS — G4733 Obstructive sleep apnea (adult) (pediatric): Secondary | ICD-10-CM | POA: Diagnosis not present

## 2024-06-10 DIAGNOSIS — J4489 Other specified chronic obstructive pulmonary disease: Secondary | ICD-10-CM | POA: Diagnosis not present

## 2024-06-10 DIAGNOSIS — Z79899 Other long term (current) drug therapy: Secondary | ICD-10-CM | POA: Diagnosis not present

## 2024-06-10 DIAGNOSIS — Z792 Long term (current) use of antibiotics: Secondary | ICD-10-CM | POA: Diagnosis not present

## 2024-06-10 DIAGNOSIS — M86112 Other acute osteomyelitis, left shoulder: Secondary | ICD-10-CM | POA: Diagnosis not present

## 2024-06-10 DIAGNOSIS — E440 Moderate protein-calorie malnutrition: Secondary | ICD-10-CM | POA: Diagnosis not present

## 2024-06-10 DIAGNOSIS — T847XXA Infection and inflammatory reaction due to other internal orthopedic prosthetic devices, implants and grafts, initial encounter: Secondary | ICD-10-CM | POA: Diagnosis not present

## 2024-06-10 DIAGNOSIS — I129 Hypertensive chronic kidney disease with stage 1 through stage 4 chronic kidney disease, or unspecified chronic kidney disease: Secondary | ICD-10-CM | POA: Diagnosis not present

## 2024-06-10 DIAGNOSIS — Z87891 Personal history of nicotine dependence: Secondary | ICD-10-CM | POA: Diagnosis not present

## 2024-06-10 DIAGNOSIS — K760 Fatty (change of) liver, not elsewhere classified: Secondary | ICD-10-CM | POA: Diagnosis not present

## 2024-06-10 DIAGNOSIS — G894 Chronic pain syndrome: Secondary | ICD-10-CM | POA: Diagnosis not present

## 2024-06-10 DIAGNOSIS — M797 Fibromyalgia: Secondary | ICD-10-CM | POA: Diagnosis not present

## 2024-06-10 DIAGNOSIS — N1831 Chronic kidney disease, stage 3a: Secondary | ICD-10-CM | POA: Diagnosis not present

## 2024-06-10 DIAGNOSIS — S42022D Displaced fracture of shaft of left clavicle, subsequent encounter for fracture with routine healing: Secondary | ICD-10-CM | POA: Diagnosis not present

## 2024-06-10 DIAGNOSIS — K219 Gastro-esophageal reflux disease without esophagitis: Secondary | ICD-10-CM | POA: Diagnosis not present

## 2024-06-10 DIAGNOSIS — A419 Sepsis, unspecified organism: Secondary | ICD-10-CM | POA: Diagnosis not present

## 2024-06-10 DIAGNOSIS — M1990 Unspecified osteoarthritis, unspecified site: Secondary | ICD-10-CM | POA: Diagnosis not present

## 2024-06-10 DIAGNOSIS — Z452 Encounter for adjustment and management of vascular access device: Secondary | ICD-10-CM | POA: Diagnosis not present

## 2024-06-10 DIAGNOSIS — G629 Polyneuropathy, unspecified: Secondary | ICD-10-CM | POA: Diagnosis not present

## 2024-06-10 NOTE — Telephone Encounter (Signed)
 Called pt and appt made for 9:30 tomorrow.

## 2024-06-10 NOTE — Telephone Encounter (Signed)
 Patient called and needs to schedule for post op. I gave her a date and she said she was told from [place] don't take any more medication after the 2nd and her sutures needs to be removed. CB#[phone]

## 2024-06-10 NOTE — Telephone Encounter (Signed)
I am not sure what the question is?

## 2024-06-11 ENCOUNTER — Encounter: Payer: Self-pay | Admitting: Orthopedic Surgery

## 2024-06-11 ENCOUNTER — Ambulatory Visit (INDEPENDENT_AMBULATORY_CARE_PROVIDER_SITE_OTHER): Admitting: Orthopedic Surgery

## 2024-06-11 DIAGNOSIS — M86112 Other acute osteomyelitis, left shoulder: Secondary | ICD-10-CM

## 2024-06-11 NOTE — Progress Notes (Signed)
 Office Visit Note   Patient: Deanna Yang           Date of Birth: 1961-04-02           MRN: [id number] Visit Date: 06/11/2024              Requested by: Sophronia Ozell BROCKS, MD [address] [place],  [place] [address] PCP: Sophronia Ozell BROCKS, MD  Chief Complaint  Patient presents with   Left Shoulder - Routine Post Op    05/15/24 and 05/17/24 I&D left clavicle      HPI: Patient is a 63 year old woman who is 3 weeks status post 2 surgical debridements for osteomyelitis of the left clavicle.  She has been on a PICC line.  Patient states that her PICC line is supposed to be removed tomorrow.  Assessment & Plan: Visit Diagnoses:  1. Acute osteomyelitis of clavicle, left (HCC)     Plan: The PICC line was removed the sutures were removed.  Patient will continue with Dial soap cleansing and scar massage.  Follow-Up Instructions: Return in about 4 weeks (around 07/09/2024).   Ortho Exam  Patient is alert, oriented, no adenopathy, well-dressed, normal affect, normal respiratory effort. Examination of the left clavicle incision there is 1 area of wound dehiscence that is 3 mm x 10 mm.  There is some mild cellulitis around this area the remainder the incision is well-healed and well-approximated.  Patient requested to have her PICC line removed today.  The PICC line was removed without complication manual compression was held and a compression bolus dressing was applied.    Imaging: No results found.   Labs: Lab Results  Component Value Date   ESRSEDRATE 13 05/16/2024   CRP <0.5 05/16/2024   REPTSTATUS 06/03/2024 FINAL 05/29/2024   REPTSTATUS 06/03/2024 FINAL 05/29/2024   GRAMSTAIN NO WBC SEEN NO ORGANISMS SEEN  05/15/2024   CULT  05/29/2024    NO GROWTH 5 DAYS Performed at [facility], [address], [place], [place] [address]    CULT  05/29/2024    NO GROWTH 5 DAYS Performed at [facility], [address] [place], [place] [address]       Lab Results  Component Value Date   ALBUMIN 3.0 (L) 05/30/2024   ALBUMIN 3.2 (L) 05/29/2024   ALBUMIN 3.1 (L) 11/12/2022    Lab Results  Component Value Date   MG 2.0 05/31/2024   MG 1.8 11/12/2022   MG 1.7 08/07/2018   Lab Results  Component Value Date   VD25OH 10.64 (L) 11/13/2022    No results found for: PREALBUMIN    Latest Ref Rng & Units 06/01/2024    3:00 AM 05/31/2024    2:22 AM 05/30/2024    5:38 AM  CBC EXTENDED  WBC 4.0 - 10.5 K/uL 6.7  7.3  9.5   RBC 3.87 - 5.11 MIL/uL 4.17  4.03  3.82   Hemoglobin 12.0 - 15.0 g/dL 88.3  88.8  89.1   HCT 36.0 - 46.0 % 36.9  34.5  35.1   Platelets 150 - 400 K/uL 318  390  370   NEUT# 1.7 - 7.7 K/uL 2.8  3.9    Lymph# 0.7 - 4.0 K/uL 2.8  2.3       There is no height or weight on file to calculate BMI.  Orders:  No orders of the defined types were placed in this encounter.  No orders of the defined types were  placed in this encounter.    Procedures: No procedures performed  Clinical Data: No additional findings.  ROS:  All other systems negative, except as noted in the HPI. Review of Systems  Objective: Vital Signs: There were no vitals taken for this visit.  Specialty Comments:  No specialty comments available.  PMFS History: Patient Active Problem List   Diagnosis Date Noted   Fall at home, initial encounter 05/29/2024   Hypotension 05/29/2024   Nonspecific chest pain 05/29/2024   Chronic hypoxic respiratory failure (HCC) 05/29/2024   Hyperlipidemia 05/29/2024   Hematoma of the left clavicle 05/29/2024   Acute kidney injury (HCC) 05/29/2024   GAD (generalized anxiety disorder) 05/29/2024   Acute osteomyelitis of left clavicle (HCC) 05/15/2024   Hardware complicating wound infection (HCC) 05/15/2024   Infection 05/15/2024   Displaced fracture of shaft of left clavicle, initial encounter for closed fracture 03/12/2024   Closed fracture of right fibula and tibia 11/13/2022   Traumatic closed  displaced fracture of right tibial plafond with fibula 11/12/2022   Depression 11/12/2022   Chronic pain syndrome 11/12/2022   Stage 3a chronic kidney disease (HCC)    Sinus tachycardia 08/07/2018   Constipation due to pain medication therapy 08/06/2018   Paralytic ileus (HCC) 07/24/2018   Acute kidney injury superimposed on stage 3a chronic kidney disease (HCC)    Fatty liver 07/23/2018   Malnutrition of moderate degree 07/21/2018   Hypertensive urgency 07/20/2018   Nausea and vomiting 07/20/2018   ARF (acute renal failure) (HCC) 07/19/2018   Vocal cord dysfunction 04/25/2017   PTSD (post-traumatic stress disorder) 04/25/2017   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   Acute respiratory failure with hypoxia (HCC) 04/22/2017   Hypokalemia 04/22/2017   Leukocytosis 04/22/2017   QT prolongation 04/22/2017   CFIDS (chronic fatigue and immune dysfunction syndrome) (HCC) 08/20/2015   Foot pain 08/20/2015   Essential hypertension 08/20/2015   COPD (chronic obstructive pulmonary disease) (HCC) 08/20/2015   Obstructive apnea 08/20/2015   Cervical osteoarthritis 08/20/2015   Sleep disorder 08/20/2015   Other specified postprocedural states 07/17/2015   Borderline personality disorder (HCC) 06/11/2015   Past Medical History:  Diagnosis Date   Anxiety    Arthritis    Asthma    Chronic kidney disease    stage 2   COPD (chronic obstructive pulmonary disease) (HCC)    Depression    Dyspnea    On 3L oxygen  when laying down   Fatty liver 07/23/2018   Fibromyalgia    GERD (gastroesophageal reflux disease)    Headache    HSV infection    Hypertension    Manic depression (HCC)    Neuropathy    hands   OSA (obstructive sleep apnea)    does not use CPAP   Pneumonia    couple of times    Family History  Problem Relation Age of Onset   Alcohol abuse Mother    Alcohol abuse Father    Depression Sister    Dementia Neg Hx     Past Surgical History:  Procedure Laterality Date    ABDOMINAL SURGERY     APPENDECTOMY     BACK SURGERY     BREAST REDUCTION SURGERY     with lift   CARPAL TUNNEL RELEASE Bilateral    CESAREAN SECTION     CHOLECYSTECTOMY     COLONOSCOPY WITH PROPOFOL      FLEXOR TENOTOMY  Right 04/07/2022   Procedure: BRACHIORADIALIS RELEASE;  Surgeon: Murrell Drivers, MD;  Location: [facility] OR;  Service: Orthopedics;  Laterality: Right;   HERNIA REPAIR     JOINT REPLACEMENT Left    knee   NECK SURGERY     with rod   OPEN REDUCTION INTERNAL FIXATION (ORIF) DISTAL RADIAL FRACTURE Right 04/07/2022   Procedure: OPEN REDUCTION INTERNAL FIXATION (ORIF) RIGHT DISTAL RADIUS FRACTURE;  Surgeon: Murrell Drivers, MD;  Location: [facility];  Service: Orthopedics;  Laterality: Right;  75 MIN   ORIF CLAVICULAR FRACTURE Left 03/12/2024   Procedure: OPEN REDUCTION INTERNAL FIXATION (ORIF) CLAVICULAR FRACTURE;  Surgeon: Beverley Evalene BIRCH, MD;  Location: [facility];  Service: Orthopedics;  Laterality: Left;   ORIF CLAVICULAR FRACTURE Left 04/09/2024   Procedure: OPEN REDUCTION INTERNAL FIXATION (ORIF) CLAVICULAR FRACTURE REVISION WITH REMOVAL OF PREVIOUS HARDWARE;  Surgeon: Beverley Evalene BIRCH, MD;  Location: [facility];  Service: Orthopedics;  Laterality: Left;   RESECTION DISTAL CLAVICAL Left 05/15/2024   Procedure: EXCISION, CLAVICLE, DISTAL, OPEN;  Surgeon: Harden Jerona GAILS, MD;  Location: [facility];  Service: Orthopedics;  Laterality: Left;  LEFT CLAVICLE DEBRIDEMENT AND HARDWARE REMOVAL   RESECTION DISTAL CLAVICAL Left 05/17/2024   Procedure: Incision and Drainage Left Clavicle;  Surgeon: Harden Jerona GAILS, MD;  Location: [facility] OR;  Service: Orthopedics;  Laterality: Left;  LEFT CLAVICLE DEBRIDEMENT   TIBIA IM NAIL INSERTION Right 11/12/2022   Procedure: INTRAMEDULLARY (IM) NAIL TIBIAL;  Surgeon: Barton Drape, MD;  Location: [facility] OR;  Service: Orthopedics;  Laterality: Right;   TONSILLECTOMY     TOOTH EXTRACTION     with sedation   UPPER GI ENDOSCOPY     Social History   Occupational History   Not on  file  Tobacco Use   Smoking status: Former    Current packs/day: 0.00    Types: Cigarettes    Quit date: 01/17/2017    Years since quitting: 7.4   Smokeless tobacco: Never  Vaping Use   Vaping status: Never Used  Substance and Sexual Activity   Alcohol use: No    Comment: last use 2-3 years ago   Drug use: No   Sexual activity: Not Currently    Birth control/protection: Post-menopausal

## 2024-06-11 NOTE — Progress Notes (Deleted)
 Subjective:   Patient ID: Deanna Yang, female    DOB: 12/25/1960, 63 y.o.   MRN: [id number]  No chief complaint on file.   HPI:  Deanna Yang is a 63 y.o. female with left clavicular osteomyelitis with hardware removal last seen by Dr. Luiz on 05/18/24 s/p debridement with negative cultures and plan for 4 weeks of daptomycin  and ceftriaxone  through 06/12/24 with transition to oral antibiotics at the completion of IV portion of treatment. Seen by Dr. Harden on 06/11/24 with small area of dehiscence with remainder of incision well healed and well approximated. There was some mild cellulitis around the site. Sutures and PICC line removed. Here today for follow up.    Allergies  Allergen Reactions   Ambien [Zolpidem Tartrate] Shortness Of Breath, Swelling and Other (See Comments)    Tongue swelling, throat swelling.    Beta Adrenergic Blockers Anaphylaxis   Fish Allergy Shortness Of Breath and Rash   Iodinated Contrast Media Itching, Swelling and Other (See Comments)    Benadryl  50MG  prophylaxis before and after and patient reports she did fine. Face became swollen.   Latex Itching and Other (See Comments)    Blisters skin   Metoprolol  Other (See Comments)    ANY MEDICATION THAT ENDS IN -OLOL-   The patient is asthmatic.   Penicillins Shortness Of Breath, Rash and Other (See Comments)        Shellfish Allergy Anaphylaxis   Sulfa Antibiotics Hives and Shortness Of Breath   Tape Dermatitis, Rash and Other (See Comments)    Melts my skin    Amoxicillin-Pot Clavulanate Nausea And Vomiting   Aspirin  Other (See Comments)    Blisters on tongue from higher doses - tolerates 81 mg    Dye Fdc Red  [Red Dye #2 (Amaranth) (Non-Screening)] Hives    X ray DYE  (BENADRYL  50 MG PROPHYLAXIS AND AFTER AND  SHE DID FINE, PER PATIENT.).   Talwin [Pentazocine] Other (See Comments)    Unknown reaction (pain med)   Levofloxacin  Rash and Other (See Comments)    headache      Outpatient  Medications Prior to Visit  Medication Sig Dispense Refill   acyclovir  ointment (ZOVIRAX ) 5 % Apply 1 application topically every 3 (three) hours as needed (mouth sores).   3   albuterol  (PROVENTIL ) (2.5 MG/3ML) 0.083% nebulizer solution Take 2.5 mg by nebulization every 4 (four) hours as needed for shortness of breath or wheezing.     albuterol  (VENTOLIN  HFA) 108 (90 Base) MCG/ACT inhaler Inhale 2 puffs into the lungs every 2 (two) hours as needed for wheezing or shortness of breath.     atorvastatin  (LIPITOR) 20 MG tablet Take 20 mg by mouth daily.     cefTRIAXone  (ROCEPHIN ) IVPB Inject 2 g into the vein daily for 24 days. Indication:  osteomyelitis First Dose: No Last Day of Therapy:  06/12/2024 Labs - Once weekly:  CBC/D and BMP, Labs - Once weekly: ESR and CRP Method of administration: IV Push Method of administration may be changed at the discretion of home infusion pharmacist based upon assessment of the patient and/or caregiver's ability to self-administer the medication ordered. 24 Units 0   cephALEXin (KEFLEX) 500 MG capsule Take 2,000 mg by mouth See admin instructions. TAKE 4 CAPSULES (2000 MG) BY MOUTH 1 HR PRIOR TO DENTAL PROCEDURE     cyanocobalamin  1000 MCG tablet Take 1 tablet (1,000 mcg total) by mouth daily. 30 tablet 0   daptomycin  (CUBICIN ) IVPB Inject 600  mg into the vein daily for 24 days. Indication:  Osteomyelitis First Dose: No Last Day of Therapy:  06/12/2024 Labs - Once weekly:  CBC/D, BMP, and CPK Labs - Once weekly: ESR and CRP Method of administration: IV Push Method of administration may be changed at the discretion of home infusion pharmacist based upon assessment of the patient and/or caregiver's ability to self-administer the medication ordered. 24 Units 0   diazepam  (VALIUM ) 10 MG tablet Take 10 mg by mouth See admin instructions. TAKE 10 MG BY MOUTH 1 HR PRIOR TO DENTAL PROCEDURE. DO NOT DRIVE OR OPERATE HEAVY MACHINERY.     DULoxetine  (CYMBALTA ) 60 MG capsule  Take 60 mg by mouth 2 (two) times daily.     EPINEPHrine  0.3 mg/0.3 mL IJ SOAJ injection Inject 0.3 mg into the muscle as needed for anaphylaxis.     gabapentin  (NEURONTIN ) 600 MG tablet Take 1,800 mg by mouth at bedtime.     hydrOXYzine  (ATARAX ) 50 MG tablet Take 50 mg by mouth See admin instructions. Take 50 mg by mouth in the morning and at bedtime- may take an additional 50 mg once a day as needed for anxiety     ibuprofen (ADVIL) 200 MG tablet Take 800 mg by mouth every 6 (six) hours as needed for headache (or pain).     Immune Globulin 10% (GAMMAGARD) 10 GM/100ML SOLN Inject into the vein every 30 (thirty) days.     lamoTRIgine  (LAMICTAL ) 100 MG tablet Take 100 mg by mouth at bedtime.     lisinopril  (ZESTRIL ) 10 MG tablet Take 10 mg by mouth in the morning.     meloxicam  (MOBIC ) 15 MG tablet Take 15 mg by mouth at bedtime.     montelukast  (SINGULAIR ) 10 MG tablet Take 10 mg by mouth at bedtime.     morphine  (MS CONTIN ) 30 MG 12 hr tablet Take 1 tablet (30 mg total) by mouth every 12 (twelve) hours. 10 tablet 0   NYSTATIN  powder Apply 1 application  topically 3 (three) times daily as needed (blistering sores under breasts).  3   oxyCODONE -acetaminophen  (PERCOCET) 10-325 MG tablet Take 1 tablet by mouth every 6 (six) hours as needed for pain. Hold for sedation (Patient taking differently: Take 1 tablet by mouth 4 (four) times daily. Hold for sedation) 30 tablet 0   OXYGEN  Inhale 3 L/min into the lungs See admin instructions. Inhale 3 L/min into the lungs when lying down     pantoprazole  (PROTONIX ) 40 MG tablet Take 40 mg by mouth at bedtime.     prazosin  (MINIPRESS ) 5 MG capsule Take 5 mg by mouth at bedtime.  1   promethazine  (PHENERGAN ) 12.5 MG tablet Take 25 mg by mouth every 8 (eight) hours as needed for nausea or vomiting.     QUEtiapine  (SEROQUEL ) 300 MG tablet Take 300 mg by mouth at bedtime.     tiZANidine  (ZANAFLEX ) 4 MG tablet Take 8 mg by mouth 3 (three) times daily as needed for  muscle spasms.     WIXELA INHUB 500-50 MCG/ACT AEPB Inhale 1 puff into the lungs in the morning and at bedtime.     No facility-administered medications prior to visit.     Past Medical History:  Diagnosis Date   Anxiety    Arthritis    Asthma    Chronic kidney disease    stage 2   COPD (chronic obstructive pulmonary disease) (HCC)    Depression    Dyspnea    On 3L oxygen   when laying down   Fatty liver 07/23/2018   Fibromyalgia    GERD (gastroesophageal reflux disease)    Headache    HSV infection    Hypertension    Manic depression (HCC)    Neuropathy    hands   OSA (obstructive sleep apnea)    does not use CPAP   Pneumonia    couple of times     Past Surgical History:  Procedure Laterality Date   ABDOMINAL SURGERY     APPENDECTOMY     BACK SURGERY     BREAST REDUCTION SURGERY     with lift   CARPAL TUNNEL RELEASE Bilateral    CESAREAN SECTION     CHOLECYSTECTOMY     COLONOSCOPY WITH PROPOFOL      FLEXOR TENOTOMY  Right 04/07/2022   Procedure: BRACHIORADIALIS RELEASE;  Surgeon: Murrell Drivers, MD;  Location: [facility] OR;  Service: Orthopedics;  Laterality: Right;   HERNIA REPAIR     JOINT REPLACEMENT Left    knee   NECK SURGERY     with rod   OPEN REDUCTION INTERNAL FIXATION (ORIF) DISTAL RADIAL FRACTURE Right 04/07/2022   Procedure: OPEN REDUCTION INTERNAL FIXATION (ORIF) RIGHT DISTAL RADIUS FRACTURE;  Surgeon: Murrell Drivers, MD;  Location: [facility] OR;  Service: Orthopedics;  Laterality: Right;  75 MIN   ORIF CLAVICULAR FRACTURE Left 03/12/2024   Procedure: OPEN REDUCTION INTERNAL FIXATION (ORIF) CLAVICULAR FRACTURE;  Surgeon: Beverley Evalene BIRCH, MD;  Location: [facility];  Service: Orthopedics;  Laterality: Left;   ORIF CLAVICULAR FRACTURE Left 04/09/2024   Procedure: OPEN REDUCTION INTERNAL FIXATION (ORIF) CLAVICULAR FRACTURE REVISION WITH REMOVAL OF PREVIOUS HARDWARE;  Surgeon: Beverley Evalene BIRCH, MD;  Location: [facility] OR;  Service: Orthopedics;  Laterality: Left;   RESECTION  DISTAL CLAVICAL Left 05/15/2024   Procedure: EXCISION, CLAVICLE, DISTAL, OPEN;  Surgeon: Harden Jerona GAILS, MD;  Location: [facility] OR;  Service: Orthopedics;  Laterality: Left;  LEFT CLAVICLE DEBRIDEMENT AND HARDWARE REMOVAL   RESECTION DISTAL CLAVICAL Left 05/17/2024   Procedure: Incision and Drainage Left Clavicle;  Surgeon: Harden Jerona GAILS, MD;  Location: [facility] OR;  Service: Orthopedics;  Laterality: Left;  LEFT CLAVICLE DEBRIDEMENT   TIBIA IM NAIL INSERTION Right 11/12/2022   Procedure: INTRAMEDULLARY (IM) NAIL TIBIAL;  Surgeon: Barton Drape, MD;  Location: [facility] OR;  Service: Orthopedics;  Laterality: Right;   TONSILLECTOMY     TOOTH EXTRACTION     with sedation   UPPER GI ENDOSCOPY         Review of Systems  Objective:   There were no vitals taken for this visit. Nursing note and vital signs reviewed.  Physical Exam      01/10/2017    2:17 PM  Depression screen PHQ 2/9  Decreased Interest   Down, Depressed, Hopeless   PHQ - 2 Score   Altered sleeping   Tired, decreased energy   Change in appetite   Feeling bad or failure about yourself    Trouble concentrating   Moving slowly or fidgety/restless   Suicidal thoughts   PHQ-9 Score   Difficult doing work/chores      Information is confidential and restricted. Go to Review Flowsheets to unlock data.     Assessment & Plan:    Patient Active Problem List   Diagnosis Date Noted   Fall at home, initial encounter 05/29/2024   Hypotension 05/29/2024   Nonspecific chest pain 05/29/2024   Chronic hypoxic respiratory failure (HCC) 05/29/2024   Hyperlipidemia 05/29/2024   Hematoma of the left  clavicle 05/29/2024   Acute kidney injury (HCC) 05/29/2024   GAD (generalized anxiety disorder) 05/29/2024   Acute osteomyelitis of left clavicle (HCC) 05/15/2024   Hardware complicating wound infection (HCC) 05/15/2024   Infection 05/15/2024   Displaced fracture of shaft of left clavicle, initial encounter for closed fracture 03/12/2024    Closed fracture of right fibula and tibia 11/13/2022   Traumatic closed displaced fracture of right tibial plafond with fibula 11/12/2022   Depression 11/12/2022   Chronic pain syndrome 11/12/2022   Stage 3a chronic kidney disease (HCC)    Sinus tachycardia 08/07/2018   Constipation due to pain medication therapy 08/06/2018   Paralytic ileus (HCC) 07/24/2018   Acute kidney injury superimposed on stage 3a chronic kidney disease (HCC)    Fatty liver 07/23/2018   Malnutrition of moderate degree 07/21/2018   Hypertensive urgency 07/20/2018   Nausea and vomiting 07/20/2018   ARF (acute renal failure) (HCC) 07/19/2018   Vocal cord dysfunction 04/25/2017   PTSD (post-traumatic stress disorder) 04/25/2017   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   Acute respiratory failure with hypoxia (HCC) 04/22/2017   Hypokalemia 04/22/2017   Leukocytosis 04/22/2017   QT prolongation 04/22/2017   CFIDS (chronic fatigue and immune dysfunction syndrome) (HCC) 08/20/2015   Foot pain 08/20/2015   Essential hypertension 08/20/2015   COPD (chronic obstructive pulmonary disease) (HCC) 08/20/2015   Obstructive apnea 08/20/2015   Cervical osteoarthritis 08/20/2015   Sleep disorder 08/20/2015   Other specified postprocedural states 07/17/2015   Borderline personality disorder (HCC) 06/11/2015     Problem List Items Addressed This Visit   None    I am having Kynsley A. Aaronson maintain her morphine , nystatin , acyclovir  ointment, prazosin , Immune Globulin 10%, QUEtiapine , oxyCODONE -acetaminophen , promethazine , atorvastatin , EPINEPHrine , DULoxetine , gabapentin , hydrOXYzine , lisinopril , pantoprazole , albuterol , OXYGEN , albuterol , tiZANidine , diazepam , cephALEXin, lamoTRIgine , montelukast , cefTRIAXone , daptomycin , ibuprofen, Wixela Inhub, meloxicam , and cyanocobalamin .   No orders of the defined types were placed in this encounter.    Follow-up: No follow-ups on file. or sooner if needed.   Cathlyn July,  MSN, FNP-C Nurse Practitioner [facility] Main number: [phone]

## 2024-06-12 ENCOUNTER — Telehealth: Payer: Self-pay

## 2024-06-12 ENCOUNTER — Inpatient Hospital Stay: Admitting: Family

## 2024-06-12 NOTE — Telephone Encounter (Signed)
 Called patient to rescheduled missed appointment, no answer left vm to contact clinic.  Per Dr. Harden note patient picc line was removed in is office on 7/1.

## 2024-06-18 DIAGNOSIS — R918 Other nonspecific abnormal finding of lung field: Secondary | ICD-10-CM | POA: Diagnosis not present

## 2024-06-18 DIAGNOSIS — M898X8 Other specified disorders of bone, other site: Secondary | ICD-10-CM | POA: Diagnosis not present

## 2024-06-19 DIAGNOSIS — D801 Nonfamilial hypogammaglobulinemia: Secondary | ICD-10-CM | POA: Diagnosis not present

## 2024-06-27 DIAGNOSIS — J449 Chronic obstructive pulmonary disease, unspecified: Secondary | ICD-10-CM | POA: Diagnosis not present

## 2024-07-02 DIAGNOSIS — D839 Common variable immunodeficiency, unspecified: Secondary | ICD-10-CM | POA: Diagnosis not present

## 2024-07-02 DIAGNOSIS — R918 Other nonspecific abnormal finding of lung field: Secondary | ICD-10-CM | POA: Diagnosis not present

## 2024-07-02 DIAGNOSIS — M869 Osteomyelitis, unspecified: Secondary | ICD-10-CM | POA: Diagnosis not present

## 2024-07-02 DIAGNOSIS — J4489 Other specified chronic obstructive pulmonary disease: Secondary | ICD-10-CM | POA: Diagnosis not present

## 2024-07-02 DIAGNOSIS — M5417 Radiculopathy, lumbosacral region: Secondary | ICD-10-CM | POA: Diagnosis not present

## 2024-07-02 DIAGNOSIS — G4733 Obstructive sleep apnea (adult) (pediatric): Secondary | ICD-10-CM | POA: Diagnosis not present

## 2024-07-02 DIAGNOSIS — M5030 Other cervical disc degeneration, unspecified cervical region: Secondary | ICD-10-CM | POA: Diagnosis not present

## 2024-07-02 DIAGNOSIS — J4540 Moderate persistent asthma, uncomplicated: Secondary | ICD-10-CM | POA: Diagnosis not present

## 2024-07-02 DIAGNOSIS — J301 Allergic rhinitis due to pollen: Secondary | ICD-10-CM | POA: Diagnosis not present

## 2024-07-02 DIAGNOSIS — M5136 Other intervertebral disc degeneration, lumbar region with discogenic back pain only: Secondary | ICD-10-CM | POA: Diagnosis not present

## 2024-07-02 DIAGNOSIS — G894 Chronic pain syndrome: Secondary | ICD-10-CM | POA: Diagnosis not present

## 2024-07-02 DIAGNOSIS — I10 Essential (primary) hypertension: Secondary | ICD-10-CM | POA: Diagnosis not present

## 2024-07-02 DIAGNOSIS — Z79891 Long term (current) use of opiate analgesic: Secondary | ICD-10-CM | POA: Diagnosis not present

## 2024-07-16 DIAGNOSIS — T847XXA Infection and inflammatory reaction due to other internal orthopedic prosthetic devices, implants and grafts, initial encounter: Secondary | ICD-10-CM | POA: Diagnosis not present

## 2024-07-16 DIAGNOSIS — K760 Fatty (change of) liver, not elsewhere classified: Secondary | ICD-10-CM | POA: Diagnosis not present

## 2024-07-16 DIAGNOSIS — I129 Hypertensive chronic kidney disease with stage 1 through stage 4 chronic kidney disease, or unspecified chronic kidney disease: Secondary | ICD-10-CM | POA: Diagnosis not present

## 2024-07-16 DIAGNOSIS — M797 Fibromyalgia: Secondary | ICD-10-CM | POA: Diagnosis not present

## 2024-07-16 DIAGNOSIS — J9611 Chronic respiratory failure with hypoxia: Secondary | ICD-10-CM | POA: Diagnosis not present

## 2024-07-16 DIAGNOSIS — I959 Hypotension, unspecified: Secondary | ICD-10-CM | POA: Diagnosis not present

## 2024-07-16 DIAGNOSIS — G4733 Obstructive sleep apnea (adult) (pediatric): Secondary | ICD-10-CM | POA: Diagnosis not present

## 2024-07-16 DIAGNOSIS — Z452 Encounter for adjustment and management of vascular access device: Secondary | ICD-10-CM | POA: Diagnosis not present

## 2024-07-16 DIAGNOSIS — K5903 Drug induced constipation: Secondary | ICD-10-CM | POA: Diagnosis not present

## 2024-07-16 DIAGNOSIS — J4489 Other specified chronic obstructive pulmonary disease: Secondary | ICD-10-CM | POA: Diagnosis not present

## 2024-07-16 DIAGNOSIS — S42022D Displaced fracture of shaft of left clavicle, subsequent encounter for fracture with routine healing: Secondary | ICD-10-CM | POA: Diagnosis not present

## 2024-07-16 DIAGNOSIS — N179 Acute kidney failure, unspecified: Secondary | ICD-10-CM | POA: Diagnosis not present

## 2024-07-16 DIAGNOSIS — E440 Moderate protein-calorie malnutrition: Secondary | ICD-10-CM | POA: Diagnosis not present

## 2024-07-16 DIAGNOSIS — K219 Gastro-esophageal reflux disease without esophagitis: Secondary | ICD-10-CM | POA: Diagnosis not present

## 2024-07-16 DIAGNOSIS — G894 Chronic pain syndrome: Secondary | ICD-10-CM | POA: Diagnosis not present

## 2024-07-16 DIAGNOSIS — M1990 Unspecified osteoarthritis, unspecified site: Secondary | ICD-10-CM | POA: Diagnosis not present

## 2024-07-16 DIAGNOSIS — E785 Hyperlipidemia, unspecified: Secondary | ICD-10-CM | POA: Diagnosis not present

## 2024-07-16 DIAGNOSIS — A419 Sepsis, unspecified organism: Secondary | ICD-10-CM | POA: Diagnosis not present

## 2024-07-16 DIAGNOSIS — M86112 Other acute osteomyelitis, left shoulder: Secondary | ICD-10-CM | POA: Diagnosis not present

## 2024-07-16 DIAGNOSIS — G629 Polyneuropathy, unspecified: Secondary | ICD-10-CM | POA: Diagnosis not present

## 2024-07-16 DIAGNOSIS — N1832 Chronic kidney disease, stage 3b: Secondary | ICD-10-CM | POA: Diagnosis not present

## 2024-07-20 DIAGNOSIS — D801 Nonfamilial hypogammaglobulinemia: Secondary | ICD-10-CM | POA: Diagnosis not present

## 2024-07-23 ENCOUNTER — Ambulatory Visit: Admitting: Orthopedic Surgery

## 2024-07-28 DIAGNOSIS — J449 Chronic obstructive pulmonary disease, unspecified: Secondary | ICD-10-CM | POA: Diagnosis not present

## 2024-08-14 DIAGNOSIS — D801 Nonfamilial hypogammaglobulinemia: Secondary | ICD-10-CM | POA: Diagnosis not present

## 2024-08-27 DIAGNOSIS — G4733 Obstructive sleep apnea (adult) (pediatric): Secondary | ICD-10-CM | POA: Diagnosis not present

## 2024-08-30 DIAGNOSIS — J4540 Moderate persistent asthma, uncomplicated: Secondary | ICD-10-CM | POA: Diagnosis not present

## 2024-08-30 DIAGNOSIS — R7303 Prediabetes: Secondary | ICD-10-CM | POA: Diagnosis not present

## 2024-08-30 DIAGNOSIS — R079 Chest pain, unspecified: Secondary | ICD-10-CM | POA: Diagnosis not present

## 2024-08-30 DIAGNOSIS — I10 Essential (primary) hypertension: Secondary | ICD-10-CM | POA: Diagnosis not present

## 2024-08-30 DIAGNOSIS — J449 Chronic obstructive pulmonary disease, unspecified: Secondary | ICD-10-CM | POA: Diagnosis not present

## 2024-09-09 DIAGNOSIS — D801 Nonfamilial hypogammaglobulinemia: Secondary | ICD-10-CM | POA: Diagnosis not present

## 2024-09-26 DIAGNOSIS — M5136 Other intervertebral disc degeneration, lumbar region with discogenic back pain only: Secondary | ICD-10-CM | POA: Diagnosis not present

## 2024-09-26 DIAGNOSIS — G894 Chronic pain syndrome: Secondary | ICD-10-CM | POA: Diagnosis not present

## 2024-09-26 DIAGNOSIS — Z79891 Long term (current) use of opiate analgesic: Secondary | ICD-10-CM | POA: Diagnosis not present

## 2024-09-26 DIAGNOSIS — M5030 Other cervical disc degeneration, unspecified cervical region: Secondary | ICD-10-CM | POA: Diagnosis not present

## 2024-09-27 DIAGNOSIS — J449 Chronic obstructive pulmonary disease, unspecified: Secondary | ICD-10-CM | POA: Diagnosis not present

## 2024-10-07 DIAGNOSIS — D801 Nonfamilial hypogammaglobulinemia: Secondary | ICD-10-CM | POA: Diagnosis not present
